# Patient Record
Sex: Male | Born: 1955 | Race: White | Hispanic: No | Marital: Married | Smoking: Current every day smoker
Health system: Southern US, Community
[De-identification: ages and names within clinical notes are randomized; demographics above are authoritative.]

## PROBLEM LIST (undated history)

## (undated) DIAGNOSIS — M869 Osteomyelitis, unspecified: Secondary | ICD-10-CM

## (undated) DIAGNOSIS — I10 Essential (primary) hypertension: Secondary | ICD-10-CM

## (undated) DIAGNOSIS — M549 Dorsalgia, unspecified: Secondary | ICD-10-CM

## (undated) DIAGNOSIS — R51 Headache: Secondary | ICD-10-CM

## (undated) DIAGNOSIS — E785 Hyperlipidemia, unspecified: Secondary | ICD-10-CM

## (undated) HISTORY — DX: Hyperlipidemia, unspecified: E78.5

## (undated) HISTORY — PX: SPINE SURGERY: SHX786

## (undated) HISTORY — PX: CARDIAC CATHETERIZATION: SHX172

---

## 2000-04-19 ENCOUNTER — Encounter: Admission: RE | Admit: 2000-04-19 | Discharge: 2000-07-18 | Payer: Self-pay | Admitting: Emergency Medicine

## 2003-03-21 ENCOUNTER — Encounter: Payer: Self-pay | Admitting: Emergency Medicine

## 2003-03-22 ENCOUNTER — Inpatient Hospital Stay (HOSPITAL_COMMUNITY): Admission: EM | Admit: 2003-03-22 | Discharge: 2003-03-25 | Payer: Self-pay | Admitting: Emergency Medicine

## 2003-03-31 ENCOUNTER — Observation Stay (HOSPITAL_COMMUNITY): Admission: EM | Admit: 2003-03-31 | Discharge: 2003-04-01 | Payer: Self-pay | Admitting: Emergency Medicine

## 2003-03-31 ENCOUNTER — Encounter: Payer: Self-pay | Admitting: Emergency Medicine

## 2003-10-06 ENCOUNTER — Emergency Department (HOSPITAL_COMMUNITY): Admission: EM | Admit: 2003-10-06 | Discharge: 2003-10-06 | Payer: Self-pay | Admitting: Emergency Medicine

## 2003-10-24 ENCOUNTER — Emergency Department (HOSPITAL_COMMUNITY): Admission: EM | Admit: 2003-10-24 | Discharge: 2003-10-25 | Payer: Self-pay | Admitting: Emergency Medicine

## 2003-12-09 ENCOUNTER — Emergency Department (HOSPITAL_COMMUNITY): Admission: EM | Admit: 2003-12-09 | Discharge: 2003-12-09 | Payer: Self-pay | Admitting: Emergency Medicine

## 2004-07-10 ENCOUNTER — Emergency Department (HOSPITAL_COMMUNITY): Admission: EM | Admit: 2004-07-10 | Discharge: 2004-07-10 | Payer: Self-pay | Admitting: Emergency Medicine

## 2004-08-23 ENCOUNTER — Encounter: Admission: RE | Admit: 2004-08-23 | Discharge: 2004-08-23 | Payer: Self-pay | Admitting: Emergency Medicine

## 2004-11-18 ENCOUNTER — Emergency Department (HOSPITAL_COMMUNITY): Admission: EM | Admit: 2004-11-18 | Discharge: 2004-11-18 | Payer: Self-pay | Admitting: Emergency Medicine

## 2005-01-04 ENCOUNTER — Emergency Department (HOSPITAL_COMMUNITY): Admission: EM | Admit: 2005-01-04 | Discharge: 2005-01-04 | Payer: Self-pay | Admitting: Emergency Medicine

## 2005-04-21 ENCOUNTER — Encounter: Admission: RE | Admit: 2005-04-21 | Discharge: 2005-04-21 | Payer: Self-pay | Admitting: Emergency Medicine

## 2005-07-05 ENCOUNTER — Emergency Department (HOSPITAL_COMMUNITY): Admission: EM | Admit: 2005-07-05 | Discharge: 2005-07-05 | Payer: Self-pay | Admitting: Emergency Medicine

## 2005-07-05 ENCOUNTER — Ambulatory Visit: Payer: Self-pay | Admitting: Psychiatry

## 2005-07-05 ENCOUNTER — Inpatient Hospital Stay (HOSPITAL_COMMUNITY): Admission: RE | Admit: 2005-07-05 | Discharge: 2005-07-08 | Payer: Self-pay | Admitting: Psychiatry

## 2007-11-24 ENCOUNTER — Emergency Department (HOSPITAL_COMMUNITY): Admission: EM | Admit: 2007-11-24 | Discharge: 2007-11-24 | Payer: Self-pay | Admitting: Emergency Medicine

## 2007-12-03 ENCOUNTER — Ambulatory Visit (HOSPITAL_COMMUNITY): Admission: RE | Admit: 2007-12-03 | Discharge: 2007-12-03 | Payer: Self-pay | Admitting: Neurosurgery

## 2008-08-17 ENCOUNTER — Encounter: Admission: RE | Admit: 2008-08-17 | Discharge: 2008-08-17 | Payer: Self-pay | Admitting: Neurosurgery

## 2008-08-19 ENCOUNTER — Encounter: Admission: RE | Admit: 2008-08-19 | Discharge: 2008-08-19 | Payer: Self-pay | Admitting: Neurosurgery

## 2008-09-03 ENCOUNTER — Emergency Department (HOSPITAL_BASED_OUTPATIENT_CLINIC_OR_DEPARTMENT_OTHER): Admission: EM | Admit: 2008-09-03 | Discharge: 2008-09-03 | Payer: Self-pay | Admitting: Emergency Medicine

## 2009-04-23 ENCOUNTER — Encounter: Admission: RE | Admit: 2009-04-23 | Discharge: 2009-04-23 | Payer: Self-pay | Admitting: Neurosurgery

## 2011-04-05 NOTE — Op Note (Signed)
NAMEASSER, DAIL                 ACCOUNT NO.:  [id number]   MEDICAL RECORD NO.:  [id number]          PATIENT TYPE:  OIB   LOCATION:  3528                         FACILITY:  [facility]   PHYSICIAN:  Kary Kos, M.D.        DATE OF BIRTH:  1956/01/27   DATE OF PROCEDURE:  12/03/2007  DATE OF DISCHARGE:                               OPERATIVE REPORT   PREOPERATIVE DIAGNOSIS:  Right S1 radiculopathy from ruptured disk L5-S1  right.   PROCEDURE:  Lumbar laminectomy, microdiskectomy L5-S1.  Microscope  dissection of the right S1 nerve root.   SURGEON:  Kary Kos, M.D.   ASSISTANT:  Eustace Moore, MD.   ANESTHESIA:  General endotracheal.   HISTORY OF PRESENT ILLNESS:  The patient is a 55 year old gentleman who  presented with progress worsening back and right leg pain radiating down  the back of his foot to __________  the bottom of his foot __________  MRI scan showed a large ruptured disk L5-S1 causing severe spinal  stenosis and predominantly rightward compression although a large part  of the disk was central.  The patient denied any left leg pain and  failed all forms of conservative treatment.  It was recommended  laminectomy microdiskectomy.  Risks and benefits of operation were  explained to patient who understands and agreed to proceed forward.  The  patient was brought to the operating room was induced under anesthesia,  placed prone on Wilson frame.  The back was prepped and draped usual  sterile fashion.  Preop x-ray used to localize appropriate level.  After  infiltrating __________  lidocaine with epinephrine midline incision  made, Bovie electrocautery used to dissect down.  Subperiosteal  dissection carried out __________  lamina on the right at L5 and S1.  Intraoperative x-ray confirmed localization of the transitional level so  attention was taken one interspace above this  and then using high speed  drill the inferior aspect lamina L5, medial facet complex, superior  aspect of S1 was drilled down and then the 2 and 3 mm Kerrison punches  were used to extend the laminotomy and __________  ligamentum flavum  exposing thecal sac.  At this point the operating microscope was prepped  and draped brought in for microscopic illumination and ligamentum flavum  was removed.  The undersurface of lateral gutter was underbitten  identifying the right S1 nerve root.  Then using 4 Penfield the  __________  very large disk herniation stilled contained in ligament.  This small stab incision was made atop the ligament and a large free  fragment of disk immediately expressed under pressure and delivered  itself up into the laminotomy defect.  This came out in three large  pieces, this immediately removal of third piece thecal sac centrally and  the S1 nerve root was decompressed.  The annulotomy was then extended.  Interspace was appropriately cleaned out with pituitary rongeurs.  Epstein curette was used to scrape the central disk down.  Several more  fragments removed from __________  compartment and at the end of  diskectomy __________  thecal  sac and right S1 nerve root.  Wound was  copiously irrigated and meticulous hemostasis was maintained.  Gelfoam  was overlaid on top of  dura and muscle and fascia reapproximated with __________  interrupted  Vicryl and skin closed with running 4-0 subcuticular.  Benzoin and Steri-  Strips applied.  Patient went to recovery room in stable condition.  End  of case, needle and sponge count correct.           ______________________________  Kary Kos, M.D.     GC/MEDQ  D:  12/03/2007  T:  12/03/2007  Job:  [id number]

## 2011-04-08 NOTE — Discharge Summary (Signed)
NAME:  Fernando Anthony, Fernando Anthony NO.:  [id number]   MEDICAL RECORD NO.:  [id number]                   PATIENT TYPE:  INP   LOCATION:  2018                                 FACILITY:  [facility]   PHYSICIAN:  Dola Argyle, M.D.                  DATE OF BIRTH:  December 07, 1955   DATE OF ADMISSION:  03/22/2003  DATE OF DISCHARGE:  03/25/2003                           DISCHARGE SUMMARY - REFERRING   DISCHARGE DIAGNOSES:  1. Noncritical coronary artery disease.     a. Reason for hospitalization: Chest pain.     b. Cardiac catheterization this admission revealing left main normal,        left anterior descending artery normal with a tiny first diagonal        branch with 70 to 80% ostial narrowing in the LAO caudal view only,        circumflex free of critical disease, right coronary artery normal.     c. Left ventriculogram revealed mild hypokinesis with probable left        ventricular hypertrophy and ejection fraction 54%.  No significant        mitral regurgitation.  2. Diabetes mellitus, type 1.  3. 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  4. Strong family history for coronary artery disease.  5. Tobacco abuse.   PROCEDURES:  Cardiac catheterization by Loretha Brasil. Lia Foyer, M.D. (see results  noted above.)   HOSPITAL COURSE:  This 55 year old patient of Dr. Jake Michaelis was admitted  through the emergency room at [facility] on 03/22/2003 in the early  morning hours for complaints of chest pain.  The patient had multiple  cardiac risk factors including diabetes, smoking, and strong family history  for coronary artery disease.  He was admitted and placed on aspirin,  Lovenox, beta blocker, and nitrates.  He ruled out for myocardial  infarction.  He remained stable throughout the weekend.  He went for cardiac  catheterization on the morning of 03/24/2003.  Please see Dr. Maren Beach  dictated note for complete details.  Results are noted above.  He tolerated  procedure  well and had no immediate complications.   A 2-D echocardiogram was ordered to further assess possible LVH.  This study  was performed just prior to discharge, and the results are pending at the  time of this dictation.   The patient's hemoglobin A1C returned markedly elevated at 10.4.  His  triglyceride level was 705 on his lipid panel.  Total cholesterol was 187,  HDL 28, and the LDL was not calculated secondary to the high triglycerides  level.  Dr. Lia Foyer saw the patient on 03/25/2003 and felt he could be discharged to  home.   The patient had been started on Lopressor 25 mg twice daily upon admission.  Dr. Lia Foyer did try to put the patient on Altace, but Altace had to be held  secondary to low blood pressure.  As an outpatient, if the patient's blood  pressure will allow, Altace can be restarted.   The patient has been counseled by diabetes coordinator at [facility].  They will try to set him up with further outpatient diabetes  education.  The patient's Glucophage will be restarted on May 5.  He has  been instructed to follow up with his primary care physician soon for  further management of diabetes as well as close followup on triglyceride  level.   LABORATORY DATA:  White count 8000, hemoglobin 15.6, hematocrit 45.5,  platelet count 163,000.  D-dimer less than 0.22.  INR at admission 0.9.  Sodium 139, potassium 4.3, chloride 103, CO2 29, glucose 186, BUN 12,  creatinine 0.9.  Total bilirubin 0.5, alkaline phosphatase 58, AST 23, ALT  30, total protein 6.8, albumin 4, calcium 9.  Cardiac enzymes negative x 3.  Hemoglobin A1C and lipid panel as noted above.  TSH 2.240.   Admission chest x-ray reportedly normal per the notes.   DISCHARGE MEDICATIONS:  1. Lopressor 25 mg twice daily.  2. Coated aspirin 325 mg twice daily.  3. Glucophage 1000 mg twice daily, to be restarted on Wednesday, May 5.  4. Glipizide 10 mg 2 tablets daily.  5. Xanax 0.5 mg 1/2 to 1 tablet  3 times a day as needed.  6. Nitroglycerin p.r.n. chest pain.  7. Lipitor 20 mg q.h.s.   1. Pain management:  Tylenol as needed for groin pain.  2. Nitroglycerin as needed for chest pain.  The patient is to call 911 or     our office with recurrent chest pain.   ACTIVITY:  No driving, heavy lifting, exertion, return to work for two days.  Slowly advance as tolerated.   DIET:  Low-fat, low-sodium, diabetic diet.   WOUND CARE:  The patient is to call our office in [facility] for any  concerns over groin swelling, bleeding, or bruising.   SPECIAL INSTRUCTIONS:  The patient has been advised to stop smoking, and he  is willing to try this.  He will be using Xanax to help with this.   FOLLOW UP:  The patient has been advised he needs close followup for his  blood sugars and his hypertriglyceridemia.  He has been asked to follow up  with Dr. Jake Michaelis later this week or early next week.  He will follow up with  Dr. Ron Parker on Friday, May 21, at 2:30 p.m. in our office in [place].     Richardson Dopp, P.A.                        Dola Argyle, M.D.    SW/MEDQ  D:  03/25/2003  T:  03/25/2003  Job:  [id number]   cc:   Harden Mo, M.D.  [address]  [place]  [place] [address]  Fax: [phone]

## 2011-04-08 NOTE — Discharge Summary (Signed)
Fernando Anthony, Fernando Anthony NO.:  [id number]   MEDICAL RECORD NO.:  [id number]          PATIENT TYPE:  IPS   LOCATION:  0304                          FACILITY:  [facility]   PHYSICIAN:  Carlton Adam, M.D.      DATE OF BIRTH:  1956-08-18   DATE OF ADMISSION:  07/05/2005  DATE OF DISCHARGE:  07/08/2005                                 DISCHARGE SUMMARY   CHIEF COMPLAINT/HISTORY OF PRESENT ILLNESS:  This was the first admission to  [facility] for this 55 year old married white male  voluntarily admitted.  He was all right for 6 months after detox at [facility] on  November 2005.  He slowly returned to drinking, thinking he could control  it.  The drinking has gradually escalated.  It has been out of control for  the last 3 months, drinking a lot of beer or a fifth of liquor daily.  He  tried to stop, got shakes and got agitated, diarrhea and vomiting, some  blackouts last August 15.   PAST PSYCHIATRIC HISTORY:  First time in [facility] with prior  detox at [place] in November 2005.   ALCOHOL AND DRUG HISTORY:  As already stated.  Persistent use of alcohol  after his last relapse, escalating in the last 3 months.   PAST MEDICAL HISTORY:  Type 2 diabetes mellitus, uncontrolled.   MEDICATIONS:  1.  Xanax 0.5 three times a day.  2.  Metformin.  3.  Glucophage.  4.  Lisinopril.  5.  Previously on Effexor but was not taking.   PHYSICAL EXAMINATION:  Performed and failed to show any acute findings.   LABORATORY WORKUP:  Blood chemistry, glucose 163.  Liver enzymes, SGOT 20,  SGPT 24.  Hemoglobin A1c 13 and TSH was 1.984.   MENTAL STATUS EXAM:  Reveals an alert, cooperative male, pleasant, flushed.  Speech normal rate, tempo and production.  Mood euthymia.  Affect broad.  Thought process logical, coherent and relevant.  No suicidal ideas, no  homicidal ideas, no hallucinations, no delusions.  Cognition well preserved.   ADMISSION DIAGNOSES:  AXIS I:   Alcohol dependence.  AXIS II:  No diagnosis.  AXIS III:  Type 2 diabetes mellitus, uncontrolled.  AXIS IV:  Moderate.  AXIS V:  Upon admission 35, highest global assessment of function in the  last year 65-70.   COURSE IN HOSPITAL:  Patient was admitted.  He was started in individual and  group psychotherapy.  His medications were resumed.  He was detoxified with  Librium.  He was placed on Glipizide ER 10 mg twice a day and Metformin 500  mg 2 in the morning and 1 at night.  Avandia 4 mg daily.  Due to his  persistently uncontrolled diabetes and in communication with his primary  physician, he was started on insulin.  He did endorse that he had a hard  time with alcohol use.  Endorsed trying to control his drinking but was not  able to do so.  His alcohol use escalated out of control, craving, coping  with his  wife.  Also aware of the effect of his drinking on his daughter.  They fight a lot in front of her.  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  Wife had to go to work when all these years had been a stay-  home mom.  He lost his role as the [employer].  Endorsed he craves alcohol.  He was willing to start Revia which was started at 25 mg per day.  He was  still pretty much saying that he thought he could do it by just going to AA,  by getting a sponsor.  He endorsed that this has worked for him before.  He  is still dealing with the other losses he has had.  Family session with the  wife.  Her concerns were noticed, and he heard her.  On July 08, 2005, he  was in full contact with reality.  There were no homicidal ideas, no  suicidal ideas, no hallucination, no delusions.  He was fully detoxed.  He  was considering coming to [facility], but he would go to AA and he would pursue  the Revia.  Overall his mood was improved.   Committed to abstinence.  He  understanding the effect that alcohol had not only on his emotional and  spiritual but also physical health, as well as the effect that it had on his  family.   DISCHARGE DIAGNOSES:  AXIS I:  Alcohol dependence.  Depressive disorder, not  otherwise specified.  AXIS III:  No diagnosis.  AXIS III:  Type 2 diabetes mellitus.  AXIS IV:  Moderate.  AXIS V:  Upon discharge global assessment of function 55.   DISCHARGE MEDICATIONS:  1.  Librium 25 mg 1 at 6:00 p.m. August 18, then 1 at 9:00 on August 19,      then discontinue.  2.  Avandia 4 mg twice a day.  3.  Glucophage 500 mg 3 twice a day.  4.  Glucotrol XL 10 mg twice a day.  5.  Prinivil 10 mg daily.  6.  Revia 50 mg daily.  7.  Lantus insulin 20 units at bedtime.   FOLLOW UP:  Dr. Jake Michaelis and [facility], as well as going to [employer]  and get a sponsor.      Carlton Adam, M.D.  Electronically Signed     IL/MEDQ  D:  08/09/2005  T:  08/10/2005  Job:  [id number]

## 2011-04-08 NOTE — Discharge Summary (Signed)
NAME:  Fernando Anthony, Fernando Anthony                           ACCOUNT NO.:  [id number]   MEDICAL RECORD NO.:  [id number]                   PATIENT TYPE:  OBV   LOCATION:  [place]                                 FACILITY:  [facility]   PHYSICIAN:  Loretha Brasil. Lia Foyer, M.D.             DATE OF BIRTH:  [birth year]   DATE OF ADMISSION:  03/31/2003  DATE OF DISCHARGE:  04/01/2003                           DISCHARGE SUMMARY - REFERRING   BRIEF HISTORY:  This is a 55 year old male followed by Dr. Dola Argyle in  our practice with a history of metabolic syndrome recently discharged from  [facility] on 03/25/03 following an admission for chest pain. The  patient presented again on 03/31/03 with recurrent chest pain. He was  initially admitted on 03/22/03 with chest pain. He had mild EKG changes. He  ruled out for a MI. The patient has multiple cardiac risk factors. He  eventually underwent cardiac catheterization on 03/24/03 performed by Dr.  Lia Foyer. This revealed an ejection fraction of 54% with mild hypokinesis and  LVH. The left main coronary artery was normal. The LAD was normal. There was  a tiny diagonal that had a 70 to 80% ostial lesion seen only in one view.  The left circumflex was normal. The RCA was normal. Medical therapy was  recommended with aggressive risk factor management including smoking  cessation. Altace was started but had to be held secondary to hypotension.   Prior to this most recent admission, the patient had done well without  recurrent pain until approximately 11 p.m. on the night of admission when he  developed sudden onset of left sided chest pain while watching television.  He presented to the emergency room and was admitted for further evaluation.   PAST MEDICAL HISTORY:  1. Significant for chest pain.  2. Diabetes mellitus poorly controlled. Hemoglobin A1c that was 10.4.  3. He has a history of hypertriglyceridemia.  4. History of hypertension.  5. History of tobacco abuse.  6. History of anxiety.   ALLERGIES:  No known drug allergies.   SOCIAL HISTORY:  The patient works as a Secondary school teacher. He lives in [place]  with his wife. He smokes one pack of cigarettes per day. He drinks two to  four beers per day.   FAMILY HISTORY:  The patient's father has an extensive history of coronary  artery disease.   HOSPITAL COURSE:  As noted, this patient was readmitted to [facility] with recurrent chest pain on Mar 31, 2003 through the emergency  department. His cardiac enzymes were found to be negative. He was scheduled  for exercise Cardiolite that was performed on 04/01/03. The Cardiolite was  interrupted as negative for ischemia with an ejection fraction of 58%. This  was discussed with Dr. Lia Foyer who had seen the patient during his stay, and  arrangements were made to discharge the patient in stable and improved  condition with plans  for outpatient followup with Dr. Ron Parker.   LABORATORY DATA:  A CBC on May 11 revealed a hemoglobin of 14, hematocrit  41.6, WBCs 8.5 thousand, platelets 161,000. A chemistry profile on 5/10  revealed BUN 8, creatinine 0.7, potassium 3.7, glucose 132. A PTT was 76, an  INR was 1.0. Cardiac enzymes were negative x3. A D-dimer was 0.26. A lipid  profile revealed cholesterol 152, triglycerides 358, HDL 38, LDL was 42.  Hepatitis C antibody was negative. Stool was negative for blood. Chest x-ray  showed no active disease although there was suboptimal inspiration.   DISCHARGE MEDICATIONS:  The patient was discharged on the same medications  he was on prior to admission. These included:  1.  Lopressor 25 mg b.i.d.  1. Aspirin 325 mg daily.  2. Lipitor 20 mg daily.  3. Glucophage 1,000 mg b.i.d.  4. Glipizide 20 mg daily.  5. Xanax p.r.n.  6. Nitroglycerin p.r.n.   The patient's activity is to be as tolerated. He was told he could return to  work. He is to be on a low fat diabetic diet. He is to follow up with Dr.  Ron Parker Friday  May 21 at 4:15. He was to follow up with Dr. Jake Michaelis within the  next several weeks.   PROBLEM LIST AT THE TIME OF DISCHARGE:  1. Chest pain, myocardial infarction ruled out.  2. Negative exercise Cardiolite performed 04/01/03, EF 58%.  3. History of cardiac catheterization performed 03/24/03. Please see results     as noted above. Minimal disease. Medical therapy recommended.  4. Diabetes mellitus.  5. Dyslipidemia.  6. Hypertension.  7. Tobacco abuse.  8. Anxiety.     Mikey Bussing, P.A. [facility]                  Thomas D. Lia Foyer, M.D.    DR/MEDQ  D:  04/01/2003  T:  04/02/2003  Job:  [id number]   cc:   Harden Mo, M.D.  [address]  [place]  [place] [address]  Fax: [phone]

## 2011-04-08 NOTE — Cardiovascular Report (Signed)
NAME:  Fernando Anthony, Fernando Anthony                           ACCOUNT NO.:  [id number]   MEDICAL RECORD NO.:  [id number]                   PATIENT TYPE:  INP   LOCATION:  2018                                 FACILITY:  [facility]   PHYSICIAN:  Loretha Brasil. Lia Foyer, M.D.             DATE OF BIRTH:  06/13/1956   DATE OF PROCEDURE:  03/24/2003  DATE OF DISCHARGE:                              CARDIAC CATHETERIZATION   INDICATIONS FOR PROCEDURE:  The patient is a 55 year old gentleman who has  diabetes, he also has hypertension.  He has not been seen in about 2-1/2  years, but recently presented with Dr. Jake Michaelis, and these problems have been  put under careful management.  He presented with chest pain with some mild  electrocardiographic abnormalities.  He has been subsequently referred for  cardiac catheterization.  Also, triglycerides are noted to be 705, and HDL  28.   LABORATORY DATA:  Enzymes were negative for myocardial infarction.  Hemoglobin A1C is 10.4.   PROCEDURE:  1. Left heart catheterization.  2. Selective coronary arteriography.  3. Selective left ventriculography.   DESCRIPTION OF PROCEDURE:  The procedure was performed from the right  femoral artery using 6 French catheters.  The patient tolerated the  procedure well.  There were no complications.  He was taken to the holding  area in satisfactory clinical condition.   HEMODYNAMIC DATA:  1. The central aortic pressure was 140/87, mean 110.  2. Left ventricle 139/17.  3. No gradient on pullback across the aortic valve.   ANGIOGRAPHIC DATA:  1. Ventriculography was performed the RAO projection.  There was mild global     hypokinesis.  There was probable left ventricular hypertrophy.  Ejection     fraction was calculated at 54%.  There was not significant mitral     regurgitation.  2. The left main coronary artery was free of critical disease.  3. The left anterior descending artery coursed to the apex.  The left     anterior  descending itself appeared to be smooth throughout, wrapped the     apical tip, and provided an inferior wall segment.  There was a large     second diagonal branch.  There was a tiny first diagonal branch that had     about 70 to 80% ostial narrowing seen in the LAO caudal view only.  4. The circumflex provided three marginal branches, all of which appeared to     be free of critical disease.  The vessel terminated as two tiny posterior     lateral branches.  No high-grade areas of focal stenosis were noted in     the circumflex system.  5. The right coronary artery consisted predominately of a single posterior     descending branch with a tiny posterior lateral branch.  The right     coronary artery appeared to be smooth throughout without significant  focal narrowing.   CONCLUSIONS:  1. Mild global hypokinesis with probable left ventricular hypertrophy,     possibly from uncontrolled hypertension.  2. Ostial narrowing of the tiny first diagonal branch with otherwise no     evidence of high-grade obstructive disease.  3. Non-insulin dependent diabetes mellitus.  4. Hypertension.  5. Tobacco use.  6. Dyslipidemia, compatible with a __________ metabolic syndrome.   DISPOSITION:  1. Recommend discontinuation of smoking.  2. Optimization of hemoglobin A1C.  3. Careful attention to hypertension control.  4. Lifestyle counseling.  5. Close followup with his primary care physician.  6. A 2-D echocardiography.  7. Follow up with Dr. Ron Parker.                                               Loretha Brasil. Lia Foyer, M.D.    TDS/MEDQ  D:  03/24/2003  T:  03/24/2003  Job:  [id number]   cc:   Harden Mo, M.D.  [address]  [place]  [place] [address]  Fax: [phone]   Dola Argyle, M.D.   [facility]

## 2011-04-08 NOTE — H&P (Signed)
NAME:  Fernando Anthony, Fernando Anthony NO.:  [id number]   MEDICAL RECORD NO.:  [id number]                   PATIENT TYPE:  OBV   LOCATION:  [id number]                                 FACILITY:  [facility]   PHYSICIAN:  Glori Bickers, M.D. [facility]          DATE OF BIRTH:  1956/02/23   DATE OF ADMISSION:  03/31/2003  DATE OF DISCHARGE:                                HISTORY & PHYSICAL   PRIMARY CARE PHYSICIAN:  Harden Mo, M.D.   CARDIOLOGIST:  Dola Argyle, M.D.   HISTORY OF PRESENT ILLNESS:  The patient is a 55 year old white male with a  history of metabolic syndrome recently discharged from [facility] on Mar 25, 2003, after admission for chest pain who  presents with recurrent chest pain.   He was admitted on Mar 22, 2003, with chest pain and mild EKG changes.  He  ruled out for myocardial infarction.  Given his multiple cardiac risk  factors, he underwent left heart catheterization on Mar 24, 2003, by Dr.  Bing Quarry.  This revealed left ventricular ejection fraction of 54%  with mild global hypokinesis thought secondary to LVH.  His left main was  normal.  His LAD was normal with a tiny D1 which had a 70 to 80 ostial  lesion seen only on one view.  His left circumflex was normal and his RCA  was normal.  It was recommended medical therapy with aggressive risk factor  management including smoking cessation.  Altace was started but had to be  held secondary to hypotension.   For the past few days he had been quite well without recurrent chest pain  and was able to resume all his normal activities.  However, tonight at 11  p.m. he was watching TV when he had the sudden onset of sharp left-sided  chest pain which waxed and waned and he came to the ED.  There, he had no  relief with multiple sublingual nitroglycerin.  EKG and cardiac markers were  negative and his pain was relieved completely with 1 mg of Dilaudid.   REVIEW OF SYSTEMS:  He denies  shortness of breath, abdominal pain, melena,  bright red blood per rectum, fevers, chills, palpitations, or leg swelling.   PAST MEDICAL HISTORY:  1. Chest pain as per HPI.  2. Diabetes poorly controlled with a hemoglobin A1C of 10.4 several days     ago.  3. Hyperlipidemia with total cholesterol of 184, triglycerides 705, and HDL     of 28.  LDL was not able to be calculated given his hypertriglyceridemia.  4. Hypertension.  5. Tobacco abuse.  6. Anxiety.   MEDICATIONS:  1. Lopressor 25 b.i.d.  2. Aspirin 325.  3. Lipitor 20.  4. Glucophage 1000 b.i.d.  5. Glipizide 20 daily.  6. Xanax and nitroglycerin p.r.n.   ALLERGIES:  No known drug allergies.   SOCIAL HISTORY:  He  works as a Insurance account manager homes.  He lives  in [place], [place], with his wife.   HABITS:  Tobacco is one pack per day ongoing.  Alcohol:  He has two to four  beers per day.  He denies any illicit drug use.   FAMILY HISTORY:  His father has an extensive history of CAD and is cared for  by Dr. Bing Quarry.   PHYSICAL EXAMINATION:  GENERAL APPEARANCE:  He is in no acute distress.  VITAL SIGNS:  He is afebrile.  Blood pressure is 107/60 with a heart rate of  80, saturation 98% on room air.  HEENT:  Sclerae are anicteric.  NECK:  Supple.  There is no JVD.  Carotids are 2+ bilaterally without  bruits.  CHEST:  He has mild chest pain on palpation of his anterior chest wall but  this was different from his symptoms that brought him in.  LUNGS:  Clear to auscultation.  CARDIOVASCULAR:  Regular rate and rhythm with normal S1 and S2.  No audible  murmurs, rubs, or gallops.  ABDOMEN:  Benign.  EXTREMITIES:  No clubbing, cyanosis, or edema.  He has a small right groin  hematoma but no bruits.  DPs are 2+ bilaterally.  RECTAL:  He has an empty vault with light brown residue which was sent to  the lab for Hemoccult testing.   LABORATORY DATA:  White count 8.6, hematocrit 45% and platelets  174.  Sodium  140, potassium 3.6, BUN 10, creatinine 0.8, glucose 199.  CK 29, MB 0.6,  troponin I less than 0.01.  PT 13.2, INR 1.0, PTT 31.  Liver panel is normal  except for a mildly elevated ALT of 51.   EKG shows normal sinus rhythm at 98 with a rightward axis and no ischemia.   Chest x-ray shows no acute disease.   ASSESSMENT AND PLAN:  1. This is a 55 year old white male as above with multiple cardiac risk     factors and recent catheterization with minimal coronary artery disease     and a tiny diagonal who presents with atypical chest pain.  I doubt this     pain is cardiac in nature.  However, given his risk factors, will bring     him in for 23-hour observation and rule out myocardial infarction.     Continue on heparin, aspirin, and beta-blocker.  2. Given his increased stress level and alcohol use, it may be reasonable to     consider an esophagogastroduodenoscopy to work-up possible peptic ulcer     disease as the source of his pain.  3. Consider addition of niacin or __________ for his marked     hypertriglyceridemia.  4. Check D-dimer.  5.     Given his elevated albumin, check his BNC panels.  6. Detox prophylaxis given his significant alcohol history.  I stressed to     him the need to stop smoking and limit alcohol use.                                               Glori Bickers, M.D. [facility]    DB/MEDQ  D:  03/31/2003  T:  04/01/2003  Job:  [id number]   cc:   Harden Mo, M.D.  [place] [address]  [place]  [place] [address]  Fax: [phone]   Dola Argyle,  M.D.  

## 2011-05-09 ENCOUNTER — Ambulatory Visit (HOSPITAL_BASED_OUTPATIENT_CLINIC_OR_DEPARTMENT_OTHER): Payer: 59 | Admitting: Physical Medicine & Rehabilitation

## 2011-05-09 ENCOUNTER — Encounter: Payer: 59 | Attending: Physical Medicine & Rehabilitation

## 2011-05-09 DIAGNOSIS — M961 Postlaminectomy syndrome, not elsewhere classified: Secondary | ICD-10-CM

## 2011-05-09 DIAGNOSIS — Z79899 Other long term (current) drug therapy: Secondary | ICD-10-CM | POA: Insufficient documentation

## 2011-05-09 DIAGNOSIS — Z794 Long term (current) use of insulin: Secondary | ICD-10-CM | POA: Insufficient documentation

## 2011-05-09 DIAGNOSIS — IMO0002 Reserved for concepts with insufficient information to code with codable children: Secondary | ICD-10-CM

## 2011-05-09 DIAGNOSIS — M79609 Pain in unspecified limb: Secondary | ICD-10-CM | POA: Insufficient documentation

## 2011-05-09 DIAGNOSIS — E119 Type 2 diabetes mellitus without complications: Secondary | ICD-10-CM | POA: Insufficient documentation

## 2011-05-16 ENCOUNTER — Encounter: Payer: 59 | Admitting: Physical Medicine & Rehabilitation

## 2011-05-19 ENCOUNTER — Encounter: Payer: 59 | Admitting: Physical Medicine & Rehabilitation

## 2011-05-27 NOTE — Consult Note (Signed)
REASON FOR VISIT:  Initial visit for right lower extremity pain.  Consult requested by Dr. Kary Kos, MD  HISTORY:  A 55 year old male with a chief complaint of back and right lower extremity pain onset in 2008.  No trauma.  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  His pain levels at a 5/10; described as sharp, stabbing, constant tingling, aching and the tingling is more in the lower extremity, the sharp and stabbing pain is in the back.  His pain is increasing with activity to a 7.  His pain is relatively better in the morning and progresses during the day.  His sleep is poor related to pain.  His pain is worse with walking, bending, sitting; improves with rest and medications.  When he was taking oxycodone, his pain was improved to the point where he can still do a stretching exercises.  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  He has weakness, numbness, tingling, spasms and constipation on review of systems.  His past history is significant for diabetes as well as hypertension.  His primary care physician, Dr. Elayne Snare.  MEDICATIONS:  Include lisinopril, aspirin, simvastatin, NovoLog and Lantus.  ALLERGIES:  None known.  SOCIAL HISTORY:  Married, lives with his wife who smokes half pack per day.  Alcohol use is 6 beers per month.  FAMILY HISTORY:  Heart disease, lung disease.  PAST SURGICAL HISTORY:  Lumbar laminectomy and microdiskectomy L5-S1 on December 03, 2007.  Well-developed, well-nourished male, in no acute stress.  Orientation x3.  Affect is alert.  Gait is normal.  He has mild problems with toe walk, heel walk on the right side, but cannot complete it without the loss of balance.  He has no evidence of lower extremity muscle atrophy.  He has decreased sensation to pinprick in  the right S1 as well as right L3 dermatomal distribution.  His deep tendon reflexes are decreased bilaterally.  His back has some tenderness in the right PSIS.  His flexion is 50%, extension is 0-25%, but he states it hurts more to bent forward than to bend backwards.  His hip range of motion is full bilaterally.  However, it does cause some back pain on the right side only.  Upper extremity strength, range of motion and sensation are all normal.  Neck range of motion is normal.  IMPRESSION: 1. 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 2. Diabetes.  He has been on Lyrica in the past for this, but was not     particularly helpful for his right lower extremity pain.  RECOMMENDATIONS:  We discussed the multimodal treatment approach including trial of epidural injections of the left S1 transforaminal combined with tramadol 50 mg t.i.d. and nortriptyline 10 mg nightly.  We will try the nonnarcotic approach and avoid narcotic analgesics if possible.  We discussed this with the patient.  He agrees with this plan.  Also, once his pain is under better control, we will need to reinstitute his stretching and exercise program that is PT has given to him in the past. Information given regarding epidural injections.     Charlett Blake, M.D.    AEK/MedQ D:05/09/2011 12:58:29  T:05/10/2011 00:06:06  Job #:  [id number]  cc:   Kary Kos, M.D. Fax:  [phone]  Electronically Signed by Alysia Penna M.D. on 05/27/2011 10:25:45 AM

## 2011-06-20 ENCOUNTER — Encounter: Payer: 59 | Attending: Physical Medicine & Rehabilitation

## 2011-06-20 ENCOUNTER — Encounter (HOSPITAL_BASED_OUTPATIENT_CLINIC_OR_DEPARTMENT_OTHER): Payer: 59 | Admitting: Physical Medicine & Rehabilitation

## 2011-06-20 DIAGNOSIS — M79609 Pain in unspecified limb: Secondary | ICD-10-CM | POA: Insufficient documentation

## 2011-06-20 DIAGNOSIS — M961 Postlaminectomy syndrome, not elsewhere classified: Secondary | ICD-10-CM | POA: Insufficient documentation

## 2011-06-20 DIAGNOSIS — IMO0002 Reserved for concepts with insufficient information to code with codable children: Secondary | ICD-10-CM | POA: Insufficient documentation

## 2011-06-20 DIAGNOSIS — Z794 Long term (current) use of insulin: Secondary | ICD-10-CM | POA: Insufficient documentation

## 2011-06-20 DIAGNOSIS — E119 Type 2 diabetes mellitus without complications: Secondary | ICD-10-CM | POA: Insufficient documentation

## 2011-06-20 DIAGNOSIS — Z79899 Other long term (current) drug therapy: Secondary | ICD-10-CM | POA: Insufficient documentation

## 2011-06-20 NOTE — Procedures (Signed)
Fernando Anthony, Fernando Anthony                 ACCOUNT NO.:  [id number]  MEDICAL RECORD NO.:  [id number]           PATIENT TYPE:  O  LOCATION:  [facility]                          FACILITY:  [facility]  PHYSICIAN:  Charlett Blake, M.D.DATE OF BIRTH:  05-Jun-1956  DATE OF PROCEDURE:  06/20/2011 DATE OF DISCHARGE:                              OPERATIVE REPORT  Right S1 transforaminal epidural steroid injection under fluoroscopic guidance.  PROCEDURE:  Listed above.  CHIEF COMPLAINT:  Pain down the right lower extremity to the foot, has undergone L5-S1 diskectomy, laminectomy with only partial effect.  Pain is interfering with his activity and has not responded well to medication such as Lyrica or nortriptyline.  Informed consent was obtained after describing risks and benefits of the procedure with the patient.  These include bleeding, bruising, and infection.  He elects to proceed and has given written consent.  The patient was placed prone on fluoroscopy table.  Betadine prep, sterile drape, time-out for proper side.  Right side was confirmed.  Area was marked, prepped with Betadine, entered with a 25-gauge inch half needle to anesthetize skin and subcu tissue with 2 mL of 1% lidocaine.  Then, a 22-gauge three and half spinal needle was inserted in the right S1 foramen under AP and lateral imaging.  Omnipaque 180 x2 mL demonstrated good epidural spread followed by injection of 1 mL of 10 mg/mL dexamethasone and 2 mL of 1% MPF lidocaine.  The patient tolerated the procedure well.  Postprocedure instructions given.     Charlett Blake, M.D. Electronically Signed    AEK/MEDQ  D:  06/20/2011 14:24:51  T:  06/20/2011 17:00:36  Job:  [id number]

## 2011-07-19 ENCOUNTER — Ambulatory Visit: Payer: 59 | Admitting: Physical Medicine & Rehabilitation

## 2011-07-19 ENCOUNTER — Encounter: Payer: 59 | Attending: Physical Medicine & Rehabilitation

## 2011-07-19 DIAGNOSIS — Z79899 Other long term (current) drug therapy: Secondary | ICD-10-CM | POA: Insufficient documentation

## 2011-07-19 DIAGNOSIS — Z794 Long term (current) use of insulin: Secondary | ICD-10-CM | POA: Insufficient documentation

## 2011-07-19 DIAGNOSIS — IMO0002 Reserved for concepts with insufficient information to code with codable children: Secondary | ICD-10-CM | POA: Insufficient documentation

## 2011-07-19 DIAGNOSIS — M79609 Pain in unspecified limb: Secondary | ICD-10-CM | POA: Insufficient documentation

## 2011-07-19 DIAGNOSIS — M961 Postlaminectomy syndrome, not elsewhere classified: Secondary | ICD-10-CM | POA: Insufficient documentation

## 2011-07-19 DIAGNOSIS — E119 Type 2 diabetes mellitus without complications: Secondary | ICD-10-CM | POA: Insufficient documentation

## 2011-08-10 LAB — BASIC METABOLIC PANEL
BUN: 13
CO2: 30
Calcium: 9.6
Chloride: 96
Creatinine, Ser: 0.74
GFR calc Af Amer: >60
GFR calc non Af Amer: >60
Glucose, Bld: 245 — ABNORMAL HIGH
Potassium: 4.3
Sodium: 134 — ABNORMAL LOW

## 2011-08-10 LAB — CBC
HCT: 49.8
Hemoglobin: 17.2 — ABNORMAL HIGH
MCHC: 34.6
MCV: 96.5
Platelets: 169
RBC: 5.16
RDW: 12.3
WBC: 9.9

## 2012-05-15 ENCOUNTER — Telehealth: Payer: Self-pay | Admitting: *Deleted

## 2012-05-15 NOTE — Telephone Encounter (Signed)
Has appointment on Friday with [facility], is wanting Tramadol refill before then.

## 2012-05-15 NOTE — Telephone Encounter (Signed)
Spoke to Dr. Letta Pate about this and he states pt has to wait until appointment because we haven't seen him in such a long time. Pt aware.

## 2012-05-15 NOTE — Telephone Encounter (Signed)
Error

## 2012-05-15 NOTE — Telephone Encounter (Signed)
Pt hasn't been seen since 05/2011. Will need an appointment before we can refill, pt aware. Diane is to schedule an appointment with Gustavo Lah.

## 2012-05-15 NOTE — Telephone Encounter (Signed)
Tramadol refill

## 2012-05-18 ENCOUNTER — Encounter: Payer: BC Managed Care – PPO | Attending: Physical Medicine & Rehabilitation

## 2012-05-18 ENCOUNTER — Encounter: Payer: Self-pay | Admitting: Physical Medicine & Rehabilitation

## 2012-05-18 ENCOUNTER — Ambulatory Visit (HOSPITAL_BASED_OUTPATIENT_CLINIC_OR_DEPARTMENT_OTHER): Payer: BC Managed Care – PPO | Admitting: Physical Medicine & Rehabilitation

## 2012-05-18 VITALS — BP 148/81 | HR 79 | Resp 16 | Ht 74.0 in | Wt 225.0 lb

## 2012-05-18 DIAGNOSIS — E1121 Type 2 diabetes mellitus with diabetic nephropathy: Secondary | ICD-10-CM | POA: Insufficient documentation

## 2012-05-18 DIAGNOSIS — M961 Postlaminectomy syndrome, not elsewhere classified: Secondary | ICD-10-CM

## 2012-05-18 DIAGNOSIS — R209 Unspecified disturbances of skin sensation: Secondary | ICD-10-CM | POA: Insufficient documentation

## 2012-05-18 DIAGNOSIS — M533 Sacrococcygeal disorders, not elsewhere classified: Secondary | ICD-10-CM

## 2012-05-18 DIAGNOSIS — E119 Type 2 diabetes mellitus without complications: Secondary | ICD-10-CM

## 2012-05-18 DIAGNOSIS — M549 Dorsalgia, unspecified: Secondary | ICD-10-CM | POA: Insufficient documentation

## 2012-05-18 MED ORDER — TRAMADOL HCL 50 MG PO TABS: 100.0000 mg | ORAL_TABLET | Freq: Three times a day (TID) | ORAL | Status: DC | PRN

## 2012-05-18 NOTE — Patient Instructions (Signed)
We will do a lidocaine only right sacroiliac injection next visit in October. Continue the tramadol until that time area you can take Aleve or ibuprofen along with the tramadol as needed  Sacroiliac Joint Dysfunction The sacroiliac joint connects the lower part of the spine (the sacrum) with the bones of the pelvis. CAUSES  Sometimes, there is no obvious reason for sacroiliac joint dysfunction. Other times, it may occur   During pregnancy.   After injury, such as:   Car accidents.   Sport-related injuries.   Work-related injuries.   Due to one leg being shorter than the other.   Due to other conditions that affect the joints, such as:   Rheumatoid arthritis.   Gout.   Psoriasis.   Joint infection (septic arthritis).  SYMPTOMS  Symptoms may include:  Pain in the:   Lower back.   Buttocks.   Groin.   Thighs and legs.   Difficult sitting, standing, walking, lying, bending or lifting.  DIAGNOSIS  A number of tests may be used to help diagnose the cause of sacroiliac joint dysfunction, including:  Imaging tests to look for other causes of pain, including:   MRI.   CT scan.   Bone scan.   Diagnostic injection: During a special x-ray (called fluoroscopy), a needle is put into the sacroiliac joint. A numbing medicine is injected into the joint. If the pain is improved or stopped, the diagnosis of sacroiliac joint dysfunction is more likely.  TREATMENT  There are a number of types of treatment used for sacroiliac joint dysfunction, including:  Only take over-the-counter or prescription medicines for pain, discomfort, or fever as directed by your caregiver.   Medications to relax muscles.   Rest. Decreasing activity can help cut down on painful muscle spasms and allow the back to heal.   Application of heat or ice to the lower back may improve muscle spasms and soothe pain.   Brace. A special back brace, called a sacroiliac belt, can help support the joint  while your back is healing.   Physical therapy can help teach comfortable positions and exercises to strengthen muscles that support the sacroiliac joint.   Cortisone injections. Injections of steroid medicine into the joint can help decrease swelling and improve pain.   Hyaluronic acid injections. This chemical improves lubrication within the sacroiliac joint, thereby decreasing pain.   Radiofrequency ablation. A special needle is placed into the joint, where it burns away nerves that are carrying pain messages from the joint.   Surgery. Because pain occurs during movement of the joint, screws and plates may be installed in order to limit or prevent joint motion.  HOME CARE INSTRUCTIONS   Take all medications exactly as directed.   Follow instructions regarding both rest and physical activity, to avoid worsening the pain.   Do physical therapy exercises exactly as prescribed.  SEEK IMMEDIATE MEDICAL CARE IF:  You experience increasingly severe pain.   You develop new symptoms, such as numbness or tingling in your legs or feet.   You lose bladder or bowel control.  Document Released: 02/03/2009 Document Revised: 10/27/2011 Document Reviewed: 02/03/2009 [facility] Patient Information 2012 [place].

## 2012-05-18 NOTE — Progress Notes (Signed)
  Subjective:    Patient ID: Fernando Anthony, male    DOB: 16-Jul-1956, 56 y.o.   MRN: [id number]  HPI Pain Inventory Average Pain 5 Pain Right Now 5 My pain is constant, sharp and aching  In the last 24 hours, has pain interfered with the following? General activity 3 Relation with others 0 Enjoyment of life 3 What TIME of day is your pain at its worst? Daytime, Evening and Night Sleep (in general) Poor  Pain is worse with: bending and sitting Pain improves with: rest and medication Relief from Meds: 4  Mobility walk without assistance ability to climb steps?  yes do you drive?  yes  Function employed # of hrs/week 40-60  Neuro/Psych numbness tingling spasms  Prior Studies Any changes since last visit?  no  Physicians involved in your care Any changes since last visit?  no   Family History  Problem Relation Age of Onset  . Cancer Mother   . Heart disease Father    History   Social History  . Marital Status: Married    Spouse Name: N/A    Number of Children: N/A  . Years of Education: N/A   Social History Main Topics  . Smoking status: Current Everyday Smoker  . Smokeless tobacco: None  . Alcohol Use: None  . Drug Use: None  . Sexually Active: None   Other Topics Concern  . None   Social History Narrative  . None   Past Surgical History  Procedure Date  . Spine surgery    Past Medical History  Diagnosis Date  . Diabetes mellitus    BP 148/81  Pulse 79  Resp 16  Ht 6\' 2"  (1.88 m)  Wt 225 lb (102.059 kg)  BMI 28.89 kg/m2  SpO2 98%      Review of Systems  Constitutional:       High Blood Sugar Low Blood Sugar   HENT: Negative.   Eyes: Negative.   Respiratory: Negative.   Cardiovascular: Negative.   Gastrointestinal: Negative.   Genitourinary: Negative.   Musculoskeletal: Positive for back pain.  Neurological: Positive for numbness.       Spasms Tingling  Hematological: Negative.   Psychiatric/Behavioral: Negative.    MRI  LUMBAR SPINE WITHOUT AND WITH CONTRAST  Technique: Multiplanar and multiecho pulse sequences of the lumbar  spine were obtained without and with intravenous contrast.  Contrast: 9 ml Multihance  Comparison: 08/17/2008  Findings: S1 is a transitional vertebra. There is no significant  finding at L4-5 or above. The discs are unremarkable. There is  minimal facet degeneration at L3-4 and L4-5. No compressive  stenosis.  At L5-S1, there has been previous partial laminectomy and  discectomy. This shows desiccation and minimal bulging there is no  residual or recurrent disc herniation. There is an expected degree  of epidural fibrosis. No gross neural compression is evident.  There is discogenic edema and enhancement within the vertebral  endplates, a finding that can be associated with back pain. This  is more pronounced towards the left.  IMPRESSION:  Status post decompression and discectomy at L5-S1. No residual or  recurrent disc herniation. No apparent compressive stenosis at  that level. Some discogenic edema and enhancement, particularly  towards the left.  Minor facet degeneration at L3-4 and L4-5.      Objective:   Physical Exam        Assessment & Plan:

## 2012-07-20 ENCOUNTER — Telehealth: Payer: Self-pay | Admitting: Physical Medicine & Rehabilitation

## 2012-07-20 NOTE — Telephone Encounter (Signed)
Please advise 

## 2012-07-20 NOTE — Telephone Encounter (Signed)
Wants Dr to write Rx for Percocet

## 2012-07-20 NOTE — Telephone Encounter (Signed)
My last note ordered a right sacroiliac injection which the pt did not schedule.  Remind patient to schedule injection I see no indication for Percocet based on my last clinic note

## 2012-07-24 NOTE — Telephone Encounter (Signed)
Fernando Anthony did schedule his appointment for the injection. It is scheduled for Oct 24 because he reports that he discussed with you that he works for the [employer] and they are working non stop right now and cannot take off. The tramadol is not working.

## 2012-07-24 NOTE — Telephone Encounter (Signed)
Notified Fernando Anthony.

## 2012-07-24 NOTE — Telephone Encounter (Signed)
May take tylenol 650mg -1000mg  with the tramadol

## 2012-09-13 ENCOUNTER — Ambulatory Visit: Payer: BC Managed Care – PPO | Admitting: Physical Medicine & Rehabilitation

## 2013-04-04 ENCOUNTER — Encounter: Payer: Self-pay | Admitting: Physical Medicine & Rehabilitation

## 2013-04-04 ENCOUNTER — Ambulatory Visit (HOSPITAL_BASED_OUTPATIENT_CLINIC_OR_DEPARTMENT_OTHER): Payer: BC Managed Care – PPO | Admitting: Physical Medicine & Rehabilitation

## 2013-04-04 ENCOUNTER — Encounter: Payer: BC Managed Care – PPO | Attending: Physical Medicine & Rehabilitation

## 2013-04-04 VITALS — BP 162/82 | HR 99 | Resp 16 | Ht 74.0 in | Wt 215.0 lb

## 2013-04-04 DIAGNOSIS — L84 Corns and callosities: Secondary | ICD-10-CM | POA: Insufficient documentation

## 2013-04-04 DIAGNOSIS — E1149 Type 2 diabetes mellitus with other diabetic neurological complication: Secondary | ICD-10-CM | POA: Insufficient documentation

## 2013-04-04 DIAGNOSIS — M961 Postlaminectomy syndrome, not elsewhere classified: Secondary | ICD-10-CM

## 2013-04-04 DIAGNOSIS — G8929 Other chronic pain: Secondary | ICD-10-CM | POA: Insufficient documentation

## 2013-04-04 DIAGNOSIS — Z79899 Other long term (current) drug therapy: Secondary | ICD-10-CM | POA: Insufficient documentation

## 2013-04-04 DIAGNOSIS — M545 Low back pain, unspecified: Secondary | ICD-10-CM | POA: Insufficient documentation

## 2013-04-04 DIAGNOSIS — E1140 Type 2 diabetes mellitus with diabetic neuropathy, unspecified: Secondary | ICD-10-CM

## 2013-04-04 DIAGNOSIS — E1142 Type 2 diabetes mellitus with diabetic polyneuropathy: Secondary | ICD-10-CM

## 2013-04-04 MED ORDER — TRAMADOL HCL 50 MG PO TABS: 100.0000 mg | ORAL_TABLET | Freq: Four times a day (QID) | ORAL | Status: DC | PRN

## 2013-04-04 NOTE — Progress Notes (Signed)
  Subjective:    Patient ID: Fernando Anthony, male    DOB: 1956/04/23, 57 y.o.   MRN: [id number]  HPI Pain medications helped for about 6 hours. Has prescription for every 8 hours. Medications reviewed. Patient last seen by me 05/18/2012. No new medications that would interfere with tramadol. Pain Inventory Average Pain 5 Pain Right Now 4 My pain is sharp, dull and aching  In the last 24 hours, has pain interfered with the following? General activity 5 Relation with others 2 Enjoyment of life 5 What TIME of day is your pain at its worst? day,evening,night time Sleep (in general) Fair  Pain is worse with: bending, sitting and standing Pain improves with: rest and medication Relief from Meds: 3  Mobility walk without assistance ability to climb steps?  yes do you drive?  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  no  Physicians involved in your care Any changes since last visit?  no   Family History  Problem Relation Age of Onset  . Cancer Mother   . Heart disease Father    History   Social History  . Marital Status: Married    Spouse Name: N/A    Number of Children: N/A  . Years of Education: N/A   Social History Main Topics  . Smoking status: Current Every Day Smoker  . Smokeless tobacco: None  . Alcohol Use: None  . Drug Use: None  . Sexually Active: None   Other Topics Concern  . None   Social History Narrative  . None   Past Surgical History  Procedure Laterality Date  . Spine surgery     Past Medical History  Diagnosis Date  . Diabetes mellitus   . Hyperlipidemia    BP 162/82  Pulse 99  Resp 16  Ht 6\' 2"  (1.88 m)  Wt 215 lb (97.523 kg)  BMI 27.59 kg/m2  SpO2 96%     Review of Systems  Neurological: Positive for weakness and numbness.       Tingling  All other systems reviewed and are negative.        Objective:   Physical Exam  Nursing note and vitals reviewed. Constitutional: He is oriented to person, place, and time. He appears well-developed and well-nourished.  Musculoskeletal:       Lumbar back: He exhibits decreased range of motion. He exhibits no tenderness.  Neurological: He is alert and oriented to person, place, and time. He has normal strength. A sensory deficit is present. Gait normal.  Decreased pinprick sensation left foot up to ankle Right foot to the tibial tubercle Calluses on the great toes no open areas  Psychiatric: He has a normal mood and affect.          Assessment & Plan:  1.  Lumbar post lami syndrome chronic low back pain.  Increase tramadol to 8 tabs/24 hrs 2.  diabetic neuropathy without significant pain. If this condition becomes more painful may benefit from gabapentin or Lyrica

## 2013-05-24 ENCOUNTER — Other Ambulatory Visit: Payer: Self-pay | Admitting: Endocrinology

## 2013-06-03 ENCOUNTER — Other Ambulatory Visit: Payer: Self-pay | Admitting: Endocrinology

## 2013-06-05 NOTE — Telephone Encounter (Signed)
rx was denied by Dr. Dwyane Dee, patient needs to be seen for follow up

## 2013-07-23 ENCOUNTER — Other Ambulatory Visit: Payer: Self-pay

## 2013-07-23 MED ORDER — TRAMADOL HCL 50 MG PO TABS: 100.0000 mg | ORAL_TABLET | Freq: Four times a day (QID) | ORAL | Status: DC | PRN

## 2013-07-23 NOTE — Telephone Encounter (Signed)
Refill for tramadol called in at [facility]. Refills changed to 5 because of schedule change.

## 2013-09-16 ENCOUNTER — Encounter (HOSPITAL_COMMUNITY): Payer: Self-pay | Admitting: Pharmacy Technician

## 2013-09-17 ENCOUNTER — Encounter (HOSPITAL_COMMUNITY): Payer: Self-pay

## 2013-09-17 ENCOUNTER — Other Ambulatory Visit (HOSPITAL_COMMUNITY): Payer: Self-pay | Admitting: Orthopedic Surgery

## 2013-09-17 ENCOUNTER — Encounter (HOSPITAL_COMMUNITY)
Admission: RE | Admit: 2013-09-17 | Discharge: 2013-09-17 | Disposition: A | Discharge status: Home / Self Care | Payer: BC Managed Care – PPO | Source: Ambulatory Visit | Attending: Orthopedic Surgery | Admitting: Orthopedic Surgery

## 2013-09-17 ENCOUNTER — Other Ambulatory Visit (HOSPITAL_COMMUNITY): Payer: Self-pay | Admitting: *Deleted

## 2013-09-17 HISTORY — DX: Headache: R51

## 2013-09-17 LAB — BASIC METABOLIC PANEL
BUN: 14 mg/dL (ref 6–23)
CO2: 26 mEq/L (ref 19–32)
Calcium: 9.3 mg/dL (ref 8.4–10.5)
Creatinine, Ser: 0.82 mg/dL (ref 0.50–1.35)
GFR calc non Af Amer: >90 mL/min (ref >90)
Glucose, Bld: 272 mg/dL — ABNORMAL HIGH (ref 70–99)
Potassium: 4.5 mEq/L (ref 3.5–5.1)

## 2013-09-17 LAB — CBC
HCT: 45.1 % (ref 39.0–52.0)
Hemoglobin: 15.9 g/dL (ref 13.0–17.0)
MCH: 32.5 pg (ref 26.0–34.0)
MCHC: 35.3 g/dL (ref 30.0–36.0)
RDW: 11.9 % (ref 11.5–15.5)

## 2013-09-17 NOTE — Pre-Procedure Instructions (Signed)
Fernando Anthony  09/17/2013   Your procedure is scheduled on:  Wednesday, September 18, 2013 at 8:30 AM.   Report to [facility] Entrance "A" at 6:30 AM.   Call this number if you have problems the morning of surgery: [phone]   Remember:   Do not eat food or drink liquids after midnight tonight, 09/17/13.   Take these medicines the morning of surgery with A SIP OF WATER: None   Do not wear jewelry.  Do not wear lotions, powders, or cologne. You may wear deodorant.  Do not shave 48 hours prior to surgery. Men may shave face and neck.  Do not bring valuables to the hospital.  [facility] is not responsible                  for any belongings or valuables.               Contacts, dentures or bridgework may not be worn into surgery.  Leave suitcase in the car. After surgery it may be brought to your room.  For patients admitted to the hospital, discharge time is determined by your                treatment team.               Patients discharged the day of surgery will not be allowed to drive  home.  Name and phone number of your driver: Family/friend   Special Instructions: Shower using CHG 2 nights before surgery and the night before surgery.  If you shower the day of surgery use CHG.  Use special wash - you have one bottle of CHG for all showers.  You should use approximately 1/3 of the bottle for each shower.   Please read over the following fact sheets that you were given: Pain Booklet, Coughing and Deep Breathing and Surgical Site Infection Prevention

## 2013-09-18 ENCOUNTER — Encounter (HOSPITAL_COMMUNITY): Payer: BC Managed Care – PPO | Admitting: Certified Registered"

## 2013-09-18 ENCOUNTER — Encounter (HOSPITAL_COMMUNITY)
Admission: RE | Disposition: A | Discharge status: Home / Self Care | Payer: Self-pay | Source: Ambulatory Visit | Attending: Orthopedic Surgery

## 2013-09-18 ENCOUNTER — Ambulatory Visit (HOSPITAL_COMMUNITY): Payer: BC Managed Care – PPO

## 2013-09-18 ENCOUNTER — Ambulatory Visit (HOSPITAL_COMMUNITY): Payer: BC Managed Care – PPO | Admitting: Certified Registered"

## 2013-09-18 ENCOUNTER — Ambulatory Visit (HOSPITAL_COMMUNITY)
Admission: RE | Admit: 2013-09-18 | Discharge: 2013-09-18 | Disposition: A | Discharge status: Home / Self Care | Payer: BC Managed Care – PPO | Source: Ambulatory Visit | Attending: Orthopedic Surgery | Admitting: Orthopedic Surgery

## 2013-09-18 ENCOUNTER — Encounter (HOSPITAL_COMMUNITY): Payer: Self-pay | Admitting: *Deleted

## 2013-09-18 DIAGNOSIS — M869 Osteomyelitis, unspecified: Secondary | ICD-10-CM | POA: Insufficient documentation

## 2013-09-18 DIAGNOSIS — E1169 Type 2 diabetes mellitus with other specified complication: Secondary | ICD-10-CM | POA: Insufficient documentation

## 2013-09-18 DIAGNOSIS — L97509 Non-pressure chronic ulcer of other part of unspecified foot with unspecified severity: Secondary | ICD-10-CM | POA: Insufficient documentation

## 2013-09-18 DIAGNOSIS — Z01818 Encounter for other preprocedural examination: Secondary | ICD-10-CM | POA: Insufficient documentation

## 2013-09-18 DIAGNOSIS — Z794 Long term (current) use of insulin: Secondary | ICD-10-CM | POA: Insufficient documentation

## 2013-09-18 DIAGNOSIS — L02619 Cutaneous abscess of unspecified foot: Secondary | ICD-10-CM | POA: Insufficient documentation

## 2013-09-18 DIAGNOSIS — E1149 Type 2 diabetes mellitus with other diabetic neurological complication: Secondary | ICD-10-CM | POA: Insufficient documentation

## 2013-09-18 DIAGNOSIS — E1142 Type 2 diabetes mellitus with diabetic polyneuropathy: Secondary | ICD-10-CM | POA: Insufficient documentation

## 2013-09-18 DIAGNOSIS — Z01812 Encounter for preprocedural laboratory examination: Secondary | ICD-10-CM | POA: Insufficient documentation

## 2013-09-18 DIAGNOSIS — M86171 Other acute osteomyelitis, right ankle and foot: Secondary | ICD-10-CM

## 2013-09-18 DIAGNOSIS — L03039 Cellulitis of unspecified toe: Secondary | ICD-10-CM | POA: Insufficient documentation

## 2013-09-18 DIAGNOSIS — E785 Hyperlipidemia, unspecified: Secondary | ICD-10-CM | POA: Insufficient documentation

## 2013-09-18 DIAGNOSIS — M9080 Osteopathy in diseases classified elsewhere, unspecified site: Secondary | ICD-10-CM | POA: Insufficient documentation

## 2013-09-18 DIAGNOSIS — Z79899 Other long term (current) drug therapy: Secondary | ICD-10-CM | POA: Insufficient documentation

## 2013-09-18 DIAGNOSIS — F17200 Nicotine dependence, unspecified, uncomplicated: Secondary | ICD-10-CM | POA: Insufficient documentation

## 2013-09-18 HISTORY — PX: AMPUTATION: SHX166

## 2013-09-18 LAB — APTT: aPTT: 25 seconds (ref 24–37)

## 2013-09-18 LAB — GLUCOSE, CAPILLARY: Glucose-Capillary: 254 mg/dL — ABNORMAL HIGH (ref 70–99)

## 2013-09-18 SURGERY — AMPUTATION DIGIT
Anesthesia: General | Site: Foot | Laterality: Right | Wound class: Dirty or Infected

## 2013-09-18 MED ORDER — MIDAZOLAM HCL 5 MG/5ML IJ SOLN
INTRAMUSCULAR | Status: DC | PRN
  Administered 2013-09-18: 2 mg via INTRAVENOUS

## 2013-09-18 MED ORDER — HYDROMORPHONE HCL PF 1 MG/ML IJ SOLN
INTRAMUSCULAR | Status: AC
  Filled 2013-09-18: qty 1

## 2013-09-18 MED ORDER — FENTANYL CITRATE 0.05 MG/ML IJ SOLN
INTRAMUSCULAR | Status: DC | PRN
  Administered 2013-09-18: 100 ug via INTRAVENOUS
  Administered 2013-09-18 (×3): 50 ug via INTRAVENOUS

## 2013-09-18 MED ORDER — LACTATED RINGERS IV SOLN
INTRAVENOUS | Status: DC | PRN
  Administered 2013-09-18: 08:00:00 via INTRAVENOUS

## 2013-09-18 MED ORDER — MEPERIDINE HCL 25 MG/ML IJ SOLN: 6.2500 mg | INTRAMUSCULAR | Status: DC | PRN

## 2013-09-18 MED ORDER — FENTANYL CITRATE 0.05 MG/ML IJ SOLN
INTRAMUSCULAR | Status: AC
  Filled 2013-09-18: qty 2

## 2013-09-18 MED ORDER — OXYCODONE HCL 5 MG/5ML PO SOLN: 5.0000 mg | Freq: Once | ORAL | Status: AC | PRN

## 2013-09-18 MED ORDER — 0.9 % SODIUM CHLORIDE (POUR BTL) OPTIME
TOPICAL | Status: DC | PRN
  Administered 2013-09-18: 1000 mL

## 2013-09-18 MED ORDER — HYDROMORPHONE HCL PF 1 MG/ML IJ SOLN
0.2500 mg | INTRAMUSCULAR | Status: DC | PRN
  Administered 2013-09-18 (×4): 0.5 mg via INTRAVENOUS

## 2013-09-18 MED ORDER — HYDROMORPHONE HCL PF 1 MG/ML IJ SOLN
0.5000 mg | INTRAMUSCULAR | Status: AC | PRN
  Administered 2013-09-18: 0.5 mg via INTRAVENOUS

## 2013-09-18 MED ORDER — ONDANSETRON HCL 4 MG/2ML IJ SOLN: 4.0000 mg | Freq: Once | INTRAMUSCULAR | Status: DC | PRN

## 2013-09-18 MED ORDER — PROPOFOL 10 MG/ML IV BOLUS
INTRAVENOUS | Status: DC | PRN
  Administered 2013-09-18: 200 mg via INTRAVENOUS

## 2013-09-18 MED ORDER — ONDANSETRON HCL 4 MG/2ML IJ SOLN
INTRAMUSCULAR | Status: DC | PRN
  Administered 2013-09-18: 4 mg via INTRAVENOUS

## 2013-09-18 MED ORDER — LIDOCAINE HCL (CARDIAC) 20 MG/ML IV SOLN
INTRAVENOUS | Status: DC | PRN
  Administered 2013-09-18: 80 mg via INTRAVENOUS

## 2013-09-18 MED ORDER — FENTANYL CITRATE 0.05 MG/ML IJ SOLN
100.0000 ug | Freq: Once | INTRAMUSCULAR | Status: AC
  Administered 2013-09-18: 100 ug via INTRAVENOUS

## 2013-09-18 MED ORDER — CEFAZOLIN SODIUM-DEXTROSE 2-3 GM-% IV SOLR
2.0000 g | INTRAVENOUS | Status: AC
  Administered 2013-09-18: 2 g via INTRAVENOUS

## 2013-09-18 MED ORDER — OXYCODONE HCL 5 MG PO TABS
ORAL_TABLET | ORAL | Status: AC
  Filled 2013-09-18: qty 1

## 2013-09-18 MED ORDER — OXYCODONE-ACETAMINOPHEN 5-325 MG PO TABS: 1.0000 | ORAL_TABLET | ORAL | Status: DC | PRN

## 2013-09-18 MED ORDER — OXYCODONE HCL 5 MG PO TABS
5.0000 mg | ORAL_TABLET | Freq: Once | ORAL | Status: AC | PRN
  Administered 2013-09-18: 5 mg via ORAL

## 2013-09-18 MED ORDER — HYDROMORPHONE HCL PF 1 MG/ML IJ SOLN
0.5000 mg | INTRAMUSCULAR | Status: DC | PRN
  Administered 2013-09-18: 0.5 mg via INTRAVENOUS

## 2013-09-18 SURGICAL SUPPLY — 31 items
BANDAGE GAUZE ELAST BULKY 4 IN (GAUZE/BANDAGES/DRESSINGS) ×2 IMPLANT
BNDG COHESIVE 6X5 TAN STRL LF (GAUZE/BANDAGES/DRESSINGS) ×2 IMPLANT
BNDG ESMARK 4X9 LF (GAUZE/BANDAGES/DRESSINGS) IMPLANT
CLOTH BEACON ORANGE TIMEOUT ST (SAFETY) ×2 IMPLANT
COVER SURGICAL LIGHT HANDLE (MISCELLANEOUS) ×2 IMPLANT
DRAPE U-SHAPE 47X51 STRL (DRAPES) ×2 IMPLANT
DRSG ADAPTIC 3X8 NADH LF (GAUZE/BANDAGES/DRESSINGS) ×2 IMPLANT
DRSG PAD ABDOMINAL 8X10 ST (GAUZE/BANDAGES/DRESSINGS) ×2 IMPLANT
DURAPREP 26ML APPLICATOR (WOUND CARE) ×2 IMPLANT
ELECT REM PT RETURN 9FT ADLT (ELECTROSURGICAL) ×2
ELECTRODE REM PT RTRN 9FT ADLT (ELECTROSURGICAL) ×1 IMPLANT
GLOVE BIOGEL PI IND STRL 9 (GLOVE) ×1 IMPLANT
GLOVE BIOGEL PI INDICATOR 9 (GLOVE) ×1
GLOVE SURG ORTHO 9.0 STRL STRW (GLOVE) ×2 IMPLANT
GOWN PREVENTION PLUS XLARGE (GOWN DISPOSABLE) ×2 IMPLANT
GOWN SRG XL XLNG 56XLVL 4 (GOWN DISPOSABLE) ×1 IMPLANT
GOWN STRL NON-REIN XL XLG LVL4 (GOWN DISPOSABLE) ×1
KIT BASIN OR (CUSTOM PROCEDURE TRAY) ×2 IMPLANT
KIT ROOM TURNOVER OR (KITS) ×2 IMPLANT
MANIFOLD NEPTUNE II (INSTRUMENTS) ×2 IMPLANT
NEEDLE 22X1 1/2 (OR ONLY) (NEEDLE) IMPLANT
NS IRRIG 1000ML POUR BTL (IV SOLUTION) ×2 IMPLANT
PACK ORTHO EXTREMITY (CUSTOM PROCEDURE TRAY) ×2 IMPLANT
PAD ARMBOARD 7.5X6 YLW CONV (MISCELLANEOUS) ×4 IMPLANT
SPONGE GAUZE 4X4 12PLY (GAUZE/BANDAGES/DRESSINGS) ×2 IMPLANT
SUCTION FRAZIER TIP 10 FR DISP (SUCTIONS) IMPLANT
SUT ETHILON 2 0 PSLX (SUTURE) ×2 IMPLANT
SYR CONTROL 10ML LL (SYRINGE) IMPLANT
TOWEL OR 17X24 6PK STRL BLUE (TOWEL DISPOSABLE) ×2 IMPLANT
TOWEL OR 17X26 10 PK STRL BLUE (TOWEL DISPOSABLE) ×2 IMPLANT
TUBE CONNECTING 12X1/4 (SUCTIONS) IMPLANT

## 2013-09-18 NOTE — Anesthesia Procedure Notes (Signed)
Procedure Name: LMA Insertion Date/Time: 09/18/2013 8:35 AM Performed by: Manuela Schwartz B Pre-anesthesia Checklist: Patient identified, Emergency Drugs available, Suction available, Patient being monitored and Timeout performed Patient Re-evaluated:Patient Re-evaluated prior to inductionOxygen Delivery Method: Circle system utilized Preoxygenation: Pre-oxygenation with 100% oxygen Intubation Type: IV induction LMA: LMA inserted LMA Size: 4.0 Number of attempts: 1 Placement Confirmation: positive ETCO2 and breath sounds checked- equal and bilateral Tube secured with: Tape Dental Injury: Teeth and Oropharynx as per pre-operative assessment

## 2013-09-18 NOTE — Discharge Summary (Signed)
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  Surgical procedure amputation of the right great toe at the MTP joint.  Discharge to home in stable condition.  Followup in the office in one week.

## 2013-09-18 NOTE — Anesthesia Postprocedure Evaluation (Signed)
Anesthesia Post Note  Patient: Fernando Anthony  Procedure(s) Performed: Procedure(s) (LRB): Amputation Right Great Toe at MTP (Right)  Anesthesia type: general  Patient location: PACU  Post pain: Pain level controlled  Post assessment: Patient's Cardiovascular Status Stable  Last Vitals:  Filed Vitals:   09/18/13 1030  BP: 155/85  Pulse: 64  Temp: 36.5 C  Resp: 16    Post vital signs: Reviewed and stable  Level of consciousness: sedated  Complications: No apparent anesthesia complications

## 2013-09-18 NOTE — Op Note (Signed)
OPERATIVE REPORT  DATE OF SURGERY: 09/18/2013  PATIENT:  Fernando Anthony,  57 y.o. 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 Patient failed prolonged conservative therapy and presents at this time for surgical intervention. Risk and benefits were discussed including persistent infection nonhealing of the wound need for additional surgery. Patient states he understands and wishes to proceed at this time. Description of procedure patient was brought to the operating room and underwent a general anesthetic. After adequate levels of anesthesia were obtained patient's right lower extremity was prepped using DuraPrep x2 and draped into a sterile field. . The skin was incised in a fishmouth incision and the toe was amputated at the MTP joint. There is no purulence hemostasis was obtained the wound is irrigated with normal saline. The incision was closed using 2-0 nylon. The wound was covered with Adaptic orthopedic sponges ABDs dressing Kerlix and Coban. Patient was extubated taken to the PACU in stable condition.  PLAN OF CARE: Discharge to home after PACU  PATIENT DISPOSITION:  PACU - hemodynamically stable.   Newt Minion, MD 09/18/2013 9:07 AM

## 2013-09-18 NOTE — Transfer of Care (Signed)
Immediate Anesthesia Transfer of Care Note  Patient: Fernando Anthony  Procedure(s) Performed: Procedure(s) with comments: Amputation Right Great Toe at MTP (Right) - Amputation Right Great Toe at MTP  Patient Location: PACU  Anesthesia Type:General  Level of Consciousness: awake, alert  and oriented  Airway & Oxygen Therapy: Patient Spontanous Breathing  Post-op Assessment: Report given to PACU RN and Post -op Vital signs reviewed and stable  Post vital signs: Reviewed and stable  Complications: No apparent anesthesia complications

## 2013-09-18 NOTE — Anesthesia Preprocedure Evaluation (Addendum)
Anesthesia Evaluation  Patient identified by MRN, date of birth, ID band Patient awake    Reviewed: Allergy & Precautions, H&P , NPO status , Patient's Chart, lab work & pertinent test results  History of Anesthesia Complications Negative for: history of anesthetic complications  Airway Mallampati: I TM Distance: >3 FB Neck ROM: Full    Dental  (+) Teeth Intact   Pulmonary          Cardiovascular     Neuro/Psych    GI/Hepatic   Endo/Other  diabetes, Type 2, Insulin Dependent  Renal/GU      Musculoskeletal   Abdominal   Peds  Hematology   Anesthesia Other Findings   Reproductive/Obstetrics                           Anesthesia Physical Anesthesia Plan  ASA: III  Anesthesia Plan: General   Post-op Pain Management:    Induction: Intravenous  Airway Management Planned: LMA  Additional Equipment:   Intra-op Plan:   Post-operative Plan: Extubation in OR  Informed Consent: I have reviewed the patients History and Physical, chart, labs and discussed the procedure including the risks, benefits and alternatives for the proposed anesthesia with the patient or authorized representative who has indicated his/her understanding and acceptance.   Dental advisory given  Plan Discussed with: Surgeon and CRNA  Anesthesia Plan Comments:        Anesthesia Quick Evaluation

## 2013-09-18 NOTE — Progress Notes (Signed)
Orthopedic Tech Progress Note Patient Details:  Fernando Anthony 18-Nov-1956 [id number]  Ortho Devices Type of Ortho Device: Postop shoe/boot Ortho Device/Splint Interventions: Application   Irish Elders 09/18/2013, 10:49 AM

## 2013-09-18 NOTE — H&P (Signed)
Fernando Anthony is an 57 y.o. male.   Chief Complaint: Osteomyelitis abscess right great toe HPI: Patient is a 57 year old gentleman with diabetic insensate neuropathy who presents with osteomyelitis abscess ulceration right great toe failed conservative care.  Past Medical History  Diagnosis Date  . Diabetes mellitus   . Hyperlipidemia   . [id number]     general    Past Surgical History  Procedure Laterality Date  . Spine surgery    . Cardiac catheterization  ?1990    Family History  Problem Relation Age of Onset  . Cancer Mother   . Heart disease Father    Social History:  reports that he has been smoking.  He has never used smokeless tobacco. He reports that he drinks alcohol. He reports that he does not use illicit drugs.  Allergies:  Allergies  Allergen Reactions  . Vicodin [Hydrocodone-Acetaminophen] Itching    Medications Prior to Admission  Medication Sig Dispense Refill  . insulin glargine (LANTUS) 100 UNIT/ML injection Inject 35 Units into the skin 2 (two) times daily.       . metFORMIN (GLUCOPHAGE) 500 MG tablet Take 500 mg by mouth 2 (two) times daily with a meal.      . Multiple Vitamin (MULTIVITAMIN WITH MINERALS) TABS tablet Take 1 tablet by mouth daily.      . rosuvastatin (CRESTOR) 5 MG tablet Take 5 mg by mouth daily.        Results for orders placed during the hospital encounter of 09/17/13 (from the past 48 hour(s))  CBC     Status: Abnormal   Collection Time    09/17/13  9:37 AM      Result Value Range   WBC 11.7 (*) 4.0 - 10.5 K/uL   RBC 4.89  4.22 - 5.81 MIL/uL   Hemoglobin 15.9  13.0 - 17.0 g/dL   HCT 45.1  39.0 - 52.0 %   MCV 92.2  78.0 - 100.0 fL   MCH 32.5  26.0 - 34.0 pg   MCHC 35.3  30.0 - 36.0 g/dL   RDW 11.9  11.5 - 15.5 %   Platelets 195  150 - 400 K/uL  BASIC METABOLIC PANEL     Status: Abnormal   Collection Time    09/17/13  9:42 AM      Result Value Range   Sodium 133 (*) 135 - 145 mEq/L   Potassium 4.5  3.5 - 5.1 mEq/L    Chloride 97  96 - 112 mEq/L   CO2 26  19 - 32 mEq/L   Glucose, Bld 272 (*) 70 - 99 mg/dL   BUN 14  6 - 23 mg/dL   Creatinine, Ser 0.82  0.50 - 1.35 mg/dL   Calcium 9.3  8.4 - 10.5 mg/dL   GFR calc non Af Amer >90  >90 mL/min   GFR calc Af Amer >90  >90 mL/min   Comment: (NOTE)     The eGFR has been calculated using the CKD EPI equation.     This calculation has not been validated in all clinical situations.     eGFR's persistently <90 mL/min signify possible Chronic Kidney     Disease.   No results found.  Review of Systems  All other systems reviewed and are negative.    There were no vitals taken for this visit. Physical Exam  On examination patient has a palpable dorsalis pedis pulse there is ulceration abscess and osteomyelitis of the right great toe Assessment/Plan Assessment: Osteomyelitis  abscess ulceration right great toe.  Plan: Will plan for amputation of the great toe at the MTP joint. Risks and benefits were discussed including infection nonhealing of the wound need for additional surgery. Patient states he understands and wished to proceed at this time.  Kihanna Kamiya V 09/18/2013, 6:35 AM

## 2013-09-19 ENCOUNTER — Encounter (HOSPITAL_COMMUNITY): Payer: Self-pay | Admitting: Orthopedic Surgery

## 2013-09-20 NOTE — Addendum Note (Signed)
Addendum created 09/20/13 1047 by Lillia Abed, MD   Modules edited: Anesthesia Attestations

## 2014-01-14 ENCOUNTER — Encounter: Payer: BC Managed Care – PPO | Attending: Physical Medicine & Rehabilitation

## 2014-01-14 ENCOUNTER — Ambulatory Visit (HOSPITAL_BASED_OUTPATIENT_CLINIC_OR_DEPARTMENT_OTHER): Payer: BC Managed Care – PPO | Admitting: Physical Medicine & Rehabilitation

## 2014-01-14 ENCOUNTER — Encounter: Payer: Self-pay | Admitting: Physical Medicine & Rehabilitation

## 2014-01-14 VITALS — BP 178/85 | HR 71 | Resp 14 | Ht 74.0 in | Wt 213.8 lb

## 2014-01-14 DIAGNOSIS — E1142 Type 2 diabetes mellitus with diabetic polyneuropathy: Secondary | ICD-10-CM | POA: Insufficient documentation

## 2014-01-14 DIAGNOSIS — M545 Low back pain, unspecified: Secondary | ICD-10-CM | POA: Insufficient documentation

## 2014-01-14 DIAGNOSIS — G8929 Other chronic pain: Secondary | ICD-10-CM | POA: Insufficient documentation

## 2014-01-14 DIAGNOSIS — M961 Postlaminectomy syndrome, not elsewhere classified: Secondary | ICD-10-CM

## 2014-01-14 DIAGNOSIS — E1149 Type 2 diabetes mellitus with other diabetic neurological complication: Secondary | ICD-10-CM | POA: Insufficient documentation

## 2014-01-14 DIAGNOSIS — E1140 Type 2 diabetes mellitus with diabetic neuropathy, unspecified: Secondary | ICD-10-CM

## 2014-01-14 MED ORDER — TRAMADOL HCL 50 MG PO TABS: 100.0000 mg | ORAL_TABLET | Freq: Four times a day (QID) | ORAL | Status: DC | PRN

## 2014-01-14 NOTE — Patient Instructions (Signed)
Please call if neuropathy pain worsens, we may need to start gabapentin

## 2014-01-14 NOTE — Progress Notes (Signed)
Subjective:    Patient ID: Fernando Anthony, male    DOB: Apr 14, 1956, 58 y.o.   MRN: [id number]  HPI Decent results with Tramadol, 2 po QID Diabetic neuropathy, callus became infection s/p R great toe amp, Oct 2014. Pain Inventory Average Pain 3 Pain Right Now 2 My pain is sharp, dull and aching  In the last 24 hours, has pain interfered with the following? General activity 3 Relation with others 0 Enjoyment of life 2 What TIME of day is your pain at its worst? 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  no  Physicians involved in your care Any changes since last visit?  no   Family History  Problem Relation Age of Onset  . Cancer Mother   . Heart disease Father    History   Social History  . Marital Status: Married    Spouse Name: N/A    Number of Children: N/A  . Years of Education: N/A   Social History Main Topics  . Smoking status: Current Every Day Smoker  . Smokeless tobacco: Never Used  . Alcohol Use: Yes     Comment: occasional beer  . Drug Use: No     Comment: past use of marijuana  . Sexual Activity: None   Other Topics Concern  . None   Social History Narrative  . None   Past Surgical History  Procedure Laterality Date  . Spine surgery    . Cardiac catheterization  ?1990  . Amputation Right 09/18/2013    Procedure: Amputation Right Great Toe at MTP;  Surgeon: Newt Minion, MD;  Location: [place];  Service: Orthopedics;  Laterality: Right;  Amputation Right Great Toe at MTP   Past Medical History  Diagnosis Date  . Diabetes mellitus   . Hyperlipidemia   . Headache(784.0)     general   BP 178/85  Pulse 71  Resp 14  Ht 6\' 2"  (1.88  m)  Wt 213 lb 12.8 oz (96.979 kg)  BMI 27.44 kg/m2  SpO2 98%  Opioid Risk Score:   Fall Risk Score: Low Fall Risk (0-5 points)   Review of Systems  Musculoskeletal: Positive for back pain.  Neurological: Positive for weakness and numbness.       Tingling  All other systems reviewed and are negative.       Objective:   Physical Exam  Nursing note and vitals reviewed. Constitutional: He is oriented to person, place, and time. He appears well-developed and well-nourished.  HENT:  Head: Normocephalic and atraumatic.  Eyes: Conjunctivae and EOM are normal. Pupils are equal, round, and reactive to light.  Musculoskeletal:  Decreased Lumbar ROM, flexion , ext and rotation  Neurological: He is alert and oriented to person, place, and time. He has normal strength. A sensory deficit is present.  Normal sensation at knees Reduced pp below knee on Right and below ankle on Left    Psychiatric: He has a normal mood and affect.          Assessment & Plan:  1.  Chronic low back pain- cont tramadol, working full time physical labor, no signs of misuse, continue current medication dosage. Return to clinic 6 months  2.  Diabetic neuropathy  no significant pain- will not use lyrica or gabapentin unless this worsens Discussed diabetic control and that for diabetic control may result in progression of neuropathy

## 2014-02-13 ENCOUNTER — Telehealth: Payer: Self-pay

## 2014-02-13 MED ORDER — TRAMADOL HCL ER 300 MG PO TB24: 300.0000 mg | ORAL_TABLET | Freq: Every day | ORAL | Status: DC

## 2014-02-13 NOTE — Telephone Encounter (Signed)
May call in tramadol ER 300mg  po qdaily #30 1 RF

## 2014-02-13 NOTE — Telephone Encounter (Signed)
Patient is requesting to switch from Tramadol to Tramadol ER. He says that he has previously taken time released Tramadol and it worked way better for his pain and lasted between doses unlike regular Tramadol Please advise.

## 2014-02-13 NOTE — Telephone Encounter (Signed)
Tramadol ER 300 mg phoned in at pharmacy. Patient is aware.

## 2014-04-04 ENCOUNTER — Ambulatory Visit: Payer: BC Managed Care – PPO | Admitting: Physical Medicine & Rehabilitation

## 2014-07-03 ENCOUNTER — Ambulatory Visit: Payer: BC Managed Care – PPO | Admitting: Registered Nurse

## 2014-07-07 ENCOUNTER — Ambulatory Visit: Payer: BC Managed Care – PPO | Admitting: Physical Medicine & Rehabilitation

## 2014-07-25 ENCOUNTER — Encounter: Payer: BC Managed Care – PPO | Attending: Registered Nurse | Admitting: Registered Nurse

## 2014-07-25 ENCOUNTER — Encounter: Payer: Self-pay | Admitting: Registered Nurse

## 2014-07-25 VITALS — BP 185/97 | HR 86 | Resp 14 | Wt 209.8 lb

## 2014-07-25 DIAGNOSIS — M961 Postlaminectomy syndrome, not elsewhere classified: Secondary | ICD-10-CM | POA: Diagnosis present

## 2014-07-25 DIAGNOSIS — E1142 Type 2 diabetes mellitus with diabetic polyneuropathy: Secondary | ICD-10-CM

## 2014-07-25 DIAGNOSIS — E1149 Type 2 diabetes mellitus with other diabetic neurological complication: Secondary | ICD-10-CM | POA: Diagnosis not present

## 2014-07-25 DIAGNOSIS — E1340 Other specified diabetes mellitus with diabetic neuropathy, unspecified: Secondary | ICD-10-CM

## 2014-07-25 MED ORDER — TRAMADOL HCL 50 MG PO TABS: ORAL_TABLET | ORAL | Status: DC

## 2014-07-25 NOTE — Progress Notes (Signed)
Subjective:    Patient ID: Fernando Anthony, male    DOB: January 03, 1956, 58 y.o.   MRN: [id number]  HPI: Mr. Fernando Anthony is a 58 year old male who returns for follow up for chronic pain and medication refill. He says his pain is in his lower back mainly right side. He rates his pain 8. His current exercise regime is walking at work. He works as a Nature conservation officer.  [facility] was called for clarification of Tramadol.  Patient taking Tramadol 50 mg two tablets every 6 hours as needed. Script given.  Pain Inventory Average Pain 8 Pain Right Now 8 My pain is sharp, dull, stabbing and aching  In the last 24 hours, has pain interfered with the following? General activity 7 Relation with others 4 Enjoyment of life 7 What TIME of day is your pain at its worst? daytime evening night  Sleep (in general) Poor  Pain is worse with: walking, bending, sitting and standing Pain improves with: medication Relief from Meds: 8  Mobility walk without assistance ability to climb steps?  yes do you drive?  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  no  Physicians involved in your care Any changes since last visit?  no   Family History  Problem Relation Age of Onset  . Cancer Mother   . Heart disease Father    History   Social History  . Marital Status: Married    Spouse Name: N/A    Number of Children: N/A  . Years of Education: N/A   Social History Main Topics  . Smoking status: Current Every Day Smoker  . Smokeless tobacco: Never Used  . Alcohol Use: Yes     Comment: occasional beer  . Drug Use: No     Comment: past use of marijuana  . Sexual Activity: None   Other Topics Concern  . None   Social History Narrative  . None   Past Surgical History  Procedure Laterality Date  . Spine surgery    . Cardiac catheterization  ?1990  . Amputation  Right 09/18/2013    Procedure: Amputation Right Great Toe at MTP;  Surgeon: Newt Minion, MD;  Location: [place];  Service: Orthopedics;  Laterality: Right;  Amputation Right Great Toe at MTP   Past Medical History  Diagnosis Date  . Diabetes mellitus   . Hyperlipidemia   . Headache(784.0)     general   BP 185/97  Pulse 86  Resp 14  Wt 209 lb 12.8 oz (95.165 kg)  SpO2 96%  Opioid Risk Score:   Fall Risk Score: Low Fall Risk (0-5 points) (previously educated ) Review of Systems  Endocrine:       High blood sugars  Neurological: Positive for weakness and numbness.  All other systems reviewed and are negative.      Objective:   Physical Exam  Nursing note and vitals reviewed. Constitutional: He is oriented to person, place, and time. He appears well-developed and well-nourished.  HENT:  Head: Normocephalic and atraumatic.  Neck: Normal range of motion. Neck supple.  Cardiovascular: Normal rate and regular rhythm.   Pulmonary/Chest: Effort normal and breath sounds normal.  Musculoskeletal:  Normal Muscle Bulk and Muscle testing Reveals: Upper Extremities: Full ROM and Muscle Strength 5/5 Spinal Forward Flexion: 30 Degrees and Extension 20 Degrees Thoracic and Lumbar Hypersensitivity Lower Extremities: Full  ROM and Muscle Strength 5/5 Right Leg Flexion Produces pain into Lumbar Arises from chair with ease Narrow Based gait  Neurological: He is alert and oriented to person, place, and time.  Skin: Skin is warm and dry.  Psychiatric: He has a normal mood and affect.          Assessment & Plan:  1. 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 RX: Tramadol 50 mg take 2 tablets every 6 hours as needed #240 2. Diabetic neuropathy: No Complaints Voiced  20 minutes of face to face patient care time was spent during this visit. All questions were encouraged and answered.  F/U in 6 months

## 2014-09-12 ENCOUNTER — Telehealth: Payer: Self-pay | Admitting: *Deleted

## 2014-09-12 NOTE — Telephone Encounter (Signed)
Pt called informing that he is taking 8 tablets of tramadol a day. He states that it is currently not working for him. He would like his dose increased to 10 tablets a day. Please advise

## 2014-09-13 NOTE — Telephone Encounter (Signed)
8/d is max dose

## 2014-09-15 NOTE — Telephone Encounter (Signed)
Called pt and told him that Dr. Raliegh Ip stated that 8 pills per day is the max.  I told him he would have to move up his appt and to at [facility] about increasing meds

## 2014-09-16 ENCOUNTER — Encounter: Payer: Self-pay | Admitting: Physical Medicine & Rehabilitation

## 2014-09-16 ENCOUNTER — Encounter: Payer: BC Managed Care – PPO | Attending: Registered Nurse

## 2014-09-16 ENCOUNTER — Ambulatory Visit (HOSPITAL_BASED_OUTPATIENT_CLINIC_OR_DEPARTMENT_OTHER): Payer: BC Managed Care – PPO | Admitting: Physical Medicine & Rehabilitation

## 2014-09-16 VITALS — BP 170/98 | HR 95 | Resp 14 | Ht 74.0 in | Wt 205.0 lb

## 2014-09-16 DIAGNOSIS — M961 Postlaminectomy syndrome, not elsewhere classified: Secondary | ICD-10-CM | POA: Diagnosis present

## 2014-09-16 DIAGNOSIS — E1142 Type 2 diabetes mellitus with diabetic polyneuropathy: Secondary | ICD-10-CM

## 2014-09-16 MED ORDER — GABAPENTIN 100 MG PO CAPS: 100.0000 mg | ORAL_CAPSULE | Freq: Three times a day (TID) | ORAL | Status: DC

## 2014-09-16 MED ORDER — CYCLOBENZAPRINE HCL 10 MG PO TABS: 10.0000 mg | ORAL_TABLET | Freq: Every day | ORAL | Status: DC

## 2014-09-16 NOTE — Patient Instructions (Signed)
Take muscle relaxant, cyclobenzaprine at night May take gabapentin 100 mg 3 times per day during the day, try it first on a weekend when you do not have to work

## 2014-09-16 NOTE — Progress Notes (Signed)
Subjective:    Patient ID: Fernando Anthony, male    DOB: [birth year], 58 y.o.   MRN: [id number]  HPI 58 year old male with history of chronic low back pain who is Status post decompression and discectomy at L5-S1 performed in 2009 or 2010.  Patient has been well managed over time on tramadol initially 2 tablets 3 times a day and for last 2 years 2 tablets 4 times per day. 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   Patient also has a history of diabetic neuropathy and has undergone right great toe amputation  Also has a chronic nonhealing diabetic ulcer on the medial aspect of the left great toe  Patient feels like his medications are not working as well as they used to. He is wondering whether another disc in his back is going bad. He denies any change in his symptoms. He has evening symptoms that seem to be most pronounced. Because of his job he is reluctant to take any type of medication which may cause him to be drowsy.  Pain Inventory Average Pain 8 Pain Right Now 6 My pain is sharp and aching  In the last 24 hours, has pain interfered with the following? General activity 7 Relation with others 3 Enjoyment of life 7 What TIME of day is your pain at its worst? daytime evening night Sleep (in general) Poor  Pain is worse with: bending and sitting Pain improves with: medication Relief from Meds: 5  Mobility walk without assistance ability to climb steps?  yes do you drive?  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  no  Physicians involved in your care Any changes since last visit?  no   Family History  Problem Relation Age of Onset  . Cancer Mother   . Heart disease Father    History   Social History  . Marital Status: Married    Spouse Name: N/A    Number of Children: N/A  . Years of Education: N/A   Social  History Main Topics  . Smoking status: Current Every Day Smoker  . Smokeless tobacco: Never Used  . Alcohol Use: Yes     Comment: occasional beer  . Drug Use: No     Comment: past use of marijuana  . Sexual Activity: None   Other Topics Concern  . None   Social History Narrative  . None   Past Surgical History  Procedure Laterality Date  . Spine surgery    . Cardiac catheterization  ?1990  . Amputation Right 09/18/2013    Procedure: Amputation Right Great Toe at MTP;  Surgeon: Newt Minion, MD;  Location: [place];  Service: Orthopedics;  Laterality: Right;  Amputation Right Great Toe at MTP   Past Medical History  Diagnosis Date  . Diabetes mellitus   . Hyperlipidemia   . Headache(784.0)     general   BP 170/98  Pulse 95  Resp 14  Ht 6\' 2"  (1.88 m)  Wt 205 lb (92.987 kg)  BMI 26.31 kg/m2  SpO2 97%  Opioid Risk Score:   Fall Risk Score: Low Fall Risk (0-5 points) (previously educated)  Review of Systems  Endocrine:       High blood sugars   All other systems reviewed and are negative.      Objective:   Physical Exam  Nursing note and  vitals reviewed. Constitutional: He is oriented to person, place, and time. He appears well-developed and well-nourished.  HENT:  Head: Normocephalic and atraumatic.  Eyes: Conjunctivae and EOM are normal. Pupils are equal, round, and reactive to light.  Neck: Normal range of motion.  Musculoskeletal:       Lumbar back: He exhibits decreased range of motion and spasm. He exhibits no tenderness and no deformity.  Diabetic ulcer medial aspect left great toe nondraining no foul odor   Neurological: He is alert and oriented to person, place, and time.  Decreased sensation below the knee on the right and below the ankle on the left. Lumbar range of motion reduced flexion and extension lateral bending and rotation.  Negative straight leg raise test  Psychiatric: He has a normal mood and affect.          Assessment & Plan:    1. Lumbar postlaminectomy syndrome history of L5-S1 fusion approximately 5 years ago. He has no new radicular symptoms. His exam is complicated by diabetic neuropathy. He does not feel like his symptoms have changed but just are not as well relieved by his usual medications. We discussed that he may be developing tolerance to the tramadol., And it is possible also to have some lumbar spondylosis developing in addition to the lumbar degenerative disc. Will check lumbar x-rays Also has muscle spasm as part of his overall pain etiology, add cyclobenzaprine 10 mg daily at bedtime Given the chronicity pain as well as history of diabetes with neuropathy we'll add gabapentin 100 mg 3 times a day  Reluctant to start any narcotic analgesics stronger than tramadol because of history of itching with hydrocodone which was not tolerable  Recheck in 1 month

## 2014-10-08 ENCOUNTER — Ambulatory Visit
Admission: RE | Admit: 2014-10-08 | Discharge: 2014-10-08 | Disposition: A | Discharge status: Home / Self Care | Payer: BC Managed Care – PPO | Source: Ambulatory Visit | Attending: Physical Medicine & Rehabilitation | Admitting: Physical Medicine & Rehabilitation

## 2014-10-08 DIAGNOSIS — M961 Postlaminectomy syndrome, not elsewhere classified: Secondary | ICD-10-CM

## 2014-10-09 ENCOUNTER — Encounter: Payer: Self-pay | Admitting: Physical Medicine & Rehabilitation

## 2014-10-09 ENCOUNTER — Encounter: Payer: BC Managed Care – PPO | Attending: Registered Nurse

## 2014-10-09 ENCOUNTER — Ambulatory Visit (HOSPITAL_BASED_OUTPATIENT_CLINIC_OR_DEPARTMENT_OTHER): Payer: BC Managed Care – PPO | Admitting: Physical Medicine & Rehabilitation

## 2014-10-09 VITALS — BP 151/93 | HR 75 | Resp 14 | Ht 74.0 in | Wt 203.0 lb

## 2014-10-09 DIAGNOSIS — Z5181 Encounter for therapeutic drug level monitoring: Secondary | ICD-10-CM

## 2014-10-09 DIAGNOSIS — G894 Chronic pain syndrome: Secondary | ICD-10-CM | POA: Insufficient documentation

## 2014-10-09 DIAGNOSIS — M961 Postlaminectomy syndrome, not elsewhere classified: Secondary | ICD-10-CM

## 2014-10-09 DIAGNOSIS — Z79899 Other long term (current) drug therapy: Secondary | ICD-10-CM | POA: Insufficient documentation

## 2014-10-09 NOTE — Progress Notes (Signed)
Subjective:    Patient ID: Fernando Anthony, male    DOB: [birth year], 58 y.o.   MRN: [id number]  HPI 58 year old male with history of chronic low back pain who is Status post decompression and discectomy at L5-S1 performed in 2009 or 2010. Patient has been well managed over time on tramadol initially 2 tablets 3 times a day and for last 2 years 2 tablets 4 times per day. 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  Patient also has a history of diabetic neuropathy and has undergone right great toe amputation  Also has a chronic nonhealing diabetic ulcer on the medial aspect  Patient states that the cyclobenzaprine is helpful for sleep at night. He tried it during the day but it made him too drowsy. He feels that the tramadol is not sufficient for his pain even with ibuprofen  Reviewed x-rays. Mild disc space narrowing L5-S1 mild spondylosis Pain Inventory Average Pain 6 Pain Right Now 5 My pain is sharp, dull, stabbing and aching  In the last 24 hours, has pain interfered with the following? General activity 6 Relation with others 4 Enjoyment of life 6 What TIME of day is your pain at its worst? 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  yes x-rays  Physicians involved in your care Any changes since last visit?  no   Family History  Problem Relation Age of Onset  . Cancer Mother   . Heart disease Father    History   Social History  . Marital Status: Married    Spouse Name: N/A    Number of Children: N/A  . Years of Education: N/A   Social History Main Topics  . Smoking status: Current Every Day Smoker  . Smokeless tobacco:  Never Used  . Alcohol Use: Yes     Comment: occasional beer  . Drug Use: No     Comment: past use of marijuana  . Sexual Activity: None   Other Topics Concern  . None   Social History Narrative   Past Surgical History  Procedure Laterality Date  . Spine surgery    . Cardiac catheterization  ?1990  . Amputation Right 09/18/2013    Procedure: Amputation Right Great Toe at [facility];  Surgeon: Newt Minion, MD;  Location: [place];  Service: Orthopedics;  Laterality: Right;  Amputation Right Great Toe at [facility]   Past Medical History  Diagnosis Date  . Diabetes mellitus   . Hyperlipidemia   . Headache(784.0)     general   BP 151/93 mmHg  Pulse 75  Resp 14  Ht 6\' 2"  (1.88 m)  Wt 203 lb (92.08 kg)  BMI 26.05 kg/m2  SpO2 96%  Opioid Risk Score:   Fall Risk Score: Low Fall Risk (0-5 points)  Review of Systems     Objective:   Physical Exam  Constitutional: He is oriented to person, place, and time. He appears well-developed and well-nourished.  HENT:  Head: Normocephalic and atraumatic.  Eyes: Conjunctivae are normal. Pupils are equal, round, and reactive to light.  Neurological: He is alert and oriented to person, place, and time.  Psychiatric: He has a normal mood and affect.  Nursing note and vitals reviewed.   Decreased lumbar flexion and extension and lateral rotation and bending  Negative straight leg raising Lower extremity strength is normal  No pain to palpation lumbar paraspinals.  Mood and affect are appropriate    Assessment & Plan:  1. Lumbar degenerative disc and spondylosis, pain is only partially responsive to tramadol.We discussed that he is very tight in his low back and even though he has good strength in the reduced flexibility may be affecting his pain levels. 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 Continue  cyclobenzaprine 10 g daily at bedtime Also gave him stretching exercises do for his lumbar spine.  RTC 3 months

## 2014-10-09 NOTE — Progress Notes (Signed)
   Subjective:    Patient ID: Fernando Anthony, male    DOB: 02/02/1956, 58 y.o.   MRN: [id number]  HPI  Pain Inventory Average Pain 4 Pain Right Now 6 My pain is constant, stabbing and aching  In the last 24 hours, has pain interfered with the following? General activity 6 Relation with others 5 Enjoyment of life 7 What TIME of day is your pain at its worst? evening Sleep (in general) Poor  Pain is worse with: walking, bending, sitting, inactivity, standing and some activites Pain improves with: rest, therapy/exercise and medication Relief from Meds: 5  Mobility walk without assistance ability to climb steps?  no do you drive?  yes  Function disabled: date disabled .  Neuro/Psych anxiety  Prior Studies Any changes since last visit?  no  Physicians involved in your care Any changes since last visit?  no   Family History  Problem Relation Age of Onset  . Cancer Mother   . Heart disease Father    History   Social History  . Marital Status: Married    Spouse Name: N/A    Number of Children: N/A  . Years of Education: N/A   Social History Main Topics  . Smoking status: Current Every Day Smoker  . Smokeless tobacco: Never Used  . Alcohol Use: Yes     Comment: occasional beer  . Drug Use: No     Comment: past use of marijuana  . Sexual Activity: None   Other Topics Concern  . None   Social History Narrative   Past Surgical History  Procedure Laterality Date  . Spine surgery    . Cardiac catheterization  ?1990  . Amputation Right 09/18/2013    Procedure: Amputation Right Great Toe at [facility];  Surgeon: Newt Minion, MD;  Location: [place];  Service: Orthopedics;  Laterality: Right;  Amputation Right Great Toe at [facility]   Past Medical History  Diagnosis Date  . Diabetes mellitus   . Hyperlipidemia   . Headache(784.0)     general   BP 126/60 mmHg  Pulse 68  Resp 14  Ht 5\' 7"  (1.702 m)  Wt 193 lb (87.544 kg)  BMI 30.22 kg/m2  SpO2 97%  Opioid Risk  Score:   Fall Risk Score: Low Fall Risk (0-5 points)  Review of Systems  Constitutional: Negative.   HENT: Negative.   Eyes: Negative.   Respiratory: Negative.   Cardiovascular: Negative.   Gastrointestinal: Negative.   Endocrine: Negative.   Genitourinary: Negative.   Musculoskeletal: Positive for myalgias and back pain.  Allergic/Immunologic: Negative.   Neurological: Negative.   Hematological: Negative.   Psychiatric/Behavioral: The patient is nervous/anxious.        Objective:   Physical Exam        Assessment & Plan:

## 2014-10-09 NOTE — Patient Instructions (Signed)
Back Exercises These exercises may help you when beginning to rehabilitate your injury. Your symptoms may resolve with or without further involvement from your physician, physical therapist or athletic trainer. While completing these exercises, remember:   Restoring tissue flexibility helps normal motion to return to the joints. This allows healthier, less painful movement and activity.  An effective stretch should be held for at least 30 seconds.  A stretch should never be painful. You should only feel a gentle lengthening or release in the stretched tissue. STRETCH - Extension, Prone on Elbows   Lie on your stomach on the floor, a bed will be too soft. Place your palms about shoulder width apart and at the height of your head.  Place your elbows under your shoulders. If this is too painful, stack pillows under your chest.  Allow your body to relax so that your hips drop lower and make contact more completely with the floor.  Hold this position for __________ seconds.  Slowly return to lying flat on the floor. Repeat __________ times. Complete this exercise __________ times per day.  RANGE OF MOTION - Extension, Prone Press Ups   Lie on your stomach on the floor, a bed will be too soft. Place your palms about shoulder width apart and at the height of your head.  Keeping your back as relaxed as possible, slowly straighten your elbows while keeping your hips on the floor. You may adjust the placement of your hands to maximize your comfort. As you gain motion, your hands will come more underneath your shoulders.  Hold this position __________ seconds.  Slowly return to lying flat on the floor. Repeat __________ times. Complete this exercise __________ times per day.  RANGE OF MOTION- Quadruped, Neutral Spine   Assume a hands and knees position on a firm surface. Keep your hands under your shoulders and your knees under your hips. You may place padding under your knees for  comfort.  Drop your head and point your tail bone toward the ground below you. This will round out your low back like an angry cat. Hold this position for __________ seconds.  Slowly lift your head and release your tail bone so that your back sags into a large arch, like an old horse.  Hold this position for __________ seconds.  Repeat this until you feel limber in your low back.  Now, find your "sweet spot." This will be the most comfortable position somewhere between the two previous positions. This is your neutral spine. Once you have found this position, tense your stomach muscles to support your low back.  Hold this position for __________ seconds. Repeat __________ times. Complete this exercise __________ times per day.  STRETCH - Flexion, Single Knee to Chest   Lie on a firm bed or floor with both legs extended in front of you.  Keeping one leg in contact with the floor, bring your opposite knee to your chest. Hold your leg in place by either grabbing behind your thigh or at your knee.  Pull until you feel a gentle stretch in your low back. Hold __________ seconds.  Slowly release your grasp and repeat the exercise with the opposite side. Repeat __________ times. Complete this exercise __________ times per day.  STRETCH - Hamstrings, Standing  Stand or sit and extend your right / left leg, placing your foot on a chair or foot stool  Keeping a slight arch in your low back and your hips straight forward.  Lead with your chest and   lean forward at the waist until you feel a gentle stretch in the back of your right / left knee or thigh. (When done correctly, this exercise requires leaning only a small distance.)  Hold this position for __________ seconds. Repeat __________ times. Complete this stretch __________ times per day. STRENGTHENING - Deep Abdominals, Pelvic Tilt   Lie on a firm bed or floor. Keeping your legs in front of you, bend your knees so they are both pointed  toward the ceiling and your feet are flat on the floor.  Tense your lower abdominal muscles to press your low back into the floor. This motion will rotate your pelvis so that your tail bone is scooping upwards rather than pointing at your feet or into the floor.  With a gentle tension and even breathing, hold this position for __________ seconds. Repeat __________ times. Complete this exercise __________ times per day.  STRENGTHENING - Abdominals, Crunches   Lie on a firm bed or floor. Keeping your legs in front of you, bend your knees so they are both pointed toward the ceiling and your feet are flat on the floor. Cross your arms over your chest.  Slightly tip your chin down without bending your neck.  Tense your abdominals and slowly lift your trunk high enough to just clear your shoulder blades. Lifting higher can put excessive stress on the low back and does not further strengthen your abdominal muscles.  Control your return to the starting position. Repeat __________ times. Complete this exercise __________ times per day.  STRENGTHENING - Quadruped, Opposite UE/LE Lift   Assume a hands and knees position on a firm surface. Keep your hands under your shoulders and your knees under your hips. You may place padding under your knees for comfort.  Find your neutral spine and gently tense your abdominal muscles so that you can maintain this position. Your shoulders and hips should form a rectangle that is parallel with the floor and is not twisted.  Keeping your trunk steady, lift your right hand no higher than your shoulder and then your left leg no higher than your hip. Make sure you are not holding your breath. Hold this position __________ seconds.  Continuing to keep your abdominal muscles tense and your back steady, slowly return to your starting position. Repeat with the opposite arm and leg. Repeat __________ times. Complete this exercise __________ times per day. Document Released:  11/25/2005 Document Revised: 01/30/2012 Document Reviewed: 02/19/2009 Exit[employer] Patient Information 2015 [employer]. This information is not intended to replace advice given to you by your health care provider. Make sure you discuss any questions you have with your health care provider.  

## 2014-10-10 ENCOUNTER — Ambulatory Visit: Payer: BC Managed Care – PPO | Admitting: Physical Medicine & Rehabilitation

## 2014-10-10 ENCOUNTER — Other Ambulatory Visit: Payer: Self-pay | Admitting: Physical Medicine & Rehabilitation

## 2014-10-10 DIAGNOSIS — Z79899 Other long term (current) drug therapy: Secondary | ICD-10-CM | POA: Diagnosis not present

## 2014-10-10 DIAGNOSIS — Z5181 Encounter for therapeutic drug level monitoring: Secondary | ICD-10-CM | POA: Diagnosis not present

## 2014-10-10 DIAGNOSIS — M961 Postlaminectomy syndrome, not elsewhere classified: Secondary | ICD-10-CM | POA: Diagnosis not present

## 2014-10-10 DIAGNOSIS — G894 Chronic pain syndrome: Secondary | ICD-10-CM | POA: Diagnosis present

## 2014-10-11 LAB — PMP ALCOHOL METABOLITE (ETG): Ethyl Glucuronide (EtG): NEGATIVE ng/mL

## 2014-10-13 ENCOUNTER — Telehealth: Payer: Self-pay | Admitting: *Deleted

## 2014-10-13 NOTE — Telephone Encounter (Signed)
[facility] is not back yet May not be until next week

## 2014-10-13 NOTE — Telephone Encounter (Signed)
Pt states that Dr. Letta Pate was going to call in a prescription for tylenol, pharmacy has not rec'd it yet, Dr. Letta Pate notes says check urine drug screen and if consistent, then trial Tylenol 3

## 2014-10-14 NOTE — Telephone Encounter (Signed)
Called pt, he understands now, claims no one explained the process

## 2014-10-17 LAB — BENZODIAZEPINES (GC/LC/MS), URINE
Alprazolam metabolite (GC/LC/MS), ur confirm: NEGATIVE ng/mL (ref <25)
Clonazepam metabolite (GC/LC/MS), ur confirm: NEGATIVE ng/mL (ref <25)
Flurazepam metabolite (GC/LC/MS), ur confirm: NEGATIVE ng/mL (ref <50)
LORAZEPAMU: 201 ng/mL — AB (ref <50)
Midazolam (GC/LC/MS), ur confirm: NEGATIVE ng/mL (ref <50)
NORDIAZEPAMU: NEGATIVE ng/mL (ref <50)
OXAZEPAMU: NEGATIVE ng/mL (ref <50)
TEMAZEPAMU: NEGATIVE ng/mL (ref <50)
TRIAZOLAMU: NEGATIVE ng/mL (ref <50)

## 2014-10-17 LAB — TRAMADOL, URINE
N-DESMETHYL-CIS-TRAMADOL: 9495 ng/mL (ref <100)
Tramadol, Urine: 47929 ng/mL (ref <100)

## 2014-10-18 LAB — PRESCRIPTION MONITORING PROFILE (SOLSTAS)
Amphetamine/Meth: NEGATIVE ng/mL
BARBITURATE SCREEN, URINE: NEGATIVE ng/mL
Buprenorphine, Urine: NEGATIVE ng/mL
CARISOPRODOL, URINE: NEGATIVE ng/mL
Cannabinoid Scrn, Ur: NEGATIVE ng/mL
Cocaine Metabolites: NEGATIVE ng/mL
Creatinine, Urine: 38.54 mg/dL (ref >20.0)
Fentanyl, Ur: NEGATIVE ng/mL
MDMA URINE: NEGATIVE ng/mL
Meperidine, Ur: NEGATIVE ng/mL
Methadone Screen, Urine: NEGATIVE ng/mL
NITRITES URINE, INITIAL: NEGATIVE ug/mL
OPIATE SCREEN, URINE: NEGATIVE ng/mL
OXYCODONE SCRN UR: NEGATIVE ng/mL
PH URINE, INITIAL: 4.9 pH (ref 4.5–8.9)
PROPOXYPHENE: NEGATIVE ng/mL
Tapentadol, urine: NEGATIVE ng/mL
ZOLPIDEM, URINE: NEGATIVE ng/mL

## 2014-10-27 ENCOUNTER — Telehealth: Payer: Self-pay | Admitting: *Deleted

## 2014-10-27 NOTE — Telephone Encounter (Signed)
We can call in tylenol #3 one po TID prn, #90. 2 RF Inform pt that if we find evidence of lorazepam or other controlled substances, will not be able to cont precribing pain meds

## 2014-10-27 NOTE — Telephone Encounter (Signed)
His wife has the rx and he has take it a couple times in past to help him sleep since tramadol will not allow him to sleep.

## 2014-10-27 NOTE — Telephone Encounter (Signed)
[facility] shows Lorazepam, not listed as a med, pt neds to show [place] pill btl or Rx for this med before we can RX T #3

## 2014-10-27 NOTE — Telephone Encounter (Signed)
Pt has called back asking about the status of his urine drug screen and his script for Tylenol 3. Wants to know how much longer its going to take before he gets his script filled

## 2014-11-03 MED ORDER — ACETAMINOPHEN-CODEINE #3 300-30 MG PO TABS: 1.0000 | ORAL_TABLET | Freq: Three times a day (TID) | ORAL | Status: DC | PRN

## 2014-11-03 NOTE — Telephone Encounter (Signed)
Notified Fernando Anthony and called Tyl #3 to his pharmacy.  DIscussed need to take no other medications other than what is prescribed for him. He verbalized understanding.

## 2014-12-17 ENCOUNTER — Other Ambulatory Visit: Payer: Self-pay | Admitting: Family Medicine

## 2014-12-17 DIAGNOSIS — L97529 Non-pressure chronic ulcer of other part of left foot with unspecified severity: Secondary | ICD-10-CM

## 2014-12-23 ENCOUNTER — Ambulatory Visit
Admission: RE | Admit: 2014-12-23 | Discharge: 2014-12-23 | Disposition: A | Discharge status: Home / Self Care | Payer: BLUE CROSS/BLUE SHIELD | Source: Ambulatory Visit | Attending: Family Medicine | Admitting: Family Medicine

## 2014-12-23 ENCOUNTER — Encounter (INDEPENDENT_AMBULATORY_CARE_PROVIDER_SITE_OTHER): Payer: Self-pay

## 2014-12-23 DIAGNOSIS — L97529 Non-pressure chronic ulcer of other part of left foot with unspecified severity: Secondary | ICD-10-CM

## 2014-12-28 ENCOUNTER — Ambulatory Visit (HOSPITAL_COMMUNITY)
Admission: RE | Admit: 2014-12-28 | Discharge: 2014-12-28 | Disposition: A | Discharge status: Home / Self Care | Payer: BLUE CROSS/BLUE SHIELD | Source: Ambulatory Visit | Attending: Internal Medicine | Admitting: Internal Medicine

## 2014-12-28 ENCOUNTER — Other Ambulatory Visit: Payer: Self-pay | Admitting: Internal Medicine

## 2014-12-28 DIAGNOSIS — Z720 Tobacco use: Secondary | ICD-10-CM | POA: Diagnosis not present

## 2014-12-28 DIAGNOSIS — R5383 Other fatigue: Secondary | ICD-10-CM

## 2014-12-28 DIAGNOSIS — R0602 Shortness of breath: Secondary | ICD-10-CM

## 2015-01-05 ENCOUNTER — Ambulatory Visit: Payer: BC Managed Care – PPO | Admitting: Physical Medicine & Rehabilitation

## 2015-01-09 ENCOUNTER — Ambulatory Visit: Payer: BLUE CROSS/BLUE SHIELD | Admitting: Physical Medicine & Rehabilitation

## 2015-01-09 ENCOUNTER — Ambulatory Visit: Payer: BLUE CROSS/BLUE SHIELD

## 2015-01-26 ENCOUNTER — Ambulatory Visit: Payer: BC Managed Care – PPO | Admitting: Physical Medicine & Rehabilitation

## 2015-01-27 ENCOUNTER — Other Ambulatory Visit: Payer: Self-pay | Admitting: *Deleted

## 2015-01-27 DIAGNOSIS — I7025 Atherosclerosis of native arteries of other extremities with ulceration: Secondary | ICD-10-CM

## 2015-02-09 ENCOUNTER — Encounter: Payer: Self-pay | Admitting: Vascular Surgery

## 2015-02-10 ENCOUNTER — Ambulatory Visit (INDEPENDENT_AMBULATORY_CARE_PROVIDER_SITE_OTHER): Payer: BLUE CROSS/BLUE SHIELD | Admitting: Vascular Surgery

## 2015-02-10 ENCOUNTER — Ambulatory Visit (HOSPITAL_COMMUNITY)
Admission: RE | Admit: 2015-02-10 | Discharge: 2015-02-10 | Disposition: A | Discharge status: Home / Self Care | Payer: BLUE CROSS/BLUE SHIELD | Source: Ambulatory Visit | Attending: Vascular Surgery | Admitting: Vascular Surgery

## 2015-02-10 ENCOUNTER — Encounter: Payer: Self-pay | Admitting: Vascular Surgery

## 2015-02-10 VITALS — BP 160/91 | HR 70 | Resp 16 | Ht 74.0 in | Wt 207.0 lb

## 2015-02-10 DIAGNOSIS — E785 Hyperlipidemia, unspecified: Secondary | ICD-10-CM | POA: Diagnosis not present

## 2015-02-10 DIAGNOSIS — L97521 Non-pressure chronic ulcer of other part of left foot limited to breakdown of skin: Secondary | ICD-10-CM | POA: Diagnosis not present

## 2015-02-10 DIAGNOSIS — E119 Type 2 diabetes mellitus without complications: Secondary | ICD-10-CM | POA: Insufficient documentation

## 2015-02-10 DIAGNOSIS — L97409 Non-pressure chronic ulcer of unspecified heel and midfoot with unspecified severity: Secondary | ICD-10-CM

## 2015-02-10 DIAGNOSIS — I7025 Atherosclerosis of native arteries of other extremities with ulceration: Secondary | ICD-10-CM

## 2015-02-10 NOTE — Progress Notes (Signed)
Subjective:     Patient ID: Fernando Anthony, male   DOB: Jul 09, 1956, 59 y.o.   MRN: [id number]  HPI this 59 year old male was referred for evaluation of an ulcer in the left first toe which is been present for approximately 9 months. Patient attributes this to his job which requires him to wear steel plated bruits. He thinks that the boot rubbed a sore on his left first toe. He had a similar situation a few years ago on the right foot which eventually required right first toe amputation by Dr. Meridee Score. He has not been to the wound center. The ulceration has slowly gotten slightly larger but has not been infected or has had drainage. He denies claudication symptoms. He does have a long history of tobacco abuse 1 pack per day for 40+ years.  Past Medical History  Diagnosis Date  . Diabetes mellitus   . Hyperlipidemia   . [id number]     general    History  Substance Use Topics  . Smoking status: Current Every Day Smoker  . Smokeless tobacco: Never Used  . Alcohol Use: 0.0 oz/week    0 Standard drinks or equivalent per week     Comment: occasional beer    Family History  Problem Relation Age of Onset  . Cancer Mother   . Heart disease Father     Allergies  Allergen Reactions  . Vicodin [Hydrocodone-Acetaminophen] Itching     Current outpatient prescriptions:  .  amitriptyline (ELAVIL) 25 MG tablet, Take 25 mg by mouth at bedtime., Disp: , Rfl:  .  aspirin 81 MG tablet, Take 81 mg by mouth daily., Disp: , Rfl:  .  insulin glargine (LANTUS) 100 UNIT/ML injection, Inject 35 Units into the skin 2 (two) times daily. , Disp: , Rfl:  .  lisinopril (PRINIVIL,ZESTRIL) 20 MG tablet, Take 20 mg by mouth daily., Disp: , Rfl:  .  metFORMIN (GLUCOPHAGE) 500 MG tablet, Take 500 mg by mouth 2 (two) times daily with a meal., Disp: , Rfl:  .  Multiple Vitamin (MULTIVITAMIN WITH MINERALS) TABS tablet, Take 1 tablet by mouth daily., Disp: , Rfl:  .  Omega-3 Fatty Acids (FISH OIL) 1200 MG CAPS,  Take 1,200 mg by mouth daily., Disp: , Rfl:  .  oxyCODONE-acetaminophen (PERCOCET) 10-325 MG per tablet, Take 1 tablet by mouth every 6 (six) hours as needed for pain., Disp: , Rfl:  .  rosuvastatin (CRESTOR) 10 MG tablet, Take 10 mg by mouth daily., Disp: , Rfl:  .  acetaminophen-codeine (TYLENOL #3) 300-30 MG per tablet, Take 1 tablet by mouth every 8 (eight) hours as needed for moderate pain. One month supply, Disp: 90 tablet, Rfl: 2 .  cyclobenzaprine (FLEXERIL) 10 MG tablet, Take 1 tablet (10 mg total) by mouth at bedtime. (Patient not taking: Reported on 02/10/2015), Disp: 30 tablet, Rfl: 1 .  gabapentin (NEURONTIN) 100 MG capsule, Take 1 capsule (100 mg total) by mouth 3 (three) times daily. (Patient not taking: Reported on 02/10/2015), Disp: 90 capsule, Rfl: 0 .  traMADol (ULTRAM) 50 MG tablet, Take 2 Tablets every 6 hours as needed for pain (Patient not taking: Reported on 02/10/2015), Disp: 240 tablet, Rfl: 5  Filed Vitals:   02/10/15 1330 02/10/15 1338  BP: 160/85 160/91  Pulse: 71 70  Resp: 16   Height: 6\' 2"  (1.88 m)   Weight: 207 lb (93.895 kg)     Body mass index is 26.57 kg/(m^2).  Review of Systems denies chest pain, dyspnea on exertion, PND, orthopnea, hemoptysis, claudication. All other systems negative and a complete review of systems. Patient does have history of tobacco abuse 40+ pack years     Objective:   Physical Exam BP 160/91 mmHg  Pulse 70  Resp 16  Ht 6\' 2"  (1.88 m)  Wt 207 lb (93.895 kg)  BMI 26.57 kg/m2  Gen.-alert and oriented x3 in no apparent distress HEENT normal for age Lungs no rhonchi or wheezing Cardiovascular regular rhythm no murmurs carotid pulses 3+ palpable no bruits audible Abdomen soft nontender no palpable masses Musculoskeletal free of  major deformities Skin clear -no rashes Neurologic normal Lower extremities 3+ femoral and popliteal pulses palpable bilaterally. 2+ dorsalis pedis pulse palpable bilaterally. Right first  toe amputation well healed Left first toe has dry callused ulcer on medial plantar aspect about 1-1/2 cm in length. No drainage or erythema or fluctuance noted.  Today I ordered a duplex scan of the arterial system of both lower extremities which revealed superficial femoral and popliteal arteries to be widely patent with some evidence of plaque in the tibial vessels but in general layer widely patent also. Also reviewed the previous lower extremity arterial Doppler report from February 2 at another vascular lab which revealed ABI of 1.18 on the left and 1.26 on the right at rest.       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 Meridee Score to attempt to below the pressure on the left first toe. May need some localized debridement. Return to see me on when necessary basis Strongly encouraged patient to discontinue his smoking

## 2015-02-10 NOTE — Progress Notes (Signed)
Filed Vitals:   02/10/15 1330 02/10/15 1338  BP: 160/85 160/91  Pulse: 71 70  Resp: 16   Height: 6\' 2"  (1.88 m)   Weight: 207 lb (93.895 kg)   Body mass index is 26.57 kg/(m^2).

## 2017-06-17 ENCOUNTER — Emergency Department (HOSPITAL_BASED_OUTPATIENT_CLINIC_OR_DEPARTMENT_OTHER)
Admission: EM | Admit: 2017-06-17 | Discharge: 2017-06-17 | Discharge status: Against Medical Advice | Payer: BLUE CROSS/BLUE SHIELD | Attending: Emergency Medicine | Admitting: Emergency Medicine

## 2017-06-17 ENCOUNTER — Encounter (HOSPITAL_BASED_OUTPATIENT_CLINIC_OR_DEPARTMENT_OTHER): Payer: Self-pay | Admitting: *Deleted

## 2017-06-17 DIAGNOSIS — E1140 Type 2 diabetes mellitus with diabetic neuropathy, unspecified: Secondary | ICD-10-CM | POA: Diagnosis not present

## 2017-06-17 DIAGNOSIS — Z79899 Other long term (current) drug therapy: Secondary | ICD-10-CM | POA: Insufficient documentation

## 2017-06-17 DIAGNOSIS — Z760 Encounter for issue of repeat prescription: Secondary | ICD-10-CM | POA: Diagnosis present

## 2017-06-17 DIAGNOSIS — F17200 Nicotine dependence, unspecified, uncomplicated: Secondary | ICD-10-CM | POA: Diagnosis not present

## 2017-06-17 DIAGNOSIS — E1165 Type 2 diabetes mellitus with hyperglycemia: Secondary | ICD-10-CM | POA: Diagnosis not present

## 2017-06-17 DIAGNOSIS — Z7984 Long term (current) use of oral hypoglycemic drugs: Secondary | ICD-10-CM | POA: Diagnosis not present

## 2017-06-17 DIAGNOSIS — Z89421 Acquired absence of other right toe(s): Secondary | ICD-10-CM | POA: Insufficient documentation

## 2017-06-17 DIAGNOSIS — I10 Essential (primary) hypertension: Secondary | ICD-10-CM | POA: Insufficient documentation

## 2017-06-17 DIAGNOSIS — Z794 Long term (current) use of insulin: Secondary | ICD-10-CM | POA: Insufficient documentation

## 2017-06-17 DIAGNOSIS — R739 Hyperglycemia, unspecified: Secondary | ICD-10-CM

## 2017-06-17 HISTORY — DX: Essential (primary) hypertension: I10

## 2017-06-17 HISTORY — DX: Dorsalgia, unspecified: M54.9

## 2017-06-17 LAB — CBG MONITORING, ED: Glucose-Capillary: >600 mg/dL (ref 65–99)

## 2017-06-17 MED ORDER — METFORMIN HCL 500 MG PO TABS: 500.0000 mg | ORAL_TABLET | Freq: Two times a day (BID) | ORAL | 0 refills | Status: DC

## 2017-06-17 MED ORDER — INSULIN GLARGINE 100 UNIT/ML ~~LOC~~ SOLN: 35.0000 [IU] | Freq: Two times a day (BID) | SUBCUTANEOUS | 0 refills | Status: DC

## 2017-06-17 NOTE — ED Triage Notes (Signed)
Pt presents to ED needing a refill of Lantus -- states he's been out for approx 1 wk. CBG >600 in triage.

## 2017-06-17 NOTE — ED Notes (Signed)
Pt needs refill for lantus

## 2017-06-17 NOTE — ED Provider Notes (Signed)
Patient requesting refills for Lantus insulin and metformin. He feels that his blood sugars high. He refuses further diagnostic testing or treatment. I explained to him that he has risk of organ failure  or death. Patient understands these concepts. We will write him prescriptions for insulin and metformin. He is referred to primary care   Orlie Dakin, MD 06/17/17 [phone]

## 2017-06-17 NOTE — ED Notes (Signed)
Attempted to have pt sign out.  Signature pad not working.  Pt verbalized understanding-Tyler, PA spoke with pt about AMA,

## 2017-06-17 NOTE — ED Provider Notes (Signed)
[facility] DEPT [facility] Provider Note   CSN: [id number] Arrival date & time: 06/17/17  1603     History   Chief Complaint Chief Complaint  Patient presents with  . Medication Refill  . Hyperglycemia    HPI Fernando Anthony is a 61 y.o. male.  HPI  61 y.o. male with a hx of DM, HTN, HLD, presents to the Emergency Department today due to medication refill. Pt states that he has been out of Lantus x 1 week and has had hyperglycemic symptoms consisting of polyuria and polydipsia.  Denies pain. No CP/SOB/ABD pain. No N/V. No headaches or vision changes. Pt states he has been diabetic for 20+ years. Pt attempted to contact PCP, but states that they would not see as he has not been seen for several months. No fevers. No other symptoms noted.   Past Medical History:  Diagnosis Date  . Back pain   . Diabetes mellitus   . Headache(784.0)    general  . Hyperlipidemia   . Hypertension     Patient Active Problem List   Diagnosis Date Noted  . Lumbar post-laminectomy syndrome 09/16/2014  . Diabetic neuropathy ([place] [facility]) 04/04/2013  . Postlaminectomy syndrome, lumbar region 05/18/2012  . Diabetes mellitus ([place]) 05/18/2012    Past Surgical History:  Procedure Laterality Date  . AMPUTATION Right 09/18/2013   Procedure: Amputation Right Great Toe at [facility];  Surgeon: Newt Minion, MD;  Location: [facility];  Service: Orthopedics;  Laterality: Right;  Amputation Right Great Toe at [facility]  . CARDIAC CATHETERIZATION  ?1990  . SPINE SURGERY         Home Medications    Prior to Admission medications   Medication Sig Start Date End Date Taking? Authorizing Provider  insulin glargine (LANTUS) 100 UNIT/ML injection Inject 35 Units into the skin 2 (two) times daily.    Yes [provider]  METHADONE HCL PO Take by mouth.   Yes [provider]  Multiple Vitamin (MULTIVITAMIN WITH MINERALS) TABS tablet Take 1 tablet by mouth daily.   Yes [provider]  acetaminophen-codeine  (TYLENOL #3) 300-30 MG per tablet Take 1 tablet by mouth every 8 (eight) hours as needed for moderate pain. One month supply 11/03/14   Kirsteins, Luanna Salk, MD  amitriptyline (ELAVIL) 25 MG tablet Take 25 mg by mouth at bedtime.    [provider]  aspirin 81 MG tablet Take 81 mg by mouth daily.    [provider]  cyclobenzaprine (FLEXERIL) 10 MG tablet Take 1 tablet (10 mg total) by mouth at bedtime. Patient not taking: Reported on 02/10/2015 09/16/14   Charlett Blake, MD  gabapentin (NEURONTIN) 100 MG capsule Take 1 capsule (100 mg total) by mouth 3 (three) times daily. Patient not taking: Reported on 02/10/2015 09/16/14   Charlett Blake, MD  lisinopril (PRINIVIL,ZESTRIL) 20 MG tablet Take 20 mg by mouth daily.    [provider]  metFORMIN (GLUCOPHAGE) 500 MG tablet Take 500 mg by mouth 2 (two) times daily with a meal.    [provider]  Omega-3 Fatty Acids (FISH OIL) 1200 MG CAPS Take 1,200 mg by mouth daily.    [provider]  oxyCODONE-acetaminophen (PERCOCET) 10-325 MG per tablet Take 1 tablet by mouth every 6 (six) hours as needed for pain.    [provider]  rosuvastatin (CRESTOR) 10 MG tablet Take 10 mg by mouth daily.    [provider]  traMADol (ULTRAM) 50 MG tablet Take 2  Tablets every 6 hours as needed for pain Patient not taking: Reported on 02/10/2015 07/25/14   Bayard Hugger, NP    Family History Family History  Problem Relation Age of Onset  . Cancer Mother   . Heart disease Father     Social History Social History  Substance Use Topics  . Smoking status: Current Every Day Smoker  . Smokeless tobacco: Never Used  . Alcohol use 0.0 oz/week     Comment: occasional beer     Allergies   Vicodin [hydrocodone-acetaminophen]   Review of Systems Review of Systems ROS reviewed and all are negative for acute change except as noted in the HPI.  Physical Exam Updated Vital Signs BP (!) 158/83  (BP Location: Left Arm)   Pulse 75   Temp 98.8 F (37.1 C) (Oral)   Resp 19   SpO2 100%   Physical Exam  Constitutional: He is oriented to person, place, and time. Vital signs are normal. He appears well-developed and well-nourished.  HENT:  Head: Normocephalic and atraumatic.  Right Ear: Hearing normal.  Left Ear: Hearing normal.  Eyes: Pupils are equal, round, and reactive to light. Conjunctivae and EOM are normal.  Neck: Normal range of motion. Neck supple.  Cardiovascular: Normal rate, regular rhythm, normal heart sounds and intact distal pulses.   Pulmonary/Chest: Effort normal and breath sounds normal.  Abdominal: Soft. There is no tenderness.  Musculoskeletal: Normal range of motion.  Neurological: He is alert and oriented to person, place, and time. He has normal strength. No cranial nerve deficit or sensory deficit.  Cranial Nerves:  II: Pupils equal, round, reactive to light III,IV, VI: ptosis not present, extra-ocular motions intact bilaterally  V,VII: smile symmetric, facial light touch sensation equal VIII: hearing grossly normal bilaterally  IX,X: midline uvula rise  XI: bilateral shoulder shrug equal and strong XII: midline tongue extension  Skin: Skin is warm and dry.  Psychiatric: He has a normal mood and affect. His speech is normal and behavior is normal. Thought content normal.  Nursing note and vitals reviewed.  ED Treatments / Results  Labs (all labs ordered are listed, but only abnormal results are displayed) Labs Reviewed  CBG MONITORING, ED - Abnormal; Notable for the following:       Result Value   Glucose-Capillary >600 (*)    All other components within normal limits    EKG  EKG Interpretation None       Radiology No results found.  Procedures Procedures (including critical care time)  Medications Ordered in ED Medications - No data to display   Initial Impression / Assessment and Plan / ED Course  I have reviewed the triage  vital signs and the nursing notes.  Pertinent labs & imaging results that were available during my care of the patient were reviewed by me and considered in my medical decision making (see chart for details).  Final Clinical Impressions(s) / ED Diagnoses  {I have reviewed and evaluated the relevant laboratory values.   {I have reviewed the relevant previous healthcare records.  {I obtained HPI from historian.   ED Course:  Assessment: Pt is a 61 y.o. male with a hx of DM, HTN, HLD, presents to the Emergency Department today due to medication refill. Pt states that he has been out of Lantus x 1 week and has had hyperglycemic symptoms consisting of polyuria and polydipsia.  Denies pain. No CP/SOB/ABD pain. No N/V. No headaches or vision changes. Pt states he has been diabetic for  20+ years. Pt attempted to contact PCP, but states that they would not see as he has not been seen for several months. No fevers. On exam, pt in NAD. Nontoxic/nonseptic appearing. VSS. Afebrile. Lungs CTA. Heart RRR. Abdomen nontender soft. CBG with glucose >600. Pt is asymptomatic other than polyuria or polydipsia. No N/V. No communicating well and is sitting upright in bed and conversing well. Discussed the possible need of further evaluation with labs and giving fluids, but patient declined and just wishes to have refill of meds and go home. Discussed with attending physician who has seen patient. Discussed with a patient that he will be leaving AMA without proper evaluation and pt accepts. Will Rx medication. Given strict return precautions.  Plan is to DC home AMA. At time of discharge, Patient is in no acute distress. Vital Signs are stable. Patient is able to ambulate. Patient able to tolerate PO.   Disposition/Plan:  Dc Home Additional Verbal discharge instructions given and discussed with patient.  Pt Instructed to f/u with PCP in the next week for evaluation and treatment of symptoms. Return precautions given Pt  acknowledges and agrees with plan  Supervising Physician Orlie Dakin, MD  Final diagnoses:  Hyperglycemia    New Prescriptions New Prescriptions   No medications on file     Conni Slipper 06/17/17 [place]    Orlie Dakin, MD 06/18/17 0005

## 2017-06-17 NOTE — Discharge Instructions (Signed)
Please read and follow all provided instructions.  Your diagnoses today include:  1. Hyperglycemia     Tests performed today include: Vital signs. See below for your results today.   Medications prescribed:  Take as prescribed   Home care instructions:  Follow any educational materials contained in this packet.  Follow-up instructions: Please follow-up with your primary care provider for further evaluation of symptoms and treatment   Return instructions:  Please return to the Emergency Department if you do not get better, if you get worse, or new symptoms OR  - Fever (temperature greater than 101.78F)  - Bleeding that does not stop with holding pressure to the area    -Severe pain (please note that you may be more sore the day after your accident)  - Chest Pain  - Difficulty breathing  - Severe nausea or vomiting  - Inability to tolerate food and liquids  - Passing out  - Skin becoming red around your wounds  - Change in mental status (confusion or lethargy)  - New numbness or weakness    Please return if you have any other emergent concerns.  Additional Information:  By leaving against medical advice, you are at risk for kidney failure, organ failure and worse could be death. Please return to the Emergency Department for worsening symptoms   Your vital signs today were: BP (!) 158/83 (BP Location: Left Arm)    Pulse 75    Temp 98.8 F (37.1 C) (Oral)    Resp 19    SpO2 100%  If your blood pressure (BP) was elevated above 135/85 this visit, please have this repeated by your doctor within one month. ---------------

## 2017-06-17 NOTE — ED Triage Notes (Signed)
Pt reports polyuria, polydipsia.

## 2017-08-20 DIAGNOSIS — IMO0002 Reserved for concepts with insufficient information to code with codable children: Secondary | ICD-10-CM | POA: Insufficient documentation

## 2017-08-20 DIAGNOSIS — E1149 Type 2 diabetes mellitus with other diabetic neurological complication: Secondary | ICD-10-CM | POA: Insufficient documentation

## 2017-08-20 DIAGNOSIS — E876 Hypokalemia: Secondary | ICD-10-CM | POA: Insufficient documentation

## 2017-08-20 DIAGNOSIS — I10 Essential (primary) hypertension: Secondary | ICD-10-CM | POA: Insufficient documentation

## 2018-09-19 ENCOUNTER — Encounter (HOSPITAL_BASED_OUTPATIENT_CLINIC_OR_DEPARTMENT_OTHER): Payer: Self-pay

## 2018-09-19 ENCOUNTER — Emergency Department (HOSPITAL_BASED_OUTPATIENT_CLINIC_OR_DEPARTMENT_OTHER): Payer: BLUE CROSS/BLUE SHIELD

## 2018-09-19 ENCOUNTER — Emergency Department (HOSPITAL_BASED_OUTPATIENT_CLINIC_OR_DEPARTMENT_OTHER)
Admission: EM | Admit: 2018-09-19 | Discharge: 2018-09-20 | Disposition: A | Discharge status: Home / Self Care | Payer: BLUE CROSS/BLUE SHIELD | Attending: Emergency Medicine | Admitting: Emergency Medicine

## 2018-09-19 ENCOUNTER — Other Ambulatory Visit: Payer: Self-pay

## 2018-09-19 DIAGNOSIS — Z79899 Other long term (current) drug therapy: Secondary | ICD-10-CM | POA: Diagnosis not present

## 2018-09-19 DIAGNOSIS — L03115 Cellulitis of right lower limb: Secondary | ICD-10-CM | POA: Diagnosis not present

## 2018-09-19 DIAGNOSIS — Z794 Long term (current) use of insulin: Secondary | ICD-10-CM | POA: Diagnosis not present

## 2018-09-19 DIAGNOSIS — F17210 Nicotine dependence, cigarettes, uncomplicated: Secondary | ICD-10-CM | POA: Diagnosis not present

## 2018-09-19 DIAGNOSIS — E08621 Diabetes mellitus due to underlying condition with foot ulcer: Secondary | ICD-10-CM

## 2018-09-19 DIAGNOSIS — R2241 Localized swelling, mass and lump, right lower limb: Secondary | ICD-10-CM | POA: Diagnosis present

## 2018-09-19 DIAGNOSIS — E11621 Type 2 diabetes mellitus with foot ulcer: Secondary | ICD-10-CM | POA: Diagnosis not present

## 2018-09-19 DIAGNOSIS — L97511 Non-pressure chronic ulcer of other part of right foot limited to breakdown of skin: Secondary | ICD-10-CM | POA: Insufficient documentation

## 2018-09-19 DIAGNOSIS — I10 Essential (primary) hypertension: Secondary | ICD-10-CM | POA: Insufficient documentation

## 2018-09-19 LAB — I-STAT CG4 LACTIC ACID, ED: Lactic Acid, Venous: 0.52 mmol/L (ref 0.5–1.9)

## 2018-09-19 LAB — CBC WITH DIFFERENTIAL/PLATELET
Abs Immature Granulocytes: 0.06 10*3/uL (ref 0.00–0.07)
Basophils Absolute: 0 10*3/uL (ref 0.0–0.1)
Basophils Relative: 0 %
EOS ABS: 0.1 10*3/uL (ref 0.0–0.5)
EOS PCT: 1 %
HCT: 34.4 % — ABNORMAL LOW (ref 39.0–52.0)
Hemoglobin: 11 g/dL — ABNORMAL LOW (ref 13.0–17.0)
Immature Granulocytes: 0 %
Lymphocytes Relative: 9 %
Lymphs Abs: 1.3 10*3/uL (ref 0.7–4.0)
MCH: 31.8 pg (ref 26.0–34.0)
MCHC: 32 g/dL (ref 30.0–36.0)
MCV: 99.4 fL (ref 80.0–100.0)
MONO ABS: 1.7 10*3/uL — AB (ref 0.1–1.0)
Monocytes Relative: 11 %
Neutro Abs: 12.3 10*3/uL — ABNORMAL HIGH (ref 1.7–7.7)
Neutrophils Relative %: 79 %
Platelets: 266 10*3/uL (ref 150–400)
RBC: 3.46 MIL/uL — ABNORMAL LOW (ref 4.22–5.81)
RDW: 12.2 % (ref 11.5–15.5)
WBC: 15.5 10*3/uL — AB (ref 4.0–10.5)
nRBC: 0 % (ref 0.0–0.2)

## 2018-09-19 LAB — BASIC METABOLIC PANEL
Anion gap: 8 (ref 5–15)
BUN: 24 mg/dL — AB (ref 6–20)
CALCIUM: 8.7 mg/dL — AB (ref 8.9–10.3)
CO2: 26 mmol/L (ref 22–32)
CREATININE: 0.79 mg/dL (ref 0.61–1.24)
Chloride: 97 mmol/L — ABNORMAL LOW (ref 98–111)
GFR calc Af Amer: >60 mL/min (ref >60)
GLUCOSE: 136 mg/dL — AB (ref 70–99)
POTASSIUM: 3.6 mmol/L (ref 3.5–5.1)
Sodium: 131 mmol/L — ABNORMAL LOW (ref 135–145)

## 2018-09-19 LAB — CBG MONITORING, ED: Glucose-Capillary: 133 mg/dL — ABNORMAL HIGH (ref 70–99)

## 2018-09-19 MED ORDER — SODIUM CHLORIDE 0.9 % IV SOLN
INTRAVENOUS | Status: DC | PRN
  Administered 2018-09-19: 500 mL via INTRAVENOUS

## 2018-09-19 MED ORDER — VANCOMYCIN HCL IN DEXTROSE 1-5 GM/200ML-% IV SOLN
1000.0000 mg | Freq: Once | INTRAVENOUS | Status: AC
  Administered 2018-09-20: 1000 mg via INTRAVENOUS
  Filled 2018-09-19: qty 200

## 2018-09-19 MED ORDER — SODIUM CHLORIDE 0.9 % IV SOLN
1.0000 g | Freq: Once | INTRAVENOUS | Status: AC
  Administered 2018-09-19: 1 g via INTRAVENOUS
  Filled 2018-09-19: qty 10

## 2018-09-19 NOTE — ED Provider Notes (Signed)
TIME SEEN: 11:15 PM  CHIEF COMPLAINT: Cellulitis  HPI: Patient is a 62 year old male with history of diabetes, hypertension, hyperlipidemia, previous right great toe amputation approximately 5 years ago who presents to the emergency department with an ulcer to the medial aspect of the right foot that has been ongoing for several days and now has had redness that spreads up to his anterior ankle.  No fevers, vomiting.  States his blood sugars have been running high.  No injury to the foot.  ROS: See HPI Constitutional: no fever  Eyes: no drainage  ENT: no runny nose   Cardiovascular:  no chest pain  Resp: no SOB  GI: no vomiting GU: no dysuria Integumentary: no rash  Allergy: no hives  Musculoskeletal: no leg swelling  Neurological: no slurred speech ROS otherwise negative  PAST MEDICAL HISTORY/PAST SURGICAL HISTORY:  Past Medical History:  Diagnosis Date  . Back pain   . Diabetes mellitus   . Headache(784.0)    general  . Hyperlipidemia   . Hypertension     MEDICATIONS:  Prior to Admission medications   Medication Sig Start Date End Date Taking? Authorizing Provider  acetaminophen-codeine (TYLENOL #3) 300-30 MG per tablet Take 1 tablet by mouth every 8 (eight) hours as needed for moderate pain. One month supply 11/03/14   Kirsteins, Luanna Salk, MD  amitriptyline (ELAVIL) 25 MG tablet Take 25 mg by mouth at bedtime.    [provider]  aspirin 81 MG tablet Take 81 mg by mouth daily.    [provider]  cyclobenzaprine (FLEXERIL) 10 MG tablet Take 1 tablet (10 mg total) by mouth at bedtime. Patient not taking: Reported on 02/10/2015 09/16/14   Charlett Blake, MD  gabapentin (NEURONTIN) 100 MG capsule Take 1 capsule (100 mg total) by mouth 3 (three) times daily. Patient not taking: Reported on 02/10/2015 09/16/14   Charlett Blake, MD  insulin glargine (LANTUS) 100 UNIT/ML injection Inject 0.35 mLs (35 Units total) into the skin 2 (two) times daily.  06/17/17   Shary Decamp, PA-C  lisinopril (PRINIVIL,ZESTRIL) 20 MG tablet Take 20 mg by mouth daily.    [provider]  metFORMIN (GLUCOPHAGE) 500 MG tablet Take 1 tablet (500 mg total) by mouth 2 (two) times daily with a meal. 06/17/17   Shary Decamp, PA-C  METHADONE HCL PO Take by mouth.    [provider]  Multiple Vitamin (MULTIVITAMIN WITH MINERALS) TABS tablet Take 1 tablet by mouth daily.    [provider]  Omega-3 Fatty Acids (FISH OIL) 1200 MG CAPS Take 1,200 mg by mouth daily.    [provider]  oxyCODONE-acetaminophen (PERCOCET) 10-325 MG per tablet Take 1 tablet by mouth every 6 (six) hours as needed for pain.    [provider]  rosuvastatin (CRESTOR) 10 MG tablet Take 10 mg by mouth daily.    [provider]  traMADol (ULTRAM) 50 MG tablet Take 2 Tablets every 6 hours as needed for pain Patient not taking: Reported on 02/10/2015 07/25/14   Bayard Hugger, NP    ALLERGIES:  Allergies  Allergen Reactions  . Vicodin [Hydrocodone-Acetaminophen] Itching    SOCIAL HISTORY:  Social History   Tobacco Use  . Smoking status: Current Every Day Smoker    Types: Cigarettes  . Smokeless tobacco: Never Used  Substance Use Topics  . Alcohol use: Not Currently    FAMILY HISTORY: Family History  Problem Relation Age of Onset  . Cancer Mother   . Heart  disease Father     EXAM: BP (!) 178/88 (BP Location: Right Arm)   Pulse 98   Temp 98.5 F (36.9 C) (Oral)   Resp 18   Ht 6\' 1"  (1.854 m)   Wt 83.9 kg   SpO2 99%   BMI 24.41 kg/m  CONSTITUTIONAL: Alert and oriented and responds appropriately to questions. Well-appearing; well-nourished HEAD: Normocephalic EYES: Conjunctivae clear, pupils appear equal, EOMI ENT: normal nose; moist mucous membranes NECK: Supple, no meningismus, no nuchal rigidity, no LAD  CARD: RRR; S1 and S2 appreciated; no murmurs, no clicks, no rubs, no gallops RESP: Normal chest excursion without  splinting or tachypnea; breath sounds clear and equal bilaterally; no wheezes, no rhonchi, no rales, no hypoxia or respiratory distress, speaking full sentences ABD/GI: Normal bowel sounds; non-distended; soft, non-tender, no rebound, no guarding, no peritoneal signs, no hepatosplenomegaly BACK:  The back appears normal and is non-tender to palpation, there is no CVA tenderness EXT: Normal ROM in all joints; no calf tenderness or calf swelling, compartments are soft, 2+ DP pulses bilaterally, redness noted from the medial aspect of the right foot into the dorsal foot and the distal right ankle without joint effusion, diabetic foot ulcer noted to the medial right foot without purulent drainage and no sign of abscess, no subcutaneous emphysema noted (see below) SKIN: Normal color for age and race; warm; no rash NEURO: Moves all extremities equally PSYCH: The patient's mood and manner are appropriate. Grooming and personal hygiene are appropriate.  MEDICAL DECISION MAKING: Patient here with diabetic foot ulcer that now has signs of cellulitis.  No abscess for drainage.  He has no systemic symptoms.  Will obtain labs, x-ray and give vancomycin, ceftriaxone.  He declines pain medication at this time.  ED PROGRESS: Labs reassuring.  He does have a leukocytosis which is expected.  Lactate is normal.  Blood sugar is normal.  Have offered admission for symptom management the patient states he would like to try oral antibiotics as an outpatient.  I do not feel this is unreasonable.  He has a PCP, Dr. Neta Mends, for close follow-up.  We will also give podiatry follow-up information.  Discussed at length return precautions.  Will provide with work note.  Patient and wife are comfortable with this plan.  At this time, I do not feel there is any life-threatening condition present. I have reviewed and discussed all results (EKG, imaging, lab, urine as appropriate) and exam findings with patient/family. I have reviewed  nursing notes and appropriate previous records.  I feel the patient is safe to be discharged home without further emergent workup and can continue workup as an outpatient as needed. Discussed usual and customary return precautions. Patient/family verbalize understanding and are comfortable with this plan.  Outpatient follow-up has been provided if needed. All questions have been answered.              Delise Simenson, Delice Bison, DO 09/20/18 [phone]

## 2018-09-19 NOTE — ED Triage Notes (Signed)
Pt c/o swelling, redness to right LE x 2-3 days-started as a blister to right foot-states right great toe was amputated ~45yrs ago-pt NAD-to triage in w/c

## 2018-09-19 NOTE — ED Notes (Signed)
Patient transported to X-ray 

## 2018-09-20 MED ORDER — CEPHALEXIN 500 MG PO CAPS: 500.0000 mg | ORAL_CAPSULE | Freq: Four times a day (QID) | ORAL | 0 refills | Status: DC

## 2018-09-20 MED ORDER — SULFAMETHOXAZOLE-TRIMETHOPRIM 800-160 MG PO TABS: 1.0000 | ORAL_TABLET | Freq: Two times a day (BID) | ORAL | 0 refills | Status: AC

## 2018-09-20 NOTE — Discharge Instructions (Signed)
You may alternate Tylenol 1000 mg every 6 hours as needed for pain and Ibuprofen 800 mg every 8 hours as needed for pain.  Please take Ibuprofen with food.    To find a primary care or specialty doctor please call [phone] or [phone] to access "[facility] Doctor Service."  You may also go on the [facility] website at [email or web address]  There are also multiple [facility], [facility], [facility] and [facility] practices throughout the [place] that are frequently accepting new patients. You may find a clinic that is close to your home and contact them.  [place] [id number] [place]  [place] [address] [place] [phone]

## 2018-09-20 NOTE — ED Notes (Signed)
Pt understood dc material. NAD noted. Scripts sent electronically.

## 2018-09-22 ENCOUNTER — Ambulatory Visit (INDEPENDENT_AMBULATORY_CARE_PROVIDER_SITE_OTHER): Payer: BLUE CROSS/BLUE SHIELD | Admitting: Podiatry

## 2018-09-22 ENCOUNTER — Ambulatory Visit: Payer: BLUE CROSS/BLUE SHIELD

## 2018-09-22 ENCOUNTER — Encounter: Payer: Self-pay | Admitting: Podiatry

## 2018-09-22 VITALS — BP 194/115 | HR 78 | Resp 16

## 2018-09-22 DIAGNOSIS — I70235 Atherosclerosis of native arteries of right leg with ulceration of other part of foot: Secondary | ICD-10-CM

## 2018-09-22 DIAGNOSIS — L97512 Non-pressure chronic ulcer of other part of right foot with fat layer exposed: Secondary | ICD-10-CM | POA: Diagnosis not present

## 2018-09-22 DIAGNOSIS — E0843 Diabetes mellitus due to underlying condition with diabetic autonomic (poly)neuropathy: Secondary | ICD-10-CM | POA: Diagnosis not present

## 2018-09-22 DIAGNOSIS — M779 Enthesopathy, unspecified: Principal | ICD-10-CM

## 2018-09-22 DIAGNOSIS — M778 Other enthesopathies, not elsewhere classified: Secondary | ICD-10-CM

## 2018-09-25 NOTE — Progress Notes (Signed)
   Subjective: 62 year old male with PMHx of DM presenting today as a new patient with a chief complaint of sharp pain to the sub-first MPJ of the right foot secondary to a blister that appeared about six weeks ago. He states the blister appeared due to wearing his work boots. It has since worsened with associated swelling, redness and has gotten larger. Walking and wearing shoes increases the pain. He was seen in the ED three days ago and received IV antibiotics and is now taking oral Keflex and Bactrim DS as directed by ED provider. Patient is here for further evaluation and treatment.    Past Medical History:  Diagnosis Date  . Back pain   . Diabetes mellitus   . Headache(784.0)    general  . Hyperlipidemia   . Hypertension      Objective/Physical Exam General: The patient is alert and oriented x3 in no acute distress.  Dermatology:  Wound #1 noted to the sub-first metatarsal head of the right foot measuring 1.0 x 1.0 x 2.0 cm (LxWxD).   To the noted ulceration(s), there is no eschar. There is a moderate amount of slough, fibrin, and necrotic tissue noted. Granulation tissue and wound base is red. There is a minimal amount of serosanguineous drainage noted. Wound does probe to bone. There is no malodor. Periwound integrity is intact. Skin is warm, dry and supple bilateral lower extremities.  Vascular: Palpable pedal pulses bilaterally. No edema or erythema noted. Capillary refill within normal limits.  Neurological: Epicritic and protective threshold diminished bilaterally.   Musculoskeletal Exam: Range of motion within normal limits to all pedal and ankle joints bilateral. Muscle strength 5/5 in all groups bilateral.   Assessment: #1 Ulceration of the sub-first metatarsal head right foot secondary to diabetes mellitus #2 diabetes mellitus w/ polyneuropathy - uncontrolled  #3 h/o right great toe amputation    Plan of Care:  #1 Patient was evaluated. #2 Medically necessary  excisional debridement including muscle and deep fascial tissue was performed using a tissue nipper and a chisel blade. Excisional debridement of all the necrotic nonviable tissue down to healthy bleeding viable tissue was performed with post-debridement measurements same as pre-. #3 The wound was cleansed and dry sterile dressing applied. #4 Recommended Betadine and dry sterile dressing daily at home.  #5 Culture taken from wound.  #6 Continue Keflex QID and Bactrim DS BID as directed by ED provider.  #7 CAM boot dispensed. Minimal weightbearing.  #8 Patient is going to do light duty at work until further notice.  #9 Return to clinic in one week.   Works Architect.    Edrick Kins, DPM [facility]  Dr. Edrick Kins, [address]                                        [place], [place] [address]                Office [phone]  Fax [phone]

## 2018-09-27 LAB — WOUND CULTURE
MICRO NUMBER: 91322055
SPECIMEN QUALITY: ADEQUATE

## 2018-10-03 ENCOUNTER — Encounter: Payer: Self-pay | Admitting: Podiatry

## 2018-10-03 ENCOUNTER — Ambulatory Visit (INDEPENDENT_AMBULATORY_CARE_PROVIDER_SITE_OTHER): Payer: BLUE CROSS/BLUE SHIELD | Admitting: Podiatry

## 2018-10-03 DIAGNOSIS — E0843 Diabetes mellitus due to underlying condition with diabetic autonomic (poly)neuropathy: Secondary | ICD-10-CM | POA: Diagnosis not present

## 2018-10-03 DIAGNOSIS — L03115 Cellulitis of right lower limb: Secondary | ICD-10-CM | POA: Diagnosis not present

## 2018-10-03 DIAGNOSIS — I70235 Atherosclerosis of native arteries of right leg with ulceration of other part of foot: Secondary | ICD-10-CM | POA: Diagnosis not present

## 2018-10-03 DIAGNOSIS — L97512 Non-pressure chronic ulcer of other part of right foot with fat layer exposed: Secondary | ICD-10-CM

## 2018-10-03 MED ORDER — LEVOFLOXACIN 750 MG PO TABS: 750.0000 mg | ORAL_TABLET | Freq: Every day | ORAL | 0 refills | Status: DC

## 2018-10-07 NOTE — Progress Notes (Signed)
   Subjective: 62 year old male with PMHx of DM presenting today for follow up evaluation of an ulceration of the right foot. He states he does not believe the wound is healing. He reports significant continued pain and believes the area is still infected. He has been using Betadine daily. There are no modifying factors noted. Patient is here for further evaluation and treatment.    Past Medical History:  Diagnosis Date  . Back pain   . Diabetes mellitus   . Headache(784.0)    general  . Hyperlipidemia   . Hypertension      Objective/Physical Exam General: The patient is alert and oriented x3 in no acute distress.  Dermatology:  Wound #1 noted to the sub-first metatarsal head of the right foot measuring 1.0 x 1.0 x 1.0 cm (LxWxD).   To the noted ulceration(s), there is no eschar. There is a moderate amount of slough, fibrin, and necrotic tissue noted. Granulation tissue and wound base is red. There is a minimal amount of serosanguineous drainage noted. Wound does probe to bone. There is no malodor. Periwound integrity is intact. Skin is warm, dry and supple bilateral lower extremities. Erythema and edema localized to the right foot and ankle.   Vascular: Palpable pedal pulses bilaterally. Capillary refill within normal limits.  Neurological: Epicritic and protective threshold diminished bilaterally.   Musculoskeletal Exam: Range of motion within normal limits to all pedal and ankle joints bilateral. Muscle strength 5/5 in all groups bilateral.   Assessment: #1 Ulceration of the sub-first metatarsal head right foot secondary to diabetes mellitus #2 diabetes mellitus w/ polyneuropathy - uncontrolled  #3 h/o right great toe amputation  #4 Cellulitis right foot and ankle    Plan of Care:  #1 Patient was evaluated. Cultures reviewed.  #2 Medically necessary excisional debridement including muscle and deep fascial tissue was performed using a tissue nipper and a chisel blade.  Excisional debridement of all the necrotic nonviable tissue down to healthy bleeding viable tissue was performed with post-debridement measurements same as pre-. #3 The wound was cleansed and dry sterile dressing applied. #4 Prescription for Levaquin 750 mg daily provided to patient.  #5 Continue using CAM boot.  #6 Continue using Betadine daily.  #7 Return to clinic in 10 days.   Works Architect.    Edrick Kins, DPM [facility]  Dr. Edrick Kins, [place]                                        [place], [place] [address]                Office [phone]  Fax [phone]

## 2018-10-17 ENCOUNTER — Ambulatory Visit: Payer: BLUE CROSS/BLUE SHIELD | Admitting: Podiatry

## 2018-11-05 ENCOUNTER — Ambulatory Visit (INDEPENDENT_AMBULATORY_CARE_PROVIDER_SITE_OTHER): Payer: BLUE CROSS/BLUE SHIELD | Admitting: Podiatry

## 2018-11-05 ENCOUNTER — Ambulatory Visit (INDEPENDENT_AMBULATORY_CARE_PROVIDER_SITE_OTHER): Payer: BLUE CROSS/BLUE SHIELD

## 2018-11-05 DIAGNOSIS — E0843 Diabetes mellitus due to underlying condition with diabetic autonomic (poly)neuropathy: Secondary | ICD-10-CM

## 2018-11-05 DIAGNOSIS — I70235 Atherosclerosis of native arteries of right leg with ulceration of other part of foot: Secondary | ICD-10-CM

## 2018-11-05 DIAGNOSIS — L97512 Non-pressure chronic ulcer of other part of right foot with fat layer exposed: Secondary | ICD-10-CM | POA: Diagnosis not present

## 2018-11-08 NOTE — Progress Notes (Signed)
   Subjective: 62 year old male with PMHx of DM presenting today for follow up evaluation of an ulceration to the right foot. He reports an increase in pain to the dorsum of the foot. He states he just finished his course of Levaquin. There are no modifying factors noted. Patient is here for further evaluation and treatment.    Past Medical History:  Diagnosis Date  . Back pain   . Diabetes mellitus   . Headache(784.0)    general  . Hyperlipidemia   . Hypertension      Objective/Physical Exam General: The patient is alert and oriented x3 in no acute distress.  Dermatology:  Wound #1 noted to the sub-first metatarsal head of the right foot measuring 1.3 x 1.3 x 0.5 cm (LxWxD).   To the noted ulceration(s), there is no eschar. There is a moderate amount of slough, fibrin, and necrotic tissue noted. Granulation tissue and wound base is red. There is a minimal amount of serosanguineous drainage noted. Wound does probe to bone. There is no malodor. Periwound integrity is intact. Skin is warm, dry and supple bilateral lower extremities.  Vascular: Palpable pedal pulses bilaterally. Capillary refill within normal limits.  Neurological: Epicritic and protective threshold diminished bilaterally.   Musculoskeletal Exam: Range of motion within normal limits to all pedal and ankle joints bilateral. Muscle strength 5/5 in all groups bilateral.   Radiographic Exam:  Normal osseous mineralization. Joint spaces preserved. No fracture/dislocation/boney destruction.    Assessment: #1 Ulceration of the sub-first metatarsal head right foot secondary to diabetes mellitus #2 diabetes mellitus w/ polyneuropathy - uncontrolled  #3 h/o right great toe amputation  #4 Cellulitis right foot and ankle - resolved    Plan of Care:  #1 Patient was evaluated. X-Rays reviewed.  #2 Medically necessary excisional debridement including muscle and deep fascial tissue was performed using a tissue nipper and a  chisel blade. Excisional debridement of all the necrotic nonviable tissue down to healthy bleeding viable tissue was performed with post-debridement measurements same as pre-. #3 The wound was cleansed and collagen and dry sterile dressing applied. #4 Patient finished Levaquin 750 mg approximately one week ago.  #5 Recommended sesamoidectomy surgery to alleviate pressure from the ulcer.  #6 Offloading dancer's pads dispensed.  #7 Return to clinic in 3 weeks for surgical consult. Patient needs to make arrangements with work.   Works Architect.    Edrick Kins, DPM [facility]  Dr. Edrick Kins, [place]                                        [place], [place] [address]                Office [phone]  Fax [phone]

## 2018-11-21 DIAGNOSIS — M869 Osteomyelitis, unspecified: Secondary | ICD-10-CM

## 2018-11-21 HISTORY — DX: Osteomyelitis, unspecified: M86.9

## 2018-11-26 ENCOUNTER — Encounter: Payer: Self-pay | Admitting: Podiatry

## 2018-11-26 ENCOUNTER — Ambulatory Visit (INDEPENDENT_AMBULATORY_CARE_PROVIDER_SITE_OTHER): Payer: BLUE CROSS/BLUE SHIELD | Admitting: Podiatry

## 2018-11-26 DIAGNOSIS — E0843 Diabetes mellitus due to underlying condition with diabetic autonomic (poly)neuropathy: Secondary | ICD-10-CM | POA: Diagnosis not present

## 2018-11-26 DIAGNOSIS — L03115 Cellulitis of right lower limb: Secondary | ICD-10-CM

## 2018-11-26 DIAGNOSIS — L97512 Non-pressure chronic ulcer of other part of right foot with fat layer exposed: Secondary | ICD-10-CM | POA: Diagnosis not present

## 2018-11-26 DIAGNOSIS — I70235 Atherosclerosis of native arteries of right leg with ulceration of other part of foot: Secondary | ICD-10-CM

## 2018-11-26 MED ORDER — LEVOFLOXACIN 750 MG PO TABS: 750.0000 mg | ORAL_TABLET | Freq: Every day | ORAL | 1 refills | Status: DC

## 2018-11-26 MED ORDER — OXYCODONE-ACETAMINOPHEN 5-325 MG PO TABS: 1.0000 | ORAL_TABLET | Freq: Four times a day (QID) | ORAL | 0 refills | Status: DC | PRN

## 2018-11-26 NOTE — Progress Notes (Signed)
Subjective: 63 year old male with PMHx of DM presenting today for follow up evaluation of an ulceration to the right foot.  Patient has an increase in pain associated to the foot and he also says that he notices swelling and also says he has been feeling feverish off and on the past week.  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 Back pain   . Diabetes mellitus   . Headache(784.0)    general  . Hyperlipidemia   . Hypertension      Objective/Physical Exam General: The patient is alert and oriented x3 in no acute distress.  Dermatology:  Wound #1 noted to the sub-first metatarsal head of the right foot measuring 1.5 x 1.5 x 0.7 cm (LxWxD).   To the noted ulceration(s), there is no eschar. There is a moderate amount of slough, fibrin, and necrotic tissue noted. Granulation tissue and wound base is red. There is a moderate amount of serosanguineous drainage noted. Wound does probe to bone. There is no malodor. Periwound integrity is intact and callused. Skin is warm, dry and supple bilateral lower extremities.  Vascular: Palpable pedal pulses bilaterally. Capillary refill within normal limits.  Neurological: Epicritic and protective threshold diminished bilaterally.   Musculoskeletal Exam: Range of motion within normal limits to all pedal and ankle joints bilateral. Muscle strength 5/5 in all groups bilateral.  History of right great toe amputation.  Assessment: #1 Ulceration of the sub-first metatarsal head right foot secondary to diabetes mellitus #2 diabetes mellitus w/ polyneuropathy - uncontrolled  #3 h/o right great toe amputation  #4 Cellulitis right foot and ankle -recurrent   Plan of Care:  #1 Patient was evaluated. #2 Medically necessary excisional debridement including muscle and deep fascial tissue was performed using a tissue nipper and a chisel blade. Excisional debridement of all the  necrotic nonviable tissue down to healthy bleeding viable tissue was performed with post-debridement measurements same as pre-. #3 The wound was cleansed and collagen and dry sterile dressing applied.  Continue dry sterile dressing daily #4  New prescription for Levaquin 750 mg daily #5 today explained the patient that he will likely need surgical debridement with sesamoidectomy of the underlying sesamoids that are contributing to the ulceration.  Patient agrees and understands.  All pros and cons and details the procedure have been explained.  All patient questions were answered.  No guarantees were expressed or implied. #6 authorization for surgery was initiated today.  Surgery will consist of sesamoidectomy right foot.  Excisional debridement of ulceration right foot with possible application of Acell wound product.  #7 prescription for Vicodin 5/325 mg #8 continue weightbearing in the immobilization cam boot. #9 since the patient does have sensation of feeling feverish I do recommend that the patient goes to the emergency department.  If the patient does go the emergency department and is admitted we will go ahead and just perform the surgery inpatient.  Otherwise we will control the cellulitis with oral Levaquin and proceed with outpatient surgery. #10 return to clinic in 2 weeks  Works Architect.    Edrick Kins, DPM [facility]  Dr. Edrick Kins, DPM    [place]  [place], [place] [address]                Office [phone]  Fax [phone]

## 2018-11-26 NOTE — Patient Instructions (Signed)
Pre-Operative Instructions  Congratulations, you have decided to take an important step towards improving your quality of life.  You can be assured that the doctors and staff at [facility] will be with you every step of the way.  Here are some important things you should know:  1. Plan to be at the surgery center/hospital at least 1 (one) hour prior to your scheduled time, unless otherwise directed by the surgical center/hospital staff.  You must have a responsible adult accompany you, remain during the surgery and drive you home.  Make sure you have directions to the surgical center/hospital to ensure you arrive on time. 2. If you are having surgery at [facility] or [facility] hospitals, you will need a copy of your medical history and physical form from your family physician within one month prior to the date of surgery. We will give you a form for your primary physician to complete.  3. We make every effort to accommodate the date you request for surgery.  However, there are times where surgery dates or times have to be moved.  We will contact you as soon as possible if a change in schedule is required.   4. No aspirin/ibuprofen for one week before surgery.  If you are on aspirin, any non-steroidal anti-inflammatory medications (Mobic, Aleve, Ibuprofen) should not be taken seven (7) days prior to your surgery.  You make take Tylenol for pain prior to surgery.  5. Medications - If you are taking daily heart and blood pressure medications, seizure, reflux, allergy, asthma, anxiety, pain or diabetes medications, make sure you notify the surgery center/hospital before the day of surgery so they can tell you which medications you should take or avoid the day of surgery. 6. No food or drink after midnight the night before surgery unless directed otherwise by surgical center/hospital staff. 7. No alcoholic beverages 24-hours prior to surgery.  No smoking 24-hours prior or 24-hours after  surgery. 8. Wear loose pants or shorts. They should be loose enough to fit over bandages, boots, and casts. 9. Don't wear slip-on shoes. Sneakers are preferred. 10. Bring your boot with you to the surgery center/hospital.  Also bring crutches or a walker if your physician has prescribed it for you.  If you do not have this equipment, it will be provided for you after surgery. 11. If you have not been contacted by the surgery center/hospital by the day before your surgery, call to confirm the date and time of your surgery. 12. Leave-time from work may vary depending on the type of surgery you have.  Appropriate arrangements should be made prior to surgery with your employer. 13. Prescriptions will be provided immediately following surgery by your doctor.  Fill these as soon as possible after surgery and take the medication as directed. Pain medications will not be refilled on weekends and must be approved by the doctor. 14. Remove nail polish on the operative foot and avoid getting pedicures prior to surgery. 15. Wash the night before surgery.  The night before surgery wash the foot and leg well with water and the antibacterial soap provided. Be sure to pay special attention to beneath the toenails and in between the toes.  Wash for at least three (3) minutes. Rinse thoroughly with water and dry well with a towel.  Perform this wash unless told not to do so by your physician.  Enclosed: 1 Ice pack (please put in freezer the night before surgery)   1 Hibiclens skin cleaner     Pre-op instructions  If you have any questions regarding the instructions, please do not hesitate to call our office.  [place]: [address], [place], [place] [address] -- [phone]  [place]: [address] [place], [place] [address] -- [phone]  [place]: [address]  [place], [place] [address] -- [phone]  [place]: [address], [address], [place], [place] [address] -- [phone]  Website:  [email or web address] 

## 2018-11-28 ENCOUNTER — Emergency Department (HOSPITAL_BASED_OUTPATIENT_CLINIC_OR_DEPARTMENT_OTHER)
Admission: EM | Admit: 2018-11-28 | Discharge: 2018-11-28 | Disposition: A | Discharge status: Home / Self Care | Payer: BLUE CROSS/BLUE SHIELD | Attending: Emergency Medicine | Admitting: Emergency Medicine

## 2018-11-28 ENCOUNTER — Encounter (HOSPITAL_BASED_OUTPATIENT_CLINIC_OR_DEPARTMENT_OTHER): Payer: Self-pay

## 2018-11-28 ENCOUNTER — Other Ambulatory Visit: Payer: Self-pay

## 2018-11-28 DIAGNOSIS — F17210 Nicotine dependence, cigarettes, uncomplicated: Secondary | ICD-10-CM | POA: Insufficient documentation

## 2018-11-28 DIAGNOSIS — L97415 Non-pressure chronic ulcer of right heel and midfoot with muscle involvement without evidence of necrosis: Secondary | ICD-10-CM | POA: Diagnosis not present

## 2018-11-28 DIAGNOSIS — Z79899 Other long term (current) drug therapy: Secondary | ICD-10-CM | POA: Diagnosis not present

## 2018-11-28 DIAGNOSIS — I10 Essential (primary) hypertension: Secondary | ICD-10-CM | POA: Insufficient documentation

## 2018-11-28 DIAGNOSIS — E11621 Type 2 diabetes mellitus with foot ulcer: Secondary | ICD-10-CM | POA: Diagnosis present

## 2018-11-28 DIAGNOSIS — E785 Hyperlipidemia, unspecified: Secondary | ICD-10-CM | POA: Diagnosis not present

## 2018-11-28 NOTE — ED Triage Notes (Signed)
C/o "diabetic ulcer" to right foot x 3 months-was advised by foot care MD to come to have IV abx-NAD-steady gait

## 2018-11-28 NOTE — ED Provider Notes (Signed)
[facility] EMERGENCY DEPARTMENT Provider Note   CSN: [id number] Arrival date & time: 11/28/18  2044     History   Chief Complaint Chief Complaint  Patient presents with  . Skin Ulcer    HPI Fernando Anthony is a 63 y.o. male.  Has past medical history of type 2 diabetes, hypertension, hyperlipidemia, diabetic neuropathy.  Patient reporting to the ED today after going to see his foot doctor who is taking care of his right diabetic foot ulcer, Dr. Amalia Hailey.  He reports that he had an appointment with Dr. Amalia Hailey on 11/26/2018 at which point he was feeling slightly feverish and was advised to go to the emergency room.  Patient states that he did not like the emergency department at that time because he had things to wrap up at his house.  Patient states that he was told that if he went to the emergency room he might be able to be admitted to the hospital and have his outpatient surgery done inpatient.  Dr. Amalia Hailey then gave patient Levaquin.  Patient reports that he has been taking the Levaquin and since starting the antibiotic his foot has improved.  He states he no longer feels feverish.  States that he is overall feeling much better.  He also states that his foot is no longer hurting as much as it was.  Patient reports that he believes that his surgery is scheduled for January 20 but he is not 100% sure.  He does state that the surgery was planned to be an outpatient procedure unless he went to the emergency room.  The history is provided by the spouse and the patient.    Past Medical History:  Diagnosis Date  . Back pain   . Diabetes mellitus   . Headache(784.0)    general  . Hyperlipidemia   . Hypertension     Patient Active Problem List   Diagnosis Date Noted  . Hypertension, essential, benign 08/20/2017  . Hypokalemia 08/20/2017  . Lumbar post-laminectomy syndrome 09/16/2014  . Diabetic neuropathy ([place]) 04/04/2013  . Postlaminectomy syndrome, lumbar region 05/18/2012  .  Diabetes mellitus ([place]) 05/18/2012    Past Surgical History:  Procedure Laterality Date  . AMPUTATION Right 09/18/2013   Procedure: Amputation Right Great Toe at [facility];  Surgeon: Newt Minion, MD;  Location: [facility];  Service: Orthopedics;  Laterality: Right;  Amputation Right Great Toe at [facility]  . CARDIAC CATHETERIZATION  ?1990  . SPINE SURGERY          Home Medications    Prior to Admission medications   Medication Sig Start Date End Date Taking? Authorizing Provider  acetaminophen-codeine (TYLENOL #3) 300-30 MG per tablet Take 1 tablet by mouth every 8 (eight) hours as needed for moderate pain. One month supply 11/03/14   Kirsteins, Luanna Salk, MD  amitriptyline (ELAVIL) 25 MG tablet Take 25 mg by mouth at bedtime.    [provider]  aspirin 81 MG tablet Take 81 mg by mouth daily.    [provider]  atenolol (TENORMIN) 25 MG tablet Take by mouth. 02/09/18   [provider]  cephALEXin (KEFLEX) 500 MG capsule Take 1 capsule (500 mg total) by mouth 4 (four) times daily. 09/20/18   Ward, Delice Bison, DO  cyclobenzaprine (FLEXERIL) 10 MG tablet Take 1 tablet (10 mg total) by mouth at bedtime. 09/16/14   Kirsteins, Luanna Salk, MD  gabapentin (NEURONTIN) 100 MG capsule Take 1 capsule (100 mg total) by mouth 3 (three)  times daily. Patient taking differently: Take 300 mg by mouth 3 (three) times daily.  09/16/14   Kirsteins, Luanna Salk, MD  hydrochlorothiazide (MICROZIDE) 12.5 MG capsule Take by mouth. 08/23/18   [provider]  insulin glargine (LANTUS) 100 UNIT/ML injection Inject 0.35 mLs (35 Units total) into the skin 2 (two) times daily. 06/17/17   Shary Decamp, PA-C  levofloxacin (LEVAQUIN) 750 MG tablet Take 1 tablet (750 mg total) by mouth daily. 11/26/18   Edrick Kins, DPM  lisinopril (PRINIVIL,ZESTRIL) 20 MG tablet Take 20 mg by mouth daily.    [provider]  metFORMIN (GLUCOPHAGE) 500 MG tablet Take 1 tablet (500 mg total) by mouth 2 (two) times  daily with a meal. Patient taking differently: Take 1,000 mg by mouth daily with breakfast.  06/17/17   Shary Decamp, PA-C  METHADONE HCL PO Take by mouth.    [provider]  Multiple Vitamin (MULTIVITAMIN WITH MINERALS) TABS tablet Take 1 tablet by mouth daily.    [provider]  Omega-3 Fatty Acids (FISH OIL) 1200 MG CAPS Take 1,200 mg by mouth daily.    [provider]  oxyCODONE-acetaminophen (PERCOCET) 5-325 MG tablet Take 1 tablet by mouth every 6 (six) hours as needed for severe pain. 11/26/18   Edrick Kins, DPM  rosuvastatin (CRESTOR) 10 MG tablet Take 10 mg by mouth daily.    [provider]  traMADol Veatrice Bourbon) 50 MG tablet Take 2 Tablets every 6 hours as needed for pain 07/25/14   Bayard Hugger, NP    Family History Family History  Problem Relation Age of Onset  . Cancer Mother   . Heart disease Father     Social History Social History   Tobacco Use  . Smoking status: Current Every Day Smoker    Types: Cigarettes  . Smokeless tobacco: Never Used  Substance Use Topics  . Alcohol use: Not Currently  . Drug use: Not Currently    Comment: past use of marijuana     Allergies   Vicodin [hydrocodone-acetaminophen]   Review of Systems Review of Systems  Constitutional: Negative for chills and fever.  Respiratory: Negative for shortness of breath.   Cardiovascular: Negative for chest pain and leg swelling.  Gastrointestinal: Negative for diarrhea, nausea and vomiting.  Musculoskeletal: Negative for gait problem.  Neurological: Negative for light-headedness.  All other systems reviewed and are negative.    Physical Exam Updated Vital Signs BP (!) 164/88 (BP Location: Left Arm)   Pulse 84   Temp 97.9 F (36.6 C) (Oral)   Resp 18   Ht 6\' 1"  (1.854 m)   Wt 77.6 kg   SpO2 100%   BMI 22.56 kg/m   Physical Exam Vitals signs and nursing note reviewed.  Constitutional:      Appearance: Normal appearance. He is normal weight.    HENT:     Head: Normocephalic and atraumatic.     Nose: Nose normal.     Mouth/Throat:     Mouth: Mucous membranes are moist.  Pulmonary:     Effort: Pulmonary effort is normal.  Musculoskeletal:     Comments: See picture below of R foot ulcer  Skin:    General: Skin is warm.  Neurological:     General: No focal deficit present.     Mental Status: He is alert. Mental status is at baseline.  Psychiatric:        Mood and Affect: Mood normal.  Behavior: Behavior normal.        Thought Content: Thought content normal.        Judgment: Judgment normal.        ED Treatments / Results  Labs (all labs ordered are listed, but only abnormal results are displayed) Labs Reviewed - No data to display  EKG None  Radiology No results found.  Procedures Procedures (including critical care time)  Medications Ordered in ED Medications - No data to display   Initial Impression / Assessment and Plan / ED Course  I have reviewed the triage vital signs and the nursing notes.  Pertinent labs & imaging results that were available during my care of the patient were reviewed by me and considered in my medical decision making (see chart for details).    Patient is 63 year old male with past medical history of type 2 diabetes who is here with right diabetic foot ulcer.  [facility].  Patient was to be scheduled for outpatient surgery for surgical debridement with sesamoidectomy.  He did not go to the emergency department as recommended by podiatrist when he was feeling feverish.  Patient was started on outpatient Levaquin.  Reports that he is no longer feeling ill and is feeling much better after starting that medication.  Given that it is unclear by podiatrist note and with patient story paged on-call physician with [facility] in order to determine if patient needed to be admitted for surgery. I spoke with on-call [facility]iatrist [facility] who recommended that if patient is well-appearing and does not meet criteria for admission that he is to continue taking his Levaquin and follow-up for outpatient surgery.  Patient is to call the office if there is any confusion regarding the day of the surgery.  Patient given strict return precautions including development of fevers, redness, swelling, or increased pain of ulceration on foot.  Patient to continue taking his Levaquin and to follow-up with his podiatrist for scheduled surgery.  [place] Dayron Odland, DO PGY-2, [facility]   Final Clinical Impressions(s) / ED Diagnoses   Final diagnoses:  Diabetic ulcer of right midfoot associated with type 2 diabetes mellitus, with muscle involvement without evidence of necrosis [facility])    ED Discharge Orders    None       Eliyana Pagliaro, [place], DO 11/28/18 2207    Quintella Reichert, MD 11/29/18 [phone]

## 2018-11-28 NOTE — Discharge Instructions (Addendum)
Please continue taking your Levaquin as prescribed by your podiatrist.  It is important that you continue follow-up with [facility].  Please call their office if you need clarification regarding the date of your outpatient surgery.  If you develop any fevers, redness, swelling or worsening pain of your foot then please do not hesitate to come back to the emergency room.

## 2018-12-03 ENCOUNTER — Ambulatory Visit (INDEPENDENT_AMBULATORY_CARE_PROVIDER_SITE_OTHER): Payer: BLUE CROSS/BLUE SHIELD | Admitting: Podiatry

## 2018-12-03 ENCOUNTER — Telehealth: Payer: Self-pay | Admitting: Podiatry

## 2018-12-03 ENCOUNTER — Encounter (HOSPITAL_COMMUNITY): Payer: Self-pay

## 2018-12-03 ENCOUNTER — Ambulatory Visit (INDEPENDENT_AMBULATORY_CARE_PROVIDER_SITE_OTHER): Payer: BLUE CROSS/BLUE SHIELD

## 2018-12-03 ENCOUNTER — Inpatient Hospital Stay (HOSPITAL_COMMUNITY)
Admission: EM | Admit: 2018-12-03 | Discharge: 2018-12-07 | DRG: 617 | Disposition: A | Discharge status: Home / Self Care | Payer: BLUE CROSS/BLUE SHIELD | Attending: Internal Medicine | Admitting: Internal Medicine

## 2018-12-03 DIAGNOSIS — Z7982 Long term (current) use of aspirin: Secondary | ICD-10-CM

## 2018-12-03 DIAGNOSIS — Z89411 Acquired absence of right great toe: Secondary | ICD-10-CM

## 2018-12-03 DIAGNOSIS — Z809 Family history of malignant neoplasm, unspecified: Secondary | ICD-10-CM

## 2018-12-03 DIAGNOSIS — M86171 Other acute osteomyelitis, right ankle and foot: Secondary | ICD-10-CM | POA: Diagnosis present

## 2018-12-03 DIAGNOSIS — F17210 Nicotine dependence, cigarettes, uncomplicated: Secondary | ICD-10-CM | POA: Diagnosis present

## 2018-12-03 DIAGNOSIS — I10 Essential (primary) hypertension: Secondary | ICD-10-CM | POA: Diagnosis present

## 2018-12-03 DIAGNOSIS — L97512 Non-pressure chronic ulcer of other part of right foot with fat layer exposed: Secondary | ICD-10-CM

## 2018-12-03 DIAGNOSIS — Z885 Allergy status to narcotic agent status: Secondary | ICD-10-CM

## 2018-12-03 DIAGNOSIS — E1165 Type 2 diabetes mellitus with hyperglycemia: Secondary | ICD-10-CM

## 2018-12-03 DIAGNOSIS — I160 Hypertensive urgency: Secondary | ICD-10-CM | POA: Diagnosis present

## 2018-12-03 DIAGNOSIS — L97419 Non-pressure chronic ulcer of right heel and midfoot with unspecified severity: Secondary | ICD-10-CM | POA: Diagnosis present

## 2018-12-03 DIAGNOSIS — R945 Abnormal results of liver function studies: Secondary | ICD-10-CM | POA: Diagnosis present

## 2018-12-03 DIAGNOSIS — R402142 Coma scale, eyes open, spontaneous, at arrival to emergency department: Secondary | ICD-10-CM | POA: Diagnosis present

## 2018-12-03 DIAGNOSIS — R7989 Other specified abnormal findings of blood chemistry: Secondary | ICD-10-CM

## 2018-12-03 DIAGNOSIS — IMO0002 Reserved for concepts with insufficient information to code with codable children: Secondary | ICD-10-CM | POA: Diagnosis present

## 2018-12-03 DIAGNOSIS — E871 Hypo-osmolality and hyponatremia: Secondary | ICD-10-CM | POA: Diagnosis present

## 2018-12-03 DIAGNOSIS — L97519 Non-pressure chronic ulcer of other part of right foot with unspecified severity: Secondary | ICD-10-CM | POA: Diagnosis present

## 2018-12-03 DIAGNOSIS — Z8249 Family history of ischemic heart disease and other diseases of the circulatory system: Secondary | ICD-10-CM

## 2018-12-03 DIAGNOSIS — E118 Type 2 diabetes mellitus with unspecified complications: Secondary | ICD-10-CM | POA: Diagnosis not present

## 2018-12-03 DIAGNOSIS — Z9889 Other specified postprocedural states: Secondary | ICD-10-CM

## 2018-12-03 DIAGNOSIS — E1121 Type 2 diabetes mellitus with diabetic nephropathy: Secondary | ICD-10-CM | POA: Diagnosis present

## 2018-12-03 DIAGNOSIS — Z9119 Patient's noncompliance with other medical treatment and regimen: Secondary | ICD-10-CM

## 2018-12-03 DIAGNOSIS — I70235 Atherosclerosis of native arteries of right leg with ulceration of other part of foot: Secondary | ICD-10-CM

## 2018-12-03 DIAGNOSIS — M869 Osteomyelitis, unspecified: Secondary | ICD-10-CM

## 2018-12-03 DIAGNOSIS — E0843 Diabetes mellitus due to underlying condition with diabetic autonomic (poly)neuropathy: Secondary | ICD-10-CM | POA: Diagnosis not present

## 2018-12-03 DIAGNOSIS — M86071 Acute hematogenous osteomyelitis, right ankle and foot: Secondary | ICD-10-CM

## 2018-12-03 DIAGNOSIS — E1169 Type 2 diabetes mellitus with other specified complication: Secondary | ICD-10-CM | POA: Diagnosis not present

## 2018-12-03 DIAGNOSIS — L03115 Cellulitis of right lower limb: Secondary | ICD-10-CM

## 2018-12-03 DIAGNOSIS — R402252 Coma scale, best verbal response, oriented, at arrival to emergency department: Secondary | ICD-10-CM | POA: Diagnosis present

## 2018-12-03 DIAGNOSIS — Z794 Long term (current) use of insulin: Secondary | ICD-10-CM

## 2018-12-03 DIAGNOSIS — E785 Hyperlipidemia, unspecified: Secondary | ICD-10-CM | POA: Diagnosis present

## 2018-12-03 DIAGNOSIS — E11621 Type 2 diabetes mellitus with foot ulcer: Secondary | ICD-10-CM | POA: Diagnosis present

## 2018-12-03 DIAGNOSIS — R402362 Coma scale, best motor response, obeys commands, at arrival to emergency department: Secondary | ICD-10-CM | POA: Diagnosis present

## 2018-12-03 HISTORY — DX: Osteomyelitis, unspecified: M86.9

## 2018-12-03 LAB — COMPREHENSIVE METABOLIC PANEL
ALBUMIN: 2 g/dL — AB (ref 3.5–5.0)
ALK PHOS: 364 U/L — AB (ref 38–126)
ALT: 245 U/L — ABNORMAL HIGH (ref 0–44)
AST: 192 U/L — AB (ref 15–41)
Anion gap: 8 (ref 5–15)
BUN: 15 mg/dL (ref 8–23)
CHLORIDE: 96 mmol/L — AB (ref 98–111)
CO2: 25 mmol/L (ref 22–32)
Calcium: 8.1 mg/dL — ABNORMAL LOW (ref 8.9–10.3)
Creatinine, Ser: 0.81 mg/dL (ref 0.61–1.24)
GFR calc Af Amer: >60 mL/min (ref >60)
GFR calc non Af Amer: >60 mL/min (ref >60)
GLUCOSE: 261 mg/dL — AB (ref 70–99)
Potassium: 4.2 mmol/L (ref 3.5–5.1)
Sodium: 129 mmol/L — ABNORMAL LOW (ref 135–145)
Total Bilirubin: 0.4 mg/dL (ref 0.3–1.2)
Total Protein: 6.5 g/dL (ref 6.5–8.1)

## 2018-12-03 LAB — CBC WITH DIFFERENTIAL/PLATELET
Abs Immature Granulocytes: 0.35 10*3/uL — ABNORMAL HIGH (ref 0.00–0.07)
BASOS ABS: 0 10*3/uL (ref 0.0–0.1)
Basophils Relative: 0 %
Eosinophils Absolute: 0.1 10*3/uL (ref 0.0–0.5)
Eosinophils Relative: 1 %
HEMATOCRIT: 36.6 % — AB (ref 39.0–52.0)
HEMOGLOBIN: 11.6 g/dL — AB (ref 13.0–17.0)
Immature Granulocytes: 3 %
LYMPHS ABS: 1.6 10*3/uL (ref 0.7–4.0)
LYMPHS PCT: 14 %
MCH: 30.1 pg (ref 26.0–34.0)
MCHC: 31.7 g/dL (ref 30.0–36.0)
MCV: 95.1 fL (ref 80.0–100.0)
MONO ABS: 1.3 10*3/uL — AB (ref 0.1–1.0)
MONOS PCT: 12 %
NRBC: 0 % (ref 0.0–0.2)
Neutro Abs: 7.8 10*3/uL — ABNORMAL HIGH (ref 1.7–7.7)
Neutrophils Relative %: 70 %
Platelets: 315 10*3/uL (ref 150–400)
RBC: 3.85 MIL/uL — ABNORMAL LOW (ref 4.22–5.81)
RDW: 11.7 % (ref 11.5–15.5)
WBC: 11.2 10*3/uL — ABNORMAL HIGH (ref 4.0–10.5)

## 2018-12-03 LAB — I-STAT CG4 LACTIC ACID, ED: Lactic Acid, Venous: 1.2 mmol/L (ref 0.5–1.9)

## 2018-12-03 NOTE — Telephone Encounter (Signed)
My husband has a new ulcer with pus coming out of it. Please call me at work, direct number is [phone].

## 2018-12-03 NOTE — ED Triage Notes (Signed)
Pt sent here from [facility] to receive IV abx for diabetic wound to toe on right foot. Pt has hx of amputation of great toe on right foot. Denies fever or chills at home. States his blood sugars have been running in the 200s and he takes insulin at home.

## 2018-12-03 NOTE — Telephone Encounter (Signed)
I called pt's wife, Con Memos, she states she called again and pt has an appt this afternoon. I told Con Memos that was why I was calling to give her husband an appt today.

## 2018-12-03 NOTE — Patient Instructions (Signed)
Today's encounter diagnosis:   1.  Acute cellulitis right foot and ankle 2.  Diabetes mellitus-uncontrolled 3.  Osteomyelitis right first metatarsal 4.  Abscess right foot 5.  Increased pain right foot

## 2018-12-03 NOTE — Progress Notes (Signed)
Subjective: 63 year old male with PMHx of DM presenting today for follow up evaluation of an ulceration to the right foot.  Patient was last seen on 11/26/2018 at which time we did recommend going to the emergency department.  Patient ended up going to the emergency department after a few days however his symptoms improved significantly regarding his ulcer and the cellulitis.  Since the emergency department visit on 11/28/2018 the wound has deteriorated significantly over the past 5 days.  He presents today for a new abscess/ulcer to the dorsal aspect of the amputation stump where the right great toe was.  He also has increased pain.  He currently denies fever and chills.  He has been taking Levaquin 750 mg daily since he was last seen here in the office on 11/26/2018.  He presents today for further treatment and evaluation  Past Medical History:  Diagnosis Date  . Back pain   . Diabetes mellitus   . Headache(784.0)    general  . Hyperlipidemia   . Hypertension      Objective/Physical Exam General: The patient is alert and oriented x3 in no acute distress.  Dermatology:  Wound #1 noted to the sub-first metatarsal head of the right foot.  Wound measures approximately 1.5 cm in diameter with moderate amount of serous drainage and periwound maceration with callus tissue.  Wound base appears a mix of fibrotic and granular tissue.  No strong malodor noted.  Abscess noted to the dorsal aspect of the great toe amputation stump with a new ulcer and drainage.  Ulcer measures approximately 1 cm in diameter and probes approximately 5 cm in depth directly to bone and metatarsal head.  Vascular: Palpable pedal pulses bilaterally.  Significant amount of erythema with edema noted throughout the foot extending up to the level of the ankle.  Neurological: Epicritic and protective threshold diminished bilaterally.   Musculoskeletal Exam: Range of motion within normal limits to all pedal and ankle joints  bilateral. Muscle strength 5/5 in all groups bilateral.  History of right great toe amputation.  There is a significant amount of increased pain with movement of the ankle joint and the foot in general.  Radiographic exam: X-rays taken today are consistent with osteomyelitis of the sesamoidal apparatus as well as the first metatarsal head.  There is degenerative changes and cortical erosion noted based on prior x-rays consistent with acute osteomyelitis  Assessment: #1 Ulceration of the sub-first metatarsal head right foot secondary to diabetes mellitus #2 diabetes mellitus w/ polyneuropathy - uncontrolled  #3 h/o right great toe amputation  #4 Cellulitis right foot and ankle -recurrent #5 acute osteomyelitis right foot   Plan of Care:  #1 Patient was evaluated. #2  New cultures were taken today of the dorsal abscess and sent to pathology for culture and sensitivity #3 due to the acute changes and acute degenerative changes of the foot and ankle I do recommend that the patient goes immediately to the emergency department.  Patient would benefit from IV antibiotics.  If the patient is admitted, please consult podiatry, so we can undergo incision and drainage of the abscess as well as partial excision of the first metatarsal and sesamoidal apparatus. #4 patient states that he will go to the emergency department to be evaluated.  I will continue to check on his status over the next few days. #5 return to clinic post discharge  Edrick Kins, DPM [facility]  Dr. Edrick Kins, DPM    [phone] [address]  [place], [place] [address]                Office [phone]  Fax [phone]

## 2018-12-04 ENCOUNTER — Observation Stay (HOSPITAL_COMMUNITY): Payer: BLUE CROSS/BLUE SHIELD

## 2018-12-04 ENCOUNTER — Encounter (HOSPITAL_COMMUNITY): Payer: Self-pay | Admitting: Family Medicine

## 2018-12-04 ENCOUNTER — Other Ambulatory Visit: Payer: Self-pay

## 2018-12-04 DIAGNOSIS — Z7982 Long term (current) use of aspirin: Secondary | ICD-10-CM | POA: Diagnosis not present

## 2018-12-04 DIAGNOSIS — L97519 Non-pressure chronic ulcer of other part of right foot with unspecified severity: Secondary | ICD-10-CM | POA: Diagnosis present

## 2018-12-04 DIAGNOSIS — E1121 Type 2 diabetes mellitus with diabetic nephropathy: Secondary | ICD-10-CM

## 2018-12-04 DIAGNOSIS — L089 Local infection of the skin and subcutaneous tissue, unspecified: Secondary | ICD-10-CM | POA: Diagnosis not present

## 2018-12-04 DIAGNOSIS — Z89431 Acquired absence of right foot: Secondary | ICD-10-CM | POA: Diagnosis not present

## 2018-12-04 DIAGNOSIS — Z8249 Family history of ischemic heart disease and other diseases of the circulatory system: Secondary | ICD-10-CM | POA: Diagnosis not present

## 2018-12-04 DIAGNOSIS — R402142 Coma scale, eyes open, spontaneous, at arrival to emergency department: Secondary | ICD-10-CM | POA: Diagnosis present

## 2018-12-04 DIAGNOSIS — Z809 Family history of malignant neoplasm, unspecified: Secondary | ICD-10-CM | POA: Diagnosis not present

## 2018-12-04 DIAGNOSIS — L02611 Cutaneous abscess of right foot: Secondary | ICD-10-CM

## 2018-12-04 DIAGNOSIS — E11621 Type 2 diabetes mellitus with foot ulcer: Secondary | ICD-10-CM | POA: Diagnosis present

## 2018-12-04 DIAGNOSIS — R402252 Coma scale, best verbal response, oriented, at arrival to emergency department: Secondary | ICD-10-CM | POA: Diagnosis present

## 2018-12-04 DIAGNOSIS — L03115 Cellulitis of right lower limb: Secondary | ICD-10-CM | POA: Diagnosis present

## 2018-12-04 DIAGNOSIS — R945 Abnormal results of liver function studies: Secondary | ICD-10-CM | POA: Diagnosis not present

## 2018-12-04 DIAGNOSIS — L97419 Non-pressure chronic ulcer of right heel and midfoot with unspecified severity: Secondary | ICD-10-CM | POA: Diagnosis present

## 2018-12-04 DIAGNOSIS — E118 Type 2 diabetes mellitus with unspecified complications: Secondary | ICD-10-CM | POA: Diagnosis present

## 2018-12-04 DIAGNOSIS — I160 Hypertensive urgency: Secondary | ICD-10-CM | POA: Diagnosis present

## 2018-12-04 DIAGNOSIS — F17210 Nicotine dependence, cigarettes, uncomplicated: Secondary | ICD-10-CM | POA: Diagnosis present

## 2018-12-04 DIAGNOSIS — Z9119 Patient's noncompliance with other medical treatment and regimen: Secondary | ICD-10-CM | POA: Diagnosis not present

## 2018-12-04 DIAGNOSIS — E871 Hypo-osmolality and hyponatremia: Secondary | ICD-10-CM | POA: Diagnosis present

## 2018-12-04 DIAGNOSIS — R7989 Other specified abnormal findings of blood chemistry: Secondary | ICD-10-CM | POA: Diagnosis present

## 2018-12-04 DIAGNOSIS — E1165 Type 2 diabetes mellitus with hyperglycemia: Secondary | ICD-10-CM

## 2018-12-04 DIAGNOSIS — M869 Osteomyelitis, unspecified: Secondary | ICD-10-CM | POA: Diagnosis present

## 2018-12-04 DIAGNOSIS — E785 Hyperlipidemia, unspecified: Secondary | ICD-10-CM | POA: Diagnosis present

## 2018-12-04 DIAGNOSIS — M86671 Other chronic osteomyelitis, right ankle and foot: Secondary | ICD-10-CM | POA: Diagnosis not present

## 2018-12-04 DIAGNOSIS — Z794 Long term (current) use of insulin: Secondary | ICD-10-CM | POA: Diagnosis not present

## 2018-12-04 DIAGNOSIS — R402362 Coma scale, best motor response, obeys commands, at arrival to emergency department: Secondary | ICD-10-CM | POA: Diagnosis present

## 2018-12-04 DIAGNOSIS — I10 Essential (primary) hypertension: Secondary | ICD-10-CM | POA: Diagnosis present

## 2018-12-04 DIAGNOSIS — Z89411 Acquired absence of right great toe: Secondary | ICD-10-CM | POA: Diagnosis not present

## 2018-12-04 DIAGNOSIS — Z885 Allergy status to narcotic agent status: Secondary | ICD-10-CM | POA: Diagnosis not present

## 2018-12-04 DIAGNOSIS — M86171 Other acute osteomyelitis, right ankle and foot: Secondary | ICD-10-CM | POA: Diagnosis present

## 2018-12-04 DIAGNOSIS — E1169 Type 2 diabetes mellitus with other specified complication: Secondary | ICD-10-CM | POA: Diagnosis present

## 2018-12-04 LAB — CBC WITH DIFFERENTIAL/PLATELET
Abs Immature Granulocytes: 0.43 10*3/uL — ABNORMAL HIGH (ref 0.00–0.07)
Basophils Absolute: 0 10*3/uL (ref 0.0–0.1)
Basophils Relative: 0 %
EOS ABS: 0.1 10*3/uL (ref 0.0–0.5)
Eosinophils Relative: 1 %
HCT: 38.8 % — ABNORMAL LOW (ref 39.0–52.0)
Hemoglobin: 12.4 g/dL — ABNORMAL LOW (ref 13.0–17.0)
Immature Granulocytes: 4 %
Lymphocytes Relative: 13 %
Lymphs Abs: 1.6 10*3/uL (ref 0.7–4.0)
MCH: 30.2 pg (ref 26.0–34.0)
MCHC: 32 g/dL (ref 30.0–36.0)
MCV: 94.6 fL (ref 80.0–100.0)
Monocytes Absolute: 1.5 10*3/uL — ABNORMAL HIGH (ref 0.1–1.0)
Monocytes Relative: 13 %
Neutro Abs: 8 10*3/uL — ABNORMAL HIGH (ref 1.7–7.7)
Neutrophils Relative %: 69 %
Platelets: 337 10*3/uL (ref 150–400)
RBC: 4.1 MIL/uL — ABNORMAL LOW (ref 4.22–5.81)
RDW: 11.6 % (ref 11.5–15.5)
WBC: 11.6 10*3/uL — ABNORMAL HIGH (ref 4.0–10.5)
nRBC: 0 % (ref 0.0–0.2)

## 2018-12-04 LAB — BASIC METABOLIC PANEL
Anion gap: 5 (ref 5–15)
Anion gap: 7 (ref 5–15)
BUN: 12 mg/dL (ref 8–23)
BUN: 11 mg/dL (ref 8–23)
CALCIUM: 8.4 mg/dL — AB (ref 8.9–10.3)
CALCIUM: 7.9 mg/dL — AB (ref 8.9–10.3)
CO2: 28 mmol/L (ref 22–32)
CO2: 26 mmol/L (ref 22–32)
CREATININE: 0.78 mg/dL (ref 0.61–1.24)
Chloride: 101 mmol/L (ref 98–111)
Chloride: 102 mmol/L (ref 98–111)
Creatinine, Ser: 0.72 mg/dL (ref 0.61–1.24)
GFR calc Af Amer: >60 mL/min (ref >60)
GFR calc Af Amer: >60 mL/min (ref >60)
GFR calc non Af Amer: >60 mL/min (ref >60)
GFR calc non Af Amer: >60 mL/min (ref >60)
Glucose, Bld: 122 mg/dL — ABNORMAL HIGH (ref 70–99)
Glucose, Bld: 319 mg/dL — ABNORMAL HIGH (ref 70–99)
Potassium: 3.7 mmol/L (ref 3.5–5.1)
Potassium: 4.4 mmol/L (ref 3.5–5.1)
Sodium: 134 mmol/L — ABNORMAL LOW (ref 135–145)
Sodium: 135 mmol/L (ref 135–145)

## 2018-12-04 LAB — GLUCOSE, CAPILLARY
Glucose-Capillary: 146 mg/dL — ABNORMAL HIGH (ref 70–99)
Glucose-Capillary: 172 mg/dL — ABNORMAL HIGH (ref 70–99)
Glucose-Capillary: 270 mg/dL — ABNORMAL HIGH (ref 70–99)
Glucose-Capillary: 290 mg/dL — ABNORMAL HIGH (ref 70–99)
Glucose-Capillary: 342 mg/dL — ABNORMAL HIGH (ref 70–99)

## 2018-12-04 LAB — HEPATIC FUNCTION PANEL
ALT: 215 U/L — ABNORMAL HIGH (ref 0–44)
AST: 142 U/L — ABNORMAL HIGH (ref 15–41)
Albumin: 2 g/dL — ABNORMAL LOW (ref 3.5–5.0)
Alkaline Phosphatase: 347 U/L — ABNORMAL HIGH (ref 38–126)
Total Bilirubin: 0.4 mg/dL (ref 0.3–1.2)
Total Protein: 6.1 g/dL — ABNORMAL LOW (ref 6.5–8.1)

## 2018-12-04 LAB — HIV ANTIBODY (ROUTINE TESTING W REFLEX): HIV Screen 4th Generation wRfx: NONREACTIVE

## 2018-12-04 LAB — CBG MONITORING, ED: Glucose-Capillary: 77 mg/dL (ref 70–99)

## 2018-12-04 MED ORDER — LISINOPRIL 20 MG PO TABS
20.0000 mg | ORAL_TABLET | Freq: Every day | ORAL | Status: DC
  Administered 2018-12-04 – 2018-12-07 (×4): 20 mg via ORAL
  Filled 2018-12-04 (×4): qty 1

## 2018-12-04 MED ORDER — SODIUM CHLORIDE 0.9 % IV SOLN
2.0000 g | Freq: Three times a day (TID) | INTRAVENOUS | Status: DC
  Administered 2018-12-04 – 2018-12-06 (×7): 2 g via INTRAVENOUS
  Filled 2018-12-04 (×9): qty 2

## 2018-12-04 MED ORDER — ATENOLOL 25 MG PO TABS
25.0000 mg | ORAL_TABLET | Freq: Every day | ORAL | Status: DC
  Administered 2018-12-04 – 2018-12-06 (×3): 25 mg via ORAL
  Filled 2018-12-04 (×3): qty 1

## 2018-12-04 MED ORDER — NICOTINE 14 MG/24HR TD PT24
14.0000 mg | MEDICATED_PATCH | Freq: Every day | TRANSDERMAL | Status: DC
  Administered 2018-12-04 – 2018-12-06 (×4): 14 mg via TRANSDERMAL
  Filled 2018-12-04 (×4): qty 1

## 2018-12-04 MED ORDER — KETOROLAC TROMETHAMINE 15 MG/ML IJ SOLN
15.0000 mg | Freq: Four times a day (QID) | INTRAMUSCULAR | Status: DC | PRN
  Administered 2018-12-05 – 2018-12-06 (×3): 15 mg via INTRAVENOUS
  Filled 2018-12-04 (×4): qty 1

## 2018-12-04 MED ORDER — VANCOMYCIN HCL 10 G IV SOLR
1500.0000 mg | Freq: Three times a day (TID) | INTRAVENOUS | Status: DC
  Administered 2018-12-04 – 2018-12-05 (×3): 1500 mg via INTRAVENOUS
  Filled 2018-12-04 (×4): qty 1500

## 2018-12-04 MED ORDER — OXYCODONE-ACETAMINOPHEN 5-325 MG PO TABS
1.0000 | ORAL_TABLET | Freq: Four times a day (QID) | ORAL | Status: DC | PRN
  Administered 2018-12-04 – 2018-12-05 (×5): 1 via ORAL
  Filled 2018-12-04 (×5): qty 1

## 2018-12-04 MED ORDER — INSULIN GLARGINE 100 UNIT/ML ~~LOC~~ SOLN
25.0000 [IU] | Freq: Two times a day (BID) | SUBCUTANEOUS | Status: DC
  Administered 2018-12-04 – 2018-12-07 (×6): 25 [IU] via SUBCUTANEOUS
  Filled 2018-12-04 (×8): qty 0.25

## 2018-12-04 MED ORDER — ONDANSETRON HCL 4 MG/2ML IJ SOLN: 4.0000 mg | Freq: Four times a day (QID) | INTRAMUSCULAR | Status: DC | PRN

## 2018-12-04 MED ORDER — ACETAMINOPHEN 325 MG PO TABS: 650.0000 mg | ORAL_TABLET | Freq: Four times a day (QID) | ORAL | Status: DC | PRN

## 2018-12-04 MED ORDER — LISINOPRIL 10 MG PO TABS
10.0000 mg | ORAL_TABLET | Freq: Every day | ORAL | Status: DC
  Administered 2018-12-04: 10 mg via ORAL
  Filled 2018-12-04: qty 1

## 2018-12-04 MED ORDER — GABAPENTIN 300 MG PO CAPS
300.0000 mg | ORAL_CAPSULE | Freq: Two times a day (BID) | ORAL | Status: DC
  Administered 2018-12-04 – 2018-12-07 (×7): 300 mg via ORAL
  Filled 2018-12-04 (×7): qty 1

## 2018-12-04 MED ORDER — METRONIDAZOLE IN NACL 5-0.79 MG/ML-% IV SOLN
500.0000 mg | Freq: Four times a day (QID) | INTRAVENOUS | Status: DC
  Administered 2018-12-04 – 2018-12-06 (×10): 500 mg via INTRAVENOUS
  Filled 2018-12-04 (×10): qty 100

## 2018-12-04 MED ORDER — VANCOMYCIN HCL 10 G IV SOLR
1500.0000 mg | Freq: Once | INTRAVENOUS | Status: AC
  Administered 2018-12-04: 1500 mg via INTRAVENOUS
  Filled 2018-12-04: qty 1500

## 2018-12-04 MED ORDER — ACETAMINOPHEN 650 MG RE SUPP: 650.0000 mg | Freq: Four times a day (QID) | RECTAL | Status: DC | PRN

## 2018-12-04 MED ORDER — INSULIN ASPART 100 UNIT/ML ~~LOC~~ SOLN
0.0000 [IU] | SUBCUTANEOUS | Status: DC
  Administered 2018-12-04: 7 [IU] via SUBCUTANEOUS
  Administered 2018-12-04 (×2): 5 [IU] via SUBCUTANEOUS
  Administered 2018-12-04: 1 [IU] via SUBCUTANEOUS
  Administered 2018-12-04: 2 [IU] via SUBCUTANEOUS
  Administered 2018-12-05: 9 [IU] via SUBCUTANEOUS
  Administered 2018-12-05: 7 [IU] via SUBCUTANEOUS
  Administered 2018-12-05: 5 [IU] via SUBCUTANEOUS
  Administered 2018-12-06: 7 [IU] via SUBCUTANEOUS
  Administered 2018-12-06: 5 [IU] via SUBCUTANEOUS

## 2018-12-04 MED ORDER — ONDANSETRON HCL 4 MG PO TABS: 4.0000 mg | ORAL_TABLET | Freq: Four times a day (QID) | ORAL | Status: DC | PRN

## 2018-12-04 MED ORDER — SODIUM CHLORIDE 0.9 % IV SOLN
INTRAVENOUS | Status: AC
  Administered 2018-12-04: 04:00:00 via INTRAVENOUS

## 2018-12-04 MED ORDER — BISACODYL 10 MG RE SUPP: 10.0000 mg | Freq: Every day | RECTAL | Status: DC | PRN

## 2018-12-04 MED ORDER — HYDRALAZINE HCL 20 MG/ML IJ SOLN: 10.0000 mg | INTRAMUSCULAR | Status: DC | PRN

## 2018-12-04 MED ORDER — SODIUM CHLORIDE 0.9 % IV SOLN
2.0000 g | Freq: Once | INTRAVENOUS | Status: AC
  Administered 2018-12-04: 2 g via INTRAVENOUS
  Filled 2018-12-04: qty 2

## 2018-12-04 NOTE — ED Provider Notes (Signed)
[facility] EMERGENCY DEPARTMENT Provider Note   CSN: [id number] Arrival date & time: 12/03/18  2132     History   Chief Complaint Chief Complaint  Patient presents with  . Wound Infection    HPI Fernando Anthony is a 63 y.o. male.  Patient sent to the emergency department by his podiatrist, Dr. Daylene Katayama.  Patient has a history of diabetic foot ulcer that has been maximally treated as an outpatient.  Patient sent to the ER today because x-ray was consistent with osteomyelitis.  Patient has been on Levaquin for 1 week.  He has not had any fevers.  Patient reports sudden worsening of redness, swelling and new ulceration of the right foot, at site of previous great toe amputation.     Past Medical History:  Diagnosis Date  . Back pain   . Diabetes mellitus   . Headache(784.0)    general  . Hyperlipidemia   . Hypertension     Patient Active Problem List   Diagnosis Date Noted  . Right foot infection 12/04/2018  . Hypertensive urgency 12/04/2018  . Abnormal LFTs 12/04/2018  . Hyponatremia 12/04/2018  . Hypertension, essential, benign 08/20/2017  . Hypokalemia 08/20/2017  . Lumbar post-laminectomy syndrome 09/16/2014  . Diabetic neuropathy ([place]) 04/04/2013  . Postlaminectomy syndrome, lumbar region 05/18/2012  . 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 AMPUTATION Right 09/18/2013   Procedure: Amputation Right Great Toe at MTP;  Surgeon: Newt Minion, MD;  Location: [facility];  Service: Orthopedics;  Laterality: Right;  Amputation Right Great Toe at MTP  . CARDIAC CATHETERIZATION  ?1990  . SPINE SURGERY          Home Medications    Prior to Admission medications   Medication Sig Start Date End Date Taking? Authorizing Provider  aspirin 81 MG tablet Take 81 mg by mouth at bedtime.    Yes [provider]  atenolol (TENORMIN) 25 MG tablet Take 25 mg by  mouth at bedtime.  02/09/18  Yes [provider]  gabapentin (NEURONTIN) 100 MG capsule Take 1 capsule (100 mg total) by mouth 3 (three) times daily. Patient taking differently: Take 300 mg by mouth 2 (two) times daily.  09/16/14  Yes Kirsteins, Luanna Salk, MD  hydrochlorothiazide (MICROZIDE) 12.5 MG capsule Take 12.5 mg by mouth daily.  08/23/18  Yes [provider]  insulin glargine (LANTUS) 100 UNIT/ML injection Inject 0.35 mLs (35 Units total) into the skin 2 (two) times daily. Patient taking differently: Inject 35-50 Units into the skin 2 (two) times daily. Take 50 units in the morning and 35 units in the evening 06/17/17  Yes Shary Decamp, PA-C  levofloxacin (LEVAQUIN) 750 MG tablet Take 1 tablet (750 mg total) by mouth daily. 11/26/18  Yes Edrick Kins, DPM  lisinopril (PRINIVIL,ZESTRIL) 10 MG tablet Take 10 mg by mouth daily.    Yes [provider]  metFORMIN (GLUCOPHAGE) 500 MG tablet Take 1 tablet (500 mg total) by mouth 2 (two) times daily with a meal. Patient taking differently: Take 1,000 mg by mouth daily with breakfast.  06/17/17  Yes Shary Decamp, PA-C  Omega-3 Fatty Acids (FISH OIL) 500 MG CAPS Take 500 mg by mouth at bedtime.    Yes [provider]  oxyCODONE-acetaminophen (PERCOCET) 5-325 MG tablet Take 1 tablet by mouth every 6 (six) hours as needed for severe pain. 11/26/18  Yes Edrick Kins,  DPM  acetaminophen-codeine (TYLENOL #3) 300-30 MG per tablet Take 1 tablet by mouth every 8 (eight) hours as needed for moderate pain. One month supply Patient not taking: Reported on 12/04/2018 11/03/14   Charlett Blake, MD  cephALEXin (KEFLEX) 500 MG capsule Take 1 capsule (500 mg total) by mouth 4 (four) times daily. Patient not taking: Reported on 12/04/2018 09/20/18   Ward, Delice Bison, DO  cyclobenzaprine (FLEXERIL) 10 MG tablet Take 1 tablet (10 mg total) by mouth at bedtime. Patient not taking: Reported on 12/04/2018 09/16/14   Charlett Blake, MD    traMADol Veatrice Bourbon) 50 MG tablet Take 2 Tablets every 6 hours as needed for pain Patient not taking: Reported on 12/04/2018 07/25/14   Bayard Hugger, NP    Family History Family History  Problem Relation Age of Onset  . Cancer Mother   . Heart disease Father     Social History Social History   Tobacco Use  . Smoking status: Current Every Day Smoker    Types: Cigarettes  . Smokeless tobacco: Never Used  Substance Use Topics  . Alcohol use: Not Currently  . Drug use: Not Currently    Comment: past use of marijuana     Allergies   Vicodin [hydrocodone-acetaminophen]   Review of Systems Review of Systems  Constitutional: Negative for fever.  Skin: Positive for wound.  All other systems reviewed and are negative.    Physical Exam Updated Vital Signs BP (!) 187/105 (BP Location: Left Arm)   Pulse 68   Temp 98.3 F (36.8 C) (Oral)   Resp 19   SpO2 100%   Physical Exam Vitals signs and nursing note reviewed.  Constitutional:      General: He is not in acute distress.    Appearance: Normal appearance. He is well-developed.  HENT:     Head: Normocephalic and atraumatic.     Right Ear: Hearing normal.     Left Ear: Hearing normal.     Nose: Nose normal.  Eyes:     Conjunctiva/sclera: Conjunctivae normal.     Pupils: Pupils are equal, round, and reactive to light.  Neck:     Musculoskeletal: Normal range of motion and neck supple.  Cardiovascular:     Rate and Rhythm: Regular rhythm.     Heart sounds: S1 normal and S2 normal. No murmur. No friction rub. No gallop.   Pulmonary:     Effort: Pulmonary effort is normal. No respiratory distress.     Breath sounds: Normal breath sounds.  Chest:     Chest wall: No tenderness.  Abdominal:     General: Bowel sounds are normal.     Palpations: Abdomen is soft.     Tenderness: There is no abdominal tenderness. There is no guarding or rebound. Negative signs include Murphy's sign and McBurney's sign.     Hernia: No  hernia is present.  Musculoskeletal: Normal range of motion.  Skin:    General: Skin is warm and dry.     Comments: 1 cm ulcerated area plantar aspect at first MTP region of right foot  1.5 cm ulcerated area dorsal aspect over first MTP region  Diffuse swelling and erythema of foot  Neurological:     Mental Status: He is alert and oriented to person, place, and time.     GCS: GCS eye subscore is 4. GCS verbal subscore is 5. GCS motor subscore is 6.     Cranial Nerves: No cranial nerve deficit.  Sensory: No sensory deficit.     Coordination: Coordination normal.  Psychiatric:        Speech: Speech normal.        Behavior: Behavior normal.        Thought Content: Thought content normal.          ED Treatments / Results  Labs (all labs ordered are listed, but only abnormal results are displayed) Labs Reviewed  COMPREHENSIVE METABOLIC PANEL - Abnormal; Notable for the following components:      Result Value   Sodium 129 (*)    Chloride 96 (*)    Glucose, Bld 261 (*)    Calcium 8.1 (*)    Albumin 2.0 (*)    AST 192 (*)    ALT 245 (*)    Alkaline Phosphatase 364 (*)    All other components within normal limits  CBC WITH DIFFERENTIAL/PLATELET - Abnormal; Notable for the following components:   WBC 11.2 (*)    RBC 3.85 (*)    Hemoglobin 11.6 (*)    HCT 36.6 (*)    Neutro Abs 7.8 (*)    Monocytes Absolute 1.3 (*)    Abs Immature Granulocytes 0.35 (*)    All other components within normal limits  AEROBIC CULTURE (SUPERFICIAL SPECIMEN)  I-STAT CG4 LACTIC ACID, ED    EKG None  Radiology Dg Foot Complete Right  Result Date: 12/03/2018 Please see detailed radiograph report in office note.   Procedures Procedures (including critical care time)  Medications Ordered in ED Medications  metroNIDAZOLE (FLAGYL) IVPB 500 mg (has no administration in time range)     Initial Impression / Assessment and Plan / ED Course  I have reviewed the triage vital signs and  the nursing notes.  Pertinent labs & imaging results that were available during my care of the patient were reviewed by me and considered in my medical decision making (see chart for details).     Patient currently under care of Dr. Amalia Hailey, podiatry.  He has been treating maximally as an outpatient with antibiotics and foot is worsening.  X-ray in his office today consistent with osteomyelitis.  Patient will be hospitalized for further management.  Initiate cefepime, Flagyl, vancomycin empirically.  Blood pressure is elevated here in the ER.  Patient did not take his nighttime medications.  He is asymptomatic.  Final Clinical Impressions(s) / ED Diagnoses   Final diagnoses:  Diabetic foot ([place])  Osteomyelitis of right foot, unspecified type [facility])    ED Discharge Orders    None       Orpah Greek, MD 12/04/18 [id number]

## 2018-12-04 NOTE — Progress Notes (Signed)
Pharmacy Antibiotic Note  Fernando Anthony is a 63 y.o. male admitted on 12/03/2018 with foot wound, Xray imaging concerning for osteomyelitis.  Pharmacy has been consulted for vancomycin and cefepime dosing.  Plan: Vancomycin 1500 mg IV Q8H. Goal AUC 400-550. Expected AUC ~500 SCr used 0.8  Cefepime 2g IV Q8H.  Temp (24hrs), Avg:98.3 F (36.8 C), Min:98.3 F (36.8 C), Max:98.3 F (36.8 C)  Recent Labs  Lab 12/03/18 2232 12/03/18 2253  WBC 11.2*  --   CREATININE 0.81  --   LATICACIDVEN  --  1.20    Estimated Creatinine Clearance: 105.1 mL/min (by C-G formula based on SCr of 0.81 mg/dL).    Allergies  Allergen Reactions  . Vicodin [Hydrocodone-Acetaminophen] Itching    Thank you for allowing pharmacy to be a part of this patient's care.  Wynona Neat, PharmD, BCPS  12/04/2018 4:27 AM

## 2018-12-04 NOTE — ED Notes (Signed)
ED TO INPATIENT HANDOFF REPORT  Name/Age/Gender Fernando Anthony 63 y.o. male  Code Status    Code Status Orders  (From admission, onward)         Start     Ordered   12/04/18 0411  Full code  Continuous     12/04/18 0414        Code Status History    This patient has a current code status but no historical code status.      Home/SNF/Other Home  Chief Complaint Right foot infection   Level of Care/Admitting Diagnosis ED Disposition    ED Disposition Condition Comment   Admit  Hospital Area: [facility] [100100]  Level of Care: Med-Surg [16]  I expect the patient will be discharged within 24 hours: No (not a candidate for 5C-Observation unit)  Diagnosis: Right foot infection [026378]  Admitting Physician: Vianne Bulls [[id number]]  Attending Physician: Vianne Bulls [[id number]]  PT Class (Do Not Modify): Observation [104]  PT Acc Code (Do Not Modify): Observation [10022]       Medical History Past Medical History:  Diagnosis Date  . Back pain   . Diabetes mellitus   . Headache(784.0)    general  . Hyperlipidemia   . Hypertension     Allergies Allergies  Allergen Reactions  . Vicodin [Hydrocodone-Acetaminophen] Itching    IV Location/Drains/Wounds Patient Lines/Drains/Airways Status   Active Line/Drains/Airways    Name:   Placement date:   Placement time:   Site:   Days:   Peripheral IV 12/04/18 Right Forearm   12/04/18    0301    Forearm   less than 1   Incision 09/18/13 Foot Right   09/18/13    0853     1903   Wound / Incision (Open or Dehisced) 11/28/18 Diabetic ulcer Foot Right   11/28/18    2118    Foot   6          Labs/Imaging Results for orders placed or performed during the hospital encounter of 12/03/18 (from the past 48 hour(s))  Comprehensive metabolic panel     Status: Abnormal   Collection Time: 12/03/18 10:32 PM  Result Value Ref Range   Sodium 129 (L) 135 - 145 mmol/L   Potassium 4.2 3.5 - 5.1 mmol/L    Chloride 96 (L) 98 - 111 mmol/L   CO2 25 22 - 32 mmol/L   Glucose, Bld 261 (H) 70 - 99 mg/dL   BUN 15 8 - 23 mg/dL   Creatinine, Ser 0.81 0.61 - 1.24 mg/dL   Calcium 8.1 (L) 8.9 - 10.3 mg/dL   Total Protein 6.5 6.5 - 8.1 g/dL   Albumin 2.0 (L) 3.5 - 5.0 g/dL   AST 192 (H) 15 - 41 U/L   ALT 245 (H) 0 - 44 U/L   Alkaline Phosphatase 364 (H) 38 - 126 U/L   Total Bilirubin 0.4 0.3 - 1.2 mg/dL   GFR calc non Af Amer >60 >60 mL/min   GFR calc Af Amer >60 >60 mL/min   Anion gap 8 5 - 15    Comment: Performed at [facility], [address] [place],  [address]  CBC with Differential     Status: Abnormal   Collection Time: 12/03/18 10:32 PM  Result Value Ref Range   WBC 11.2 (H) 4.0 - 10.5 K/uL   RBC 3.85 (L) 4.22 - 5.81 MIL/uL   Hemoglobin 11.6 (L) 13.0 - 17.0 g/dL  HCT 36.6 (L) 39.0 - 52.0 %   MCV 95.1 80.0 - 100.0 fL   MCH 30.1 26.0 - 34.0 pg   MCHC 31.7 30.0 - 36.0 g/dL   RDW 11.7 11.5 - 15.5 %   Platelets 315 150 - 400 K/uL   nRBC 0.0 0.0 - 0.2 %   Neutrophils Relative % 70 %   Neutro Abs 7.8 (H) 1.7 - 7.7 K/uL   Lymphocytes Relative 14 %   Lymphs Abs 1.6 0.7 - 4.0 K/uL   Monocytes Relative 12 %   Monocytes Absolute 1.3 (H) 0.1 - 1.0 K/uL   Eosinophils Relative 1 %   Eosinophils Absolute 0.1 0.0 - 0.5 K/uL   Basophils Relative 0 %   Basophils Absolute 0.0 0.0 - 0.1 K/uL   Immature Granulocytes 3 %   Abs Immature Granulocytes 0.35 (H) 0.00 - 0.07 K/uL    Comment: Performed at [facility] [address], [place], [place] [address]  I-Stat CG4 Lactic Acid, ED     Status: None   Collection Time: 12/03/18 10:53 PM  Result Value Ref Range   Lactic Acid, Venous 1.20 0.5 - 1.9 mmol/L  CBC WITH DIFFERENTIAL     Status: Abnormal   Collection Time: 12/04/18  4:21 AM  Result Value Ref Range   WBC 11.6 (H) 4.0 - 10.5 K/uL   RBC 4.10 (L) 4.22 - 5.81 MIL/uL   Hemoglobin 12.4 (L) 13.0 - 17.0 g/dL   HCT 38.8 (L) 39.0 - 52.0 %   MCV 94.6 80.0 - 100.0 fL   MCH  30.2 26.0 - 34.0 pg   MCHC 32.0 30.0 - 36.0 g/dL   RDW 11.6 11.5 - 15.5 %   Platelets 337 150 - 400 K/uL   nRBC 0.0 0.0 - 0.2 %   Neutrophils Relative % 69 %   Neutro Abs 8.0 (H) 1.7 - 7.7 K/uL   Lymphocytes Relative 13 %   Lymphs Abs 1.6 0.7 - 4.0 K/uL   Monocytes Relative 13 %   Monocytes Absolute 1.5 (H) 0.1 - 1.0 K/uL   Eosinophils Relative 1 %   Eosinophils Absolute 0.1 0.0 - 0.5 K/uL   Basophils Relative 0 %   Basophils Absolute 0.0 0.0 - 0.1 K/uL   Immature Granulocytes 4 %   Abs Immature Granulocytes 0.43 (H) 0.00 - 0.07 K/uL    Comment: Performed at [facility], [address] [place], [place] [address]  Hepatic function panel     Status: Abnormal   Collection Time: 12/04/18  4:21 AM  Result Value Ref Range   Total Protein 6.1 (L) 6.5 - 8.1 g/dL   Albumin 2.0 (L) 3.5 - 5.0 g/dL   AST 142 (H) 15 - 41 U/L   ALT 215 (H) 0 - 44 U/L   Alkaline Phosphatase 347 (H) 38 - 126 U/L   Total Bilirubin 0.4 0.3 - 1.2 mg/dL   Bilirubin, Direct <0.1 0.0 - 0.2 mg/dL   Indirect Bilirubin NOT CALCULATED 0.3 - 0.9 mg/dL    Comment: Performed at [facility] [address], [place], [place] [address]  Basic metabolic panel     Status: Abnormal   Collection Time: 12/04/18  4:21 AM  Result Value Ref Range   Sodium 134 (L) 135 - 145 mmol/L   Potassium 3.7 3.5 - 5.1 mmol/L   Chloride 101 98 - 111 mmol/L   CO2 28 22 - 32 mmol/L   Glucose, Bld 122 (H) 70 -  99 mg/dL   BUN 12 8 - 23 mg/dL   Creatinine, Ser 0.78 0.61 - 1.24 mg/dL   Calcium 8.4 (L) 8.9 - 10.3 mg/dL   GFR calc non Af Amer >60 >60 mL/min   GFR calc Af Amer >60 >60 mL/min   Anion gap 5 5 - 15    Comment: Performed at [facility] [address] [place], [place] [address]  Wound or Superficial Culture     Status: None (Preliminary result)   Collection Time: 12/04/18  4:27 AM  Result Value Ref Range   Specimen Description WOUND RIGHT FOOT    Special Requests NONE    Gram Stain      MODERATE WBC PRESENT,  PREDOMINANTLY PMN NO ORGANISMS SEEN Performed at [facility], [place] [address] [place], [place] [address]    Culture PENDING    Report Status PENDING   CBG monitoring, ED     Status: None   Collection Time: 12/04/18  5:30 AM  Result Value Ref Range   Glucose-Capillary 77 70 - 99 mg/dL   Dg Foot Complete Right  Result Date: 12/03/2018 Please see detailed radiograph report in office note.  US Abdomen Limited Ruq  Result Date: 12/04/2018 CLINICAL DATA:  63 y/o  M; abnormal liver function test. EXAM: ULTRASOUND ABDOMEN LIMITED RIGHT UPPER QUADRANT COMPARISON:  None. FINDINGS: Gallbladder: No gallstones or wall thickening visualized. No sonographic Murphy sign noted by sonographer. Common bile duct: Diameter: 3.7 mm Liver: No focal lesion identified. Within normal limits in parenchymal echogenicity. Portal vein is patent on color Doppler imaging with normal direction of blood flow towards the liver. IMPRESSION: Negative right upper quadrant ultrasound. Electronically Signed   By: Kristine Garbe M.D.   On: 12/04/2018 05:04    Pending Labs Unresulted Labs (From admission, onward)    Start     Ordered   12/04/18 0500  HIV antibody (Routine Testing)  Tomorrow morning,   R     12/04/18 0414   12/04/18 0500  Hepatic function panel  Daily,   R     12/04/18 0414   12/04/18 8144  Basic metabolic panel  Now then every 12 hours,   R     12/04/18 0414          Vitals/Pain Today's Vitals   12/03/18 2216 12/04/18 0200 12/04/18 0315 12/04/18 0345  BP: (!) 188/94 (!) 187/105 (!) 192/95 (!) 193/103  Pulse:  68 98 98  Resp:  19    Temp:      TempSrc:      SpO2:  100% 99% 99%  PainSc:        Isolation Precautions No active isolations  Medications Medications  metroNIDAZOLE (FLAGYL) IVPB 500 mg (0 mg Intravenous Stopped 12/04/18 0518)  vancomycin (VANCOCIN) 1,500 mg in sodium chloride 0.9 % 500 mL IVPB (1,500 mg Intravenous New Bag/Given 12/04/18 0532)   oxyCODONE-acetaminophen (PERCOCET/ROXICET) 5-325 MG per tablet 1 tablet (has no administration in time range)  atenolol (TENORMIN) tablet 25 mg (has no administration in time range)  lisinopril (PRINIVIL,ZESTRIL) tablet 10 mg (has no administration in time range)  gabapentin (NEURONTIN) capsule 300 mg (has no administration in time range)  insulin glargine (LANTUS) injection 25 Units (has no administration in time range)  insulin aspart (novoLOG) injection 0-9 Units (0 Units Subcutaneous Not Given 12/04/18 0533)  0.9 %  sodium chloride infusion ( Intravenous New Bag/Given 12/04/18 0424)  acetaminophen (TYLENOL) tablet 650 mg (has no administration in time range)  Or  acetaminophen (TYLENOL) suppository 650 mg (has no administration in time range)  ketorolac (TORADOL) 15 MG/ML injection 15 mg (has no administration in time range)  bisacodyl (DULCOLAX) suppository 10 mg (has no administration in time range)  ondansetron (ZOFRAN) tablet 4 mg (has no administration in time range)    Or  ondansetron (ZOFRAN) injection 4 mg (has no administration in time range)  hydrALAZINE (APRESOLINE) injection 10 mg (has no administration in time range)  vancomycin (VANCOCIN) 1,500 mg in sodium chloride 0.9 % 500 mL IVPB (has no administration in time range)  ceFEPIme (MAXIPIME) 2 g in sodium chloride 0.9 % 100 mL IVPB (has no administration in time range)  ceFEPIme (MAXIPIME) 2 g in sodium chloride 0.9 % 100 mL IVPB (0 g Intravenous Stopped 12/04/18 0456)    Mobility walks

## 2018-12-04 NOTE — Progress Notes (Signed)
PROGRESS NOTE   Patient seen and examined this morning.  Agree with this morning assessment and plan, please refer to the admission notes for more detail.   Fernando Anthony  FAO:[id number] DOB: 01-26-1956 DOA: 12/03/2018 PCP: Curly Rim, MD   Brief Narrative: 63 year old male with history of uncontrolled diabetes, hypertension, hyperlipidemia, back pain, history of right great toe amputation been dealing with ulcer and wound on his first metatarsal head of right foot.  Seen by podiatry on 1/6 From where he was sent to the ER, was on antibiotics with improvement.  He again had worsening infection with abscess and discharge from the stump, was seen at podiatry clinic by Dr  Amalia Hailey 12/03/2018 and was sent to the hospital for admission.  He has been taking Levaquin 750 mg daily since 1/6.  Patient was placed on vancomycin cefepime and Flagyl and was admitted.  Assessment & Plan:   Ulceration and cellulitis and osteomyelitis of right foot in the setting on DM : x-ray showing osteomyelitis of the sesamoidal apparatus and the first metatarsal head: is afebrile, hemodynamically stable, WBC 11.6 K.  Continue on empiric vancomycin, cefepime and Flagyl.  Culture from the drainage shows WBC.erythema is improving this morning, still with drainage. I called and discussed with Dr. Amalia Hailey from podiatry who will be evaluating the patient this evening and possible plan for debridement tomorrow  Uncontrolled diabetes mellitus with diabetic nephropathy, insulin-dependent.  Managed with metformin and Lantus 50 U every morning and 35 U every afternoon.  Last hemoglobin in September more than 15.  sugar controlled now, continue on this, sliding scale insulin.  Patient reports his blood sugar is anywhere from 150s to high 200s at home.  He is planning to see new endocrinologist soon. Recent Labs  Lab 12/04/18 0530 12/04/18 0812 12/04/18 1217  GLUCAP 77 172* 146*    Hypertensive urgency: From  noncompliance.Patient reports not taking medication for 2 to 3 days.  Blood pressure remains in 696E systolic. Resume his home meds w atenolol, increased lisinopril this am. Continue IV hydralazine as needed.  Optimize his pain. Monitor and adjust BP medication  Abnormal LFTs: lfts trend as below.?  Etiology.  No alcohol abuse history.  Denies abdominal pain or abdominal tenderness. Right upper quadrant ultrasound unremarkable.  We will continue to trend LFTs Recent Labs  Lab 12/03/18 2232 12/04/18 0421  AST 192* 142*  ALT 245* 215*  ALKPHOS 364* 347*  BILITOT 0.4 0.4  PROT 6.5 6.1*  ALBUMIN 2.0* 2.0*    Hyponatremia: Sodium improved to 135.  Continue gentle IV fluids.  DVT prophylaxis:SCD Code Status: FULL Family Communication: Wife at bedside. Disposition Plan:  We will change patient to inpatient status as patient will need at least 2 midnight stay to manage his osteomyelitis/foot infection/ulceration as patient has failed outpatient oral antibiotics on po levaquin.  Consultants: podiatry  Procedures: none  Antimicrobials: Vancomycin 12/04/2018 >> Cefepime  12/04/2018 >> Flagyl  12/04/2018 >>  Subjective:   Objective: Vitals:   12/04/18 0345 12/04/18 0736 12/04/18 0737 12/04/18 0810  BP: (!) 193/103 (!) 170/95  (!) 190/102  Pulse: 98  89 90  Resp:    20  Temp:    98.7 F (37.1 C)  TempSrc:    Oral  SpO2: 99%  98% 100%    Intake/Output Summary (Last 24 hours) at 12/04/2018 1231 Last data filed at 12/04/2018 0518 Gross per 24 hour  Intake 199.6 ml  Output -  Net 199.6 ml   There were  no vitals filed for this visit. Weight change:   There is no height or weight on file to calculate BMI.  Examination:  General exam: Appears calm and comfortable ,Not in distress,average built HEENT:PERRL,Oral mucosa moist, Ear/Nose normal on gross exam Respiratory system: Bilateral equal air entry, normal vesicular breath sounds, no wheezes or crackles  Cardiovascular system: S1  & S2 heard,No JVD, murmurs.No pedal edema. Gastrointestinal system: Abdomen is  soft, non tender, non distended, BS +  Nervous System:Alert and oriented. No focal neurological deficits/moving extremities. Extremities: Improving erythema, crustation/mild drainage from the right foot amputation site as noted below, distal peripheral pulses palpable. Skin: No rashes, lesions,no icterus MSK: Normal muscle bulk,tone ,power     Medications:  Scheduled Meds: . atenolol  25 mg Oral QHS  . gabapentin  300 mg Oral BID  . insulin aspart  0-9 Units Subcutaneous Q4H  . insulin glargine  25 Units Subcutaneous BID  . lisinopril  20 mg Oral Daily   Continuous Infusions: . sodium chloride 100 mL/hr at 12/04/18 0424  . ceFEPime (MAXIPIME) IV    . metronidazole 500 mg (12/04/18 0950)  . vancomycin      Data Reviewed: I have personally reviewed following labs and imaging studies  CBC: Recent Labs  Lab 12/03/18 2232 12/04/18 0421  WBC 11.2* 11.6*  NEUTROABS 7.8* 8.0*  HGB 11.6* 12.4*  HCT 36.6* 38.8*  MCV 95.1 94.6  PLT 315 818   Basic Metabolic Panel: Recent Labs  Lab 12/03/18 2232 12/04/18 0421  NA 129* 134*  K 4.2 3.7  CL 96* 101  CO2 25 28  GLUCOSE 261* 122*  BUN 15 12  CREATININE 0.81 0.78  CALCIUM 8.1* 8.4*   GFR: Estimated Creatinine Clearance: 106.4 mL/min (by C-G formula based on SCr of 0.78 mg/dL). Liver Function Tests: Recent Labs  Lab 12/03/18 2232 12/04/18 0421  AST 192* 142*  ALT 245* 215*  ALKPHOS 364* 347*  BILITOT 0.4 0.4  PROT 6.5 6.1*  ALBUMIN 2.0* 2.0*   No results for input(s): LIPASE, AMYLASE in the last 168 hours. No results for input(s): AMMONIA in the last 168 hours. Coagulation Profile: No results for input(s): INR, PROTIME in the last 168 hours. Cardiac Enzymes: No results for input(s): CKTOTAL, CKMB, CKMBINDEX, TROPONINI in the last 168 hours. BNP (last 3 results) No results for input(s): PROBNP in the last 8760 hours. HbA1C: No  results for input(s): HGBA1C in the last 72 hours. CBG: Recent Labs  Lab 12/04/18 0530 12/04/18 0812 12/04/18 1217  GLUCAP 77 172* 146*   Lipid Profile: No results for input(s): CHOL, HDL, LDLCALC, TRIG, CHOLHDL, LDLDIRECT in the last 72 hours. Thyroid Function Tests: No results for input(s): TSH, T4TOTAL, FREET4, T3FREE, THYROIDAB in the last 72 hours. Anemia Panel: No results for input(s): VITAMINB12, FOLATE, FERRITIN, TIBC, IRON, RETICCTPCT in the last 72 hours. Sepsis Labs: Recent Labs  Lab 12/03/18 2253  LATICACIDVEN 1.20    Recent Results (from the past 240 hour(s))  Wound or Superficial Culture     Status: None (Preliminary result)   Collection Time: 12/04/18  4:27 AM  Result Value Ref Range Status   Specimen Description WOUND RIGHT FOOT  Final   Special Requests NONE  Final   Gram Stain   Final    MODERATE WBC PRESENT, PREDOMINANTLY PMN NO ORGANISMS SEEN Performed at [facility], [address], [place], [place] [address]    Culture PENDING  Incomplete   Report Status PENDING  Incomplete  Radiology Studies: Dg Foot Complete Right  Result Date: 12/03/2018 Please see detailed radiograph report in office note.  US Abdomen Limited Ruq  Result Date: 12/04/2018 CLINICAL DATA:  63 y/o  M; abnormal liver function test. EXAM: ULTRASOUND ABDOMEN LIMITED RIGHT UPPER QUADRANT COMPARISON:  None. FINDINGS: Gallbladder: No gallstones or wall thickening visualized. No sonographic Murphy sign noted by sonographer. Common bile duct: Diameter: 3.7 mm Liver: No focal lesion identified. Within normal limits in parenchymal echogenicity. Portal vein is patent on color Doppler imaging with normal direction of blood flow towards the liver. IMPRESSION: Negative right upper quadrant ultrasound. Electronically Signed   By: Kristine Garbe M.D.   On: 12/04/2018 05:04      LOS: 0 days   Time spent: More than 50% of that time was spent in counseling and/or coordination  of care.  Antonieta Pert, MD [facility]  12/04/2018, 12:31 PM

## 2018-12-04 NOTE — ED Notes (Signed)
Heart healthy carb modified breakfast tray ordered for patient at this time

## 2018-12-04 NOTE — Consult Note (Signed)
   PODIATRY CONSULTATION  HPI: 63 year old male admitted for acute osteomyelitis and cellulitis of the right foot.  Patient was seen in the office yesterday, 12/03/2018, at which time I recommended going to the emergency department for admission and IV antibiotics and emergent surgical intervention.  X-rays taken yesterday were consistent with osteomyelitis to the first metatarsal and sesamoidal apparatus.  He presents at bedside this evening resting comfortably  Past Medical History:  Diagnosis Date  . Back pain   . Diabetes mellitus   . Headache(784.0)    general  . Hyperlipidemia   . Hypertension   . Osteomyelitis ([place]) 11/2018   RIGHT FOOT   CBC    Component Value Date/Time   WBC 11.6 (H) 12/04/2018 0421   RBC 4.10 (L) 12/04/2018 0421   HGB 12.4 (L) 12/04/2018 0421   HCT 38.8 (L) 12/04/2018 0421   PLT 337 12/04/2018 0421   MCV 94.6 12/04/2018 0421   MCH 30.2 12/04/2018 0421   MCHC 32.0 12/04/2018 0421   RDW 11.6 12/04/2018 0421   LYMPHSABS 1.6 12/04/2018 0421   MONOABS 1.5 (H) 12/04/2018 0421   EOSABS 0.1 12/04/2018 0421   BASOSABS 0.0 12/04/2018 0421      Diabetic foot (HCC)  Osteomyelitis of right foot, unspecified type (HCC)  Abnormal LFTs - Plan: US Abdomen Limited RUQ, US Abdomen Limited RUQ    Assessment: 1.  Acute osteomyelitis right foot 2.  Cellulitis right foot 3.  Abscess right foot  Plan of Care:  1. Patient evaluated.  2.  Patient understands that he does need surgical intervention and resection of the infected portion of bone.  I discussed in detail the benefits and disadvantages of the surgery and all risks and complications associated.  Patient agrees to proceed with surgery. 3.  Preoperative orders placed for surgery.  Surgery will consist of partial first ray amputation right.  Incision and drainage right. 4.  Orders placed for n.p.o. after a full breakfast morning of 12/05/2018.  Surgery scheduled for after 5 PM, Wednesday, 12/05/2018 5.   Podiatry will continue to follow and monitor the foot while inpatient      Edrick Kins, DPM [facility]  Dr. Edrick Kins, DPM    [address], [place] [address]                Office [phone]  Fax [phone]

## 2018-12-04 NOTE — H&P (Signed)
History and Physical    ESIAH BAZINET KTG:[id number] DOB: [birth year] DOA: 12/03/2018  PCP: Curly Rim, MD   Patient coming from: Home   Chief Complaint: Right foot ulcer with purulent drainage   HPI: Fernando Anthony is a 63 y.o. male with medical history significant for poorly controlled type 2 diabetes mellitus, hypertension, and right foot infection status post amputation of the first digit, now presenting to the emergency department for evaluation of a new right foot ulcer with purulent drainage.  Patient has been following with podiatry, was noted to have a worsening wound at the right foot stump site, had wound cultures collected in the podiatry clinic and has been on [place] since 11/26/2018.  The wound had improved initially after starting the antibiotic, but has significantly worsened over the past few days.  He has not noted any fevers or chills.  He saw the podiatrist back in the clinic on 12/03/2018, was noted to have significant worsening in the right foot, radiographs were obtained in the clinic and concerning for osteomyelitis, and it was recommended that he go to the emergency department for antibiotics and possible inpatient podiatry consultation.  ED Course: Upon arrival to the ED, patient is found to be afebrile, saturating well on room air and hypertensive to 210/100.  Chemistry panel is notable for glucose of 261, sodium 129, alkaline phosphatase 364, albumin 2.0, AST 192, and ALT 245.  CBC features a mild leukocytosis.  Wound culture was collected in the ED and the patient was treated with vancomycin, cefepime, and Flagyl.  He remains hemodynamically stable and will be observed for further evaluation and management of right foot infection. Patient insistent on eating and was given meal in ED.   Review of Systems:  All other systems reviewed and apart from HPI, are negative.  Past Medical History:  Diagnosis Date  . Back pain   . Diabetes mellitus   . Headache(784.0)    general  . Hyperlipidemia   . Hypertension     Past Surgical History:  Procedure Laterality Date  . AMPUTATION Right 09/18/2013   Procedure: Amputation Right Great Toe at [facility];  Surgeon: Newt Minion, MD;  Location: [facility];  Service: Orthopedics;  Laterality: Right;  Amputation Right Great Toe at [facility]  . CARDIAC CATHETERIZATION  ?1990  . SPINE SURGERY       reports that he has been smoking cigarettes. He has never used smokeless tobacco. He reports previous alcohol use. He reports previous drug use.  Allergies  Allergen Reactions  . Vicodin [Hydrocodone-Acetaminophen] Itching    Family History  Problem Relation Age of Onset  . Cancer Mother   . Heart disease Father      Prior to Admission medications   Medication Sig Start Date End Date Taking? Authorizing Provider  aspirin 81 MG tablet Take 81 mg by mouth at bedtime.    Yes [provider]  atenolol (TENORMIN) 25 MG tablet Take 25 mg by mouth at bedtime.  02/09/18  Yes [provider]  gabapentin (NEURONTIN) 100 MG capsule Take 1 capsule (100 mg total) by mouth 3 (three) times daily. Patient taking differently: Take 300 mg by mouth 2 (two) times daily.  09/16/14  Yes Kirsteins, Luanna Salk, MD  hydrochlorothiazide (MICROZIDE) 12.5 MG capsule Take 12.5 mg by mouth daily.  08/23/18  Yes [provider]  insulin glargine (LANTUS) 100 UNIT/ML injection Inject 0.35 mLs (35 Units total) into the skin 2 (two) times daily. Patient taking differently: Inject  35-50 Units into the skin 2 (two) times daily. Take 50 units in the morning and 35 units in the evening 06/17/17  Yes Shary Decamp, PA-C  levofloxacin (LEVAQUIN) 750 MG tablet Take 1 tablet (750 mg total) by mouth daily. 11/26/18  Yes Edrick Kins, DPM  lisinopril (PRINIVIL,ZESTRIL) 10 MG tablet Take 10 mg by mouth daily.    Yes [provider]  metFORMIN (GLUCOPHAGE) 500 MG tablet Take 1 tablet (500 mg total) by mouth 2 (two) times daily with a  meal. Patient taking differently: Take 1,000 mg by mouth daily with breakfast.  06/17/17  Yes Shary Decamp, PA-C  Omega-3 Fatty Acids (FISH OIL) 500 MG CAPS Take 500 mg by mouth at bedtime.    Yes [provider]  oxyCODONE-acetaminophen (PERCOCET) 5-325 MG tablet Take 1 tablet by mouth every 6 (six) hours as needed for severe pain. 11/26/18  Yes Edrick Kins, DPM  acetaminophen-codeine (TYLENOL #3) 300-30 MG per tablet Take 1 tablet by mouth every 8 (eight) hours as needed for moderate pain. One month supply Patient not taking: Reported on 12/04/2018 11/03/14   Charlett Blake, MD  cyclobenzaprine (FLEXERIL) 10 MG tablet Take 1 tablet (10 mg total) by mouth at bedtime. Patient not taking: Reported on 12/04/2018 09/16/14   Charlett Blake, MD  traMADol Veatrice Bourbon) 50 MG tablet Take 2 Tablets every 6 hours as needed for pain Patient not taking: Reported on 12/04/2018 07/25/14   Bayard Hugger, NP    Physical Exam: Vitals:   12/03/18 2216 12/04/18 0200 12/04/18 0315 12/04/18 0345  BP: (!) 188/94 (!) 187/105 (!) 192/95 (!) 193/103  Pulse:  68 98 98  Resp:  19    Temp:      TempSrc:      SpO2:  100% 99% 99%    Constitutional: NAD, calm Eyes: PERTLA, lids and conjunctivae normal ENMT: Mucous membranes are moist. Posterior pharynx clear of any exudate or lesions.   Neck: normal, supple, no masses, no thyromegaly Respiratory: clear to auscultation bilaterally, no wheezing, no crackles. Normal respiratory effort.   Cardiovascular: S1 & S2 heard, regular rate and rhythm. No extremity edema.   Abdomen: No distension, no tenderness, soft. Bowel sounds active.  Musculoskeletal: no clubbing / cyanosis. Status-post amputation of first digit on right foot.   Skin: Ulceration at plantar aspect of right foot at level or MTP head with minimal drainage, surrounding redness, edema, and tenderness. Warm, dry, well-perfused. Neurologic: no facial asymmetry. Moving all extremities.  Psychiatric:  Alert and oriented x 3. Calm, cooperative.    Labs on Admission: I have personally reviewed following labs and imaging studies  CBC: Recent Labs  Lab 12/03/18 2232  WBC 11.2*  NEUTROABS 7.8*  HGB 11.6*  HCT 36.6*  MCV 95.1  PLT 431   Basic Metabolic Panel: Recent Labs  Lab 12/03/18 2232  NA 129*  K 4.2  CL 96*  CO2 25  GLUCOSE 261*  BUN 15  CREATININE 0.81  CALCIUM 8.1*   GFR: Estimated Creatinine Clearance: 105.1 mL/min (by C-G formula based on SCr of 0.81 mg/dL). Liver Function Tests: Recent Labs  Lab 12/03/18 2232  AST 192*  ALT 245*  ALKPHOS 364*  BILITOT 0.4  PROT 6.5  ALBUMIN 2.0*   No results for input(s): LIPASE, AMYLASE in the last 168 hours. No results for input(s): AMMONIA in the last 168 hours. Coagulation Profile: No results for input(s): INR, PROTIME in the last 168 hours. Cardiac Enzymes: No results for input(s):  CKTOTAL, CKMB, CKMBINDEX, TROPONINI in the last 168 hours. BNP (last 3 results) No results for input(s): PROBNP in the last 8760 hours. HbA1C: No results for input(s): HGBA1C in the last 72 hours. CBG: No results for input(s): GLUCAP in the last 168 hours. Lipid Profile: No results for input(s): CHOL, HDL, LDLCALC, TRIG, CHOLHDL, LDLDIRECT in the last 72 hours. Thyroid Function Tests: No results for input(s): TSH, T4TOTAL, FREET4, T3FREE, THYROIDAB in the last 72 hours. Anemia Panel: No results for input(s): VITAMINB12, FOLATE, FERRITIN, TIBC, IRON, RETICCTPCT in the last 72 hours. Urine analysis: No results found for: COLORURINE, APPEARANCEUR, LABSPEC, PHURINE, GLUCOSEU, HGBUR, BILIRUBINUR, KETONESUR, PROTEINUR, UROBILINOGEN, NITRITE, LEUKOCYTESUR Sepsis Labs: @LABRCNTIP (procalcitonin:4,lacticidven:4) )No results found for this or any previous visit (from the past 240 hour(s)).   Radiological Exams on Admission: Dg Foot Complete Right  Result Date: 12/03/2018 Please see detailed radiograph report in office note.   EKG:  Not performed.   Assessment/Plan   1. Right foot infection  - Presents at direction of podiatrist for worsening right foot ulcer with purulent drainage and radiographs concerning for osteomyelitis despite being on Levaquin since 11/26/18  - He is afebrile with mild leukocytosis and normal lactate, plain films concerning for osteo  - Started on vancomycin, cefepime, and Flagyl in ED  - Continue empiric antibiotics, consult podiatry for I&D and resection of 1st metatarsal and sesamoidal apparatus      2. Hyponatremia  - Serum sodium is 129 on admission in setting of hyperglycemia and HCTZ use  - Appears to be chronic, corrects to 132 when accounting for hyperglycemia  - Stop HCTZ, continue IVF hydration with NS, glycemic-control as below, follow serial chem panels   3. Insulin-dependent DM - A1c was >15.5% in September  - Managed at home with metformin and Lantus 50 qAM and 35 qPM  - Check CBG's, continue Lantus, and use SSI with Novolog while in hospital   4. Hypertension with hypertensive urgency  - BP as high as 209/102 in ED with pain likely contributing  - Continue lisinopril and atenolol, continue pain-control, use hydralazine IVP's as needed    5. Elevated LFT's  - Alkaline phosphatase 364, albumin 2.0, AST 192, and ALT 245 on admission; these were normal in September   - Exam is benign; no N/V/D; does not drink EtOH; no new medications aside from Levaquin   - Check RUQ [place], trend LFT's     DVT prophylaxis: SCD's  Code Status: Full  Family Communication: Discussed with patient  Consults called: None Admission status: Observation     Vianne Bulls, MD [facility] Pager [phone]  If 7PM-7AM, please contact night-coverage [email or web address] Password [email or web address]  12/04/2018, 4:15 AM

## 2018-12-05 ENCOUNTER — Encounter (HOSPITAL_COMMUNITY)
Admission: EM | Disposition: A | Discharge status: Home / Self Care | Payer: Self-pay | Source: Home / Self Care | Attending: Internal Medicine

## 2018-12-05 ENCOUNTER — Encounter (HOSPITAL_COMMUNITY): Payer: Self-pay | Admitting: Orthopedic Surgery

## 2018-12-05 ENCOUNTER — Other Ambulatory Visit: Payer: Self-pay

## 2018-12-05 ENCOUNTER — Inpatient Hospital Stay (HOSPITAL_COMMUNITY): Payer: BLUE CROSS/BLUE SHIELD | Admitting: Anesthesiology

## 2018-12-05 DIAGNOSIS — L02611 Cutaneous abscess of right foot: Secondary | ICD-10-CM

## 2018-12-05 DIAGNOSIS — M86671 Other chronic osteomyelitis, right ankle and foot: Secondary | ICD-10-CM

## 2018-12-05 HISTORY — PX: AMPUTATION: SHX166

## 2018-12-05 LAB — HEPATIC FUNCTION PANEL
ALT: 159 U/L — ABNORMAL HIGH (ref 0–44)
AST: 137 U/L — ABNORMAL HIGH (ref 15–41)
Albumin: 1.5 g/dL — ABNORMAL LOW (ref 3.5–5.0)
Alkaline Phosphatase: 335 U/L — ABNORMAL HIGH (ref 38–126)
Bilirubin, Direct: 0.1 mg/dL (ref 0.0–0.2)
Indirect Bilirubin: 0.2 mg/dL — ABNORMAL LOW (ref 0.3–0.9)
TOTAL PROTEIN: 5.3 g/dL — AB (ref 6.5–8.1)
Total Bilirubin: 0.3 mg/dL (ref 0.3–1.2)

## 2018-12-05 LAB — CBC
HCT: 32.3 % — ABNORMAL LOW (ref 39.0–52.0)
Hemoglobin: 10.5 g/dL — ABNORMAL LOW (ref 13.0–17.0)
MCH: 30.7 pg (ref 26.0–34.0)
MCHC: 32.5 g/dL (ref 30.0–36.0)
MCV: 94.4 fL (ref 80.0–100.0)
Platelets: 317 10*3/uL (ref 150–400)
RBC: 3.42 MIL/uL — ABNORMAL LOW (ref 4.22–5.81)
RDW: 11.9 % (ref 11.5–15.5)
WBC: 10.4 10*3/uL (ref 4.0–10.5)
nRBC: 0 % (ref 0.0–0.2)

## 2018-12-05 LAB — BASIC METABOLIC PANEL
Anion gap: 4 — ABNORMAL LOW (ref 5–15)
BUN: 16 mg/dL (ref 8–23)
CO2: 27 mmol/L (ref 22–32)
Calcium: 7.9 mg/dL — ABNORMAL LOW (ref 8.9–10.3)
Chloride: 104 mmol/L (ref 98–111)
Creatinine, Ser: 0.75 mg/dL (ref 0.61–1.24)
GFR calc Af Amer: >60 mL/min (ref >60)
GFR calc non Af Amer: >60 mL/min (ref >60)
Glucose, Bld: 302 mg/dL — ABNORMAL HIGH (ref 70–99)
POTASSIUM: 4.4 mmol/L (ref 3.5–5.1)
Sodium: 135 mmol/L (ref 135–145)

## 2018-12-05 LAB — SURGICAL PCR SCREEN
MRSA, PCR: NEGATIVE
Staphylococcus aureus: POSITIVE — AB

## 2018-12-05 LAB — GLUCOSE, CAPILLARY
GLUCOSE-CAPILLARY: 101 mg/dL — AB (ref 70–99)
GLUCOSE-CAPILLARY: 78 mg/dL (ref 70–99)
Glucose-Capillary: 254 mg/dL — ABNORMAL HIGH (ref 70–99)
Glucose-Capillary: 335 mg/dL — ABNORMAL HIGH (ref 70–99)
Glucose-Capillary: 395 mg/dL — ABNORMAL HIGH (ref 70–99)
Glucose-Capillary: 158 mg/dL — ABNORMAL HIGH (ref 70–99)
Glucose-Capillary: 75 mg/dL (ref 70–99)
Glucose-Capillary: 98 mg/dL (ref 70–99)

## 2018-12-05 SURGERY — AMPUTATION, FOOT, RAY
Anesthesia: General | Site: Foot | Laterality: Right

## 2018-12-05 MED ORDER — ROCURONIUM BROMIDE 50 MG/5ML IV SOSY
PREFILLED_SYRINGE | INTRAVENOUS | Status: AC
  Filled 2018-12-05: qty 10

## 2018-12-05 MED ORDER — SENNOSIDES-DOCUSATE SODIUM 8.6-50 MG PO TABS
1.0000 | ORAL_TABLET | Freq: Two times a day (BID) | ORAL | Status: DC
  Administered 2018-12-05 – 2018-12-07 (×5): 1 via ORAL
  Filled 2018-12-05 (×5): qty 1

## 2018-12-05 MED ORDER — LIDOCAINE HCL (PF) 1 % IJ SOLN
INTRAMUSCULAR | Status: AC
  Filled 2018-12-05: qty 30

## 2018-12-05 MED ORDER — BUPIVACAINE HCL (PF) 0.5 % IJ SOLN
INTRAMUSCULAR | Status: DC | PRN
  Administered 2018-12-05: 30 mL via INTRA_ARTICULAR

## 2018-12-05 MED ORDER — MIDAZOLAM HCL 2 MG/2ML IJ SOLN
INTRAMUSCULAR | Status: AC
  Filled 2018-12-05: qty 2

## 2018-12-05 MED ORDER — CHLORHEXIDINE GLUCONATE CLOTH 2 % EX PADS
6.0000 | MEDICATED_PAD | Freq: Every day | CUTANEOUS | Status: DC
  Administered 2018-12-05 – 2018-12-07 (×3): 6 via TOPICAL

## 2018-12-05 MED ORDER — LIDOCAINE HCL (CARDIAC) PF 100 MG/5ML IV SOSY
PREFILLED_SYRINGE | INTRAVENOUS | Status: DC | PRN
  Administered 2018-12-05: 100 mg via INTRATRACHEAL

## 2018-12-05 MED ORDER — OXYCODONE-ACETAMINOPHEN 5-325 MG PO TABS
1.0000 | ORAL_TABLET | ORAL | Status: DC | PRN
  Administered 2018-12-05 – 2018-12-07 (×7): 1 via ORAL
  Filled 2018-12-05 (×7): qty 1

## 2018-12-05 MED ORDER — CHLORHEXIDINE GLUCONATE 4 % EX LIQD
60.0000 mL | Freq: Once | CUTANEOUS | Status: DC
  Filled 2018-12-05: qty 30

## 2018-12-05 MED ORDER — FENTANYL CITRATE (PF) 250 MCG/5ML IJ SOLN
INTRAMUSCULAR | Status: AC
  Filled 2018-12-05: qty 5

## 2018-12-05 MED ORDER — FENTANYL CITRATE (PF) 250 MCG/5ML IJ SOLN
INTRAMUSCULAR | Status: DC | PRN
  Administered 2018-12-05 (×2): 50 ug via INTRAVENOUS

## 2018-12-05 MED ORDER — PROMETHAZINE HCL 25 MG/ML IJ SOLN: 6.2500 mg | INTRAMUSCULAR | Status: DC | PRN

## 2018-12-05 MED ORDER — BUPIVACAINE-EPINEPHRINE (PF) 0.25% -1:200000 IJ SOLN
INTRAMUSCULAR | Status: AC
  Filled 2018-12-05: qty 30

## 2018-12-05 MED ORDER — DEXTROSE 50 % IV SOLN
INTRAVENOUS | Status: AC
  Administered 2018-12-05: 25 mL via INTRAVENOUS
  Filled 2018-12-05: qty 50

## 2018-12-05 MED ORDER — LACTATED RINGERS IV SOLN
INTRAVENOUS | Status: DC
  Administered 2018-12-05: 18:00:00 via INTRAVENOUS

## 2018-12-05 MED ORDER — LIDOCAINE 2% (20 MG/ML) 5 ML SYRINGE
INTRAMUSCULAR | Status: AC
  Filled 2018-12-05: qty 10

## 2018-12-05 MED ORDER — PROPOFOL 10 MG/ML IV BOLUS
INTRAVENOUS | Status: DC | PRN
  Administered 2018-12-05: 30 mg via INTRAVENOUS
  Administered 2018-12-05: 50 mg via INTRAVENOUS
  Administered 2018-12-05: 20 mg via INTRAVENOUS
  Administered 2018-12-05: 50 mg via INTRAVENOUS

## 2018-12-05 MED ORDER — DEXTROSE 50 % IV SOLN
25.0000 mL | Freq: Once | INTRAVENOUS | Status: AC
  Administered 2018-12-05: 25 mL via INTRAVENOUS
  Filled 2018-12-05: qty 50

## 2018-12-05 MED ORDER — MEPERIDINE HCL 50 MG/ML IJ SOLN: 6.2500 mg | INTRAMUSCULAR | Status: DC | PRN

## 2018-12-05 MED ORDER — BUPIVACAINE HCL (PF) 0.5 % IJ SOLN
INTRAMUSCULAR | Status: AC
  Filled 2018-12-05: qty 30

## 2018-12-05 MED ORDER — ONDANSETRON HCL 4 MG/2ML IJ SOLN
4.0000 mg | Freq: Once | INTRAMUSCULAR | Status: AC
  Administered 2018-12-05: 4 mg via INTRAVENOUS
  Filled 2018-12-05: qty 2

## 2018-12-05 MED ORDER — FENTANYL CITRATE (PF) 100 MCG/2ML IJ SOLN: 25.0000 ug | INTRAMUSCULAR | Status: DC | PRN

## 2018-12-05 MED ORDER — VANCOMYCIN HCL 10 G IV SOLR
1250.0000 mg | Freq: Two times a day (BID) | INTRAVENOUS | Status: DC
  Administered 2018-12-06 (×2): 1250 mg via INTRAVENOUS
  Filled 2018-12-05 (×5): qty 1250

## 2018-12-05 MED ORDER — MIDAZOLAM HCL 2 MG/2ML IJ SOLN
INTRAMUSCULAR | Status: DC | PRN
  Administered 2018-12-05: 2 mg via INTRAVENOUS

## 2018-12-05 MED ORDER — MUPIROCIN 2 % EX OINT
1.0000 "application " | TOPICAL_OINTMENT | Freq: Two times a day (BID) | CUTANEOUS | Status: DC
  Administered 2018-12-05 – 2018-12-07 (×5): 1 via NASAL
  Filled 2018-12-05: qty 22

## 2018-12-05 MED ORDER — OXYCODONE HCL 5 MG PO TABS: 5.0000 mg | ORAL_TABLET | Freq: Once | ORAL | Status: DC | PRN

## 2018-12-05 MED ORDER — OXYCODONE HCL 5 MG/5ML PO SOLN: 5.0000 mg | Freq: Once | ORAL | Status: DC | PRN

## 2018-12-05 MED ORDER — LIDOCAINE HCL 1 % IJ SOLN
INTRAMUSCULAR | Status: DC | PRN
  Administered 2018-12-05: 30 mL

## 2018-12-05 MED ORDER — SODIUM CHLORIDE 0.9 % IV SOLN: 250.0000 mL | INTRAVENOUS | Status: DC | PRN

## 2018-12-05 MED ORDER — MORPHINE SULFATE (PF) 2 MG/ML IV SOLN
2.0000 mg | INTRAVENOUS | Status: DC | PRN
  Administered 2018-12-06 (×5): 2 mg via INTRAVENOUS
  Filled 2018-12-05 (×5): qty 1

## 2018-12-05 MED ORDER — 0.9 % SODIUM CHLORIDE (POUR BTL) OPTIME
TOPICAL | Status: DC | PRN
  Administered 2018-12-05: 1000 mL

## 2018-12-05 MED ORDER — SODIUM CHLORIDE 0.9% FLUSH
3.0000 mL | Freq: Two times a day (BID) | INTRAVENOUS | Status: DC
  Administered 2018-12-06: 3 mL via INTRAVENOUS

## 2018-12-05 MED ORDER — ONDANSETRON HCL 4 MG/2ML IJ SOLN
INTRAMUSCULAR | Status: AC
  Administered 2018-12-05: 4 mg via INTRAVENOUS
  Filled 2018-12-05: qty 2

## 2018-12-05 MED ORDER — LIDOCAINE-EPINEPHRINE 2 %-1:100000 IJ SOLN
INTRAMUSCULAR | Status: AC
  Filled 2018-12-05: qty 1

## 2018-12-05 MED ORDER — POVIDONE-IODINE 7.5 % EX SOLN
Freq: Once | CUTANEOUS | Status: DC
  Filled 2018-12-05: qty 118

## 2018-12-05 MED ORDER — SODIUM CHLORIDE 0.9% FLUSH: 3.0000 mL | INTRAVENOUS | Status: DC | PRN

## 2018-12-05 MED ORDER — POLYETHYLENE GLYCOL 3350 17 G PO PACK
17.0000 g | PACK | Freq: Every day | ORAL | Status: DC
  Administered 2018-12-05 – 2018-12-07 (×3): 17 g via ORAL
  Filled 2018-12-05 (×3): qty 1

## 2018-12-05 SURGICAL SUPPLY — 53 items
BANDAGE ACE 4X5 VEL STRL LF (GAUZE/BANDAGES/DRESSINGS) ×2 IMPLANT
BENZOIN TINCTURE PRP APPL 2/3 (GAUZE/BANDAGES/DRESSINGS) ×1 IMPLANT
BLADE AVERAGE 25MMX9MM (BLADE)
BLADE AVERAGE 25X9 (BLADE) IMPLANT
BLADE OSCILLATING /SAGITTAL (BLADE) ×3 IMPLANT
BLADE SURG 15 STRL LF DISP TIS (BLADE) IMPLANT
BLADE SURG 15 STRL SS (BLADE) ×2
BNDG GAUZE ELAST 4 BULKY (GAUZE/BANDAGES/DRESSINGS) ×3 IMPLANT
CHLORAPREP W/TINT 26ML (MISCELLANEOUS) ×2 IMPLANT
CLOSURE WOUND 1/2 X4 (GAUZE/BANDAGES/DRESSINGS)
COVER SURGICAL LIGHT HANDLE (MISCELLANEOUS) ×3 IMPLANT
COVER WAND RF STERILE (DRAPES) ×1 IMPLANT
CUFF TOURNIQUET SINGLE 18IN (TOURNIQUET CUFF) ×3 IMPLANT
CUFF TOURNIQUET SINGLE 24IN (TOURNIQUET CUFF) IMPLANT
DRSG PAD ABDOMINAL 8X10 ST (GAUZE/BANDAGES/DRESSINGS) ×3 IMPLANT
DRSG XEROFORM 1X8 (GAUZE/BANDAGES/DRESSINGS) ×2 IMPLANT
ELECT REM PT RETURN 9FT ADLT (ELECTROSURGICAL) ×3
ELECTRODE REM PT RTRN 9FT ADLT (ELECTROSURGICAL) IMPLANT
GAUZE IODOFORM PACK 1/2 7832 (GAUZE/BANDAGES/DRESSINGS) ×2 IMPLANT
GAUZE PACKING IODOFORM 1/4X15 (GAUZE/BANDAGES/DRESSINGS) ×1 IMPLANT
GAUZE SPONGE 4X4 12PLY STRL (GAUZE/BANDAGES/DRESSINGS) ×3 IMPLANT
GAUZE XEROFORM 1X8 LF (GAUZE/BANDAGES/DRESSINGS) ×3 IMPLANT
GLOVE BIO SURGEON STRL SZ8 (GLOVE) ×5 IMPLANT
GLOVE BIOGEL PI IND STRL 8 (GLOVE) ×1 IMPLANT
GLOVE BIOGEL PI INDICATOR 8 (GLOVE) ×2
GOWN STRL REUS W/ TWL LRG LVL3 (GOWN DISPOSABLE) ×2 IMPLANT
GOWN STRL REUS W/TWL LRG LVL3 (GOWN DISPOSABLE) ×4
KIT BASIN OR (CUSTOM PROCEDURE TRAY) ×3 IMPLANT
KIT TURNOVER KIT B (KITS) ×3 IMPLANT
NDL HYPO 25GX1X1/2 BEV (NEEDLE) IMPLANT
NEEDLE 27GAX1X1/2 (NEEDLE) ×1 IMPLANT
NEEDLE HYPO 25GX1X1/2 BEV (NEEDLE) ×9 IMPLANT
NS IRRIG 1000ML POUR BTL (IV SOLUTION) ×3 IMPLANT
PACK ORTHO EXTREMITY (CUSTOM PROCEDURE TRAY) ×3 IMPLANT
PAD ABD 8X10 STRL (GAUZE/BANDAGES/DRESSINGS) ×2 IMPLANT
PAD ARMBOARD 7.5X6 YLW CONV (MISCELLANEOUS) ×6 IMPLANT
PAD CAST 4YDX4 CTTN HI CHSV (CAST SUPPLIES) IMPLANT
PADDING CAST COTTON 4X4 STRL (CAST SUPPLIES)
SCRUB BETADINE 4OZ XXX (MISCELLANEOUS) ×1 IMPLANT
SOL PREP POV-IOD 4OZ 10% (MISCELLANEOUS) IMPLANT
STAPLER VISISTAT (STAPLE) ×2 IMPLANT
STAPLER VISISTAT 35W (STAPLE) IMPLANT
STRIP CLOSURE SKIN 1/2X4 (GAUZE/BANDAGES/DRESSINGS) ×1 IMPLANT
SUT PROLENE 4 0 PS 2 18 (SUTURE) ×1 IMPLANT
SUT VIC AB 3-0 PS2 18 (SUTURE) IMPLANT
SUT VICRYL 4-0 PS2 18IN ABS (SUTURE) ×3 IMPLANT
SWAB COLLECTION DEVICE MRSA (MISCELLANEOUS) ×2 IMPLANT
SYR CONTROL 10ML LL (SYRINGE) ×8 IMPLANT
TOWEL NATURAL 10PK STERILE (DISPOSABLE) ×3 IMPLANT
TUBE ANAEROBIC SPECIMEN COL (MISCELLANEOUS) ×2 IMPLANT
TUBE CONNECTING 12'X1/4 (SUCTIONS) ×1
TUBE CONNECTING 12X1/4 (SUCTIONS) ×2 IMPLANT
YANKAUER SUCT BULB TIP NO VENT (SUCTIONS) ×2 IMPLANT

## 2018-12-05 NOTE — Anesthesia Procedure Notes (Signed)
Procedure Name: MAC Date/Time: 12/05/2018 7:40 PM Performed by: Claris Che, CRNA Pre-anesthesia Checklist: Patient identified, Emergency Drugs available, Suction available, Patient being monitored and Timeout performed Oxygen Delivery Method: Simple face mask

## 2018-12-05 NOTE — Progress Notes (Signed)
Pharmacy Antibiotic Note  DESHAN HEMMELGARN is a 63 y.o. male admitted on 12/03/2018 with foot wound, Xray imaging concerning for osteomyelitis.  Pharmacy has been consulted for vancomycin and cefepime dosing.  On broad abx for R foot ulcer with drainage. Had been on Levaquin x1wk PTA, wound cont'd to worsen. Podiatry planning for resection of infected bone today. Afebrile, WBC wnl.  Plan: Change vancomycin to 1,250mg  IV Q12h Continue cefepime 2g IV Q8h Continue Flagyl 500mg  IV Q8h Monitor clinical picture, renal function, vanc levels prn F/U C&S, abx deescalation / LOT  Temp (24hrs), Avg:98.5 F (36.9 C), Min:98.4 F (36.9 C), Max:98.6 F (37 C)  Recent Labs  Lab 12/03/18 2232 12/03/18 2253 12/04/18 0421 12/04/18 1632 12/05/18 0441  WBC 11.2*  --  11.6*  --  10.4  CREATININE 0.81  --  0.78 0.72 0.75  LATICACIDVEN  --  1.20  --   --   --     Estimated Creatinine Clearance: 106.4 mL/min (by C-G formula based on SCr of 0.75 mg/dL).    Allergies  Allergen Reactions  . Vicodin [Hydrocodone-Acetaminophen] Itching    Thank you for allowing pharmacy to be a part of this patient's care.  Elenor Quinones, PharmD, BCPS, [facility] Clinical Pharmacist Phone number [phone] 12/05/2018 8:30 AM

## 2018-12-05 NOTE — Care Management Note (Signed)
Case Management Note  Patient Details  Name: Fernando Anthony MRN: [id number] Date of Birth: [birth year]  Subjective/Objective:                    Action/Plan:  Patient for surgery today. Will continue to follow for discharge needs. Expected Discharge Date:                  Expected Discharge Plan:  Home/Self Care  In-House Referral:     Discharge planning Services     Post Acute Care Choice:    Choice offered to:     DME Arranged:    DME Agency:     HH Arranged:    HH Agency:     Status of Service:  In process, will continue to follow  If discussed at Long Length of Stay Meetings, dates discussed:    Additional Comments:  Marilu Favre, RN 12/05/2018, 10:07 AM

## 2018-12-05 NOTE — Anesthesia Preprocedure Evaluation (Signed)
Anesthesia Evaluation  Patient identified by MRN, date of birth, ID band Patient awake    Reviewed: Allergy & Precautions, H&P , NPO status , Patient's Chart, lab work & pertinent test results  History of Anesthesia Complications Negative for: history of anesthetic complications  Airway Mallampati: I  TM Distance: >3 FB Neck ROM: Full    Dental  (+) Teeth Intact, Dental Advisory Given   Pulmonary Current Smoker,    breath sounds clear to auscultation       Cardiovascular hypertension,  Rhythm:Regular Rate:Normal     Neuro/Psych  Headaches,    GI/Hepatic   Endo/Other  diabetes, Type 2, Insulin Dependent  Renal/GU      Musculoskeletal   Abdominal (+) - obese,   Peds  Hematology   Anesthesia Other Findings   Reproductive/Obstetrics                             Anesthesia Physical  Anesthesia Plan  ASA: III  Anesthesia Plan: General   Post-op Pain Management:    Induction: Intravenous  PONV Risk Score and Plan: 2 and Ondansetron, Dexamethasone and Treatment may vary due to age or medical condition  Airway Management Planned: LMA  Additional Equipment: None  Intra-op Plan:   Post-operative Plan: Extubation in OR  Informed Consent: I have reviewed the patients History and Physical, chart, labs and discussed the procedure including the risks, benefits and alternatives for the proposed anesthesia with the patient or authorized representative who has indicated his/her understanding and acceptance.     Dental advisory given  Plan Discussed with: CRNA  Anesthesia Plan Comments:        Anesthesia Quick Evaluation

## 2018-12-05 NOTE — Anesthesia Postprocedure Evaluation (Signed)
Anesthesia Post Note  Patient: Fernando Anthony  Procedure(s) Performed: PARTIAL AMPUTATION FIRST RAY RIGHT FOOT (Right Foot)     Patient location during evaluation: PACU Anesthesia Type: MAC Level of consciousness: awake and alert Pain management: pain level controlled Vital Signs Assessment: post-procedure vital signs reviewed and stable Respiratory status: spontaneous breathing, nonlabored ventilation and respiratory function stable Cardiovascular status: stable and blood pressure returned to baseline Anesthetic complications: no    Last Vitals:  Vitals:   12/05/18 2100 12/05/18 2142  BP: (!) 172/94 (!) 169/98  Pulse: 84 73  Resp: 14 18  Temp: 36.7 C 36.8 C  SpO2: 99% 99%    Last Pain:  Vitals:   12/05/18 2142  TempSrc: Oral  PainSc:                  Audry Pili

## 2018-12-05 NOTE — Progress Notes (Signed)
Patient returned to unit from surgery. Alert and oriented x4. Patient states no pain at this moment. Dressing clean, dry, and intact. BP little elevated at 169/98. PRN hydralazine IV available to give. Will continue to monitor. Family at bedside.

## 2018-12-05 NOTE — Transfer of Care (Signed)
Immediate Anesthesia Transfer of Care Note  Patient: Fernando Anthony  Procedure(s) Performed: PARTIAL AMPUTATION FIRST RAY RIGHT FOOT (Right Foot)  Patient Location: PACU  Anesthesia Type:MAC  Level of Consciousness: awake, alert , oriented and patient cooperative  Airway & Oxygen Therapy: Patient Spontanous Breathing  Post-op Assessment: Report given to RN, Post -op Vital signs reviewed and stable and Patient moving all extremities X 4  Post vital signs: Reviewed and stable  Last Vitals:  Vitals Value Taken Time  BP    Temp    Pulse 79 12/05/2018  8:38 PM  Resp 12 12/05/2018  8:38 PM  SpO2 100 % 12/05/2018  8:38 PM  Vitals shown include unvalidated device data.  Last Pain:  Vitals:   12/05/18 1620  TempSrc:   PainSc: 5          Complications: No apparent anesthesia complications

## 2018-12-05 NOTE — Progress Notes (Signed)
Inpatient Diabetes Program Recommendations  AACE/ADA: New Consensus Statement on Inpatient Glycemic Control (2015)  Target Ranges:  Prepandial:   less than 140 mg/dL      Peak postprandial:   less than 180 mg/dL (1-2 hours)      Critically ill patients:  140 - 180 mg/dL   Lab Results  Component Value Date   GLUCAP 335 (H) 12/05/2018    Review of Glycemic ControlResults for BRALYN, FOLKERT (MRN [id number]) as of 12/05/2018 12:40  Ref. Range 12/04/2018 20:27 12/04/2018 23:41 12/05/2018 03:44 12/05/2018 08:19  Glucose-Capillary Latest Ref Range: 70 - 99 mg/dL 342 (H) 290 (H) 254 (H) 335 (H)    Diabetes history: DM 2 Outpatient Diabetes medications: Lantus 50 units q AM and 35 units q PM, Metformin 500 mg bid Current orders for Inpatient glycemic control:  Lantus 25 units bid, Novolog sensitive q 4 hours  Inpatient Diabetes Program Recommendations:    Please add A1C to current labs to assess home Glycemic control. Also please consider increasing Lantus to 35 units bid.  Once diet restarted, may also need the addition of Novolog meal coverage.   Will follow.   Thanks  Adah Perl, RN, BC-ADM Inpatient Diabetes Coordinator Pager [phone] (8a-5p)

## 2018-12-05 NOTE — Brief Op Note (Signed)
12/05/2018  9:00 PM  PATIENT:  Fernando Anthony  63 y.o. male  PRE-OPERATIVE DIAGNOSIS:  osteomyelitis  POST-OPERATIVE DIAGNOSIS:  osteomyelitis  PROCEDURE:  Procedure(s): PARTIAL AMPUTATION FIRST RAY RIGHT FOOT (Right)  SURGEON:  Surgeon(s) and Role:    Edrick Kins, DPM - Primary  PHYSICIAN ASSISTANT:   ASSISTANTS: none   ANESTHESIA:   local and MAC  EBL:  2 mL   BLOOD ADMINISTERED:none  DRAINS: none   LOCAL MEDICATIONS USED:  MARCAINE    and LIDOCAINE   SPECIMEN:  No Specimen  DISPOSITION OF SPECIMEN:  N/A  COUNTS:  YES  TOURNIQUET:   Total Tourniquet Time Documented: Calf (Right) - 22 minutes Total: Calf (Right) - 22 minutes   DICTATION: .Viviann Spare Dictation  PLAN OF CARE: Admit to inpatient   PATIENT DISPOSITION:  PACU - hemodynamically stable.   Delay start of Pharmacological VTE agent (>24hrs) due to surgical blood loss or risk of bleeding: not applicable

## 2018-12-05 NOTE — Progress Notes (Addendum)
PROGRESS NOTE   Patient seen and examined this morning.  Agree with this morning assessment and plan, please refer to the admission notes for more detail.   Fernando Anthony  ZOX:[id number] DOB: 03/17/1956 DOA: 12/03/2018 PCP: Curly Rim, MD   Brief Narrative: 63 year old male with history of uncontrolled diabetes, hypertension, hyperlipidemia, back pain, history of right great toe amputation been dealing with ulcer and wound on his first metatarsal head of right foot.  Seen by podiatry on 1/6 From where he was sent to the ER, was on antibiotics with improvement.  He again had worsening infection with abscess and discharge from the stump, was seen at podiatry clinic by Dr  Amalia Hailey 12/03/2018 and was sent to the hospital for admission.  He has been taking Levaquin 750 mg daily since 1/6.  Patient was placed on vancomycin cefepime and Flagyl and was admitted.  Assessment & Plan:   Ulceration and cellulitis and osteomyelitis of right foot in the setting on DM : x-ray showing osteomyelitis of the sesamoidal apparatus and the first metatarsal head: Overall clinically stable, mild leukocytosis resolved.  Continue on empiric vancomycin, cefepime and Flagyl. Wound culture no growth so far. I had spoken with Evans from podiatry, noted plan for surgery later today  Uncontrolled diabetes mellitus with diabetic nephropathy, insulin-dependent.  Managed with metformin and Lantus 50 U every morning and 35 U every afternoon.  Last hemoglobin in September more than 15.  Sugar poorly controlled, on Lantus 25 units, continue same dose, possible surgery in the evening noted, once he is off n.p.o. we will increase his Lantus, continue sliding scale.  Patient reports his blood sugar is anywhere from 150s to high 200s at home.  He is planning to see new endocrinologist soon. Recent Labs  Lab 12/04/18 1558 12/04/18 2027 12/04/18 2341 12/05/18 0344 12/05/18 0819  GLUCAP 270* 342* 290* 254* 335*     Hypertensive urgency: From noncompliance.Patient reports not taking medication for 2 to 3 days.  Blood pressure overall improving, continue on increased lisinopril, atenolol, IV hydralazine as needed.  Optimize his pain. Monitor and adjust BP medication  Abnormal LFTs: lfts trend as below.?  Etiology.  No alcohol abuse history.  Denies abdominal pain or abdominal tenderness. Right upper quadrant ultrasound unremarkable.  Continue to trend LFTs.    Recent Labs  Lab 12/03/18 2232 12/04/18 0421 12/05/18 0441  AST 192* 142* 137*  ALT 245* 215* 159*  ALKPHOS 364* 347* 335*  BILITOT 0.4 0.4 0.3  PROT 6.5 6.1* 5.3*  ALBUMIN 2.0* 2.0* 1.5*    Hyponatremia: Sodium improved. Continue gentle IV fluids.  DVT prophylaxis:SCD Code Status: FULL Family Communication: Wife at bedside. Disposition Plan: Remains inpatient.   Consultants: podiatry  Procedures: none  Antimicrobials: Vancomycin 12/04/2018 >> Cefepime  12/04/2018 >> Flagyl  12/04/2018 >>  Subjective: Seen/examined Resting well, pain controlled.awaiting for surgery  Objective: Vitals:   12/04/18 1404 12/04/18 2123 12/05/18 0142 12/05/18 0343  BP: (!) 159/81 (!) 176/88  (!) 154/84  Pulse: 87 83  73  Resp: 20 20  18   Temp: 98.4 F (36.9 C) 98.6 F (37 C)  98.4 F (36.9 C)  TempSrc: Oral Oral  Oral  SpO2: 97% 99%  99%  Weight:   77.6 kg   Height:   6\' 1"  (1.854 m)     Intake/Output Summary (Last 24 hours) at 12/05/2018 0919 Last data filed at 12/04/2018 1830 Gross per 24 hour  Intake 360 ml  Output 1 ml  Net 359  ml   Filed Weights   12/05/18 0142  Weight: 77.6 kg   Weight change:   Body mass index is 22.56 kg/m.  Examination:  General exam: Calm, comfortable, not in acute distress,average built.  HEENT:Oral mucosa moist, Ear/Nose WNL grossly, dentition normal. Respiratory system: Bilateral equal air entry, no crackles and wheezing, no use of accessory muscle. Cardiovascular system: regular rate and  rhythm, S1 & S2 heard, No JVD/murmurs.No pedal edema. Gastrointestinal system: Abdomen soft, nontender non-distended, BS +. No hepatosplenomegaly palpable. Nervous System:Alert, awake and oriented at baseline.Able to move UE and LE, sensation intact. Extremities: Minimal erythema on the right foot, right foot on dressing - did not remove it.  Picture from yesterday is attached. Distal peripheral pulses palpable. Skin: No rashes,no icterus. MSK: Normal muscle bulk,tone ,power     Medications:  Scheduled Meds: . atenolol  25 mg Oral QHS  . chlorhexidine  60 mL Topical Once  . Chlorhexidine Gluconate Cloth  6 each Topical Daily  . gabapentin  300 mg Oral BID  . insulin aspart  0-9 Units Subcutaneous Q4H  . insulin glargine  25 Units Subcutaneous BID  . lisinopril  20 mg Oral Daily  . mupirocin ointment  1 application Nasal BID  . nicotine  14 mg Transdermal Daily  . polyethylene glycol  17 g Oral Daily  . povidone-iodine   Topical Once  . senna-docusate  1 tablet Oral BID   Continuous Infusions: . ceFEPime (MAXIPIME) IV 2 g (12/05/18 0627)  . metronidazole 500 mg (12/05/18 0419)  . vancomycin      Data Reviewed: I have personally reviewed following labs and imaging studies  CBC: Recent Labs  Lab 12/03/18 2232 12/04/18 0421 12/05/18 0441  WBC 11.2* 11.6* 10.4  NEUTROABS 7.8* 8.0*  --   HGB 11.6* 12.4* 10.5*  HCT 36.6* 38.8* 32.3*  MCV 95.1 94.6 94.4  PLT 315 337 641   Basic Metabolic Panel: Recent Labs  Lab 12/03/18 2232 12/04/18 0421 12/04/18 1632 12/05/18 0441  NA 129* 134* 135 135  K 4.2 3.7 4.4 4.4  CL 96* 101 102 104  CO2 25 28 26 27   GLUCOSE 261* 122* 319* 302*  BUN 15 12 11 16   CREATININE 0.81 0.78 0.72 0.75  CALCIUM 8.1* 8.4* 7.9* 7.9*   GFR: Estimated Creatinine Clearance: 106.4 mL/min (by C-G formula based on SCr of 0.75 mg/dL). Liver Function Tests: Recent Labs  Lab 12/03/18 2232 12/04/18 0421 12/05/18 0441  AST 192* 142* 137*  ALT 245*  215* 159*  ALKPHOS 364* 347* 335*  BILITOT 0.4 0.4 0.3  PROT 6.5 6.1* 5.3*  ALBUMIN 2.0* 2.0* 1.5*   No results for input(s): LIPASE, AMYLASE in the last 168 hours. No results for input(s): AMMONIA in the last 168 hours. Coagulation Profile: No results for input(s): INR, PROTIME in the last 168 hours. Cardiac Enzymes: No results for input(s): CKTOTAL, CKMB, CKMBINDEX, TROPONINI in the last 168 hours. BNP (last 3 results) No results for input(s): PROBNP in the last 8760 hours. HbA1C: No results for input(s): HGBA1C in the last 72 hours. CBG: Recent Labs  Lab 12/04/18 1558 12/04/18 2027 12/04/18 2341 12/05/18 0344 12/05/18 0819  GLUCAP 270* 342* 290* 254* 335*   Lipid Profile: No results for input(s): CHOL, HDL, LDLCALC, TRIG, CHOLHDL, LDLDIRECT in the last 72 hours. Thyroid Function Tests: No results for input(s): TSH, T4TOTAL, FREET4, T3FREE, THYROIDAB in the last 72 hours. Anemia Panel: No results for input(s): VITAMINB12, FOLATE, FERRITIN, TIBC, IRON, RETICCTPCT in the  last 72 hours. Sepsis Labs: Recent Labs  Lab 12/03/18 2253  LATICACIDVEN 1.20    Recent Results (from the past 240 hour(s))  WOUND CULTURE     Status: None (Preliminary result)   Collection Time: 12/03/18  4:19 PM  Result Value Ref Range Status   MICRO NUMBER: [id number]  Preliminary   SPECIMEN QUALITY: Adequate  Preliminary   SOURCE: WOUND  Preliminary   STATUS: PRELIMINARY  Preliminary   GRAM STAIN: No organisms or white blood cells seen  Preliminary  Wound or Superficial Culture     Status: None (Preliminary result)   Collection Time: 12/04/18  4:27 AM  Result Value Ref Range Status   Specimen Description WOUND RIGHT FOOT  Final   Special Requests NONE  Final   Gram Stain   Final    MODERATE WBC PRESENT, PREDOMINANTLY PMN NO ORGANISMS SEEN Performed at [facility], [address], [place], [place] [address]    Culture PENDING  Incomplete   Report Status PENDING  Incomplete  Surgical  pcr screen     Status: Abnormal   Collection Time: 12/04/18 10:48 PM  Result Value Ref Range Status   MRSA, PCR NEGATIVE NEGATIVE Final   Staphylococcus aureus POSITIVE (A) NEGATIVE Final    Comment: (NOTE) The Xpert SA Assay (FDA approved for NASAL specimens in patients 84 years of age and older), is one component of a comprehensive surveillance program. It is not intended to diagnose infection nor to guide or monitor treatment.       Radiology Studies: Dg Foot Complete Right  Result Date: 12/03/2018 Please see detailed radiograph report in office note.  US Abdomen Limited Ruq  Result Date: 12/04/2018 CLINICAL DATA:  64 y/o  M; abnormal liver function test. EXAM: ULTRASOUND ABDOMEN LIMITED RIGHT UPPER QUADRANT COMPARISON:  None. FINDINGS: Gallbladder: No gallstones or wall thickening visualized. No sonographic Murphy sign noted by sonographer. Common bile duct: Diameter: 3.7 mm Liver: No focal lesion identified. Within normal limits in parenchymal echogenicity. Portal vein is patent on color Doppler imaging with normal direction of blood flow towards the liver. IMPRESSION: Negative right upper quadrant ultrasound. Electronically Signed   By: Kristine Garbe M.D.   On: 12/04/2018 05:04      LOS: 1 day   Time spent: More than 50% of that time was spent in counseling and/or coordination of care.  Antonieta Pert, MD [facility]  12/05/2018, 9:19 AM

## 2018-12-06 ENCOUNTER — Encounter (HOSPITAL_COMMUNITY): Payer: Self-pay | Admitting: Podiatry

## 2018-12-06 ENCOUNTER — Inpatient Hospital Stay (HOSPITAL_COMMUNITY): Payer: BLUE CROSS/BLUE SHIELD

## 2018-12-06 DIAGNOSIS — Z885 Allergy status to narcotic agent status: Secondary | ICD-10-CM

## 2018-12-06 DIAGNOSIS — M869 Osteomyelitis, unspecified: Secondary | ICD-10-CM

## 2018-12-06 DIAGNOSIS — Z89431 Acquired absence of right foot: Secondary | ICD-10-CM

## 2018-12-06 DIAGNOSIS — F17210 Nicotine dependence, cigarettes, uncomplicated: Secondary | ICD-10-CM

## 2018-12-06 DIAGNOSIS — E1169 Type 2 diabetes mellitus with other specified complication: Principal | ICD-10-CM

## 2018-12-06 LAB — WOUND CULTURE
GRAM STAIN: NONE SEEN
MICRO NUMBER:: 46661
SPECIMEN QUALITY:: ADEQUATE

## 2018-12-06 LAB — BASIC METABOLIC PANEL
ANION GAP: 5 (ref 5–15)
BUN: 17 mg/dL (ref 8–23)
CALCIUM: 7.6 mg/dL — AB (ref 8.9–10.3)
CO2: 25 mmol/L (ref 22–32)
Chloride: 104 mmol/L (ref 98–111)
Creatinine, Ser: 0.8 mg/dL (ref 0.61–1.24)
GFR calc Af Amer: >60 mL/min (ref >60)
GFR calc non Af Amer: >60 mL/min (ref >60)
Glucose, Bld: 350 mg/dL — ABNORMAL HIGH (ref 70–99)
Potassium: 4.7 mmol/L (ref 3.5–5.1)
Sodium: 134 mmol/L — ABNORMAL LOW (ref 135–145)

## 2018-12-06 LAB — AEROBIC CULTURE W GRAM STAIN (SUPERFICIAL SPECIMEN)

## 2018-12-06 LAB — GLUCOSE, CAPILLARY
Glucose-Capillary: 154 mg/dL — ABNORMAL HIGH (ref 70–99)
Glucose-Capillary: 169 mg/dL — ABNORMAL HIGH (ref 70–99)
Glucose-Capillary: 196 mg/dL — ABNORMAL HIGH (ref 70–99)
Glucose-Capillary: 256 mg/dL — ABNORMAL HIGH (ref 70–99)
Glucose-Capillary: 281 mg/dL — ABNORMAL HIGH (ref 70–99)
Glucose-Capillary: 315 mg/dL — ABNORMAL HIGH (ref 70–99)
Glucose-Capillary: 92 mg/dL (ref 70–99)

## 2018-12-06 LAB — AEROBIC CULTURE  (SUPERFICIAL SPECIMEN)

## 2018-12-06 MED ORDER — INSULIN ASPART 100 UNIT/ML ~~LOC~~ SOLN
0.0000 [IU] | Freq: Three times a day (TID) | SUBCUTANEOUS | Status: DC
  Administered 2018-12-06: 2 [IU] via SUBCUTANEOUS
  Administered 2018-12-06: 5 [IU] via SUBCUTANEOUS
  Administered 2018-12-06 – 2018-12-07 (×2): 2 [IU] via SUBCUTANEOUS

## 2018-12-06 NOTE — Progress Notes (Signed)
   PODIATRY POSTOPERATIVE PROGRESS NOTE:  Patient status post partial first ray amputation with incision and drainage right foot secondary to acute osteomyelitis and infection. DOS: 12/05/2018.  Patient says that he is feeling significantly better.  He notices reduction in the edema and swelling with redness of the foot and ankle.  Patient is been resting comfortably all day with dressings clean dry and intact.  Past Medical History:  Diagnosis Date  . Back pain   . Diabetes mellitus   . Headache(784.0)    general  . Hyperlipidemia   . Hypertension   . Osteomyelitis ([place]) 11/2018   RIGHT FOOT     CBC    Component Value Date/Time   WBC 10.4 12/05/2018 0441   RBC 3.42 (L) 12/05/2018 0441   HGB 10.5 (L) 12/05/2018 0441   HCT 32.3 (L) 12/05/2018 0441   PLT 317 12/05/2018 0441   MCV 94.4 12/05/2018 0441   MCH 30.7 12/05/2018 0441   MCHC 32.5 12/05/2018 0441   RDW 11.9 12/05/2018 0441   LYMPHSABS 1.6 12/04/2018 0421   MONOABS 1.5 (H) 12/04/2018 0421   EOSABS 0.1 12/04/2018 0421   BASOSABS 0.0 12/04/2018 0421    Physical Exam: Neurovascular status intact.  Incision site looks well coapted with significant improvement of erythema and edema.  Acute cellulitis appears to be improved significantly.  Assessment: 1.  Status post partial first ray amputation right with incision and drainage DOS: 12/05/2018   Plan of Care:  1. Patient evaluated.  2.  From a surgical podiatry standpoint, I believe the patient is okay to be discharged on oral antibiotics.  Infectious disease came and offered there recommendation regarding antibiotic therapy post discharge. 3.  Instructed the patient to call our office tomorrow morning and set up an appointment for follow-up post discharge on Monday, 12/10/2018. 4.  Podiatry will sign off and patient will follow-up post discharge      Edrick Kins, DPM [facility]  Dr. Edrick Kins, DPM    [address] [place], [place] [address]                Office [phone]  Fax [phone]

## 2018-12-06 NOTE — Op Note (Signed)
OPERATIVE REPORT Patient name: Fernando Anthony MRN: [id number] DOB: 1956/06/13  DOS:  12/05/2018  Preop Dx: osteomyelitis right foot. Abscess right foot. Ulcer right foot Postop Dx: same  Procedure:  1.  Partial first ray amputation right foot 2.  Incision and drainage right foot  Surgeon: Edrick Kins DPM  Anesthesia: 50-50 mixture of 2% lidocaine plain with 0.5% Marcaine plain totaling 20 cc infiltrated in the patient's right lower extremity  Hemostasis: Ankle tourniquet inflated to a pressure of 251mmHg without Esmarch exsanguination   EBL: Minimal mL Materials: None Injectables: None Pathology: Deep wound culture sent to pathology for culture and sensitivity  Condition: The patient tolerated the procedure and anesthesia well. No complications noted or reported   Justification for procedure: The patient is a 63 y.o. male with a history of uncontrolled diabetes mellitus who presents today for surgical correction of acute osteomyelitis with abscess and an ulceration to the plantar aspect of the first MTPJ right foot. All conservative modalities of been unsuccessful in providing any sort of satisfactory alleviation of symptoms with the patient. The patient was told benefits as well as possible side effects of the surgery. The patient consented for surgical correction. The patient consent form was reviewed. All patient questions were answered. No guarantees were expressed or implied. The patient and the surgeon boson the patient consent form with the witness present and placed in the patient's chart.   Procedure in Detail: The patient was brought to the operating room, placed in the operating table in the supine position at which time an aseptic scrub and drape were performed about the patient's respective lower extremity after anesthesia was induced as described above. Attention was then directed to the surgical area where procedure number one commenced.  Procedure #1: Partial  first ray amputation right foot 5 cm linear longitudinal skin incision was planned and made overlying the first metatarsal right foot.  Patient has a history of right great toe amputation.  Incision carried down to the level of bone with care taken to cut clamp ligate and retract away all small neurovascular structures traversing the incision site.  Sharp soft tissue dissection utilized to expose the distal one half of the first metatarsal.  Osteotomy performed at the diaphysis of the first metatarsal proximal to the osteolytic portions of necrotic bone.  The distal portion of the metatarsal was removed in toto as well as the sesamoidal apparatus.  Attention was then directed to the soft tissue structures of the foot where procedure 2 commenced.  Procedure #2: Incision and drainage right foot Blunt soft tissue dissection was utilized using a curved mosquito of the surrounding soft tissue compartments of the foot.  Intraoperative evaluation did indicate proximal tracking of the infection along the plantar compartments of the foot.  Soft tissue edema with swelling and significant serous drainage was noted along the plantar compartments.  Negative for any significant purulent drainage, however there was a significant amount of necrotic looking, nonviable, infected tissue along the plantar compartments of the foot with incision and drainage. Copious irrigation was then utilized and the wound was dried in preparation for primary closure.  Stainless steel skin staples were utilized to reapproximate superficial skin edges.  Procedure #3: Debridement of ulcer right foot Attention was then directed to the plantar aspect of the foot with the underlying diabetic foot ulcer.  Medically necessary excisional debridement including muscle and deep fascial tissue was performed using a surgical #15 blade.  Excisional debridement of all the necrotic nonviable  tissue down to healthy bleeding viable tissue was performed with  post remeasurement same as pre-.  Wound measures approximately 1.01.0 (LxW) with extension of the wound into the soft tissue void where the partial first ray amputation was performed.  Plantar ulceration was packed with 1/2 inch iodoform gauze packing.  Dry sterile compressive dressings were then applied to all previously mentioned incision sites about the patient's lower extremity. The tourniquet which was used for hemostasis was deflated. All normal neurovascular responses including pink color and warmth returned all the digits of patient's lower extremity with the exception of a partial first ray amputation  The patient was then transferred from the operating room to the recovery room having tolerated the procedure and anesthesia well. All vital signs are stable. After a brief stay in the recovery room the patient was readmitted with adequate prescriptions for analgesia. Verbal as well as written instructions were provided for the patient regarding wound care. The patient is to keep the dressings clean dry and intact until they are to follow surgeon Dr. Daylene Katayama in the office upon discharge.   Surgical impression: The distal portion of the first metatarsal at the osteotomy site still looks somewhat unhealthy.  Also concerning was the tracking of infection along the plantar compartment of the foot extending proximal along the flexor tendon layers.  I do not believe the patient will require IV antibiotics, however it may be beneficial to have infectious disease consult pending culture results, since the patient has very acute onset of recurrent cellulitis and infection.  Edrick Kins, DPM [facility]  Dr. Edrick Kins, [place]                                        [place], [place] [address]                Office [phone]  Fax [phone]

## 2018-12-06 NOTE — Consult Note (Signed)
[place] for Infectious Disease       Reason for Consult: osteomyelitis    Referring Physician: Dr. Lupita Leash  Principal Problem:   Osteomyelitis of right foot [facility]) Active Problems:   Uncontrolled diabetes mellitus with diabetic nephropathy ([place])   Hypertensive urgency   Abnormal LFTs   Hyponatremia   Osteomyelitis ([place])   . atenolol  25 mg Oral QHS  . Chlorhexidine Gluconate Cloth  6 each Topical Daily  . gabapentin  300 mg Oral BID  . insulin aspart  0-9 Units Subcutaneous TID AC & HS  . insulin glargine  25 Units Subcutaneous BID  . lisinopril  20 mg Oral Daily  . mupirocin ointment  1 application Nasal BID  . nicotine  14 mg Transdermal Daily  . polyethylene glycol  17 g Oral Daily  . senna-docusate  1 tablet Oral BID  . sodium chloride flush  3 mL Intravenous Q12H    Recommendations: Stop cefepime, flagyl Continue vancomycin for now  If no growth, will consider Keflex at discharge for 5-7 days  Assessment: He has osteomyelitis now s/p amputation.  No op report yet and path pending.  Noted 'tracking' during surgery with abscess and asked to consult for treatment recommendations.  GPC on gram stain. No growth on culture  Antibiotics: Vancomycin, cefepime, flagyl Has been on levaquin  HPI: Fernando Anthony is a 63 y.o. male with poorly controlled diabetes and chronic foot wound managed by Dr. Amalia Hailey and initially started on Bactrim and Keflex from an ED visit in October and sent to Podiatry.  Underwent debridement and continued on antibiotics, changed to levaquin at a high dose for 10 days in December.  Again seen in January and excisional debridement and more high dose levaquin but progressed and sent to the ED for management.  Admitted and underwent amputation of infected bone.  Noted areas of tracking and abscess.  Has had superficial cultures done with Myroides and Staph aureus.  Had a surgical culture done with surgery but has been on broad spectrum antibiotics.      Review of Systems:  Constitutional: negative for fevers, chills and anorexia Respiratory: negative for cough Gastrointestinal: negative for nausea and diarrhea All other systems reviewed and are negative    Past Medical History:  Diagnosis Date  . Back pain   . Diabetes mellitus   . Headache(784.0)    general  . Hyperlipidemia   . Hypertension   . Osteomyelitis ([place]) 11/2018   RIGHT FOOT    Social History   Tobacco Use  . Smoking status: Current Every Day Smoker    Packs/day: 1.00    Years: 40.00    Pack years: 40.00    Types: Cigarettes  . Smokeless tobacco: Never Used  Substance Use Topics  . Alcohol use: Not Currently  . Drug use: Not Currently    Comment: past use of marijuana    Family History  Problem Relation Age of Onset  . Cancer Mother   . Heart disease Father     Allergies  Allergen Reactions  . Vicodin [Hydrocodone-Acetaminophen] Itching    Physical Exam: Constitutional: in no apparent distress  Vitals:   12/06/18 0405 12/06/18 1423  BP: (!) 141/70 (!) 147/78  Pulse: 70 68  Resp: 16 14  Temp: 98.8 F (37.1 C) 98.3 F (36.8 C)  SpO2: 96% 97%   EYES: anicteric ENMT: no thrush Cardiovascular: Cor RRR Respiratory: CTA B; normal respiratory effort GI: Bowel sounds are normal, liver is not  enlarged, spleen is not enlarged Musculoskeletal: no pedal edema noted Skin: negatives: no rash Neuro: non-focal  Lab Results  Component Value Date   WBC 10.4 12/05/2018   HGB 10.5 (L) 12/05/2018   HCT 32.3 (L) 12/05/2018   MCV 94.4 12/05/2018   PLT 317 12/05/2018    Lab Results  Component Value Date   CREATININE 0.80 12/06/2018   BUN 17 12/06/2018   NA 134 (L) 12/06/2018   K 4.7 12/06/2018   CL 104 12/06/2018   CO2 25 12/06/2018    Lab Results  Component Value Date   ALT 159 (H) 12/05/2018   AST 137 (H) 12/05/2018   ALKPHOS 335 (H) 12/05/2018     Microbiology: Recent Results (from the past 240 hour(s))  WOUND CULTURE     Status:  Abnormal (Preliminary result)   Collection Time: 12/03/18  4:19 PM  Result Value Ref Range Status   MICRO NUMBER: [id number]  Preliminary   SPECIMEN QUALITY: Adequate  Preliminary   SOURCE: WOUND  Preliminary   STATUS: PRELIMINARY  Preliminary   GRAM STAIN: No organisms or white blood cells seen  Preliminary   ISOLATE 1: Staphylococcus aureus (A)  Preliminary    Comment: Scant growth of Staphylococcus aureus , susceptibility test report to follow.  Wound or Superficial Culture     Status: None   Collection Time: 12/04/18  4:27 AM  Result Value Ref Range Status   Specimen Description WOUND RIGHT FOOT  Final   Special Requests NONE  Final   Gram Stain   Final    MODERATE WBC PRESENT, PREDOMINANTLY PMN NO ORGANISMS SEEN Performed at [facility], [address], [place], [place] [address]    Culture RARE STAPHYLOCOCCUS AUREUS  Final   Report Status 12/06/2018 FINAL  Final   Organism ID, Bacteria STAPHYLOCOCCUS AUREUS  Final      Susceptibility   Staphylococcus aureus - MIC*    CIPROFLOXACIN <=0.5 SENSITIVE Sensitive     ERYTHROMYCIN <=0.25 SENSITIVE Sensitive     GENTAMICIN <=0.5 SENSITIVE Sensitive     OXACILLIN 0.5 SENSITIVE Sensitive     TETRACYCLINE <=1 SENSITIVE Sensitive     VANCOMYCIN 1 SENSITIVE Sensitive     TRIMETH/SULFA <=10 SENSITIVE Sensitive     CLINDAMYCIN <=0.25 SENSITIVE Sensitive     RIFAMPIN <=0.5 SENSITIVE Sensitive     Inducible Clindamycin NEGATIVE Sensitive     * RARE STAPHYLOCOCCUS AUREUS  Surgical pcr screen     Status: Abnormal   Collection Time: 12/04/18 10:48 PM  Result Value Ref Range Status   MRSA, PCR NEGATIVE NEGATIVE Final   Staphylococcus aureus POSITIVE (A) NEGATIVE Final    Comment: (NOTE) The Xpert SA Assay (FDA approved for NASAL specimens in patients 52 years of age and older), is one component of a comprehensive surveillance program. It is not intended to diagnose infection nor to guide or monitor treatment.   Aerobic/Anaerobic  Culture (surgical/deep wound)     Status: None (Preliminary result)   Collection Time: 12/05/18  8:01 PM  Result Value Ref Range Status   Specimen Description ABSCESS  Final   Special Requests   Final    RT 1ST RAY AMPUTATION AND ABSCESS PT ON LEVAQUIN FLAGYL   Gram Stain   Final    FEW WBC PRESENT,BOTH PMN AND MONONUCLEAR RARE GRAM POSITIVE COCCI    Culture   Final    NO GROWTH < 24 HOURS Performed at [facility], [place] [address], [place], [place] [address]  Report Status PENDING  Incomplete    Thayer Headings, [place] for Infectious Disease [place] [email or web address] [phone] pager  [phone] cell 12/06/2018, 4:19 PM

## 2018-12-06 NOTE — Progress Notes (Signed)
PROGRESS NOTE   Patient seen and examined this morning.  Agree with this morning assessment and plan, please refer to the admission notes for more detail.   Fernando Anthony  JGG:[id number] DOB: 09-02-1956 DOA: 12/03/2018 PCP: Curly Rim, MD   Brief Narrative: 63 year old male with history of uncontrolled diabetes, hypertension, hyperlipidemia, back pain, history of right great toe amputation been dealing with ulcer and wound on his first metatarsal head of right foot.  Seen by podiatry on 1/6 From where he was sent to the ER, was on antibiotics with improvement.  He again had worsening infection with abscess and discharge from the stump, was seen at podiatry clinic by Dr  Amalia Hailey 12/03/2018 and was sent to the hospital for admission.  He has been taking Levaquin 750 mg daily since 1/6.  Patient was placed on vancomycin cefepime and Flagyl and was admitted.  Assessment & Plan:   Ulceration and cellulitis and osteomyelitis of right foot in the setting on DM : x-ray showing osteomyelitis of the sesamoidal apparatus and the first metatarsal head: Underwent partial first ray amputation by podiatry 1/16.  Spoke with Dr. A1c was concerned about possible tracking he noted during surgery, culture from the OR sample growing gram-positive cocci.  I spoke with Dr. Linus Salmons  from infectious disease for consultation. Remains on empiric vancomycin, cefepime and Flagyl.   Uncontrolled diabetes mellitus with diabetic nephropathy, insulin-dependent.  Managed with metformin and Lantus 50 U every morning and 35 U every afternoon.  Last hemoglobin in September more than 15.  Sugar fairly stable, continue on current Lantus 25 units, continue sliding scale insulin.   Patient reports his blood sugar is anywhere from 150s to high 200s at home.  He is planning to see new endocrinologist soon.  Recent Labs  Lab 12/05/18 2043 12/05/18 2117 12/06/18 0014 12/06/18 0412 12/06/18 0801  GLUCAP 75 98 315* 256* 196*     Hypertensive urgency: From noncompliance.Patient reports not taking medication for 2 to 3 days PTA.  Blood pressure overall improved, continue on increased lisinopril, home atenolol, IV hydralazine as needed.  Optimize his pain. Cont to monitor and adjust BP medication  Abnormal LFTs: lfts has donwtrended?  Etiology.  No alcohol abuse history.  Denies abdominal pain or abdominal tenderness. Right upper quadrant ultrasound unremarkable. Check viral hepatitis panel.  Recent Labs  Lab 12/03/18 2232 12/04/18 0421 12/05/18 0441  AST 192* 142* 137*  ALT 245* 215* 159*  ALKPHOS 364* 347* 335*  BILITOT 0.4 0.4 0.3  PROT 6.5 6.1* 5.3*  ALBUMIN 2.0* 2.0* 1.5*    Hyponatremia: Resolved. Cut down ivf   DVT prophylaxis:SCD Code Status: FULL Family Communication: none  at bedside. Disposition Plan: Remains inpatient.   Consultants: podiatry  Procedures: none  Antimicrobials: Vancomycin 12/04/2018 >> Cefepime  12/04/2018 >> Flagyl  12/04/2018 >>  Subjective: Seen/Examined.  No new complaints.  He is resting comfortably, pain is controlled on oral medication. Denies fever, nausea, vomiting, chest pain.  Objective: Vitals:   12/05/18 2100 12/05/18 2142 12/06/18 0205 12/06/18 0405  BP: (!) 172/94 (!) 169/98 (!) 153/75 (!) 141/70  Pulse: 84 73 62 70  Resp: 14 18 14 16   Temp: 98.1 F (36.7 C) 98.2 F (36.8 C) 98.9 F (37.2 C) 98.8 F (37.1 C)  TempSrc:  Oral Oral Oral  SpO2: 99% 99% 98% 96%  Weight:      Height:        Intake/Output Summary (Last 24 hours) at 12/06/2018 1113 Last data filed  at 12/06/2018 0533 Gross per 24 hour  Intake 3875.02 ml  Output 2 ml  Net 3873.02 ml   Filed Weights   12/05/18 0142 12/05/18 1729  Weight: 77.6 kg 77.6 kg   Weight change: 0 kg  Body mass index is 22.57 kg/m.  Examination:  General exam: Calm, comfortable, not in acute distress,average built.  HEENT:Oral mucosa moist, Ear/Nose WNL grossly, dentition normal. Respiratory  system: Bilateral equal air entry, no crackles and wheezing, no use of accessory muscle. Cardiovascular system: regular rate and rhythm, S1 & S2 heard, No JVD/murmurs.No pedal edema. Gastrointestinal system: Abdomen soft, nontender non-distended, BS +. No hepatosplenomegaly palpable. Nervous System:Alert, awake and oriented at baseline.Able to move UE and LE, sensation intact. Extremities: No edema, distal peripheral pulses palpable.  Right foot in dressing- didnot remove as podiatry will be seeing the patient. Skin: No rashes,no icterus. MSK: Normal muscle bulk,tone ,power  Medications:  Scheduled Meds: . atenolol  25 mg Oral QHS  . Chlorhexidine Gluconate Cloth  6 each Topical Daily  . gabapentin  300 mg Oral BID  . insulin aspart  0-9 Units Subcutaneous TID AC & HS  . insulin glargine  25 Units Subcutaneous BID  . lisinopril  20 mg Oral Daily  . mupirocin ointment  1 application Nasal BID  . nicotine  14 mg Transdermal Daily  . polyethylene glycol  17 g Oral Daily  . senna-docusate  1 tablet Oral BID  . sodium chloride flush  3 mL Intravenous Q12H   Continuous Infusions: . sodium chloride    . ceFEPime (MAXIPIME) IV 2 g (12/06/18 7341)  . lactated ringers 10 mL/hr at 12/05/18 1738  . metronidazole 500 mg (12/06/18 1008)  . vancomycin 1,250 mg (12/06/18 0747)    Data Reviewed: I have personally reviewed following labs and imaging studies  CBC: Recent Labs  Lab 12/03/18 2232 12/04/18 0421 12/05/18 0441  WBC 11.2* 11.6* 10.4  NEUTROABS 7.8* 8.0*  --   HGB 11.6* 12.4* 10.5*  HCT 36.6* 38.8* 32.3*  MCV 95.1 94.6 94.4  PLT 315 337 937   Basic Metabolic Panel: Recent Labs  Lab 12/03/18 2232 12/04/18 0421 12/04/18 1632 12/05/18 0441 12/06/18 0253  NA 129* 134* 135 135 134*  K 4.2 3.7 4.4 4.4 4.7  CL 96* 101 102 104 104  CO2 25 28 26 27 25   GLUCOSE 261* 122* 319* 302* 350*  BUN 15 12 11 16 17   CREATININE 0.81 0.78 0.72 0.75 0.80  CALCIUM 8.1* 8.4* 7.9* 7.9* 7.6*    GFR: Estimated Creatinine Clearance: 106.4 mL/min (by C-G formula based on SCr of 0.8 mg/dL). Liver Function Tests: Recent Labs  Lab 12/03/18 2232 12/04/18 0421 12/05/18 0441  AST 192* 142* 137*  ALT 245* 215* 159*  ALKPHOS 364* 347* 335*  BILITOT 0.4 0.4 0.3  PROT 6.5 6.1* 5.3*  ALBUMIN 2.0* 2.0* 1.5*   No results for input(s): LIPASE, AMYLASE in the last 168 hours. No results for input(s): AMMONIA in the last 168 hours. Coagulation Profile: No results for input(s): INR, PROTIME in the last 168 hours. Cardiac Enzymes: No results for input(s): CKTOTAL, CKMB, CKMBINDEX, TROPONINI in the last 168 hours. BNP (last 3 results) No results for input(s): PROBNP in the last 8760 hours. HbA1C: No results for input(s): HGBA1C in the last 72 hours. CBG: Recent Labs  Lab 12/05/18 2043 12/05/18 2117 12/06/18 0014 12/06/18 0412 12/06/18 0801  GLUCAP 75 98 315* 256* 196*   Lipid Profile: No results for input(s): CHOL, HDL,  LDLCALC, TRIG, CHOLHDL, LDLDIRECT in the last 72 hours. Thyroid Function Tests: No results for input(s): TSH, T4TOTAL, FREET4, T3FREE, THYROIDAB in the last 72 hours. Anemia Panel: No results for input(s): VITAMINB12, FOLATE, FERRITIN, TIBC, IRON, RETICCTPCT in the last 72 hours. Sepsis Labs: Recent Labs  Lab 12/03/18 2253  LATICACIDVEN 1.20    Recent Results (from the past 240 hour(s))  WOUND CULTURE     Status: None (Preliminary result)   Collection Time: 12/03/18  4:19 PM  Result Value Ref Range Status   MICRO NUMBER: [id number]  Preliminary   SPECIMEN QUALITY: Adequate  Preliminary   SOURCE: WOUND  Preliminary   STATUS: PRELIMINARY  Preliminary   GRAM STAIN: No organisms or white blood cells seen  Preliminary  Wound or Superficial Culture     Status: None   Collection Time: 12/04/18  4:27 AM  Result Value Ref Range Status   Specimen Description WOUND RIGHT FOOT  Final   Special Requests NONE  Final   Gram Stain   Final    MODERATE WBC PRESENT,  PREDOMINANTLY PMN NO ORGANISMS SEEN Performed at [facility], [address], [place], [place] [address]    Culture RARE STAPHYLOCOCCUS AUREUS  Final   Report Status 12/06/2018 FINAL  Final   Organism ID, Bacteria STAPHYLOCOCCUS AUREUS  Final      Susceptibility   Staphylococcus aureus - MIC*    CIPROFLOXACIN <=0.5 SENSITIVE Sensitive     ERYTHROMYCIN <=0.25 SENSITIVE Sensitive     GENTAMICIN <=0.5 SENSITIVE Sensitive     OXACILLIN 0.5 SENSITIVE Sensitive     TETRACYCLINE <=1 SENSITIVE Sensitive     VANCOMYCIN 1 SENSITIVE Sensitive     TRIMETH/SULFA <=10 SENSITIVE Sensitive     CLINDAMYCIN <=0.25 SENSITIVE Sensitive     RIFAMPIN <=0.5 SENSITIVE Sensitive     Inducible Clindamycin NEGATIVE Sensitive     * RARE STAPHYLOCOCCUS AUREUS  Surgical pcr screen     Status: Abnormal   Collection Time: 12/04/18 10:48 PM  Result Value Ref Range Status   MRSA, PCR NEGATIVE NEGATIVE Final   Staphylococcus aureus POSITIVE (A) NEGATIVE Final    Comment: (NOTE) The Xpert SA Assay (FDA approved for NASAL specimens in patients 94 years of age and older), is one component of a comprehensive surveillance program. It is not intended to diagnose infection nor to guide or monitor treatment.   Aerobic/Anaerobic Culture (surgical/deep wound)     Status: None (Preliminary result)   Collection Time: 12/05/18  8:01 PM  Result Value Ref Range Status   Specimen Description ABSCESS  Final   Special Requests   Final    RT 1ST RAY AMPUTATION AND ABSCESS PT ON LEVAQUIN FLAGYL   Gram Stain   Final    FEW WBC PRESENT,BOTH PMN AND MONONUCLEAR RARE GRAM POSITIVE COCCI    Culture   Final    NO GROWTH < 24 HOURS Performed at [facility], [place] [address] [place], [place] [address]    Report Status PENDING  Incomplete      Radiology Studies: No results found.    LOS: 2 days   Time spent: More than 50% of that time was spent in counseling and/or coordination of care.  Antonieta Pert, MD [facility]  12/06/2018, 11:13 AM

## 2018-12-07 LAB — BASIC METABOLIC PANEL
Anion gap: 5 (ref 5–15)
BUN: 17 mg/dL (ref 8–23)
CO2: 25 mmol/L (ref 22–32)
Calcium: 7.9 mg/dL — ABNORMAL LOW (ref 8.9–10.3)
Chloride: 103 mmol/L (ref 98–111)
Creatinine, Ser: 0.86 mg/dL (ref 0.61–1.24)
GFR calc non Af Amer: >60 mL/min (ref >60)
Glucose, Bld: 182 mg/dL — ABNORMAL HIGH (ref 70–99)
Potassium: 4.2 mmol/L (ref 3.5–5.1)
SODIUM: 133 mmol/L — AB (ref 135–145)

## 2018-12-07 LAB — GLUCOSE, CAPILLARY
GLUCOSE-CAPILLARY: 90 mg/dL (ref 70–99)
Glucose-Capillary: 167 mg/dL — ABNORMAL HIGH (ref 70–99)

## 2018-12-07 LAB — HEPATITIS PANEL, ACUTE
HCV Ab: <0.1 s/co ratio (ref 0.0–0.9)
Hep A IgM: NEGATIVE
Hep B C IgM: NEGATIVE
Hepatitis B Surface Ag: NEGATIVE

## 2018-12-07 MED ORDER — CEPHALEXIN 500 MG PO CAPS: 500.0000 mg | ORAL_CAPSULE | Freq: Four times a day (QID) | ORAL | Status: DC

## 2018-12-07 MED ORDER — OXYCODONE-ACETAMINOPHEN 5-325 MG PO TABS: 1.0000 | ORAL_TABLET | Freq: Four times a day (QID) | ORAL | 0 refills | Status: DC | PRN

## 2018-12-07 MED ORDER — CEPHALEXIN 500 MG PO CAPS: 500.0000 mg | ORAL_CAPSULE | Freq: Four times a day (QID) | ORAL | 0 refills | Status: AC

## 2018-12-07 NOTE — Progress Notes (Signed)
Patient discharged to home with instructions, dressings to right foot re-enforced.

## 2018-12-07 NOTE — Progress Notes (Signed)
Patients foot started to bleed through bandage during night shift while going back and forth to bathroom. Dressing reinforced with gauze and Kerlix.

## 2018-12-07 NOTE — Discharge Summary (Signed)
Physician Discharge Summary  Fernando Anthony LHT:[id number] DOB: 1955-11-25 DOA: 12/03/2018  PCP: Curly Rim, MD  Admit date: 12/03/2018 Discharge date: 12/07/2018  Admitted From: Home  Disposition: Home   Recommendations for Outpatient Follow-up:  1. Follow up with PCP and podiatry Monday 2. Check LFTs in 1 to 2 weeks from PCP. 3. Please follow up on the following pending results:  Home Health:noe Equipment/Devices: none  Discharge Condition: stable CODE STATUS: Full code Diet recommendation: Carb Modified  Brief/Interim Summary: 63 year old male with history of uncontrolled diabetes, hypertension, hyperlipidemia, back pain, history of right great toe amputation been dealing with ulcer and wound on his first metatarsal head of right foot.  Seen by podiatry on 1/6 From where he was sent to the ER, was on antibiotics with improvement.  He again had worsening infection with abscess and discharge from the stump, was seen at podiatry clinic by Dr  Amalia Hailey 12/03/2018 and was sent to the hospital for admission.  He has been taking Levaquin 750 mg daily since 1/6.  Patient was placed on vancomycin cefepime and Flagyl and was admitted.  Assessment & Plan:   Ulceration and cellulitis and osteomyelitis of right foot in the setting on DM : x-ray showing osteomyelitis of the sesamoidal apparatus and the first metatarsal head: Underwent partial first ray amputation by podiatry 1/16.  Spoke with Dr Amalia Hailey was concerned about possible tracking he noted during surgery-I spoke with Dr. Linus Salmons  from infectious disease and okay to discharge on oral Keflex for 5 days as wound culture grew MSSA . Requesting pain meds-we will give 10 doses of percocot and ask him to f/u with his ortho for more meds on Monday if needed.  Uncontrolled diabetes mellitus with diabetic nephropathy, insulin-dependent.  Managed with metformin and Lantus 50 U every morning and 35 U every afternoon.  Last hemoglobin in September more  than 15.  Sugar fairly stable here.  Patient continue home insulin and he will follow-up with endocrinologist early next week.  Hypertensive urgency: From noncompliance.Patient reports not taking medication for 2 to 3 days PTA.  Blood pressure overall improved, resume HCTZ at home, continue home lisinopril and atenolol for now.  Advised to monitor blood pressure and follow-up with PCP to adjust medication.  Abnormal LFTs: lfts has donwtrended?  Etiology.  No alcohol abuse history.  Denies abdominal pain or abdominal tenderness. Right upper quadrant ultrasound unremarkable. Negative viral hepatitis panel.  Hyponatremia: Resolved.  Discharge Diagnoses:  Principal Problem:   Osteomyelitis of right foot ([place]) Active Problems:   Uncontrolled diabetes mellitus with diabetic nephropathy (HCC)   Hypertensive urgency   Abnormal LFTs   Hyponatremia   Osteomyelitis [facility])    Discharge Instructions  Discharge Instructions    Call MD for:  severe uncontrolled pain   Complete by:  As directed    Call MD for:  temperature >100.4   Complete by:  As directed    Diet - low sodium heart healthy   Complete by:  As directed    Discharge wound care:   Complete by:  As directed    Please keep the wound dry clean and intact and follow-up with Dr. Amalia Hailey on Monday   Increase activity slowly   Complete by:  As directed      Allergies as of 12/07/2018      Reactions   Vicodin [hydrocodone-acetaminophen] Itching      Medication List    STOP taking these medications   acetaminophen-codeine 300-30 MG tablet Commonly known  as:  TYLENOL #3   levofloxacin 750 MG tablet Commonly known as:  LEVAQUIN     TAKE these medications   aspirin 81 MG tablet Take 81 mg by mouth at bedtime.   atenolol 25 MG tablet Commonly known as:  TENORMIN Take 25 mg by mouth at bedtime.   cephALEXin 500 MG capsule Commonly known as:  KEFLEX Take 1 capsule (500 mg total) by mouth 4 (four) times daily for 5 days.    cyclobenzaprine 10 MG tablet Commonly known as:  FLEXERIL Take 1 tablet (10 mg total) by mouth at bedtime.   Fish Oil 500 MG Caps Take 500 mg by mouth at bedtime.   gabapentin 100 MG capsule Commonly known as:  NEURONTIN Take 1 capsule (100 mg total) by mouth 3 (three) times daily. What changed:    how much to take  when to take this   hydrochlorothiazide 12.5 MG capsule Commonly known as:  MICROZIDE Take 12.5 mg by mouth daily.   insulin glargine 100 UNIT/ML injection Commonly known as:  LANTUS Inject 0.35 mLs (35 Units total) into the skin 2 (two) times daily. What changed:    how much to take  additional instructions   lisinopril 10 MG tablet Commonly known as:  PRINIVIL,ZESTRIL Take 10 mg by mouth daily.   metFORMIN 500 MG tablet Commonly known as:  GLUCOPHAGE Take 1 tablet (500 mg total) by mouth 2 (two) times daily with a meal. What changed:    how much to take  when to take this   oxyCODONE-acetaminophen 5-325 MG tablet Commonly known as:  PERCOCET Take 1 tablet by mouth every 6 (six) hours as needed for up to 10 doses for severe pain.   traMADol 50 MG tablet Commonly known as:  ULTRAM Take 2 Tablets every 6 hours as needed for pain            Discharge Care Instructions  (From admission, onward)         Start     Ordered   12/07/18 0000  Discharge wound care:    Comments:  Please keep the wound dry clean and intact and follow-up with Dr. Amalia Hailey on Monday   12/07/18 4098         Follow-up Information    Corrington, Kip A, MD Follow up in 1 week(s).   Specialty:  Family Medicine Contact information: [place] [address] [place] [address] [phone]          Allergies  Allergen Reactions  . Vicodin [Hydrocodone-Acetaminophen] Itching    Consultations:  Infectious disease   Podiatrist    Procedures/Studies: Dg Foot Complete Right  Result Date: 12/06/2018 CLINICAL DATA:  Status post partial amputation of the  right 1st metatarsal last night for osteomyelitis. Diabetes. EXAM: RIGHT FOOT COMPLETE - 3+ VIEW COMPARISON:  12/03/2018 and 11/05/2018 elsewhere. FINDINGS: Interval amputation of the distal half of the 1st metatarsal with sharp bone margins. No fracture is seen. There are multiple air/gas locules in the soft tissues at the site of surgery with overlying skin clips. There is also a small curvilinear bone fragment medial to the 2nd metatarsal neck. There is periosteal thickening involving the distal 2nd metatarsal. There is also a previously demonstrated chronic fracture of the base of the 2nd proximal phalanx. IMPRESSION: 1. Interval amputation of the distal half of the 1st metatarsal with multiple air/gas locules in the soft tissues at the site of surgery. 2. Periosteal thickening involving the distal 2nd metatarsal. This could  be due to a healed injury or infection or reactive change adjacent osteomyelitis recently involving the 1st metatarsal. 3. Chronic fracture of the base of the 2nd proximal phalanx. Electronically Signed   By: Claudie Revering M.D.   On: 12/06/2018 20:19   Dg Foot Complete Right  Result Date: 12/03/2018 Please see detailed radiograph report in office note.  US Abdomen Limited Ruq  Result Date: 12/04/2018 CLINICAL DATA:  63 y/o  M; abnormal liver function test. EXAM: ULTRASOUND ABDOMEN LIMITED RIGHT UPPER QUADRANT COMPARISON:  None. FINDINGS: Gallbladder: No gallstones or wall thickening visualized. No sonographic Murphy sign noted by sonographer. Common bile duct: Diameter: 3.7 mm Liver: No focal lesion identified. Within normal limits in parenchymal echogenicity. Portal vein is patent on color Doppler imaging with normal direction of blood flow towards the liver. IMPRESSION: Negative right upper quadrant ultrasound. Electronically Signed   By: Kristine Garbe M.D.   On: 12/04/2018 05:04     Subjective: Seen and examined this morning.  Patient is wanting to leave this  morning refused vancomycin.  Discharge Exam: Vitals:   12/07/18 0205 12/07/18 0615  BP: (!) 168/83 136/89  Pulse: 78 (!) 132  Resp: 16 17  Temp: 99.9 F (37.7 C) 99.9 F (37.7 C)  SpO2: 95% 94%   Vitals:   12/06/18 1423 12/06/18 2035 12/07/18 0205 12/07/18 0615  BP: (!) 147/78 (!) 157/80 (!) 168/83 136/89  Pulse: 68 80 78 (!) 132  Resp: 14 17 16 17   Temp: 98.3 F (36.8 C) 100 F (37.8 C) 99.9 F (37.7 C) 99.9 F (37.7 C)  TempSrc: Oral Oral Oral Oral  SpO2: 97% 95% 95% 94%  Weight:      Height:        General: Pt is alert, awake, not in acute distress Cardiovascular: RRR, S1/S2 +, no rubs, no gallops Respiratory: CTA bilaterally, no wheezing, no rhonchi Abdominal: Soft, NT, ND, bowel sounds + Extremities: no edema, no cyanosis.  Right foot in dressing, intact   The results of significant diagnostics from this hospitalization (including imaging, microbiology, ancillary and laboratory) are listed below for reference.     Microbiology: Recent Results (from the past 240 hour(s))  WOUND CULTURE     Status: Abnormal   Collection Time: 12/03/18  4:19 PM  Result Value Ref Range Status   MICRO NUMBER: [id number]  Final   SPECIMEN QUALITY: Adequate  Final   SOURCE: WOUND  Final   STATUS: FINAL  Final   GRAM STAIN: No organisms or white blood cells seen  Final   ISOLATE 1: Staphylococcus aureus (A)  Final    Comment: Scant growth of Staphylococcus aureus      Susceptibility   Staphylococcus aureus - AEROBIC CULT, GRAM STAIN POSITIVE 1    VANCOMYCIN <=0.5 Sensitive     CIPROFLOXACIN <=0.5 Sensitive     CLINDAMYCIN <=0.25 Sensitive     LEVOFLOXACIN <=0.12 Sensitive     ERYTHROMYCIN <=0.25 Sensitive     GENTAMICIN <=0.5 Sensitive     OXACILLIN* 0.5 Sensitive      * Oxacillin-susceptible staphylococci aresusceptible to other penicillinase-stablepenicillins (e.g. Methicillin, Nafcillin), beta-lactam/beta-lactamase inhibitor combinations, andcephems with staphylococcal  indications, includingCefazolin.    TETRACYCLINE <=1 Sensitive     TRIMETH/SULFA* <=10 Sensitive      * Oxacillin-susceptible staphylococci aresusceptible to other penicillinase-stablepenicillins (e.g. Methicillin, Nafcillin), beta-lactam/beta-lactamase inhibitor combinations, andcephems with staphylococcal indications, includingCefazolin.Legend:S = Susceptible  I = IntermediateR = Resistant  NS = Not susceptible* = Not tested  NR = Not reported**NN =  See antimicrobic comments  Wound or Superficial Culture     Status: None   Collection Time: 12/04/18  4:27 AM  Result Value Ref Range Status   Specimen Description WOUND RIGHT FOOT  Final   Special Requests NONE  Final   Gram Stain   Final    MODERATE WBC PRESENT, PREDOMINANTLY PMN NO ORGANISMS SEEN Performed at [facility], [address] [place], [place] [address]    Culture RARE STAPHYLOCOCCUS AUREUS  Final   Report Status 12/06/2018 FINAL  Final   Organism ID, Bacteria STAPHYLOCOCCUS AUREUS  Final      Susceptibility   Staphylococcus aureus - MIC*    CIPROFLOXACIN <=0.5 SENSITIVE Sensitive     ERYTHROMYCIN <=0.25 SENSITIVE Sensitive     GENTAMICIN <=0.5 SENSITIVE Sensitive     OXACILLIN 0.5 SENSITIVE Sensitive     TETRACYCLINE <=1 SENSITIVE Sensitive     VANCOMYCIN 1 SENSITIVE Sensitive     TRIMETH/SULFA <=10 SENSITIVE Sensitive     CLINDAMYCIN <=0.25 SENSITIVE Sensitive     RIFAMPIN <=0.5 SENSITIVE Sensitive     Inducible Clindamycin NEGATIVE Sensitive     * RARE STAPHYLOCOCCUS AUREUS  Surgical pcr screen     Status: Abnormal   Collection Time: 12/04/18 10:48 PM  Result Value Ref Range Status   MRSA, PCR NEGATIVE NEGATIVE Final   Staphylococcus aureus POSITIVE (A) NEGATIVE Final    Comment: (NOTE) The Xpert SA Assay (FDA approved for NASAL specimens in patients 86 years of age and older), is one component of a comprehensive surveillance program. It is not intended to diagnose infection nor to guide or monitor  treatment.   Aerobic/Anaerobic Culture (surgical/deep wound)     Status: None (Preliminary result)   Collection Time: 12/05/18  8:01 PM  Result Value Ref Range Status   Specimen Description ABSCESS  Final   Special Requests   Final    RT 1ST RAY AMPUTATION AND ABSCESS PT ON LEVAQUIN FLAGYL   Gram Stain   Final    FEW WBC PRESENT,BOTH PMN AND MONONUCLEAR RARE GRAM POSITIVE COCCI    Culture   Final    RARE STAPHYLOCOCCUS AUREUS SUSCEPTIBILITIES TO FOLLOW Performed at [facility], [place] [address], [place], [place] [address]    Report Status PENDING  Incomplete     Labs: BNP (last 3 results) No results for input(s): BNP in the last 8760 hours. Basic Metabolic Panel: Recent Labs  Lab 12/04/18 0421 12/04/18 1632 12/05/18 0441 12/06/18 0253 12/07/18 0629  NA 134* 135 135 134* 133*  K 3.7 4.4 4.4 4.7 4.2  CL 101 102 104 104 103  CO2 28 26 27 25 25   GLUCOSE 122* 319* 302* 350* 182*  BUN 12 11 16 17 17   CREATININE 0.78 0.72 0.75 0.80 0.86  CALCIUM 8.4* 7.9* 7.9* 7.6* 7.9*   Liver Function Tests: Recent Labs  Lab 12/03/18 2232 12/04/18 0421 12/05/18 0441  AST 192* 142* 137*  ALT 245* 215* 159*  ALKPHOS 364* 347* 335*  BILITOT 0.4 0.4 0.3  PROT 6.5 6.1* 5.3*  ALBUMIN 2.0* 2.0* 1.5*   No results for input(s): LIPASE, AMYLASE in the last 168 hours. No results for input(s): AMMONIA in the last 168 hours. CBC: Recent Labs  Lab 12/03/18 2232 12/04/18 0421 12/05/18 0441  WBC 11.2* 11.6* 10.4  NEUTROABS 7.8* 8.0*  --   HGB 11.6* 12.4* 10.5*  HCT 36.6* 38.8* 32.3*  MCV 95.1 94.6 94.4  PLT 315 337 317   Cardiac  Enzymes: No results for input(s): CKTOTAL, CKMB, CKMBINDEX, TROPONINI in the last 168 hours. BNP: Invalid input(s): POCBNP CBG: Recent Labs  Lab 12/06/18 1658 12/06/18 2032 12/06/18 2358 12/07/18 0418 12/07/18 0813  GLUCAP 169* 92 154* 90 167*   D-Dimer No results for input(s): DDIMER in the last 72 hours. Hgb A1c No results for input(s):  HGBA1C in the last 72 hours. Lipid Profile No results for input(s): CHOL, HDL, LDLCALC, TRIG, CHOLHDL, LDLDIRECT in the last 72 hours. Thyroid function studies No results for input(s): TSH, T4TOTAL, T3FREE, THYROIDAB in the last 72 hours.  Invalid input(s): FREET3 Anemia work up No results for input(s): VITAMINB12, FOLATE, FERRITIN, TIBC, IRON, RETICCTPCT in the last 72 hours. Urinalysis No results found for: COLORURINE, APPEARANCEUR, LABSPEC, PHURINE, GLUCOSEU, HGBUR, BILIRUBINUR, KETONESUR, PROTEINUR, UROBILINOGEN, NITRITE, LEUKOCYTESUR Sepsis Labs Invalid input(s): PROCALCITONIN,  WBC,  LACTICIDVEN Microbiology Recent Results (from the past 240 hour(s))  WOUND CULTURE     Status: Abnormal   Collection Time: 12/03/18  4:19 PM  Result Value Ref Range Status   MICRO NUMBER: [id number]  Final   SPECIMEN QUALITY: Adequate  Final   SOURCE: WOUND  Final   STATUS: FINAL  Final   GRAM STAIN: No organisms or white blood cells seen  Final   ISOLATE 1: Staphylococcus aureus (A)  Final    Comment: Scant growth of Staphylococcus aureus      Susceptibility   Staphylococcus aureus - AEROBIC CULT, GRAM STAIN POSITIVE 1    VANCOMYCIN <=0.5 Sensitive     CIPROFLOXACIN <=0.5 Sensitive     CLINDAMYCIN <=0.25 Sensitive     LEVOFLOXACIN <=0.12 Sensitive     ERYTHROMYCIN <=0.25 Sensitive     GENTAMICIN <=0.5 Sensitive     OXACILLIN* 0.5 Sensitive      * Oxacillin-susceptible staphylococci aresusceptible to other penicillinase-stablepenicillins (e.g. Methicillin, Nafcillin), beta-lactam/beta-lactamase inhibitor combinations, andcephems with staphylococcal indications, includingCefazolin.    TETRACYCLINE <=1 Sensitive     TRIMETH/SULFA* <=10 Sensitive      * Oxacillin-susceptible staphylococci aresusceptible to other penicillinase-stablepenicillins (e.g. Methicillin, Nafcillin), beta-lactam/beta-lactamase inhibitor combinations, andcephems with staphylococcal indications, includingCefazolin.Legend:S =  Susceptible  I = IntermediateR = Resistant  NS = Not susceptible* = Not tested  NR = Not reported**NN = See antimicrobic comments  Wound or Superficial Culture     Status: None   Collection Time: 12/04/18  4:27 AM  Result Value Ref Range Status   Specimen Description WOUND RIGHT FOOT  Final   Special Requests NONE  Final   Gram Stain   Final    MODERATE WBC PRESENT, PREDOMINANTLY PMN NO ORGANISMS SEEN Performed at [facility], [place] [address] [place], [place] [address]    Culture RARE STAPHYLOCOCCUS AUREUS  Final   Report Status 12/06/2018 FINAL  Final   Organism ID, Bacteria STAPHYLOCOCCUS AUREUS  Final      Susceptibility   Staphylococcus aureus - MIC*    CIPROFLOXACIN <=0.5 SENSITIVE Sensitive     ERYTHROMYCIN <=0.25 SENSITIVE Sensitive     GENTAMICIN <=0.5 SENSITIVE Sensitive     OXACILLIN 0.5 SENSITIVE Sensitive     TETRACYCLINE <=1 SENSITIVE Sensitive     VANCOMYCIN 1 SENSITIVE Sensitive     TRIMETH/SULFA <=10 SENSITIVE Sensitive     CLINDAMYCIN <=0.25 SENSITIVE Sensitive     RIFAMPIN <=0.5 SENSITIVE Sensitive     Inducible Clindamycin NEGATIVE Sensitive     * RARE STAPHYLOCOCCUS AUREUS  Surgical pcr screen     Status: Abnormal   Collection Time: 12/04/18 10:48 PM  Result Value Ref Range Status   MRSA, PCR NEGATIVE NEGATIVE Final   Staphylococcus aureus POSITIVE (A) NEGATIVE Final    Comment: (NOTE) The Xpert SA Assay (FDA approved for NASAL specimens in patients 69 years of age and older), is one component of a comprehensive surveillance program. It is not intended to diagnose infection nor to guide or monitor treatment.   Aerobic/Anaerobic Culture (surgical/deep wound)     Status: None (Preliminary result)   Collection Time: 12/05/18  8:01 PM  Result Value Ref Range Status   Specimen Description ABSCESS  Final   Special Requests   Final    RT 1ST RAY AMPUTATION AND ABSCESS PT ON LEVAQUIN FLAGYL   Gram Stain   Final    FEW WBC PRESENT,BOTH PMN AND  MONONUCLEAR RARE GRAM POSITIVE COCCI    Culture   Final    RARE STAPHYLOCOCCUS AUREUS SUSCEPTIBILITIES TO FOLLOW Performed at [facility], [place] [address] [place], [place] [address]    Report Status PENDING  Incomplete   Time coordinating discharge: 35 minutes  SIGNED:   Antonieta Pert, MD  [facility] 12/07/2018, 9:34 AM Pager [phone]  If 7PM-7AM, please contact night-coverage [email or web address] Password [email or web address]  Please Note: This patient record was dictated using Editor, commissioning. Chart creation errors have been sought, but may not always have been located. Such creation errors do not reflect on the Standard of Medical Care.

## 2018-12-08 ENCOUNTER — Other Ambulatory Visit: Payer: Self-pay | Admitting: Podiatry

## 2018-12-08 MED ORDER — CLINDAMYCIN HCL 300 MG PO CAPS: 300.0000 mg | ORAL_CAPSULE | Freq: Three times a day (TID) | ORAL | 0 refills | Status: DC

## 2018-12-08 NOTE — Progress Notes (Signed)
Patient called stating that he started to take the keflex this morning and he started to notice his hands swelling. He denies any chest pain, shortness of breath or wheezing. I advised him to stop the antibiotic. I sent clindamycin to his pharmacy. Discussed to go to the ER or call 911 if symptoms worsen.  Also he states that he was having some bleeding on the bandage when he puts weight on his foot and his wife has been changing the bandage.  He states he not continuously bleeding but only when he puts weight and walks.  Advised him to limit weightbearing and elevation.  If any worsening to let us know otherwise has an appointment with Dr. Amalia Hailey on Monday.  Trula Slade

## 2018-12-10 ENCOUNTER — Ambulatory Visit (INDEPENDENT_AMBULATORY_CARE_PROVIDER_SITE_OTHER): Payer: BLUE CROSS/BLUE SHIELD | Admitting: Podiatry

## 2018-12-10 VITALS — BP 160/90

## 2018-12-10 DIAGNOSIS — E0843 Diabetes mellitus due to underlying condition with diabetic autonomic (poly)neuropathy: Secondary | ICD-10-CM

## 2018-12-10 DIAGNOSIS — L03115 Cellulitis of right lower limb: Secondary | ICD-10-CM

## 2018-12-10 DIAGNOSIS — L97512 Non-pressure chronic ulcer of other part of right foot with fat layer exposed: Secondary | ICD-10-CM

## 2018-12-10 DIAGNOSIS — Z9889 Other specified postprocedural states: Secondary | ICD-10-CM

## 2018-12-10 DIAGNOSIS — I70235 Atherosclerosis of native arteries of right leg with ulceration of other part of foot: Secondary | ICD-10-CM

## 2018-12-10 NOTE — Progress Notes (Signed)
   Subjective:  Patient presents today status post partial first ray amputation secondary diabetes mellitus right foot. DOS: 12/05/2018.  Performed inpatient at [facility].  Patient discharged 12/07/2018, Friday, on oral cephalexin.  Patient noticed swelling throughout his body and antibiotic was changed to clindamycin 300 mg 3 times daily.  Patient continued to have swelling throughout his whole body and extremities upper and lower.  Patient says that his foot has improved significantly regarding pain and it feels fine.  Past Medical History:  Diagnosis Date  . Back pain   . Diabetes mellitus   . Headache(784.0)    general  . Hyperlipidemia   . Hypertension   . Osteomyelitis ([place]) 11/2018   RIGHT FOOT      Objective/Physical Exam Neurovascular status intact.  Skin incisions appear to be coapted with sutures and staples intact.  Erythema appears to be significantly improved.  Packing is intact for plantar ulceration site.  There is a significant amount of diffuse edema throughout the patient's body extending proximal and lower extremities up to the level of the groin.  Also the hands and arms.  Assessment: 1. s/p partial ray amputation right foot. DOS: 12/05/2018   Plan of Care:  1. Patient was evaluated.  2.  Today dressings were changed.  Keep clean dry and intact.  Patient can be weightbearing as tolerated. 3.  Continue oral clindamycin as prescribed 4.  Orders placed today for home health dressing changes 5.  Return to clinic on Wednesday, 12/12/2018, on nurse schedule for dressing change.  Return to the office in 1 week on my schedule so I can reevaluate   Edrick Kins, DPM [facility]  Dr. Edrick Kins, [place]                                        [place],  [id number]                Office [phone]  Fax [phone]

## 2018-12-11 ENCOUNTER — Telehealth: Payer: Self-pay | Admitting: *Deleted

## 2018-12-11 ENCOUNTER — Telehealth: Payer: Self-pay | Admitting: Podiatry

## 2018-12-11 LAB — AEROBIC/ANAEROBIC CULTURE W GRAM STAIN (SURGICAL/DEEP WOUND)

## 2018-12-11 LAB — AEROBIC/ANAEROBIC CULTURE (SURGICAL/DEEP WOUND)

## 2018-12-11 NOTE — Telephone Encounter (Signed)
It's just a complicated wound that requires deep packing with iodoform gauze. I may just have him come in 2x/week on jessica's nurse schedule.

## 2018-12-11 NOTE — Telephone Encounter (Signed)
Required form, clinicals and demographics to [facility].

## 2018-12-11 NOTE — Telephone Encounter (Signed)
[name] states he is unable to accept [employer] at this time.

## 2018-12-11 NOTE — Telephone Encounter (Signed)
Pt states he is not established with a [facility] agency. I told pt I would get him set up with someone today.

## 2018-12-11 NOTE — Telephone Encounter (Signed)
Melissa with [facility] called saying they are unable to staff this patient from the referral that was sent to them.

## 2018-12-11 NOTE — Telephone Encounter (Signed)
Fernando Anthony states they are unable to take pt at this time.

## 2018-12-11 NOTE — Telephone Encounter (Signed)
Faxed required form, orders, Demographics and clinicals to [facility].

## 2018-12-11 NOTE — Telephone Encounter (Signed)
-----   Message from Edrick Kins, DPM sent at 12/10/2018  6:48 PM EST ----- Regarding: Home health dressing changes Can we please order home health dressing changes right foot.  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  -Cleanse with saline.  Dry. -Pack plantar ulcer with 1/2" iodoform.  -Paint incision site and plantar ulcer with betadine.  -Dress with 4x4 gauze, 4" kling, 4" ace.   Every other day x 6 weeks.   Thanks, Dr. Amalia Hailey

## 2018-12-12 ENCOUNTER — Ambulatory Visit (INDEPENDENT_AMBULATORY_CARE_PROVIDER_SITE_OTHER): Payer: BLUE CROSS/BLUE SHIELD | Admitting: Podiatry

## 2018-12-12 DIAGNOSIS — Z9889 Other specified postprocedural states: Secondary | ICD-10-CM

## 2018-12-12 DIAGNOSIS — L97512 Non-pressure chronic ulcer of other part of right foot with fat layer exposed: Secondary | ICD-10-CM

## 2018-12-12 DIAGNOSIS — I70235 Atherosclerosis of native arteries of right leg with ulceration of other part of foot: Secondary | ICD-10-CM

## 2018-12-12 DIAGNOSIS — E0843 Diabetes mellitus due to underlying condition with diabetic autonomic (poly)neuropathy: Secondary | ICD-10-CM

## 2018-12-12 NOTE — Telephone Encounter (Signed)
Fernando Anthony - [facility] they are not allowed to add additional wound care pts at this time.

## 2018-12-12 NOTE — Telephone Encounter (Signed)
Left message on pt and his wife's phones explaining there were no HHC agencies that accepted his insurance or was accepting wound care at this time. Dr. Amalia Hailey requested he make an appt to be seen by the wound care nurse Thursday or Friday of this week and then twice a week until evaluated by Dr. Amalia Hailey.

## 2018-12-13 DIAGNOSIS — E1159 Type 2 diabetes mellitus with other circulatory complications: Secondary | ICD-10-CM | POA: Insufficient documentation

## 2018-12-13 DIAGNOSIS — E1169 Type 2 diabetes mellitus with other specified complication: Secondary | ICD-10-CM | POA: Insufficient documentation

## 2018-12-13 DIAGNOSIS — E785 Hyperlipidemia, unspecified: Secondary | ICD-10-CM | POA: Insufficient documentation

## 2018-12-13 DIAGNOSIS — Z89431 Acquired absence of right foot: Secondary | ICD-10-CM | POA: Insufficient documentation

## 2018-12-14 ENCOUNTER — Ambulatory Visit (INDEPENDENT_AMBULATORY_CARE_PROVIDER_SITE_OTHER): Payer: Self-pay

## 2018-12-14 DIAGNOSIS — L97512 Non-pressure chronic ulcer of other part of right foot with fat layer exposed: Secondary | ICD-10-CM

## 2018-12-17 ENCOUNTER — Ambulatory Visit (INDEPENDENT_AMBULATORY_CARE_PROVIDER_SITE_OTHER): Payer: BLUE CROSS/BLUE SHIELD | Admitting: Podiatry

## 2018-12-17 ENCOUNTER — Encounter: Payer: Self-pay | Admitting: Podiatry

## 2018-12-17 DIAGNOSIS — I70235 Atherosclerosis of native arteries of right leg with ulceration of other part of foot: Secondary | ICD-10-CM

## 2018-12-17 DIAGNOSIS — L97512 Non-pressure chronic ulcer of other part of right foot with fat layer exposed: Secondary | ICD-10-CM | POA: Diagnosis not present

## 2018-12-17 DIAGNOSIS — L03115 Cellulitis of right lower limb: Secondary | ICD-10-CM

## 2018-12-17 DIAGNOSIS — E0843 Diabetes mellitus due to underlying condition with diabetic autonomic (poly)neuropathy: Secondary | ICD-10-CM

## 2018-12-17 MED ORDER — CLINDAMYCIN HCL 300 MG PO CAPS: 300.0000 mg | ORAL_CAPSULE | Freq: Three times a day (TID) | ORAL | 1 refills | Status: DC

## 2018-12-17 NOTE — Progress Notes (Signed)
   Subjective:  Patient presents today status post partial first ray amputation secondary to osteomyelitis right foot.. DOS: 12/05/2018.  Patient states that he has noticed significant improvement.  The redness and swelling has improved significantly.  He was recently seen by his primary care physician for the bilateral lower extremity edema and is placed on Lasix.  He notices significant improvement.  He also denies any significant pain.  Patient has no new complaints at this time.  Past Medical History:  Diagnosis Date  . Back pain   . Diabetes mellitus   . Headache(784.0)    general  . Hyperlipidemia   . Hypertension   . Osteomyelitis ([place]) 11/2018   RIGHT FOOT      Objective/Physical Exam Neurovascular status intact.  There is some dehiscence noted to the distal portion of the amputation stump approximately 2.0 cm in length along the incision site distally. Ulceration noted to the plantar aspect of the foot appears significantly improved.  Foot appears significantly improved since last visit on 12/10/2018.  Ulcer noted to the plantar aspect of the first MTPJ measuring approximately 0.5 x 0.5 x 2.5 cm in depth.  The plantar ulceration does tract to the dorsal dehiscence site.  Wound base appears to be mostly granular with periwound integrity intact.  Overall the wound appearance is significantly improved since last visit  Assessment: 1. s/p partial first ray amputation right foot. DOS: 12/05/2018   Plan of Care:  1. Patient was evaluated. 2.  The patient's insurance denied home health dressing changes.  Today instructions were provided and supplies were given to do his own dressing changes.  Dressing changes will consist of iodoform packing with Betadine and dry sterile dressing periwound. 3.  Medically necessary excisional debridement including muscle and deep fascial tissue was performed using a tissue nipper.  Excisional debridement of all the necrotic nonviable tissue down to healthy  bleeding viable tissue was performed with post debridement measurement same as pre- 4.  Refill prescription for clindamycin 300 mg 3 times daily 5.  Return to clinic in 2 weeks   Edrick Kins, DPM [facility]  Dr. Edrick Kins, [address]                                        [place], [place] [address]                Office [phone]  Fax [phone]

## 2018-12-17 NOTE — Progress Notes (Signed)
Patient is here today for dressing change to his right foot.  Procedure performed was partial first ray amputation, date of surgery 12/05/2018.  He states that his pain has improved, and he feels pretty good at this time.  Vital signs are stable.  Remove soiled dressing, noted edema to the foot, but there is no erythema, no redness.  No purulent drainage no foul odor.  Iodoform was packed into the plantar ulceration site, dry sterile dressing was applied to the foot along with compressive bandage.  Discussed signs and symptoms of infection, patient is to remain in his surgical shoe, and follow-up with Dr. Amalia Hailey at his regularly scheduled visit.

## 2018-12-30 NOTE — Progress Notes (Signed)
   Subjective:  Patient presents today status post partial first ray amputation secondary to osteomyelitis with debridement of ulcer to the right foot.. DOS: 12/05/2018.  Patient presents today approximately 1 week postop for dressing change.  Patient says that the pain is tolerable at the moment.  He has been minimal weightbearing in the postoperative shoe.  Patient does have a complaint of some lower extremity edema noted to the bilateral lower extremities.  He is not sure if this is related to the antibiotics or any medications that he is been taking recently due to surgery.  He presents for further treatment and evaluation  Past Medical History:  Diagnosis Date  . Back pain   . Diabetes mellitus   . Headache(784.0)    general  . Hyperlipidemia   . Hypertension   . Osteomyelitis ([place]) 11/2018   RIGHT FOOT      Objective/Physical Exam Neurovascular status intact.  Skin incisions appear to be well coapted with sutures and staples intact.  Cellulitis appears to be improved significantly.  Minimal drainage noted.  Iodoform gauze packing is intact within the plantar aspect of the ulceration.  No malodor noted.  There is some noted pitting edema to the bilateral lower extremities and legs.  Negative for any calf tenderness.  Radiographic Exam:  Osteotomies sites appear to be stable with routine healing and no signs of ostial lysis or osteomyelitis.  Assessment: 1. s/p partial first ray amputation right foot. DOS: 12/05/2018   Plan of Care:  1. Patient was evaluated. X-rays reviewed 2.  Today dressings were changed.  Keep clean dry and intact x1 week 3.  Recommend follow-up with PCP for evaluation of lower extremity edema and water retention.  I do not suspect that this would be due to any antibiotics or medications he is taking postoperatively since he has been on antibiotics preoperatively and before symptoms arose. 4.  Continue weightbearing in postoperative shoe 5.  Return to clinic in  1 week   Edrick Kins, DPM [facility]  Dr. Edrick Kins, [address]                                        [place], [place] [address]                Office [phone]  Fax [phone]

## 2018-12-31 ENCOUNTER — Ambulatory Visit (INDEPENDENT_AMBULATORY_CARE_PROVIDER_SITE_OTHER): Payer: BLUE CROSS/BLUE SHIELD | Admitting: Podiatry

## 2018-12-31 ENCOUNTER — Encounter: Payer: Self-pay | Admitting: Podiatry

## 2018-12-31 DIAGNOSIS — E0843 Diabetes mellitus due to underlying condition with diabetic autonomic (poly)neuropathy: Secondary | ICD-10-CM

## 2018-12-31 DIAGNOSIS — I70235 Atherosclerosis of native arteries of right leg with ulceration of other part of foot: Secondary | ICD-10-CM

## 2018-12-31 DIAGNOSIS — L97512 Non-pressure chronic ulcer of other part of right foot with fat layer exposed: Secondary | ICD-10-CM

## 2019-01-04 NOTE — Progress Notes (Signed)
   Subjective:  Patient presents today status post partial first ray amputation secondary to osteomyelitis right foot.. DOS: 12/05/2018. He states he is doing well. He has been packing the wound as directed and is unsure if he should continue to do so. There are no modifying factors noted. Patient is here for further evaluation and treatment.   Past Medical History:  Diagnosis Date  . Back pain   . Diabetes mellitus   . Headache(784.0)    general  . Hyperlipidemia   . Hypertension   . Osteomyelitis ([place]) 11/2018   RIGHT FOOT      Objective/Physical Exam Neurovascular status intact.  There is some dehiscence noted to the distal portion of the amputation stump approximately 1.2 cm in length along the incision site distally. Ulceration noted to the plantar aspect of the foot appears significantly improved.  Foot appears significantly improved since last visit on 12/10/2018.  Ulcer noted to the plantar aspect of the first MTPJ measuring approximately 0.5 x 0.5 x 2.5 cm in depth.  The plantar ulceration does tract to the dorsal dehiscence site.  Wound base appears to be mostly granular with periwound integrity intact.  Overall the wound appearance is significantly improved since last visit  Assessment: 1. s/p partial first ray amputation right foot. DOS: 12/05/2018 2. Wound dehiscence noted    Plan of Care:  1. Patient was evaluated. 2. Recommended collagen daily with a dry sterile dressing.  3. Appointment with Liliane Channel, Pedorthist for DM insoles with toe filler.  4. Continue using post op shoe.  5. Return to clinic in 4 weeks.   Edrick Kins, DPM [facility]  Dr. Edrick Kins,    [address]                                        [place], [place] [address]                Office [phone]  Fax [phone]

## 2019-01-09 ENCOUNTER — Telehealth: Payer: Self-pay | Admitting: Podiatry

## 2019-01-09 ENCOUNTER — Ambulatory Visit: Payer: BLUE CROSS/BLUE SHIELD | Admitting: Podiatry

## 2019-01-09 NOTE — Telephone Encounter (Signed)
Pt was schedule for appt today 01/09/19 to have the area checked on his 2nd toe but his wife called back stating that the patient is sick with what they believe is the flu. They are going to keep an eye on the area and will call if there are any issues.

## 2019-01-09 NOTE — Telephone Encounter (Signed)
I called pt's wife, Con Memos she states the area under the 2nd toe, has what looks like a blister with blood beneath. I offered Con Memos an appt stating pt needed to be seen and may be able to pick up the product Dr. Amalia Hailey had mentioned and be treated as well.

## 2019-01-09 NOTE — Telephone Encounter (Signed)
My husband had surgery on 15 January. He now has a painful callused/blister, and his big toe was completley removed, so this is under his second toe. I know Dr. Amalia Hailey had mentioned something about a prosthetic shoe or something like that. If you could please call me back.

## 2019-01-18 DIAGNOSIS — R413 Other amnesia: Secondary | ICD-10-CM | POA: Insufficient documentation

## 2019-01-18 DIAGNOSIS — F5104 Psychophysiologic insomnia: Secondary | ICD-10-CM | POA: Insufficient documentation

## 2019-02-06 ENCOUNTER — Telehealth: Payer: Self-pay | Admitting: *Deleted

## 2019-02-06 ENCOUNTER — Ambulatory Visit (INDEPENDENT_AMBULATORY_CARE_PROVIDER_SITE_OTHER): Payer: BLUE CROSS/BLUE SHIELD

## 2019-02-06 ENCOUNTER — Other Ambulatory Visit: Payer: Self-pay | Admitting: Podiatry

## 2019-02-06 ENCOUNTER — Other Ambulatory Visit: Payer: Self-pay

## 2019-02-06 ENCOUNTER — Ambulatory Visit (INDEPENDENT_AMBULATORY_CARE_PROVIDER_SITE_OTHER): Payer: BLUE CROSS/BLUE SHIELD | Admitting: Podiatry

## 2019-02-06 DIAGNOSIS — E08621 Diabetes mellitus due to underlying condition with foot ulcer: Secondary | ICD-10-CM | POA: Diagnosis not present

## 2019-02-06 DIAGNOSIS — L97511 Non-pressure chronic ulcer of other part of right foot limited to breakdown of skin: Secondary | ICD-10-CM | POA: Diagnosis not present

## 2019-02-06 DIAGNOSIS — L03115 Cellulitis of right lower limb: Secondary | ICD-10-CM

## 2019-02-06 DIAGNOSIS — L97512 Non-pressure chronic ulcer of other part of right foot with fat layer exposed: Secondary | ICD-10-CM | POA: Diagnosis not present

## 2019-02-06 DIAGNOSIS — M86071 Acute hematogenous osteomyelitis, right ankle and foot: Secondary | ICD-10-CM

## 2019-02-06 DIAGNOSIS — I70235 Atherosclerosis of native arteries of right leg with ulceration of other part of foot: Secondary | ICD-10-CM

## 2019-02-06 MED ORDER — SULFAMETHOXAZOLE-TRIMETHOPRIM 800-160 MG PO TABS: 1.0000 | ORAL_TABLET | Freq: Two times a day (BID) | ORAL | 1 refills | Status: DC

## 2019-02-06 NOTE — Telephone Encounter (Signed)
-----   Message from Edrick Kins, DPM sent at 02/06/2019 11:02 AM EDT ----- Regarding: Referral to infectious disease We please refer patient for infectious disease.  Patient will need 6 weeks IV antibiotics.   Diagnosis: Osteomyelitis right foot.  Second MPJ.  Diabetes mellitus.   Thanks, Dr. Amalia Hailey

## 2019-02-06 NOTE — Telephone Encounter (Signed)
Faxed required form with notice as urgent and pt needs 6 weeks IV antibiotics, clinicals and demographics to Infectious Disease.

## 2019-02-07 ENCOUNTER — Telehealth: Payer: Self-pay | Admitting: Podiatry

## 2019-02-07 NOTE — Telephone Encounter (Signed)
Please advise 

## 2019-02-07 NOTE — Telephone Encounter (Signed)
Pt wife called and stated Dr. Amalia Hailey wanted her husband to go over to the [facility] because he has an infection in his bones but they can't get him in for two weeks. And she wanted to know if Dr. Amalia Hailey wanted to go ahead and start him on an anti-biotic. Please call patient

## 2019-02-07 NOTE — Telephone Encounter (Signed)
I informed pt's wife, Con Memos an antibiotic had been sent to [facility]. Con Memos states [facility] could not see pt for IV antibiotics until end of March. I told Con Memos pt had been referred to Infectious Disease. Con Memos stated she was told by Infectious Disease staff, Dr. Amalia Hailey could contact the Doctor-on-Call for a consultation for possible earlier referral. I told Con Memos I would contact Dr. Amalia Hailey with the information.

## 2019-02-08 NOTE — Telephone Encounter (Signed)
Nope, nothing mentioned about a doctor on call. Just mentioned I would try to get them in sooner rather than later. Patient has acute cellulitis and osteomyelitis.  Thanks, Dr. Amalia Hailey

## 2019-02-11 ENCOUNTER — Ambulatory Visit: Payer: BLUE CROSS/BLUE SHIELD | Admitting: Orthotics

## 2019-02-11 ENCOUNTER — Other Ambulatory Visit: Payer: Self-pay

## 2019-02-11 ENCOUNTER — Telehealth: Payer: Self-pay | Admitting: Podiatry

## 2019-02-11 DIAGNOSIS — E08621 Diabetes mellitus due to underlying condition with foot ulcer: Secondary | ICD-10-CM

## 2019-02-11 DIAGNOSIS — L97511 Non-pressure chronic ulcer of other part of right foot limited to breakdown of skin: Secondary | ICD-10-CM

## 2019-02-11 DIAGNOSIS — L97512 Non-pressure chronic ulcer of other part of right foot with fat layer exposed: Secondary | ICD-10-CM

## 2019-02-11 NOTE — Telephone Encounter (Signed)
Spoke to the nurse last week because Dr. Amalia Hailey wanted to get my husband into infectious disease ASAP to get started on an antibiotic. Currently, their first available is 03/31. I have not heard back from the nurse to see if Dr. Amalia Hailey was able to call and do a consult with a doctor on call there. Do I need to keep this appointment for him? Please call me on my office number.

## 2019-02-11 NOTE — Progress Notes (Signed)
Patient came into today to be cast for [employer]. Upon recommendation of Dr. Amalia Hailey Patient presents with hx of amputation (hallux); as well as foot ulcer 3 left. Goals are offload ulcer and protect foot from further breakedown Plan vendor Richie

## 2019-02-12 NOTE — Progress Notes (Signed)
   HPI: 63 year old male T2DM -uncontrolled presents status post partial first ray amputation right foot. DOS: 12/05/2018.  Patient presents for evaluation of a new ulceration is developed to the plantar aspect of the right foot underlying the second MTPJ.  Patient states this began approximately 2-3 days ago.  He had developed a callus under the second toe and now is experiencing some dull pain.  Aggravated by walking.  He presents for further treatment evaluation  Past Medical History:  Diagnosis Date  . Back pain   . Diabetes mellitus   . Headache(784.0)    general  . Hyperlipidemia   . Hypertension   . Osteomyelitis ([place]) 11/2018   RIGHT FOOT     Physical Exam: General: The patient is alert and oriented x3 in no acute distress.  Dermatology: Skin is warm, dry and supple bilateral lower extremities.  New ulceration noted to the plantar aspect of the second MTPJ right foot measuring approximately 1.0 x 1.0 x 0.4 cm.  Moderate maceration noted.  Wound base is red with sanguinous drainage.  I am unable to probe the ulceration to the level of the bone.  Periwound integrity is erythematous.  Vascular: Palpable pedal pulses bilaterally.  There is some erythema and edema noted to the right forefoot consistent with cellulitis. Capillary refill within normal limits.  Neurological: Epicritic and protective threshold diminished bilaterally.   Musculoskeletal Exam: Status post partial first ray amputation right foot  Radiographic Exam:  Cortical erosion and degeneration noted to the second MTPJ right foot.  Concerning for osteomyelitis osteotomy of the first metatarsal appears stable with routine healing.  Assessment: 1.  Osteomyelitis second MTPJ right foot 2.  Ulcer right foot secondary to diabetes mellitus 3.  T2DM -uncontrolled 4.  H/o partial first ray amputation right foot. DOS: 12/05/2018   Plan of Care:  1. Patient evaluated. X-Rays reviewed.  2.  Today explained the patient that  he does have bone infection and rather than undergo additional amputation, which would likely be TMA, I would like to pursue long-term IV antibiotics.  Patient agrees. 3.  Today we can refer the patient for infectious disease.  Recommended long-term IV antibiotics with placement of a PICC line, however recommendations from ID would be greatly appreciated. 4.  Medically necessary excisional debridement including subcutaneous tissue was performed using a tissue nipper.  Excisional debridement of all the necrotic nonviable tissue down to healthy bleeding viable tissue was performed with post remeasurement same as pre- 5.  Recommend Betadine dry sterile dressing daily 6.  Prescription for Bactrim DS 7.  Today we will arrange an appointment with Liliane Channel for diabetic shoes and diabetic insoles 8.  Return to clinic in 2 weeks      Edrick Kins, DPM [facility]  Dr. Edrick Kins, DPM    [address], [place] [address]                Office [phone]  Fax [phone]

## 2019-02-19 ENCOUNTER — Telehealth: Payer: Self-pay

## 2019-02-19 ENCOUNTER — Other Ambulatory Visit: Payer: Self-pay | Admitting: Physician Assistant

## 2019-02-19 ENCOUNTER — Other Ambulatory Visit: Payer: BLUE CROSS/BLUE SHIELD | Admitting: Orthotics

## 2019-02-19 ENCOUNTER — Encounter: Payer: Self-pay | Admitting: Internal Medicine

## 2019-02-19 ENCOUNTER — Ambulatory Visit (INDEPENDENT_AMBULATORY_CARE_PROVIDER_SITE_OTHER): Payer: BLUE CROSS/BLUE SHIELD | Admitting: Internal Medicine

## 2019-02-19 ENCOUNTER — Other Ambulatory Visit: Payer: Self-pay

## 2019-02-19 VITALS — BP 133/77 | HR 72 | Temp 97.8°F | Wt 174.0 lb

## 2019-02-19 DIAGNOSIS — M869 Osteomyelitis, unspecified: Secondary | ICD-10-CM

## 2019-02-19 NOTE — Progress Notes (Signed)
[facility] for Infectious Disease      Reason for Consult: osteomyelitis    Referring Physician: Dr. Amalia Hailey    Patient ID: Fernando Anthony, male    DOB: 1956-09-11, 63 y.o.   MRN: [id number]  HPI:   Here for evaluation of right 2nd MTP osteomyelitis.   He had osteomyelitis of his 1st toe in January and since developed an ulcer over the 2nd toe and Xray done last week reviewed by me and enhancement noted, concerning for osteomyelitis.  Unfortunately, he has very poorly controlled DM with his last Hgb A1c of over 12 and he smokes cigarettes. He was sent here for consideration of treatment.  No rash, no significant pain in the toe.  He has had cultures from his other sites and positive for MSSA.  No culture of the current infected bone.    Past Medical History:  Diagnosis Date  . Back pain   . Diabetes mellitus   . Headache(784.0)    general  . Hyperlipidemia   . Hypertension   . Osteomyelitis ([place]) 11/2018   RIGHT FOOT    Prior to Admission medications   Medication Sig Start Date End Date Taking? Authorizing Provider  aspirin 81 MG tablet Take 81 mg by mouth at bedtime.    Yes [provider]  atenolol (TENORMIN) 25 MG tablet Take 25 mg by mouth at bedtime.  02/09/18  Yes [provider]  Continuous Blood Gluc Sensor (FREESTYLE LIBRE 14 DAY SENSOR) MISC ONE EACH (1 DEVICE DOSE) BY DOES NOT APPLY ROUTE EVERY 14 (FOURTEEN) DAYS. 01/19/19  Yes [provider]  FIASP FLEXTOUCH 100 UNIT/ML SOPN INJECT 6 UNITS INTO THE SKIN 3 (THREE) TIMES DAILY WITH MEALS. **DRUG NOT COVERED** 01/19/19  Yes [provider]  hydrochlorothiazide (MICROZIDE) 12.5 MG capsule Take 12.5 mg by mouth daily.  08/23/18  Yes [provider]  insulin glargine (LANTUS) 100 UNIT/ML injection Inject 0.35 mLs (35 Units total) into the skin 2 (two) times daily. Patient taking differently: Inject 35-50 Units into the skin 2 (two) times daily. Take 50 units in the morning and 35  units in the evening 06/17/17  Yes Shary Decamp, PA-C  lisinopril (PRINIVIL,ZESTRIL) 10 MG tablet Take 10 mg by mouth daily.    Yes [provider]  metFORMIN (GLUCOPHAGE) 1000 MG tablet  01/03/19  Yes [provider]  Omega-3 Fatty Acids (FISH OIL) 500 MG CAPS Take 500 mg by mouth at bedtime.    Yes [provider]  rosuvastatin (CRESTOR) 20 MG tablet TAKE ONE TABLET (20 MG DOSE) BY MOUTH AT BEDTIME. 01/09/19  Yes [provider]  traMADol (ULTRAM) 50 MG tablet Take 2 Tablets every 6 hours as needed for pain 07/25/14  Yes Danella Sensing L, NP  cloNIDine (CATAPRES) 0.1 MG tablet Take by mouth. 01/31/19   [provider]  cyclobenzaprine (FLEXERIL) 10 MG tablet Take 1 tablet (10 mg total) by mouth at bedtime. Patient not taking: Reported on 02/19/2019 09/16/14   Charlett Blake, MD  gabapentin (NEURONTIN) 100 MG capsule Take 1 capsule (100 mg total) by mouth 3 (three) times daily. Patient not taking: Reported on 02/19/2019 09/16/14   Charlett Blake, MD  oxyCODONE-acetaminophen (PERCOCET) 5-325 MG tablet Take 1 tablet by mouth every 6 (six) hours as needed for up to 10 doses for severe pain. Patient not taking: Reported on 02/19/2019 12/07/18   Antonieta Pert, MD  sulfamethoxazole-trimethoprim (BACTRIM DS,SEPTRA DS) 800-160 MG tablet Take 1 tablet by  mouth 2 (two) times daily. Patient not taking: Reported on 02/19/2019 02/06/19   Edrick Kins, DPM  traZODone (DESYREL) 100 MG tablet Take by mouth. 01/18/19 02/17/19  [provider]    Allergies  Allergen Reactions  . Vicodin [Hydrocodone-Acetaminophen] Itching    Social History   Tobacco Use  . Smoking status: Current Every Day Smoker    Packs/day: 1.00    Years: 40.00    Pack years: 40.00    Types: Cigarettes  . Smokeless tobacco: Never Used  Substance Use Topics  . Alcohol use: Not Currently  . Drug use: Not Currently    Comment: past use of marijuana    Family History  Problem  Relation Age of Onset  . Cancer Mother   . Heart disease Father     Review of Systems  Constitutional: negative for fevers and chills Musculoskeletal: negative for myalgias and arthralgias All other systems reviewed and are negative    Constitutional: in no apparent distress  Vitals:   02/19/19 1519  BP: 133/77  Pulse: 72  Temp: 97.8 F (36.6 C)   EYES: anicteric ENMT: no thrush Musculoskeletal: no pedal edema noted; right foot with amputation site closed; small open area over 2nd MTP, no swelling, no erythema.  Open ulcer noted, stage 1-2.   Skin: negatives: no rash Neuro: non-focal Psych: normal affect CV: diminished right DP pulse  Labs: Lab Results  Component Value Date   WBC 10.4 12/05/2018   HGB 10.5 (L) 12/05/2018   HCT 32.3 (L) 12/05/2018   MCV 94.4 12/05/2018   PLT 317 12/05/2018    Lab Results  Component Value Date   CREATININE 0.86 12/07/2018   BUN 17 12/07/2018   NA 133 (L) 12/07/2018   K 4.2 12/07/2018   CL 103 12/07/2018   CO2 25 12/07/2018    Lab Results  Component Value Date   ALT 159 (H) 12/05/2018   AST 137 (H) 12/05/2018   ALKPHOS 335 (H) 12/05/2018   BILITOT 0.3 12/05/2018   INR 0.99 09/18/2013     Assessment: probable osteomyelitis.  I agree with assessment of xray.  I had a frank discussion with the patient that with very poor sugar control and active smoking, his chance of successful cure of infection are low without very significant lifestyle changes.  This is in addition to the COVID risk, which severe disease is higher for him.  I also ddisucced using Bactrim + rifampin but there are signfiicant drug-drug interactions with the rifampin. I do think amputation should be a strong consideration and he will continue to discuss this with Dr. Amalia Hailey.  At this time though, I will arrange for the picc line and to start cefazolin 2 grams three times per day for 6 weeks, unless he decides to pursue amputation. He should have a repeat xray in about 2  weeks after starting the antibiotics.    Plan: 1) picc line by IR 2) home health for cefazolin Evisit in 2-3 weeks.   Thanks for consultation

## 2019-02-19 NOTE — Telephone Encounter (Signed)
Called IR at [facility] to schedule appointment to have PICC line inserted per Dr.Comer.  [place] is scheduled to have PICC line placed on 02/20/2019 at 1pm. 1st dose to be done at [place] at [facility] on 04/02/2020at 11am.  Faxed orders to IR, Patient care center, and [facility] on 02/19/2019.    Patient is aware of appointments with IR and Patient care center. Ike and spouse are aware that [facility] will do home care.   Will call [facility].     Lenore Cordia, [place]

## 2019-02-19 NOTE — Telephone Encounter (Signed)
Called [facility], Spoke to [facility] to make aware of orders with PICC appointment information, is aware [name] is having 1st dose administered at Patient care center at [facility].  Mary confirmed understanding with feedback.    Lenore Cordia, [place]

## 2019-02-20 ENCOUNTER — Telehealth: Payer: Self-pay | Admitting: Behavioral Health

## 2019-02-20 ENCOUNTER — Ambulatory Visit: Payer: BLUE CROSS/BLUE SHIELD | Admitting: Podiatry

## 2019-02-20 ENCOUNTER — Ambulatory Visit (HOSPITAL_COMMUNITY): Payer: BLUE CROSS/BLUE SHIELD

## 2019-02-20 ENCOUNTER — Encounter: Payer: Self-pay | Admitting: Podiatry

## 2019-02-20 ENCOUNTER — Ambulatory Visit (INDEPENDENT_AMBULATORY_CARE_PROVIDER_SITE_OTHER): Payer: BLUE CROSS/BLUE SHIELD | Admitting: Podiatry

## 2019-02-20 VITALS — Temp 98.1°F

## 2019-02-20 DIAGNOSIS — L97511 Non-pressure chronic ulcer of other part of right foot limited to breakdown of skin: Secondary | ICD-10-CM

## 2019-02-20 DIAGNOSIS — L97512 Non-pressure chronic ulcer of other part of right foot with fat layer exposed: Secondary | ICD-10-CM

## 2019-02-20 DIAGNOSIS — E08621 Diabetes mellitus due to underlying condition with foot ulcer: Secondary | ICD-10-CM | POA: Diagnosis not present

## 2019-02-20 DIAGNOSIS — M86071 Acute hematogenous osteomyelitis, right ankle and foot: Secondary | ICD-10-CM | POA: Diagnosis not present

## 2019-02-20 MED ORDER — SULFAMETHOXAZOLE-TRIMETHOPRIM 800-160 MG PO TABS: 1.0000 | ORAL_TABLET | Freq: Two times a day (BID) | ORAL | 1 refills | Status: DC

## 2019-02-20 NOTE — Telephone Encounter (Signed)
Right, thanks

## 2019-02-20 NOTE — Patient Instructions (Signed)
Pre-Operative Instructions  Congratulations, you have decided to take an important step towards improving your quality of life.  You can be assured that the doctors and staff at [facility] will be with you every step of the way.  Here are some important things you should know:  1. Plan to be at the surgery center/hospital at least 1 (one) hour prior to your scheduled time, unless otherwise directed by the surgical center/hospital staff.  You must have a responsible adult accompany you, remain during the surgery and drive you home.  Make sure you have directions to the surgical center/hospital to ensure you arrive on time. 2. If you are having surgery at [facility] or [facility], you will need a copy of your medical history and physical form from your family physician within one month prior to the date of surgery. We will give you a form for your primary physician to complete.  3. We make every effort to accommodate the date you request for surgery.  However, there are times where surgery dates or times have to be moved.  We will contact you as soon as possible if a change in schedule is required.   4. No aspirin/ibuprofen for one week before surgery.  If you are on aspirin, any non-steroidal anti-inflammatory medications (Mobic, Aleve, Ibuprofen) should not be taken seven (7) days prior to your surgery.  You make take Tylenol for pain prior to surgery.  5. Medications - If you are taking daily heart and blood pressure medications, seizure, reflux, allergy, asthma, anxiety, pain or diabetes medications, make sure you notify the surgery center/hospital before the day of surgery so they can tell you which medications you should take or avoid the day of surgery. 6. No food or drink after midnight the night before surgery unless directed otherwise by surgical center/hospital staff. 7. No alcoholic beverages 24-hours prior to surgery.  No smoking 24-hours prior or 24-hours after  surgery. 8. Wear loose pants or shorts. They should be loose enough to fit over bandages, boots, and casts. 9. Don't wear slip-on shoes. Sneakers are preferred. 10. Bring your boot with you to the surgery center/hospital.  Also bring crutches or a walker if your physician has prescribed it for you.  If you do not have this equipment, it will be provided for you after surgery. 11. If you have not been contacted by the surgery center/hospital by the day before your surgery, call to confirm the date and time of your surgery. 12. Leave-time from work may vary depending on the type of surgery you have.  Appropriate arrangements should be made prior to surgery with your employer. 13. Prescriptions will be provided immediately following surgery by your doctor.  Fill these as soon as possible after surgery and take the medication as directed. Pain medications will not be refilled on weekends and must be approved by the doctor. 14. Remove nail polish on the operative foot and avoid getting pedicures prior to surgery. 15. Wash the night before surgery.  The night before surgery wash the foot and leg well with water and the antibacterial soap provided. Be sure to pay special attention to beneath the toenails and in between the toes.  Wash for at least three (3) minutes. Rinse thoroughly with water and dry well with a towel.  Perform this wash unless told not to do so by your physician.  Enclosed: 1 Ice pack (please put in freezer the night before surgery)   1 Hibiclens skin cleaner     Pre-op instructions  If you have any questions regarding the instructions, please do not hesitate to call our office.  [place]: [address], [place], [place] [address] -- [phone]  [place]: [address] [place], [place] [address] -- [phone]  [place]: [address]  [place], [place] [address] -- [phone]  [place]: [address], [address], [place],  [address] -- [phone]  Website:  [email or web address] 

## 2019-02-20 NOTE — Progress Notes (Signed)
   HPI: 63 year old male T2DM -uncontrolled presents status post partial first ray amputation right foot. DOS: 12/05/2018.  Patient recently developed an ulceration of the plantar aspect of the second MTPJ and new findings of osteomyelitis to the second metatarsal head.  Patient was seen by infectious disease, Dr. Linus Salmons MD, on 02/19/2019.  Patient reports that Dr. Linus Salmons was not very optimistic with long-term IV antibiotics given the patient's history of uncontrolled diabetes and it would simply prolong the inevitable which would be additional amputation.  His consultation is greatly appreciated.  Patient presents today for follow-up treatment and evaluation  Past Medical History:  Diagnosis Date  . Back pain   . Diabetes mellitus   . Headache(784.0)    general  . Hyperlipidemia   . Hypertension   . Osteomyelitis ([place]) 11/2018   RIGHT FOOT     Physical Exam: General: The patient is alert and oriented x3 in no acute distress.  Dermatology: Skin is warm, dry and supple bilateral lower extremities.  New ulceration noted to the plantar aspect of the second MTPJ right foot measuring approximately 1.5 x 1.5 x 0.1 cm.  Moderate maceration noted.  Wound base is red with sanguinous drainage.  Ulceration appears very superficial at the moment with a dry stable plantar eschar.  Ulcer significantly improved since last visit.  Periwound integrity is intact  Vascular: Palpable pedal pulses bilaterally.  There is some erythema and edema noted to the right forefoot consistent with cellulitis however this is improved significantly. Capillary refill within normal limits.  Neurological: Epicritic and protective threshold diminished bilaterally.   Musculoskeletal Exam: Status post partial first ray amputation right foot  Assessment: 1.  Osteomyelitis second MTPJ right foot 2.  Ulcer right foot secondary to diabetes mellitus 3.  T2DM -uncontrolled 4.  H/o partial first ray amputation right foot. DOS: 12/05/2018    Plan of Care:  1. Patient evaluated.  2.  I do agree with Dr. Henreitta Leber recommendation that amputation would be the best option for this patient.  I explained to patient the advantages and disadvantages of surgery.  The patient would like to proceed with surgical amputation.  All possible complications and details the procedure were explained.  No guarantees were expressed or implied.  All patient questions answered. 3.  Given the COVID-19 pandemic, we will proceed to do this procedure in the outpatient surgical setting.  Authorization for surgery was initiated today.  Surgery will consist of transmetatarsal amputation right foot. 4.  New cultures were taken today and sent to pathology 5.  Continue oral Bactrim DS.  Refill provided 6.  Discontinue PICC line.  I will reach out to Dr. Henreitta Leber office and notify him of the patient's decision to proceed with midfoot amputation. 7.  Return to clinic 1 week postop      Edrick Kins, DPM [facility]  Dr. Edrick Kins, DPM    [address], [place] [address]                Office [phone]  Fax [phone]

## 2019-02-20 NOTE — Telephone Encounter (Signed)
Fernando Anthony from [facility] called stating patient saw his Podiatrist Dr. Amalia Hailey today and  agreed to have a midfoot foot amputation and cancelled his PICC placement appointment for IV antibiotics today 02/20/2019.  Stanton Kidney put the orders on hold just in case there is a change of plans.  Per Dr. Amalia Hailey note patient will be on oral antibiotics for now.  Pricilla Riffle RN

## 2019-02-21 ENCOUNTER — Encounter (HOSPITAL_COMMUNITY): Payer: BLUE CROSS/BLUE SHIELD

## 2019-02-22 ENCOUNTER — Encounter: Payer: Self-pay | Admitting: Sports Medicine

## 2019-02-22 ENCOUNTER — Telehealth: Payer: Self-pay | Admitting: *Deleted

## 2019-02-22 ENCOUNTER — Other Ambulatory Visit: Payer: Self-pay | Admitting: Sports Medicine

## 2019-02-22 MED ORDER — OXYCODONE-ACETAMINOPHEN 5-325 MG PO TABS: 1.0000 | ORAL_TABLET | Freq: Four times a day (QID) | ORAL | 0 refills | Status: AC | PRN

## 2019-02-22 NOTE — Progress Notes (Signed)
For foot pain

## 2019-02-22 NOTE — Telephone Encounter (Signed)
I checked the patients' benefits and eligibility online for his surgery that's scheduled for 02/28/2019.  Policy Effective : 18/98/4210 - 11/22/2019   In-Network    Max Per Benefit Period Year-to-Date Remaining  CoInsurance  $2,800.00 $0.00  Deductible  $2,800.00 $0.00  Out-Of-Pocket  $2,800.00 $0.00     Active Coverage  Messages: THIS MEMBER CURRENTLY HAS AN HSA WITH A PAYMENT OPTION WHICH ALLOWS FOR AUTOMATIC PAYMENT DIRECTLY TO THE PROVIDER. QUALIFIED MEDICAL EXPENSES WITH THE EXCEPTION OF DENIED SERVICES ARE ELIGIBLE FOR REIMBURSEMENT. Provider: SEE PROVIDER SPECIALTY PE: [id number] :

## 2019-02-22 NOTE — Postoperative (Signed)
Patient called with post op pain and reports that when he was in office on Wednesday did not get a refill but pain is unrelieved with OTC medication and request refill. Sent a few tabs to get him through the weekend until he can follow up in office or have surgery.  Dr. Cannon Kettle

## 2019-02-23 LAB — WOUND CULTURE
MICRO NUMBER:: 368608
RESULT:: NO GROWTH
SPECIMEN QUALITY:: ADEQUATE

## 2019-02-28 ENCOUNTER — Encounter: Payer: Self-pay | Admitting: Podiatry

## 2019-02-28 ENCOUNTER — Other Ambulatory Visit: Payer: Self-pay | Admitting: Podiatry

## 2019-02-28 DIAGNOSIS — M86671 Other chronic osteomyelitis, right ankle and foot: Secondary | ICD-10-CM | POA: Diagnosis not present

## 2019-02-28 MED ORDER — OXYCODONE-ACETAMINOPHEN 5-325 MG PO TABS: 1.0000 | ORAL_TABLET | Freq: Four times a day (QID) | ORAL | 0 refills | Status: DC | PRN

## 2019-02-28 NOTE — Progress Notes (Signed)
Post op pain 

## 2019-03-04 ENCOUNTER — Other Ambulatory Visit: Payer: Self-pay | Admitting: Podiatry

## 2019-03-04 ENCOUNTER — Encounter: Payer: Self-pay | Admitting: Podiatry

## 2019-03-04 MED ORDER — OXYCODONE-ACETAMINOPHEN 5-325 MG PO TABS: 1.0000 | ORAL_TABLET | Freq: Four times a day (QID) | ORAL | 0 refills | Status: DC | PRN

## 2019-03-04 NOTE — Progress Notes (Signed)
PRN postop pain 

## 2019-03-06 ENCOUNTER — Ambulatory Visit (INDEPENDENT_AMBULATORY_CARE_PROVIDER_SITE_OTHER): Payer: BLUE CROSS/BLUE SHIELD

## 2019-03-06 ENCOUNTER — Ambulatory Visit (INDEPENDENT_AMBULATORY_CARE_PROVIDER_SITE_OTHER): Payer: BLUE CROSS/BLUE SHIELD | Admitting: Podiatry

## 2019-03-06 ENCOUNTER — Other Ambulatory Visit: Payer: Self-pay

## 2019-03-06 VITALS — Temp 96.6°F

## 2019-03-06 DIAGNOSIS — E08621 Diabetes mellitus due to underlying condition with foot ulcer: Secondary | ICD-10-CM

## 2019-03-06 DIAGNOSIS — Z9889 Other specified postprocedural states: Secondary | ICD-10-CM

## 2019-03-06 DIAGNOSIS — L97511 Non-pressure chronic ulcer of other part of right foot limited to breakdown of skin: Secondary | ICD-10-CM | POA: Diagnosis not present

## 2019-03-07 ENCOUNTER — Encounter: Payer: Self-pay | Admitting: Podiatry

## 2019-03-08 ENCOUNTER — Other Ambulatory Visit: Payer: Self-pay | Admitting: Podiatry

## 2019-03-08 ENCOUNTER — Encounter: Payer: Self-pay | Admitting: Podiatry

## 2019-03-08 MED ORDER — OXYCODONE-ACETAMINOPHEN 10-325 MG PO TABS: 1.0000 | ORAL_TABLET | Freq: Four times a day (QID) | ORAL | 0 refills | Status: AC | PRN

## 2019-03-08 NOTE — Progress Notes (Signed)
.  postop

## 2019-03-12 NOTE — Progress Notes (Signed)
   Subjective:  Patient presents today status post transmetatarsal amputation to the right foot. DOS: 02/28/2019.  Patient states that is her foot continues to be very painful.  He is kept the dressings clean dry and intact and wearing the immobilization cam boot as directed.  No new complaints at this time  Past Medical History:  Diagnosis Date  . Back pain   . Diabetes mellitus   . Headache(784.0)    general  . Hyperlipidemia   . Hypertension   . Osteomyelitis ([place]) 11/2018   RIGHT FOOT      Objective/Physical Exam Neurovascular status intact.  Skin incisions appear to be well coapted with sutures and staples intact.  Significant amount of erythema noted to the forefoot surgical site.  No dehiscence. No active bleeding noted. Moderate edema noted to the surgical extremity.  Radiographic Exam:  Orthopedic hardware and osteotomies sites appear to be stable with routine healing.  Assessment: 1. s/p transmetatarsal amputation right foot. DOS: 02/28/2019   Plan of Care:  1. Patient was evaluated. X-rays reviewed 2.  Today dressings were changed.  Keep clean dry and intact for an additional week 3.  Continue weightbearing in the immobilization cam boot 4.  Prescription for Percocet 10/325 mg 5.  Continue oral Bactrim DS 6.  Return to clinic in 1 week   Edrick Kins, DPM [facility]  Dr. Edrick Kins, [place]                                        [place], [place] [address]                Office [phone]  Fax [phone]

## 2019-03-13 ENCOUNTER — Ambulatory Visit (INDEPENDENT_AMBULATORY_CARE_PROVIDER_SITE_OTHER): Payer: BLUE CROSS/BLUE SHIELD | Admitting: Podiatry

## 2019-03-13 ENCOUNTER — Other Ambulatory Visit: Payer: Self-pay

## 2019-03-13 ENCOUNTER — Encounter: Payer: Self-pay | Admitting: Podiatry

## 2019-03-13 VITALS — Temp 97.3°F

## 2019-03-13 DIAGNOSIS — Z9889 Other specified postprocedural states: Secondary | ICD-10-CM

## 2019-03-13 DIAGNOSIS — L97511 Non-pressure chronic ulcer of other part of right foot limited to breakdown of skin: Secondary | ICD-10-CM

## 2019-03-13 DIAGNOSIS — E08621 Diabetes mellitus due to underlying condition with foot ulcer: Secondary | ICD-10-CM

## 2019-03-13 NOTE — Progress Notes (Signed)
   Subjective:  Patient presents today status post transmetatarsal amputation to the right foot. DOS: 02/28/2019.  Patient states he is improving significantly.  The pain has decreased over the past week.  He has been changing dressings with Betadine and dry sterile dressing daily.  He continues to be weightbearing in the cam boot  Past Medical History:  Diagnosis Date  . Back pain   . Diabetes mellitus   . Headache(784.0)    general  . Hyperlipidemia   . Hypertension   . Osteomyelitis ([place]) 11/2018   RIGHT FOOT      Objective/Physical Exam Neurovascular status intact.  Skin incisions appear to be well coapted with staples intact.  Erythema to the amputation stump appears to be improved significantly.   No dehiscence. No active bleeding noted. Moderate edema noted to the surgical extremity.  Assessment: 1. s/p transmetatarsal amputation right foot. DOS: 02/28/2019   Plan of Care:  1. Patient was evaluated. 2.  Today dressings were changed.  Continue daily dressing changes at home using iodine ointment  3.  Continue weightbearing in the immobilization cam boot 4.  Prescription for Percocet 5/325 mg 5.  Continue oral Bactrim DS as prescribed.  Once antibiotics are completed I do not see any need for additional antibiotic therapy. 6.  Return to clinic in 1 week for possible staple removal   Edrick Kins, DPM [facility]  Dr. Edrick Kins, [place]                                        [place], [place] [address]                Office [phone]  Fax [phone]

## 2019-03-14 ENCOUNTER — Encounter: Payer: Self-pay | Admitting: Podiatry

## 2019-03-14 NOTE — Telephone Encounter (Signed)
Dr. Amalia Hailey, can you please send in pain medication for this patient,  It doesn't look like it was sent yesterday.  Thanks!

## 2019-03-15 ENCOUNTER — Encounter: Payer: Self-pay | Admitting: Podiatry

## 2019-03-15 MED ORDER — OXYCODONE-ACETAMINOPHEN 5-325 MG PO TABS: 1.0000 | ORAL_TABLET | Freq: Four times a day (QID) | ORAL | 0 refills | Status: DC | PRN

## 2019-03-15 NOTE — Telephone Encounter (Signed)
Rx was sent this AM.

## 2019-03-15 NOTE — Addendum Note (Signed)
Addended by: Edrick Kins on: 03/15/2019 08:42 AM   Modules accepted: Orders

## 2019-03-20 ENCOUNTER — Ambulatory Visit: Payer: BLUE CROSS/BLUE SHIELD | Admitting: Internal Medicine

## 2019-03-20 ENCOUNTER — Encounter: Payer: Self-pay | Admitting: Podiatry

## 2019-03-20 ENCOUNTER — Ambulatory Visit (INDEPENDENT_AMBULATORY_CARE_PROVIDER_SITE_OTHER): Payer: BLUE CROSS/BLUE SHIELD | Admitting: Podiatry

## 2019-03-20 ENCOUNTER — Other Ambulatory Visit: Payer: Self-pay

## 2019-03-20 ENCOUNTER — Ambulatory Visit (INDEPENDENT_AMBULATORY_CARE_PROVIDER_SITE_OTHER): Payer: BLUE CROSS/BLUE SHIELD

## 2019-03-20 VITALS — Temp 97.9°F

## 2019-03-20 DIAGNOSIS — M205X2 Other deformities of toe(s) (acquired), left foot: Secondary | ICD-10-CM | POA: Diagnosis not present

## 2019-03-20 DIAGNOSIS — M79675 Pain in left toe(s): Secondary | ICD-10-CM

## 2019-03-20 DIAGNOSIS — Z9889 Other specified postprocedural states: Secondary | ICD-10-CM

## 2019-03-20 DIAGNOSIS — M19072 Primary osteoarthritis, left ankle and foot: Secondary | ICD-10-CM | POA: Diagnosis not present

## 2019-03-20 DIAGNOSIS — L97511 Non-pressure chronic ulcer of other part of right foot limited to breakdown of skin: Secondary | ICD-10-CM

## 2019-03-20 DIAGNOSIS — E08621 Diabetes mellitus due to underlying condition with foot ulcer: Secondary | ICD-10-CM

## 2019-03-20 NOTE — Progress Notes (Signed)
   Subjective:  Patient presents today status post transmetatarsal amputation to the right foot. DOS: 02/28/2019.  Patient states he is improving significantly.  Patient has been weightbearing in the immobilization cam boot as directed.  He discontinued his oral antibiotics approximately 2 days ago due to stomach pain.  Patient stomach pain improved after discontinuing the antibiotics. Patient also has a new complaint today regarding pain to the contralateral limb.  Patient has a history of an ulcer to the left great toe approximately 2 years ago and he is noticed a hard lump on the side of his toe.  He is concerned that he does not want recurrent ulcerations that could possibly lead to amputation.  Apparently at one point the physician recommended amputation of his left great toe.  Past Medical History:  Diagnosis Date  . Back pain   . Diabetes mellitus   . Headache(784.0)    general  . Hyperlipidemia   . Hypertension   . Osteomyelitis ([place]) 11/2018   RIGHT FOOT      Objective/Physical Exam Neurovascular status intact.  Skin incisions appear to be well coapted with staples intact.  Erythema to the amputation stump appears to be improved significantly with only localized erythema to the surgical incision site forefoot.   No dehiscence. No active bleeding noted. Moderate edema noted to the surgical extremity.  Palpable bony exostosis noted to the dorsal lateral aspect of the first MTPJ of the left foot.  There is also exostosis noted at the IPJ left great toe.  Limited range of motion also noted to the IPJ left great toe findings are consistent with radiographic findings  Radiographic exam RT: Osteotomies are stable with routine healing.  No gas within the tissues.  No evidence of osteolytic process or changes to any cortical bone LT: DJD with joint space destruction noted to the IPJ of the left hallux.  Bone spur formation noted around the joint periarticularly.  There is also a periarticular  spur noted to the lateral aspect of the first MTPJ left foot.  Normal osseous mineralization.  No fracture identified.  Assessment: 1. s/p transmetatarsal amputation right foot. DOS: 02/28/2019 2.  Pre-ulcerative callus lesion left great toe 3.  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 Patient was evaluated. 2.  Today dressings were changed.  Continue daily dressing changes at home using iodine ointment  3.  Continue weightbearing in the immobilization cam boot 4.  Partial staples removed today 5.  In regards to the left great toe, at one point in the future we may need to address hallux IPJ arthrodesis with bone spur resection to prevent any future problems or ulcerations.  This is nonemergent and will be addressed once wound is healed and the patient is optimized and his diabetes is been under better control and management 6.  Return to clinic in 1 week to remove the remaining staples  Edrick Kins, DPM [facility]  Dr. Edrick Kins, [place] [address]                                        [place], [place] [address]                Office [phone]  Fax [phone]

## 2019-03-21 ENCOUNTER — Other Ambulatory Visit: Payer: Self-pay | Admitting: Podiatry

## 2019-03-21 DIAGNOSIS — M19072 Primary osteoarthritis, left ankle and foot: Secondary | ICD-10-CM

## 2019-03-21 NOTE — Progress Notes (Signed)
DOS  02/28/2019  Transmetatarsal amputation of right foot.

## 2019-04-01 ENCOUNTER — Ambulatory Visit: Payer: BLUE CROSS/BLUE SHIELD | Admitting: Orthotics

## 2019-04-01 ENCOUNTER — Ambulatory Visit (INDEPENDENT_AMBULATORY_CARE_PROVIDER_SITE_OTHER): Payer: BLUE CROSS/BLUE SHIELD | Admitting: Podiatry

## 2019-04-01 ENCOUNTER — Other Ambulatory Visit: Payer: Self-pay

## 2019-04-01 VITALS — Temp 97.2°F

## 2019-04-01 DIAGNOSIS — M205X2 Other deformities of toe(s) (acquired), left foot: Secondary | ICD-10-CM

## 2019-04-01 DIAGNOSIS — L97511 Non-pressure chronic ulcer of other part of right foot limited to breakdown of skin: Secondary | ICD-10-CM

## 2019-04-01 DIAGNOSIS — Z9889 Other specified postprocedural states: Secondary | ICD-10-CM

## 2019-04-01 DIAGNOSIS — E08621 Diabetes mellitus due to underlying condition with foot ulcer: Secondary | ICD-10-CM

## 2019-04-01 DIAGNOSIS — Z09 Encounter for follow-up examination after completed treatment for conditions other than malignant neoplasm: Secondary | ICD-10-CM

## 2019-04-01 DIAGNOSIS — M86171 Other acute osteomyelitis, right ankle and foot: Secondary | ICD-10-CM

## 2019-04-01 DIAGNOSIS — M79675 Pain in left toe(s): Secondary | ICD-10-CM

## 2019-04-01 DIAGNOSIS — L97512 Non-pressure chronic ulcer of other part of right foot with fat layer exposed: Secondary | ICD-10-CM

## 2019-04-01 NOTE — Progress Notes (Signed)
Patient wants two pair of shoes to accomodate Toe filler inserts, [employer] to be billed.     Dawn to ck coverage for DBS

## 2019-04-04 NOTE — Progress Notes (Signed)
   Subjective:  Patient presents today status post transmetatarsal amputation to the right foot. DOS: 02/28/2019. He has been wearing a regular shoe and states the foot "feels weird" while doing so. He denies any other significant pain at this time. There are no other modifying factors noted. Patient is here for further evaluation and treatment.   Past Medical History:  Diagnosis Date  . Back pain   . Diabetes mellitus   . Headache(784.0)    general  . Hyperlipidemia   . Hypertension   . Osteomyelitis ([place]) 11/2018   RIGHT FOOT      Objective/Physical Exam Neurovascular status intact.  Skin incisions appear to be well coapted. Small area of ulceration at the TMA stump noted measuring 1.0 x 1.5 x 0.2 cm. Moderate edema noted to the surgical extremity.  To the above-noted ulceration, there is no eschar. There is a moderate amount of slough, fibrin and necrotic tissue. Granulation tissue and wound base is red. There is no malodor. There is a minimal amount of serosanginous drainage noted. Periwound integrity is intact.   Assessment: 1. s/p transmetatarsal amputation right foot. DOS: 02/28/2019 2. Ulceration of the right TMA stump  Plan of Care:  1. Patient was evaluated. 2. Medically necessary excisional debridement including subcutaneous tissue was performed using a tissue nipper and a chisel blade. Excisional debridement of all the necrotic nonviable tissue down to healthy bleeding viable tissue was performed with post-debridement measurements same as pre-. 3. The wound was cleansed and dry sterile dressing applied. 4. Fitted today by Liliane Channel for DM shoes.  5. Recommended collagen daily with a bandage.  6. Return to clinic in 3 weeks.   Edrick Kins, DPM [facility]  Dr. Edrick Kins, [address]                                        [place], [place] [address]                Office [phone]  Fax [phone]

## 2019-04-22 ENCOUNTER — Ambulatory Visit (INDEPENDENT_AMBULATORY_CARE_PROVIDER_SITE_OTHER): Payer: BLUE CROSS/BLUE SHIELD

## 2019-04-22 ENCOUNTER — Ambulatory Visit (INDEPENDENT_AMBULATORY_CARE_PROVIDER_SITE_OTHER): Payer: BLUE CROSS/BLUE SHIELD | Admitting: Podiatry

## 2019-04-22 ENCOUNTER — Encounter: Payer: Self-pay | Admitting: Podiatry

## 2019-04-22 ENCOUNTER — Other Ambulatory Visit: Payer: BLUE CROSS/BLUE SHIELD | Admitting: Orthotics

## 2019-04-22 ENCOUNTER — Other Ambulatory Visit: Payer: Self-pay

## 2019-04-22 VITALS — Temp 98.1°F

## 2019-04-22 DIAGNOSIS — E08621 Diabetes mellitus due to underlying condition with foot ulcer: Secondary | ICD-10-CM

## 2019-04-22 DIAGNOSIS — L97512 Non-pressure chronic ulcer of other part of right foot with fat layer exposed: Secondary | ICD-10-CM

## 2019-04-22 DIAGNOSIS — Z9889 Other specified postprocedural states: Secondary | ICD-10-CM

## 2019-04-22 DIAGNOSIS — L97511 Non-pressure chronic ulcer of other part of right foot limited to breakdown of skin: Secondary | ICD-10-CM

## 2019-04-24 NOTE — Progress Notes (Signed)
   Subjective:  Patient presents today status post transmetatarsal amputation to the right foot. DOS: 02/28/2019. He is here for follow up evaluation of an ulceration of the right TMA stump. He reports associated swelling. There are no modifying factors noted. He has been applying collagen with a bandage as directed. Patient is here for further evaluation and treatment.   Past Medical History:  Diagnosis Date  . Back pain   . Diabetes mellitus   . Headache(784.0)    general  . Hyperlipidemia   . Hypertension   . Osteomyelitis ([place]) 11/2018   RIGHT FOOT      Objective/Physical Exam Neurovascular status intact.  Skin incisions appear to be well coapted. Small area of ulceration at the TMA stump noted measuring 0.1 x 0.6 x 0.2 cm. Edema noted to the right lower extremity.  To the above-noted ulceration, there is no eschar. There is a moderate amount of slough, fibrin and necrotic tissue. Granulation tissue and wound base is red. There is no malodor. There is a minimal amount of serosanginous drainage noted. Periwound integrity is intact.   Radiographic Exam:  Orthopedic hardware and osteotomies sites appear to be stable with routine healing. No evidence of osteomyelitis.   Assessment: 1. s/p transmetatarsal amputation right foot. DOS: 02/28/2019 2. Ulceration of the right TMA stump 3. Edema RLE  Plan of Care:  1. Patient was evaluated. X-Rays reviewed.  2. Medically necessary excisional debridement including subcutaneous tissue was performed using a tissue nipper and a chisel blade. Excisional debridement of all the necrotic nonviable tissue down to healthy bleeding viable tissue was performed with post-debridement measurements same as pre-. 3. The wound was cleansed and dry sterile dressing applied. 4. Recommended using collagen and a bandage daily.  5. Patient waiting on DM shoes with toe filler.  6. Return to clinic in 3 months.    Edrick Kins, DPM [facility]   Dr. Edrick Kins, [place]                                        [place], [place] [address]                Office [phone]  Fax [phone]

## 2019-04-30 ENCOUNTER — Other Ambulatory Visit: Payer: BLUE CROSS/BLUE SHIELD | Admitting: Orthotics

## 2019-05-01 ENCOUNTER — Telehealth: Payer: Self-pay | Admitting: Podiatry

## 2019-05-03 NOTE — Telephone Encounter (Signed)
Fernando Anthony with [employer] called to follow up on medical records request dated 06 May. Have not received anything back and is requesting an estimated time return. Can be reached at [phone].

## 2019-05-20 ENCOUNTER — Ambulatory Visit: Payer: BLUE CROSS/BLUE SHIELD | Admitting: Orthotics

## 2019-05-22 ENCOUNTER — Ambulatory Visit: Payer: BC Managed Care – PPO | Admitting: Podiatry

## 2019-06-11 DIAGNOSIS — Z89431 Acquired absence of right foot: Secondary | ICD-10-CM | POA: Insufficient documentation

## 2019-06-11 DIAGNOSIS — M792 Neuralgia and neuritis, unspecified: Secondary | ICD-10-CM | POA: Insufficient documentation

## 2019-06-21 ENCOUNTER — Telehealth: Payer: Self-pay | Admitting: Podiatry

## 2019-06-21 NOTE — Telephone Encounter (Signed)
Left message on wife's number for pt to call to schedule an appt to pick up shoes/inserts that authorization expires 8.29.2020.Marland Kitchen

## 2019-07-03 ENCOUNTER — Other Ambulatory Visit: Payer: Self-pay | Admitting: Podiatry

## 2019-07-03 ENCOUNTER — Ambulatory Visit (INDEPENDENT_AMBULATORY_CARE_PROVIDER_SITE_OTHER): Payer: BC Managed Care – PPO | Admitting: Podiatry

## 2019-07-03 ENCOUNTER — Encounter: Payer: Self-pay | Admitting: Podiatry

## 2019-07-03 ENCOUNTER — Ambulatory Visit (INDEPENDENT_AMBULATORY_CARE_PROVIDER_SITE_OTHER): Payer: BC Managed Care – PPO

## 2019-07-03 ENCOUNTER — Other Ambulatory Visit: Payer: Self-pay

## 2019-07-03 DIAGNOSIS — L97512 Non-pressure chronic ulcer of other part of right foot with fat layer exposed: Secondary | ICD-10-CM

## 2019-07-03 DIAGNOSIS — I70235 Atherosclerosis of native arteries of right leg with ulceration of other part of foot: Secondary | ICD-10-CM | POA: Diagnosis not present

## 2019-07-03 DIAGNOSIS — L97511 Non-pressure chronic ulcer of other part of right foot limited to breakdown of skin: Secondary | ICD-10-CM

## 2019-07-03 DIAGNOSIS — M79671 Pain in right foot: Secondary | ICD-10-CM

## 2019-07-03 DIAGNOSIS — E08621 Diabetes mellitus due to underlying condition with foot ulcer: Secondary | ICD-10-CM

## 2019-07-04 ENCOUNTER — Encounter: Payer: Self-pay | Admitting: Podiatry

## 2019-07-04 ENCOUNTER — Other Ambulatory Visit: Payer: Self-pay

## 2019-07-05 ENCOUNTER — Telehealth: Payer: Self-pay | Admitting: Podiatry

## 2019-07-05 ENCOUNTER — Ambulatory Visit: Payer: BC Managed Care – PPO | Admitting: Podiatry

## 2019-07-05 NOTE — Progress Notes (Signed)
   HPI: 63 y.o. male history of uncontrolled diabetes mellitus and transmetatarsal amputation right foot presenting today for a new ulceration that developed to the right foot.  Patient developed ulcer to the right plantar foot stump approximately 3 weeks ago.  There is been some drainage and he presents for wound management and treatment.  Past Medical History:  Diagnosis Date  . Back pain   . Diabetes mellitus   . Headache(784.0)    general  . Hyperlipidemia   . Hypertension   . Osteomyelitis ([place]) 11/2018   RIGHT FOOT     Physical Exam: General: The patient is alert and oriented x3 in no acute distress.  Dermatology: Skin is warm, dry and supple bilateral lower extremities.  Ulcer noted to the right plantar foot stump measuring approximately 0.6 and 0.6 of 0.2 cm.  To the noted ulceration there is no eschar.  There is a moderate amount of slough fibrin and necrotic tissue noted.  Moderate serosanguineous drainage noted.  Periwound integrity is callused.  No malodor.  Vascular: Palpable pedal pulses bilaterally. No edema or erythema noted. Capillary refill within normal limits.  Neurological: Epicritic and protective threshold grossly intact bilaterally.   Musculoskeletal Exam: History of transmetatarsal amputation right foot with routine healing  Radiographic Exam:  Normal osseous mineralization. Joint spaces preserved. No fracture/dislocation/boney destruction.  Evidence of transmetatarsal amputation right foot.  No evidence of osteomyelitis  Assessment: 1.  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  History of transmetatarsal amputation right foot   Plan of Care:  1. Patient evaluated. X-Rays reviewed.  2.  Medically necessary excisional debridement including subcutaneous tissue was performed using a tissue nipper.  Excisional debridement of all necrotic nonviable tissue down to healthy bleeding viable tissue was performed with post debridement measurement same as  pre-. 3.  Recommend iodine ointment and dry sterile dressing daily 4.  Cultures were taken and sent to pathology for culture and sensitivity 5.  The patient has a current prescription for Bactrim DS.  Resume Bactrim DS 2 times daily x10 days 6.  Short cam boot dispensed.  Weightbearing as tolerated 7.  Metatarsal pads provided to offload pressure from the ulceration site 8.  The patient is diabetic shoes and custom molded inserts here in the office were also provided to the patient today with break instructions provided.   9.  Return to clinic in 2 weeks      Edrick Kins, DPM [facility]  Dr. Edrick Kins, DPM    [address], [place] [address]                Office [phone]  Fax [phone]

## 2019-07-05 NOTE — Telephone Encounter (Signed)
Patient is having severe shooting pains in his right foot and ankle with bruising and swelling in the ankle. The patient was offered an appt today but states he is in [place], [place] and is unable to come in today. Patient is requesting that we send in pain medication for him.  Per Marcy Siren, please advise.  I have an appt held for him on Monday at 3:45pm in case he needs to come in before we can send more pain medication.

## 2019-07-05 NOTE — Telephone Encounter (Signed)
Scheduled pt to see doctor in office today.

## 2019-07-06 LAB — WOUND CULTURE
MICRO NUMBER:: 764046
SPECIMEN QUALITY:: ADEQUATE

## 2019-07-08 ENCOUNTER — Other Ambulatory Visit: Payer: Self-pay

## 2019-07-08 ENCOUNTER — Emergency Department (HOSPITAL_BASED_OUTPATIENT_CLINIC_OR_DEPARTMENT_OTHER)
Admission: EM | Admit: 2019-07-08 | Discharge: 2019-07-08 | Disposition: A | Discharge status: Home / Self Care | Payer: BC Managed Care – PPO | Attending: Emergency Medicine | Admitting: Emergency Medicine

## 2019-07-08 ENCOUNTER — Ambulatory Visit (INDEPENDENT_AMBULATORY_CARE_PROVIDER_SITE_OTHER): Payer: BC Managed Care – PPO | Admitting: Podiatry

## 2019-07-08 ENCOUNTER — Encounter: Payer: Self-pay | Admitting: Podiatry

## 2019-07-08 ENCOUNTER — Encounter (HOSPITAL_BASED_OUTPATIENT_CLINIC_OR_DEPARTMENT_OTHER): Payer: Self-pay

## 2019-07-08 ENCOUNTER — Emergency Department (HOSPITAL_BASED_OUTPATIENT_CLINIC_OR_DEPARTMENT_OTHER): Payer: BC Managed Care – PPO

## 2019-07-08 VITALS — Temp 98.2°F

## 2019-07-08 DIAGNOSIS — Z794 Long term (current) use of insulin: Secondary | ICD-10-CM | POA: Diagnosis not present

## 2019-07-08 DIAGNOSIS — Z7982 Long term (current) use of aspirin: Secondary | ICD-10-CM | POA: Insufficient documentation

## 2019-07-08 DIAGNOSIS — L97511 Non-pressure chronic ulcer of other part of right foot limited to breakdown of skin: Secondary | ICD-10-CM | POA: Diagnosis not present

## 2019-07-08 DIAGNOSIS — E119 Type 2 diabetes mellitus without complications: Secondary | ICD-10-CM | POA: Insufficient documentation

## 2019-07-08 DIAGNOSIS — Y929 Unspecified place or not applicable: Secondary | ICD-10-CM | POA: Insufficient documentation

## 2019-07-08 DIAGNOSIS — L03115 Cellulitis of right lower limb: Secondary | ICD-10-CM | POA: Diagnosis not present

## 2019-07-08 DIAGNOSIS — L97512 Non-pressure chronic ulcer of other part of right foot with fat layer exposed: Secondary | ICD-10-CM

## 2019-07-08 DIAGNOSIS — Y999 Unspecified external cause status: Secondary | ICD-10-CM | POA: Insufficient documentation

## 2019-07-08 DIAGNOSIS — W298XXA Contact with other powered powered hand tools and household machinery, initial encounter: Secondary | ICD-10-CM | POA: Insufficient documentation

## 2019-07-08 DIAGNOSIS — Z79899 Other long term (current) drug therapy: Secondary | ICD-10-CM | POA: Insufficient documentation

## 2019-07-08 DIAGNOSIS — E08621 Diabetes mellitus due to underlying condition with foot ulcer: Secondary | ICD-10-CM | POA: Diagnosis not present

## 2019-07-08 DIAGNOSIS — I10 Essential (primary) hypertension: Secondary | ICD-10-CM | POA: Diagnosis not present

## 2019-07-08 DIAGNOSIS — Y939 Activity, unspecified: Secondary | ICD-10-CM | POA: Diagnosis not present

## 2019-07-08 DIAGNOSIS — F17210 Nicotine dependence, cigarettes, uncomplicated: Secondary | ICD-10-CM | POA: Diagnosis not present

## 2019-07-08 DIAGNOSIS — S61512A Laceration without foreign body of left wrist, initial encounter: Secondary | ICD-10-CM | POA: Diagnosis not present

## 2019-07-08 DIAGNOSIS — Z20828 Contact with and (suspected) exposure to other viral communicable diseases: Secondary | ICD-10-CM | POA: Diagnosis not present

## 2019-07-08 DIAGNOSIS — S59912A Unspecified injury of left forearm, initial encounter: Secondary | ICD-10-CM | POA: Diagnosis present

## 2019-07-08 LAB — SARS CORONAVIRUS 2 (TAT 6-24 HRS): SARS Coronavirus 2: NEGATIVE

## 2019-07-08 MED ORDER — OXYCODONE-ACETAMINOPHEN 10-325 MG PO TABS: 1.0000 | ORAL_TABLET | Freq: Three times a day (TID) | ORAL | 0 refills | Status: AC | PRN

## 2019-07-08 MED ORDER — OXYCODONE-ACETAMINOPHEN 5-325 MG PO TABS
1.0000 | ORAL_TABLET | Freq: Once | ORAL | Status: AC
  Administered 2019-07-08: 1 via ORAL
  Filled 2019-07-08: qty 1

## 2019-07-08 MED ORDER — GENTAMICIN SULFATE 0.1 % EX CREA: 1.0000 "application " | TOPICAL_CREAM | Freq: Two times a day (BID) | CUTANEOUS | 1 refills | Status: DC

## 2019-07-08 MED ORDER — CIPROFLOXACIN HCL 500 MG PO TABS: 500.0000 mg | ORAL_TABLET | Freq: Two times a day (BID) | ORAL | 0 refills | Status: DC

## 2019-07-08 MED ORDER — LIDOCAINE-EPINEPHRINE (PF) 2 %-1:200000 IJ SOLN
10.0000 mL | Freq: Once | INTRAMUSCULAR | Status: AC
  Administered 2019-07-08: 10 mL
  Filled 2019-07-08: qty 10

## 2019-07-08 NOTE — ED Notes (Signed)
Dressing applied. 

## 2019-07-08 NOTE — ED Triage Notes (Addendum)
Pt states he cut left wrist area with a grinder ~1hour PTA-pt has dsg intact-NAD-steady gait

## 2019-07-08 NOTE — ED Provider Notes (Signed)
[facility] EMERGENCY DEPARTMENT Provider Note   CSN: [id number] Arrival date & time: 07/08/19  1137     History   Chief Complaint Chief Complaint  Patient presents with  . Arm Injury    HPI Fernando Anthony is a 63 y.o. male.  Presents emergency department with a left wrist laceration, states he cut it on a grinder about 1 hour prior to arrival.  Mom noted mild amount of tingling in his hand, denies weakness, skin color changes.  States bleeding stopped with direct pressure.  Right-hand-dominant.  Denies other injury.     HPI  Past Medical History:  Diagnosis Date  . Back pain   . Diabetes mellitus   . Headache(784.0)    general  . Hyperlipidemia   . Hypertension   . Osteomyelitis ([place]) 11/2018   RIGHT FOOT    Patient Active Problem List   Diagnosis Date Noted  . Osteomyelitis of right foot ([place]) 12/04/2018  . Hypertensive urgency 12/04/2018  . Abnormal LFTs 12/04/2018  . Hyponatremia 12/04/2018  . Osteomyelitis ([place]) 12/04/2018  . Hypertension, essential, benign 08/20/2017  . Hypokalemia 08/20/2017  . Lumbar post-laminectomy syndrome 09/16/2014  . Diabetic neuropathy ([place]) 04/04/2013  . Postlaminectomy syndrome, lumbar region 05/18/2012  . 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 AMPUTATION Right 09/18/2013   Procedure: Amputation Right Great Toe at [facility];  Surgeon: Newt Minion, MD;  Location: [facility];  Service: Orthopedics;  Laterality: Right;  Amputation Right Great Toe at [facility]  . AMPUTATION Right 12/05/2018   Procedure: PARTIAL AMPUTATION FIRST RAY RIGHT FOOT;  Surgeon: Edrick Kins, DPM;  Location: [facility];  Service: Podiatry;  Laterality: Right;  . CARDIAC CATHETERIZATION  ?1990  . SPINE SURGERY          Home Medications    Prior to Admission medications   Medication Sig Start Date End Date Taking? Authorizing Provider  aspirin 81 MG tablet Take 81 mg by  mouth at bedtime.     [provider]  atenolol (TENORMIN) 25 MG tablet Take 25 mg by mouth at bedtime.  02/09/18   [provider]  cloNIDine (CATAPRES) 0.1 MG tablet Take by mouth. 01/31/19   [provider]  Continuous Blood Gluc Sensor (FREESTYLE LIBRE 14 DAY SENSOR) MISC ONE EACH (1 DEVICE DOSE) BY DOES NOT APPLY ROUTE EVERY 14 (FOURTEEN) DAYS. 01/19/19   [provider]  cyclobenzaprine (FLEXERIL) 10 MG tablet Take 1 tablet (10 mg total) by mouth at bedtime. 09/16/14   Kirsteins, Luanna Salk, MD  FIASP FLEXTOUCH 100 UNIT/ML SOPN INJECT 6 UNITS INTO THE SKIN 3 (THREE) TIMES DAILY WITH MEALS. **DRUG NOT COVERED** 01/19/19   [provider]  gabapentin (NEURONTIN) 100 MG capsule Take 1 capsule (100 mg total) by mouth 3 (three) times daily. 09/16/14   Kirsteins, Luanna Salk, MD  hydrochlorothiazide (MICROZIDE) 12.5 MG capsule Take 12.5 mg by mouth daily.  08/23/18   [provider]  insulin glargine (LANTUS) 100 UNIT/ML injection Inject 0.35 mLs (35 Units total) into the skin 2 (two) times daily. Patient taking differently: Inject 35-50 Units into the skin 2 (two) times daily. Take 50 units in the morning and 35 units in the evening 06/17/17   Shary Decamp, PA-C  lisinopril (PRINIVIL,ZESTRIL) 10 MG tablet Take 10 mg by mouth daily.     [provider]  metFORMIN (GLUCOPHAGE) 1000 MG tablet  01/03/19   [provider]  Omega-3 Fatty Acids (FISH OIL) 500 MG CAPS Take 500 mg by mouth at bedtime.     [provider]  oxyCODONE-acetaminophen (PERCOCET) 5-325 MG tablet Take 1 tablet by mouth every 6 (six) hours as needed for severe pain. 03/15/19   Edrick Kins, DPM  rosuvastatin (CRESTOR) 20 MG tablet TAKE ONE TABLET (20 MG DOSE) BY MOUTH AT BEDTIME. 01/09/19   [provider]  sulfamethoxazole-trimethoprim (BACTRIM DS,SEPTRA DS) 800-160 MG tablet Take 1 tablet by mouth 2 (two) times daily. 02/20/19   Edrick Kins, DPM  traMADol  Veatrice Bourbon) 50 MG tablet Take 2 Tablets every 6 hours as needed for pain 07/25/14   Bayard Hugger, NP  traZODone (DESYREL) 100 MG tablet Take by mouth. 01/18/19 02/17/19  [provider]    Family History Family History  Problem Relation Age of Onset  . Cancer Mother   . Heart disease Father     Social History Social History   Tobacco Use  . Smoking status: Current Every Day Smoker    Packs/day: 1.00    Years: 40.00    Pack years: 40.00    Types: Cigarettes  . Smokeless tobacco: Never Used  Substance Use Topics  . Alcohol use: Not Currently  . Drug use: Not Currently    Comment: past use of marijuana     Allergies   Vicodin [hydrocodone-acetaminophen]   Review of Systems Review of Systems  Constitutional: Negative for chills and fever.  HENT: Negative for ear pain and sore throat.   Eyes: Negative for pain and visual disturbance.  Respiratory: Negative for cough and shortness of breath.   Cardiovascular: Negative for chest pain and palpitations.  Gastrointestinal: Negative for abdominal pain and vomiting.  Genitourinary: Negative for dysuria and hematuria.  Musculoskeletal: Negative for arthralgias and back pain.  Skin: Negative for color change and rash.  Neurological: Negative for seizures and syncope.  All other systems reviewed and are negative.    Physical Exam Updated Vital Signs BP 125/73 (BP Location: Left Arm)   Pulse (!) 55   Resp 18   Ht 6\' 2"  (1.88 m)   Wt 83.4 kg   SpO2 96%   BMI 23.61 kg/m   Physical Exam Constitutional:      Appearance: Normal appearance.  HENT:     Head: Normocephalic and atraumatic.     Mouth/Throat:     Mouth: Mucous membranes are moist.     Pharynx: Oropharynx is clear.  Neck:     Musculoskeletal: Normal range of motion and neck supple.  Cardiovascular:     Rate and Rhythm: Normal rate.     Pulses: Normal pulses.  Pulmonary:     Effort: Pulmonary effort is normal. No respiratory distress.  Abdominal:      General: Abdomen is flat. There is no distension.  Musculoskeletal:     Comments: LUE: 2cm horizontal laceration over radial aspect of left wrist; 2+ radial pulses, brisk cap refill in all fingers, normal flexion and extension of all fingers, sensation to light touch decreased over radial aspect of thumb, dorsum of hand over 2nd and 3rd carpal bones  Neurological:     Mental Status: He is alert.      ED Treatments / Results  Labs (all labs ordered are listed, but only abnormal results are displayed) Labs Reviewed  SARS CORONAVIRUS 2    EKG None  Radiology Dg Wrist Complete Left  Result Date: 07/08/2019 CLINICAL DATA:  Laceration, cut wrist with  grinder EXAM: LEFT WRIST - COMPLETE 3+ VIEW COMPARISON:  None. FINDINGS: No fracture or dislocation of the left wrist. Joint spaces are well preserved. There is a laceration of the lateral (thenar) wrist overlying the radial styloid with faint radiopaque foreign debris. Vascular calcinosis. IMPRESSION: No fracture or dislocation of the left wrist. Joint spaces are well preserved. There is a laceration of the lateral (thenar) wrist overlying the radial styloid with faint radiopaque foreign debris. Vascular calcinosis. Electronically Signed   By: Eddie Candle M.D.   On: 07/08/2019 12:11    Procedures .Marland KitchenLaceration Repair  Date/Time: 07/08/2019 4:04 PM Performed by: Lucrezia Starch, MD Authorized by: Lucrezia Starch, MD   Consent:    Consent obtained:  Verbal   Consent given by:  Patient   Risks discussed:  Infection, need for additional repair, nerve damage, vascular damage and retained foreign body   Alternatives discussed:  No treatment, delayed treatment and observation Anesthesia (see MAR for exact dosages):    Anesthesia method:  Local infiltration   Local anesthetic:  Lidocaine 1% w/o epi Laceration details:    Length (cm):  2 Exploration:    Hemostasis achieved with:  Direct pressure Treatment:    Area cleansed with:   Betadine   Amount of cleaning:  Extensive   Irrigation solution:  Sterile water   Irrigation method:  Syringe   Visualized foreign bodies/material removed: yes   Skin repair:    Repair method:  Sutures   Suture size:  5-0   Suture material:  Nylon   Number of sutures:  3 Approximation:    Approximation:  Close   (including critical care time)  Medications Ordered in ED Medications  lidocaine-EPINEPHrine (XYLOCAINE W/EPI) 2 %-1:200000 (PF) injection 10 mL (10 mLs Infiltration Given 07/08/19 1233)  oxyCODONE-acetaminophen (PERCOCET/ROXICET) 5-325 MG per tablet 1 tablet (1 tablet Oral Given 07/08/19 1233)     Initial Impression / Assessment and Plan / ED Course  I have reviewed the triage vital signs and the nursing notes.  Pertinent labs & imaging results that were available during my care of the patient were reviewed by me and considered in my medical decision making (see chart for details).        63 year old gentleman presents emergency department after suffering a laceration over his left wrist.  On exam, flexion extension intact in all fingers, good cap refill, good radial pulse however patient reported somewhat decreased sensation over the radial aspect of his thumb as well as the dorsum of his hand over second and third metacarpal bones.  Concerned about possible nerve injury.  I discussed the case in detail with Dr. Freada Bergeron on-call for hand surgery.  He recommended temporary primary closure and he would see patient in office later today or tomorrow.  Discussed plan with patient, patient agreeable.  Performed wound cleaning, remove small foreign body ~47mm, performed primary closure.     After the discussed management above, the patient was determined to be safe for discharge.  The patient was in agreement with this plan and all questions regarding their care were answered.  ED return precautions were discussed and the patient will return to the ED with any significant worsening of  condition.    Final Clinical Impressions(s) / ED Diagnoses   Final diagnoses:  Laceration of left wrist, initial encounter    ED Discharge Orders    None       Lucrezia Starch, MD 07/08/19 [phone]

## 2019-07-08 NOTE — Discharge Instructions (Signed)
Please call the hand surgery office to schedule appointment to be seen on later this afternoon or tomorrow.  Please keep the wound clean and dry as instructed.  If you develop new skin color changes, severe pain or swelling, recommend discussing with your hand surgeon or coming to the ER for reassessment.

## 2019-07-08 NOTE — ED Notes (Signed)
Patient transported to X-ray 

## 2019-07-08 NOTE — ED Notes (Addendum)
Left wrist inj w grinder  approx 1 inch lac bleeding controlled  Ring removed from ring finger  Given to wife

## 2019-07-09 ENCOUNTER — Telehealth: Payer: Self-pay | Admitting: Podiatry

## 2019-07-09 NOTE — Telephone Encounter (Signed)
Pt left message stating he picked up his shoes and inserts (1 right filler and 3 left inserts) but said he asked for 2 filler inserts so if we could order a second filler insert for him and call his wife Fernando Anthony) with any questions. Pt also said something about a 2nd pr of shoes but said not to worry about that at this time.  I left message for pts wife that generally insurance will not cover more than 1 filler insert and it has to be authorized but I would try and get that done asap and let her know.Marland Kitchen

## 2019-07-10 NOTE — Progress Notes (Signed)
   HPI: 63 y.o. male history of uncontrolled diabetes mellitus and transmetatarsal amputation right foot presenting today for follow up evaluation of an ulceration of the right foot. He reports constant pain. He notes associated swelling from the foot up to the thigh that began since his last visit. He has been taking Bactrim DS as prescribed. Patient is here for further evaluation and treatment.   Past Medical History:  Diagnosis Date  . Back pain   . Diabetes mellitus   . Headache(784.0)    general  . Hyperlipidemia   . Hypertension   . Osteomyelitis ([place]) 11/2018   RIGHT FOOT     Physical Exam: General: The patient is alert and oriented x3 in no acute distress.  Dermatology: Wound #1 noted to the right foot measuring 2.0 x 1.5 x 0.1 cm.   To the above-noted ulceration, there is no eschar. There is a moderate amount of slough, fibrin and necrotic tissue. Granulation tissue and wound base is red. There is no malodor. There is a minimal amount of serosanginous drainage noted. Periwound integrity is intact.  Skin is warm, dry and supple bilateral lower extremities.    Vascular: Palpable pedal pulses bilaterally. Capillary refill within normal limits.  Neurological: Epicritic and protective threshold grossly intact bilaterally.   Musculoskeletal Exam: History of transmetatarsal amputation right foot with routine healing. Erythema and edema noted to the right foot and ankle.   Assessment: 1. 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 History of transmetatarsal amputation right foot 3. Cellulitis right foot and ankle    Plan of Care:  1. Patient evaluated.   2. Medically necessary excisional debridement including subcutaneous tissue was performed using a tissue nipper.  Excisional debridement of all necrotic nonviable tissue down to healthy bleeding viable tissue was performed with post debridement measurement same as pre-. 3. Prescription for Gentamicin cream provided to  patient to use daily with a bandage.  4. Prescription for Cipro 500 mg BID based on C & S. Discontinue taking Bactrim DS.  5. Continue weightbearing in CAM boot.  6. Prescription for Percocet 10/325 mg provided to patient.  7. Return to clinic in one week.       Edrick Kins, DPM [facility]  Dr. Edrick Kins, DPM    [address], [place] [address]                Office [phone]  Fax [phone]

## 2019-07-15 ENCOUNTER — Ambulatory Visit (INDEPENDENT_AMBULATORY_CARE_PROVIDER_SITE_OTHER): Payer: BC Managed Care – PPO | Admitting: Podiatry

## 2019-07-15 ENCOUNTER — Other Ambulatory Visit: Payer: Self-pay

## 2019-07-15 ENCOUNTER — Encounter: Payer: Self-pay | Admitting: Podiatry

## 2019-07-15 VITALS — Temp 98.1°F

## 2019-07-15 DIAGNOSIS — E08621 Diabetes mellitus due to underlying condition with foot ulcer: Secondary | ICD-10-CM | POA: Diagnosis not present

## 2019-07-15 DIAGNOSIS — L03115 Cellulitis of right lower limb: Secondary | ICD-10-CM

## 2019-07-15 DIAGNOSIS — L97512 Non-pressure chronic ulcer of other part of right foot with fat layer exposed: Secondary | ICD-10-CM

## 2019-07-15 DIAGNOSIS — L97511 Non-pressure chronic ulcer of other part of right foot limited to breakdown of skin: Secondary | ICD-10-CM

## 2019-07-16 ENCOUNTER — Telehealth (HOSPITAL_COMMUNITY): Payer: Self-pay | Admitting: Rehabilitation

## 2019-07-16 ENCOUNTER — Telehealth: Payer: Self-pay | Admitting: *Deleted

## 2019-07-16 DIAGNOSIS — E08621 Diabetes mellitus due to underlying condition with foot ulcer: Secondary | ICD-10-CM

## 2019-07-16 DIAGNOSIS — L03115 Cellulitis of right lower limb: Secondary | ICD-10-CM

## 2019-07-16 DIAGNOSIS — R609 Edema, unspecified: Secondary | ICD-10-CM

## 2019-07-16 DIAGNOSIS — L97512 Non-pressure chronic ulcer of other part of right foot with fat layer exposed: Secondary | ICD-10-CM

## 2019-07-16 NOTE — Telephone Encounter (Signed)
Faxed required form, demographics to [facility]. 

## 2019-07-16 NOTE — Telephone Encounter (Signed)
I attempted to call the patient at all of the listed contact numbers to schedule "STAT" ultrasounds ordered by Dr. Jacqualyn Posey. I was unsuccessful reaching the patient at any contact number, but I did leave a message requesting that the patient return my call. [place]

## 2019-07-16 NOTE — Telephone Encounter (Signed)
[facility] - Fernando Anthony states she is able to schedule pt for venous doppler tomorrow, and arterial and ABI with TBI Thursday or Friday.

## 2019-07-16 NOTE — Telephone Encounter (Signed)
-----   Message from Edrick Kins, DPM sent at 07/15/2019  5:45 PM EDT ----- Regarding: Arterial and venous doppler RLE Please order Arterial and Venous doppler RLE.   Dx: DFU RT. Edema RLE.   Thanks, Dr. Amalia Hailey

## 2019-07-17 ENCOUNTER — Other Ambulatory Visit: Payer: Self-pay

## 2019-07-17 ENCOUNTER — Ambulatory Visit: Payer: BC Managed Care – PPO | Admitting: Podiatry

## 2019-07-17 ENCOUNTER — Ambulatory Visit (HOSPITAL_COMMUNITY)
Admission: RE | Admit: 2019-07-17 | Discharge: 2019-07-17 | Disposition: A | Discharge status: Home / Self Care | Payer: BC Managed Care – PPO | Source: Ambulatory Visit | Attending: Family | Admitting: Family

## 2019-07-17 DIAGNOSIS — L97511 Non-pressure chronic ulcer of other part of right foot limited to breakdown of skin: Secondary | ICD-10-CM | POA: Insufficient documentation

## 2019-07-17 DIAGNOSIS — M79676 Pain in unspecified toe(s): Secondary | ICD-10-CM

## 2019-07-17 DIAGNOSIS — L03115 Cellulitis of right lower limb: Secondary | ICD-10-CM | POA: Insufficient documentation

## 2019-07-17 DIAGNOSIS — E08621 Diabetes mellitus due to underlying condition with foot ulcer: Secondary | ICD-10-CM | POA: Diagnosis not present

## 2019-07-17 DIAGNOSIS — R609 Edema, unspecified: Secondary | ICD-10-CM | POA: Diagnosis present

## 2019-07-17 DIAGNOSIS — L97512 Non-pressure chronic ulcer of other part of right foot with fat layer exposed: Secondary | ICD-10-CM | POA: Insufficient documentation

## 2019-07-17 NOTE — Progress Notes (Signed)
   HPI: 63 y.o. male history of uncontrolled diabetes mellitus and transmetatarsal amputation right foot presenting today for follow up evaluation of an ulceration of the right foot. He reports swelling of the foot. He has been applying Betadine, taking Cipro and using the CAM boot as directed. There are no worsening factors noted at this time. Patient is here for further evaluation and treatment.   Past Medical History:  Diagnosis Date  . Back pain   . Diabetes mellitus   . Headache(784.0)    general  . Hyperlipidemia   . Hypertension   . Osteomyelitis ([place]) 11/2018   RIGHT FOOT     Physical Exam: General: The patient is alert and oriented x3 in no acute distress.  Dermatology: Wound #1 noted to the right foot measuring 1.5 x 1.5 x 0.1 cm.   To the above-noted ulceration, there is no eschar. There is a moderate amount of slough, fibrin and necrotic tissue. Granulation tissue and wound base is red. There is no malodor. There is a minimal amount of serosanginous drainage noted. Periwound integrity is intact.  Skin is warm, dry and supple bilateral lower extremities.    Vascular: Palpable pedal pulses bilaterally. Capillary refill within normal limits.  Neurological: Epicritic and protective threshold grossly intact bilaterally.   Musculoskeletal Exam: History of transmetatarsal amputation right foot with routine healing. Erythema and edema noted to the right foot and ankle.   Assessment: 1. 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 History of transmetatarsal amputation right foot 3. Cellulitis right foot and ankle    Plan of Care:  1. Patient evaluated.   2. Medically necessary excisional debridement including subcutaneous tissue was performed using a tissue nipper.  Excisional debridement of all necrotic nonviable tissue down to healthy bleeding viable tissue was performed with post debridement measurement same as pre-. 3. The wound was cleansed and a dry sterile  dressing applied.  4. Continue using Betadine daily with a dry sterile dressing.  5. Continue taking Cipro 500 mg as prescribed.  6. Continue weightbearing in CAM boot.  7. Orders for arterial and venous doppler of the RLE placed.  8. Return to clinic in 2 weeks.      Edrick Kins, DPM [facility]  Dr. Edrick Kins, DPM    [address], [place] [address]                Office [phone]  Fax [phone]

## 2019-07-17 NOTE — Telephone Encounter (Signed)
[facility] - Crystal transferred to Laqueta Carina informed that pt did not have a DVT.

## 2019-07-17 NOTE — Progress Notes (Signed)
Preliminary results reported to [facility] at 10:00 am

## 2019-07-22 ENCOUNTER — Ambulatory Visit (HOSPITAL_COMMUNITY)
Admission: RE | Admit: 2019-07-22 | Discharge: 2019-07-22 | Disposition: A | Discharge status: Home / Self Care | Payer: BC Managed Care – PPO | Source: Ambulatory Visit | Attending: Family | Admitting: Family

## 2019-07-22 ENCOUNTER — Ambulatory Visit (INDEPENDENT_AMBULATORY_CARE_PROVIDER_SITE_OTHER)
Admission: RE | Admit: 2019-07-22 | Discharge: 2019-07-22 | Disposition: A | Discharge status: Home / Self Care | Payer: BC Managed Care – PPO | Source: Ambulatory Visit | Attending: Family | Admitting: Family

## 2019-07-22 ENCOUNTER — Other Ambulatory Visit: Payer: Self-pay

## 2019-07-22 DIAGNOSIS — L97511 Non-pressure chronic ulcer of other part of right foot limited to breakdown of skin: Secondary | ICD-10-CM | POA: Insufficient documentation

## 2019-07-22 DIAGNOSIS — L97512 Non-pressure chronic ulcer of other part of right foot with fat layer exposed: Secondary | ICD-10-CM | POA: Diagnosis not present

## 2019-07-22 DIAGNOSIS — L03115 Cellulitis of right lower limb: Secondary | ICD-10-CM | POA: Diagnosis not present

## 2019-07-22 DIAGNOSIS — E08621 Diabetes mellitus due to underlying condition with foot ulcer: Secondary | ICD-10-CM | POA: Insufficient documentation

## 2019-07-22 DIAGNOSIS — R609 Edema, unspecified: Secondary | ICD-10-CM

## 2019-07-23 NOTE — Progress Notes (Signed)
Got it!     Thanks =).

## 2019-07-24 ENCOUNTER — Ambulatory Visit: Payer: BLUE CROSS/BLUE SHIELD | Admitting: Podiatry

## 2019-07-31 ENCOUNTER — Ambulatory Visit (INDEPENDENT_AMBULATORY_CARE_PROVIDER_SITE_OTHER): Payer: BC Managed Care – PPO | Admitting: Podiatry

## 2019-07-31 ENCOUNTER — Other Ambulatory Visit: Payer: Self-pay

## 2019-07-31 ENCOUNTER — Encounter: Payer: Self-pay | Admitting: Podiatry

## 2019-07-31 DIAGNOSIS — E08621 Diabetes mellitus due to underlying condition with foot ulcer: Secondary | ICD-10-CM

## 2019-07-31 DIAGNOSIS — M216X1 Other acquired deformities of right foot: Secondary | ICD-10-CM

## 2019-07-31 DIAGNOSIS — L97512 Non-pressure chronic ulcer of other part of right foot with fat layer exposed: Secondary | ICD-10-CM | POA: Diagnosis not present

## 2019-07-31 DIAGNOSIS — Z89411 Acquired absence of right great toe: Secondary | ICD-10-CM

## 2019-07-31 DIAGNOSIS — L97511 Non-pressure chronic ulcer of other part of right foot limited to breakdown of skin: Secondary | ICD-10-CM | POA: Diagnosis not present

## 2019-07-31 MED ORDER — SANTYL 250 UNIT/GM EX OINT: 1.0000 "application " | TOPICAL_OINTMENT | Freq: Every day | CUTANEOUS | 1 refills | Status: DC

## 2019-07-31 NOTE — Patient Instructions (Signed)
Pre-Operative Instructions  Congratulations, you have decided to take an important step towards improving your quality of life.  You can be assured that the doctors and staff at [facility] will be with you every step of the way.  Here are some important things you should know:  1. Plan to be at the surgery center/hospital at least 1 (one) hour prior to your scheduled time, unless otherwise directed by the surgical center/hospital staff.  You must have a responsible adult accompany you, remain during the surgery and drive you home.  Make sure you have directions to the surgical center/hospital to ensure you arrive on time. 2. If you are having surgery at [facility] or [facility], you will need a copy of your medical history and physical form from your family physician within one month prior to the date of surgery. We will give you a form for your primary physician to complete.  3. We make every effort to accommodate the date you request for surgery.  However, there are times where surgery dates or times have to be moved.  We will contact you as soon as possible if a change in schedule is required.   4. No aspirin/ibuprofen for one week before surgery.  If you are on aspirin, any non-steroidal anti-inflammatory medications (Mobic, Aleve, Ibuprofen) should not be taken seven (7) days prior to your surgery.  You make take Tylenol for pain prior to surgery.  5. Medications - If you are taking daily heart and blood pressure medications, seizure, reflux, allergy, asthma, anxiety, pain or diabetes medications, make sure you notify the surgery center/hospital before the day of surgery so they can tell you which medications you should take or avoid the day of surgery. 6. No food or drink after midnight the night before surgery unless directed otherwise by surgical center/hospital staff. 7. No alcoholic beverages 24-hours prior to surgery.  No smoking 24-hours prior or 24-hours after  surgery. 8. Wear loose pants or shorts. They should be loose enough to fit over bandages, boots, and casts. 9. Don't wear slip-on shoes. Sneakers are preferred. 10. Bring your boot with you to the surgery center/hospital.  Also bring crutches or a walker if your physician has prescribed it for you.  If you do not have this equipment, it will be provided for you after surgery. 11. If you have not been contacted by the surgery center/hospital by the day before your surgery, call to confirm the date and time of your surgery. 12. Leave-time from work may vary depending on the type of surgery you have.  Appropriate arrangements should be made prior to surgery with your employer. 13. Prescriptions will be provided immediately following surgery by your doctor.  Fill these as soon as possible after surgery and take the medication as directed. Pain medications will not be refilled on weekends and must be approved by the doctor. 14. Remove nail polish on the operative foot and avoid getting pedicures prior to surgery. 15. Wash the night before surgery.  The night before surgery wash the foot and leg well with water and the antibacterial soap provided. Be sure to pay special attention to beneath the toenails and in between the toes.  Wash for at least three (3) minutes. Rinse thoroughly with water and dry well with a towel.  Perform this wash unless told not to do so by your physician.  Enclosed: 1 Ice pack (please put in freezer the night before surgery)   1 Hibiclens skin cleaner     Pre-op instructions  If you have any questions regarding the instructions, please do not hesitate to call our office.  [place]: [address], [place], [place] [address] -- [phone]  [place]: [address] [place], [place] [address] -- [phone]  [place]: [address]  [place], [place] [address] -- [phone]  [place]: [address], [address], [place], [place] [address] -- [phone]  Website:  [email or web address] 

## 2019-08-02 ENCOUNTER — Telehealth: Payer: Self-pay | Admitting: *Deleted

## 2019-08-02 NOTE — Telephone Encounter (Signed)
DOS 08/12/2019 WOUND DEBRIDEMENT - 11043 AND TENDO-ACHILLES LENGTHENING - [id number] RIGHT FOOT  BCBS: Eligibility Date - 11/21/2018 - 11/22/2019   In-Network    Max Per Benefit Period Year-to-Date Remaining  CoInsurance  $2,800.00 $0.00  Deductible  $2,800.00 $0.00  Out-Of-Pocket  [id number]   Not Applicable  Active Coverage  Messages: THIS MEMBER CURRENTLY HAS AN HSA WITH A PAYMENT OPTION WHICH ALLOWS FOR AUTOMATIC PAYMENT DIRECTLY TO THE PROVIDER. QUALIFIED MEDICAL EXPENSES WITH THE EXCEPTION OF DENIED SERVICES ARE ELIGIBLE FOR REIMBURSEMENT. Provider: SEE PROVIDER SPECIALTY PE: [id number]

## 2019-08-06 NOTE — Progress Notes (Signed)
HPI: 63 y.o. male history of uncontrolled diabetes mellitus and transmetatarsal amputation right foot presenting today for follow up evaluation of an ulceration of the right foot.  Patient states that the pain and swelling has improved modestly.  He has been weightbearing in the immobilization cam boot and taking oral ciprofloxacin 500 mg as prescribed.  Patient also recently completed his arterial and venous studies to his right lower extremity which were within normal limits.  He is concerned due to history of previous amputation and concern for nonhealing ulcer.  No new complaints at this time.  Past Medical History:  Diagnosis Date  . Back pain   . Diabetes mellitus   . Headache(784.0)    general  . Hyperlipidemia   . Hypertension   . Osteomyelitis ([place]) 11/2018   RIGHT FOOT     Physical Exam: General: The patient is alert and oriented x3 in no acute distress.  Dermatology: Wound #1 noted to the right foot basically unchanged since last visit still measuring 1.5 x 1.5 x 0.1 cm.   To the above-noted ulceration, there is no eschar. There is a moderate amount of slough, fibrin and necrotic tissue. Granulation tissue and wound base is red. There is no malodor. There is a minimal amount of serosanginous drainage noted. Periwound integrity is intact.  Skin is warm, dry and supple bilateral lower extremities.    Vascular: Palpable pedal pulses bilaterally. Capillary refill within normal limits.  There continues to be some moderate edema to the amputation foot  Neurological: Epicritic and protective threshold grossly intact bilaterally.   Musculoskeletal Exam: History of transmetatarsal amputation right foot with routine healing. Erythema and edema noted to the right foot and ankle.  There is some limited dorsiflexion with the ankle joint likely contributory to the pressure ulcer to the distal stump of the amputation foot  Assessment: 1. Ulcer right foot secondary to diabetes mellitus  2. History of transmetatarsal amputation right foot 3. Cellulitis right foot and ankle-improved 4.  Equinus right lower extremity   Plan of Care:  1. Patient evaluated.   2. Medically necessary excisional debridement including subcutaneous tissue was performed using a tissue nipper.  Excisional debridement of all necrotic nonviable tissue down to healthy bleeding viable tissue was performed with post debridement measurement same as pre-. 3. The wound was cleansed and a dry sterile dressing applied.  4. Continue using Betadine daily with a dry sterile dressing.  5.  Finish oral Cipro 500 mg as prescribed.  6. Continue weightbearing in CAM boot.  7.  I did explain to the patient that he may likely benefit from tendo Achilles lengthening of his right lower extremity to alleviate pressure from the distal stump of the amputation foot.  Patient agrees.  All possible complications and details the procedure were explained.  No guarantees were expressed or implied.   8.  Diabetic insoles with toe fillers x2 were dispensed also for the right foot only.   9.  Today's authorization for surgery was initiated today.  Surgery will consist of tendo Achilles lengthening right. 10.  Return to clinic 1 week postop   Edrick Kins, DPM [facility]  Dr. Edrick Kins, DPM    [address], [place] [address]  Office [phone]  Fax [phone]

## 2019-08-12 ENCOUNTER — Encounter: Payer: Self-pay | Admitting: Podiatry

## 2019-08-12 ENCOUNTER — Other Ambulatory Visit: Payer: Self-pay | Admitting: Podiatry

## 2019-08-12 DIAGNOSIS — M216X1 Other acquired deformities of right foot: Secondary | ICD-10-CM

## 2019-08-12 DIAGNOSIS — E11621 Type 2 diabetes mellitus with foot ulcer: Secondary | ICD-10-CM | POA: Diagnosis not present

## 2019-08-12 MED ORDER — OXYCODONE-ACETAMINOPHEN 10-325 MG PO TABS: 1.0000 | ORAL_TABLET | Freq: Four times a day (QID) | ORAL | 0 refills | Status: AC | PRN

## 2019-08-12 NOTE — Progress Notes (Signed)
.  postop

## 2019-08-15 ENCOUNTER — Telehealth: Payer: Self-pay | Admitting: *Deleted

## 2019-08-15 NOTE — Telephone Encounter (Signed)
Pt's wife, Con Memos states pt had surgery last Monday 08/12/2019 and has an appt 08/19/2019. Con Memos states she has had a family member death in [place] and pt would like to accompany her on the airplane leaving 08/22/2019.

## 2019-08-16 NOTE — Telephone Encounter (Signed)
That is okay. I'll give him a call today with instructions. - Dr. Amalia Hailey

## 2019-08-16 NOTE — Telephone Encounter (Signed)
Left message informing pt's wife, Con Memos that Dr. Amalia Hailey had said it would be okay and he would call pt with instructions.

## 2019-08-19 ENCOUNTER — Ambulatory Visit (INDEPENDENT_AMBULATORY_CARE_PROVIDER_SITE_OTHER): Payer: BC Managed Care – PPO | Admitting: Podiatry

## 2019-08-19 ENCOUNTER — Other Ambulatory Visit: Payer: Self-pay

## 2019-08-19 DIAGNOSIS — E08621 Diabetes mellitus due to underlying condition with foot ulcer: Secondary | ICD-10-CM

## 2019-08-19 DIAGNOSIS — L97511 Non-pressure chronic ulcer of other part of right foot limited to breakdown of skin: Secondary | ICD-10-CM

## 2019-08-19 DIAGNOSIS — M216X1 Other acquired deformities of right foot: Secondary | ICD-10-CM

## 2019-08-19 DIAGNOSIS — L03115 Cellulitis of right lower limb: Secondary | ICD-10-CM

## 2019-08-19 DIAGNOSIS — L97512 Non-pressure chronic ulcer of other part of right foot with fat layer exposed: Secondary | ICD-10-CM

## 2019-08-19 MED ORDER — SANTYL 250 UNIT/GM EX OINT: 1.0000 "application " | TOPICAL_OINTMENT | Freq: Every day | CUTANEOUS | 3 refills | Status: DC

## 2019-08-19 MED ORDER — OXYCODONE-ACETAMINOPHEN 10-325 MG PO TABS: 1.0000 | ORAL_TABLET | Freq: Three times a day (TID) | ORAL | 0 refills | Status: AC | PRN

## 2019-08-19 MED ORDER — DOXYCYCLINE HYCLATE 100 MG PO TABS: 100.0000 mg | ORAL_TABLET | Freq: Two times a day (BID) | ORAL | 0 refills | Status: DC

## 2019-08-19 NOTE — Progress Notes (Signed)
   Subjective:  Patient presents today status post tendo Achilles lengthening right. DOS: 08/12/2019. He reports some mild Achilles pain. He denies much pain or change of the ulceration of the right foot. He has been taking Percocet for pain and using the CAM boot as directed. He denies any known worsening factors. Patient is here for further evaluation and treatment.    Past Medical History:  Diagnosis Date  . Back pain   . Diabetes mellitus   . Headache(784.0)    general  . Hyperlipidemia   . Hypertension   . Osteomyelitis ([place]) 11/2018   RIGHT FOOT      Objective/Physical Exam Neurovascular status intact.  Skin incisions appear to be well coapted with sutures and staples intact. Increased erythema and edema noted to the right foot and ankle. No dehiscence. No active bleeding noted. Moderate edema noted to the surgical extremity.  Wound #1 of the right foot measuring 1.5 x 1.5 x 0.1 cm.   To the above-noted ulceration, there is no eschar. There is a moderate amount of slough, fibrin and necrotic tissue. Granulation tissue and wound base is red. There is no malodor. There is a minimal amount of serosanginous drainage noted. Periwound integrity is intact.    Assessment: 1. s/p tendo Achilles lengthening right. DOS: 08/12/2019 2. Ulcer right foot secondary to diabetes mellitus 3. History of transmetatarsal amputation right foot 4. Cellulitis right foot and ankle - recurrent    Plan of Care:  1. Patient was evaluated. 2. Dressing changed.  3. Continue using CAM boot.  4. Prescription for Doxycycline provided to patient.  5. Refill prescription for Percocet 10/325 mg provided to patient.  6. Return to clinic in one week for suture removal.    Edrick Kins, DPM [facility]  Dr. Edrick Kins, [address]                                        [place], [place] [address]                Office [phone]  Fax [phone]

## 2019-08-26 ENCOUNTER — Ambulatory Visit (INDEPENDENT_AMBULATORY_CARE_PROVIDER_SITE_OTHER): Payer: BC Managed Care – PPO | Admitting: Podiatry

## 2019-08-26 ENCOUNTER — Other Ambulatory Visit: Payer: Self-pay

## 2019-08-26 DIAGNOSIS — L97512 Non-pressure chronic ulcer of other part of right foot with fat layer exposed: Secondary | ICD-10-CM

## 2019-08-26 DIAGNOSIS — R609 Edema, unspecified: Secondary | ICD-10-CM | POA: Diagnosis not present

## 2019-08-26 DIAGNOSIS — E08621 Diabetes mellitus due to underlying condition with foot ulcer: Secondary | ICD-10-CM

## 2019-08-26 DIAGNOSIS — L97511 Non-pressure chronic ulcer of other part of right foot limited to breakdown of skin: Secondary | ICD-10-CM

## 2019-08-26 DIAGNOSIS — Z09 Encounter for follow-up examination after completed treatment for conditions other than malignant neoplasm: Secondary | ICD-10-CM

## 2019-08-29 NOTE — Progress Notes (Signed)
   Subjective:  Patient presents today status post tendo Achilles lengthening right. DOS: 08/12/2019. He states he is doing well and has been healing since his previous appointment. He reports some continue tenderness and is concerned about his balance. He has been taking the Doxycycline and Percocet as directed. There are no modifying factors noted. Patient is here for further evaluation and treatment.    Past Medical History:  Diagnosis Date  . Back pain   . Diabetes mellitus   . Headache(784.0)    general  . Hyperlipidemia   . Hypertension   . Osteomyelitis ([place]) 11/2018   RIGHT FOOT      Objective/Physical Exam Neurovascular status intact.  Skin incisions appear to be well coapted with sutures and staples intact. No dehiscence. No active bleeding noted. No signs of infection. Edema noted to the right lower extremity.  Wound #1 of the right foot measuring 1.5 x 1.5 x 0.1 cm.   To the above-noted ulceration, there is no eschar. There is a moderate amount of slough, fibrin and necrotic tissue. Granulation tissue and wound base is red. There is no malodor. There is a minimal amount of serosanginous drainage noted. Periwound integrity is intact.    Assessment: 1. s/p tendo Achilles lengthening right. DOS: 08/12/2019 2. Ulcer right foot secondary to diabetes mellitus 3. History of transmetatarsal amputation right foot 4. Edema RLE    Plan of Care:  1. Patient was evaluated. 2. Sutures removed.  3. Unna boot applied to RLE.  4. Continue weightbearing in CAM boot.  5. Return to clinic in one week.    Edrick Kins, DPM [facility]  Dr. Edrick Kins, [address]                                        [place], [place] [address]                Office [phone]  Fax [phone]

## 2019-09-04 ENCOUNTER — Ambulatory Visit (INDEPENDENT_AMBULATORY_CARE_PROVIDER_SITE_OTHER): Payer: BC Managed Care – PPO | Admitting: Podiatry

## 2019-09-04 ENCOUNTER — Other Ambulatory Visit: Payer: Self-pay

## 2019-09-04 DIAGNOSIS — E08621 Diabetes mellitus due to underlying condition with foot ulcer: Secondary | ICD-10-CM

## 2019-09-04 DIAGNOSIS — L97512 Non-pressure chronic ulcer of other part of right foot with fat layer exposed: Secondary | ICD-10-CM

## 2019-09-04 DIAGNOSIS — L97511 Non-pressure chronic ulcer of other part of right foot limited to breakdown of skin: Secondary | ICD-10-CM

## 2019-09-04 DIAGNOSIS — Z09 Encounter for follow-up examination after completed treatment for conditions other than malignant neoplasm: Secondary | ICD-10-CM

## 2019-09-09 NOTE — Progress Notes (Signed)
   Subjective:  Patient presents today status post tendo Achilles lengthening right. DOS: 08/12/2019. He is also here for follow up evaluation of an ulceration of the right foot. He states the wound is improving. He states the surgical site is healing well and denies any pain or modifying factors. He has been using the CAM boot as directed. Patient is here for further evaluation and treatment.    Past Medical History:  Diagnosis Date  . Back pain   . Diabetes mellitus   . Headache(784.0)    general  . Hyperlipidemia   . Hypertension   . Osteomyelitis ([place]) 11/2018   RIGHT FOOT      Objective/Physical Exam Neurovascular status intact.  Skin incisions appear to be well coapted. No dehiscence. No active bleeding noted. No signs of infection.  Wound #1 of the right foot measuring 1.5 x 1.5 x 0.1 cm.   To the above-noted ulceration, there is no eschar. There is a moderate amount of slough, fibrin and necrotic tissue. Granulation tissue and wound base is red. There is no malodor. There is a minimal amount of serosanginous drainage noted. Periwound integrity is intact.    Assessment: 1. s/p tendo Achilles lengthening right. DOS: 08/12/2019 2. Ulcer right foot secondary to diabetes mellitus 3. History of transmetatarsal amputation right foot 4. Edema RLE - resolved    Plan of Care:  1. Patient was evaluated. 2. Medically necessary excisional debridement including subcutaneous tissue was performed using a tissue nipper and a chisel blade. Excisional debridement of all the necrotic nonviable tissue down to healthy bleeding viable tissue was performed with post-debridement measurements same as pre-. 3. The wound was cleansed and dry sterile dressing applied. 4. Recommended Santyl ointment daily.  5. Continue using CAM boot.  6. Return to clinic in 3 weeks.    Edrick Kins, DPM [facility]  Dr. Edrick Kins, [address]                                         [place], [place] [address]                Office [phone]  Fax [phone]

## 2019-09-25 ENCOUNTER — Ambulatory Visit (INDEPENDENT_AMBULATORY_CARE_PROVIDER_SITE_OTHER): Payer: BC Managed Care – PPO | Admitting: Podiatry

## 2019-09-25 ENCOUNTER — Other Ambulatory Visit: Payer: Self-pay

## 2019-09-25 DIAGNOSIS — Z09 Encounter for follow-up examination after completed treatment for conditions other than malignant neoplasm: Secondary | ICD-10-CM

## 2019-09-25 DIAGNOSIS — E08621 Diabetes mellitus due to underlying condition with foot ulcer: Secondary | ICD-10-CM

## 2019-09-25 DIAGNOSIS — L97511 Non-pressure chronic ulcer of other part of right foot limited to breakdown of skin: Secondary | ICD-10-CM

## 2019-09-25 DIAGNOSIS — L97512 Non-pressure chronic ulcer of other part of right foot with fat layer exposed: Secondary | ICD-10-CM | POA: Diagnosis not present

## 2019-09-30 NOTE — Progress Notes (Signed)
   Subjective:  Patient presents today status post tendo Achilles lengthening right. DOS: 08/12/2019. He states he is doing well. He reports intermittent mild pain. He has been using the CAM boot as needed which helps alleviate any symptoms. He has been applying Santyl ointment to the ulceration. He denies worsening factors of the wound. Patient is here for further evaluation and treatment.    Past Medical History:  Diagnosis Date  . Back pain   . Diabetes mellitus   . Headache(784.0)    general  . Hyperlipidemia   . Hypertension   . Osteomyelitis ([place]) 11/2018   RIGHT FOOT      Objective/Physical Exam Neurovascular status intact.  Skin incisions appear to be well coapted. No dehiscence. No active bleeding noted. No signs of infection.  Wound #1 of the right foot measuring 1.3 x 1.3 x 0.2 cm.   To the above-noted ulceration, there is no eschar. There is a moderate amount of slough, fibrin and necrotic tissue. Granulation tissue and wound base is red. There is no malodor. There is a minimal amount of serosanginous drainage noted. Periwound integrity is intact.    Assessment: 1. s/p tendo Achilles lengthening right. DOS: 08/12/2019 2. Ulcer right foot secondary to diabetes mellitus 3. History of transmetatarsal amputation right foot 4. Edema RLE - resolved    Plan of Care:  1. Patient was evaluated. 2. Medically necessary excisional debridement including subcutaneous tissue was performed using a tissue nipper and a chisel blade. Excisional debridement of all the necrotic nonviable tissue down to healthy bleeding viable tissue was performed with post-debridement measurements same as pre-. 3. The wound was cleansed and dry sterile dressing applied. 4. Continue using Santyl ointment daily.  5. Continue using CAM boot.  6. Return to clinic in 4 weeks.    Edrick Kins, DPM [facility]  Dr. Edrick Kins, [address]                                         [place], [place] [address]                Office [phone]  Fax [phone]

## 2019-10-23 ENCOUNTER — Other Ambulatory Visit: Payer: Self-pay

## 2019-10-23 ENCOUNTER — Ambulatory Visit (INDEPENDENT_AMBULATORY_CARE_PROVIDER_SITE_OTHER): Payer: BC Managed Care – PPO | Admitting: Podiatry

## 2019-10-23 DIAGNOSIS — E08621 Diabetes mellitus due to underlying condition with foot ulcer: Secondary | ICD-10-CM | POA: Diagnosis not present

## 2019-10-23 DIAGNOSIS — L97512 Non-pressure chronic ulcer of other part of right foot with fat layer exposed: Secondary | ICD-10-CM | POA: Diagnosis not present

## 2019-10-23 DIAGNOSIS — L97511 Non-pressure chronic ulcer of other part of right foot limited to breakdown of skin: Secondary | ICD-10-CM | POA: Diagnosis not present

## 2019-10-29 NOTE — Progress Notes (Signed)
   Subjective:  Patient presents today status post tendo Achilles lengthening right. DOS: 08/12/2019. He states he is doing well and improving. He reports some intermittent pain and reports bruising when he wears a sock. He has been using Santyl daily for treatment. Patient is here for further evaluation and treatment.    Past Medical History:  Diagnosis Date  . Back pain   . Diabetes mellitus   . Headache(784.0)    general  . Hyperlipidemia   . Hypertension   . Osteomyelitis ([place]) 11/2018   RIGHT FOOT      Objective/Physical Exam Neurovascular status intact.  Skin incisions appear to be well coapted. No dehiscence. No active bleeding noted. No signs of infection.  Wound #1 of the right foot measuring 0.7 x 0.7 x 0.2 cm.   To the above-noted ulceration, there is no eschar. There is a moderate amount of slough, fibrin and necrotic tissue. Granulation tissue and wound base is red. There is no malodor. There is a minimal amount of serosanginous drainage noted. Periwound integrity is intact.    Assessment: 1. s/p tendo Achilles lengthening right. DOS: 08/12/2019 2. Ulcer right foot secondary to diabetes mellitus 3. History of transmetatarsal amputation right foot 4. Edema RLE - resolved    Plan of Care:  1. Patient was evaluated. 2. Medically necessary excisional debridement including subcutaneous tissue was performed using a tissue nipper and a chisel blade. Excisional debridement of all the necrotic nonviable tissue down to healthy bleeding viable tissue was performed with post-debridement measurements same as pre-. 3. The wound was cleansed and dry sterile dressing applied. 4. Continue using Santyl ointment daily.  5. Return to clinic in 4 weeks.    Edrick Kins, DPM [facility]  Dr. Edrick Kins, [address]                                        [place], [place] [address]                Office [phone]  Fax [phone]

## 2019-11-20 ENCOUNTER — Ambulatory Visit (INDEPENDENT_AMBULATORY_CARE_PROVIDER_SITE_OTHER): Payer: BC Managed Care – PPO | Admitting: Podiatry

## 2019-11-20 ENCOUNTER — Other Ambulatory Visit: Payer: Self-pay

## 2019-11-20 DIAGNOSIS — E08621 Diabetes mellitus due to underlying condition with foot ulcer: Secondary | ICD-10-CM

## 2019-11-20 DIAGNOSIS — L97512 Non-pressure chronic ulcer of other part of right foot with fat layer exposed: Secondary | ICD-10-CM

## 2019-11-20 DIAGNOSIS — L97511 Non-pressure chronic ulcer of other part of right foot limited to breakdown of skin: Secondary | ICD-10-CM

## 2019-11-27 NOTE — Progress Notes (Signed)
   Subjective:  Patient presents today status post tendo Achilles lengthening right. DOS: 08/12/2019. He is here for follow up evaluation of an ulceration of the right foot. He states he is doing well. He denies any pain or modifying factors. He has been using Santyl ointment for treatment. Patient is here for further evaluation and treatment as directed. Patient is here for further evaluation and treatment.     Past Medical History:  Diagnosis Date  . Back pain   . Diabetes mellitus   . Headache(784.0)    general  . Hyperlipidemia   . Hypertension   . Osteomyelitis ([place]) 11/2018   RIGHT FOOT      Objective/Physical Exam Neurovascular status intact.  Skin incisions appear to be well coapted. No dehiscence. No active bleeding noted. No signs of infection.  Wound #1 of the right foot measuring 1.0 x 1.0 x 0.3 cm.   To the above-noted ulceration, there is no eschar. There is a moderate amount of slough, fibrin and necrotic tissue. Granulation tissue and wound base is red. There is no malodor. There is a minimal amount of serosanginous drainage noted. Periwound integrity is intact.    Assessment: 1. s/p tendo Achilles lengthening right. DOS: 08/12/2019 2. Ulcer right foot secondary to diabetes mellitus 3. History of transmetatarsal amputation right foot 4. Edema RLE - resolved    Plan of Care:  1. Patient was evaluated. 2. Medically necessary excisional debridement including subcutaneous tissue was performed using a tissue nipper and a chisel blade. Excisional debridement of all the necrotic nonviable tissue down to healthy bleeding viable tissue was performed with post-debridement measurements same as pre-. 3. The wound was cleansed and dry sterile dressing applied. 4. Discontinue using Santyl ointment.  5. Prisma provided to apply daily.  6. Continue wearing DM shoes.  7. Return to clinic in 4 weeks.    Edrick Kins, DPM [facility]  Dr. Edrick Kins, [address]                                        [place], [place] [address]                Office [phone]  Fax [phone]

## 2019-11-29 ENCOUNTER — Emergency Department (HOSPITAL_COMMUNITY)
Admission: EM | Admit: 2019-11-29 | Discharge: 2019-11-30 | Disposition: A | Discharge status: Home / Self Care | Payer: BC Managed Care – PPO | Attending: Emergency Medicine | Admitting: Emergency Medicine

## 2019-11-29 ENCOUNTER — Other Ambulatory Visit: Payer: Self-pay

## 2019-11-29 ENCOUNTER — Encounter (HOSPITAL_COMMUNITY): Payer: Self-pay | Admitting: *Deleted

## 2019-11-29 DIAGNOSIS — Z5321 Procedure and treatment not carried out due to patient leaving prior to being seen by health care provider: Secondary | ICD-10-CM | POA: Diagnosis not present

## 2019-11-29 DIAGNOSIS — R569 Unspecified convulsions: Secondary | ICD-10-CM | POA: Diagnosis not present

## 2019-11-29 DIAGNOSIS — F1110 Opioid abuse, uncomplicated: Secondary | ICD-10-CM | POA: Diagnosis not present

## 2019-11-29 LAB — CBC
HCT: 41.6 % (ref 39.0–52.0)
Hemoglobin: 13.6 g/dL (ref 13.0–17.0)
MCH: 31.3 pg (ref 26.0–34.0)
MCHC: 32.7 g/dL (ref 30.0–36.0)
MCV: 95.9 fL (ref 80.0–100.0)
Platelets: 202 10*3/uL (ref 150–400)
RBC: 4.34 MIL/uL (ref 4.22–5.81)
RDW: 12.9 % (ref 11.5–15.5)
WBC: 12.4 10*3/uL — ABNORMAL HIGH (ref 4.0–10.5)
nRBC: 0 % (ref 0.0–0.2)

## 2019-11-29 LAB — BASIC METABOLIC PANEL
Anion gap: 4 — ABNORMAL LOW (ref 5–15)
BUN: 24 mg/dL — ABNORMAL HIGH (ref 8–23)
CO2: 30 mmol/L (ref 22–32)
Calcium: 7.9 mg/dL — ABNORMAL LOW (ref 8.9–10.3)
Chloride: 98 mmol/L (ref 98–111)
Creatinine, Ser: 1.01 mg/dL (ref 0.61–1.24)
GFR calc Af Amer: >60 mL/min (ref >60)
GFR calc non Af Amer: >60 mL/min (ref >60)
Glucose, Bld: 394 mg/dL — ABNORMAL HIGH (ref 70–99)
Potassium: 3.9 mmol/L (ref 3.5–5.1)
Sodium: 132 mmol/L — ABNORMAL LOW (ref 135–145)

## 2019-11-29 NOTE — ED Triage Notes (Signed)
Pt arrives via [facility] from home with c/o wife called out for possible seizure. Walked to the living room, sat on the couch, stared off and then had 5 minutes of full body convulsions. No history of seizures. Appeared post ictal on initial arrival to the scene. 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 He did admit to oxy use today. CBG 458, hx of non compliance with meds. 200/110, hr 98, r 18, 95% RA. IV established in the left acc, 500cc NS given en route.   Pt wife can be contacted (terry Bayron at  [phone])

## 2019-11-29 NOTE — ED Triage Notes (Signed)
Pt says he just fell asleep and he does not know what happens. Admits to snorting oxy today. Denies any further symptoms at this time. Says he is compliant with his diabetes and htn medications. Alert/oriented, pinpoint pupils.

## 2019-11-30 NOTE — ED Notes (Addendum)
Pt has decided to leave AMA. IV taken out of lt ac.

## 2019-12-06 ENCOUNTER — Telehealth (HOSPITAL_COMMUNITY): Payer: Self-pay | Admitting: Psychiatry

## 2019-12-17 ENCOUNTER — Other Ambulatory Visit: Payer: Self-pay

## 2019-12-17 ENCOUNTER — Ambulatory Visit (INDEPENDENT_AMBULATORY_CARE_PROVIDER_SITE_OTHER): Payer: BC Managed Care – PPO | Admitting: Licensed Clinical Social Worker

## 2019-12-17 DIAGNOSIS — F1110 Opioid abuse, uncomplicated: Secondary | ICD-10-CM

## 2019-12-17 NOTE — Progress Notes (Signed)
Comprehensive Clinical Assessment (CCA) Note  12/17/2019 Fernando Anthony [id number]  Visit Diagnosis:      ICD-10-CM   1. 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  (See scanned document in Chart Review)  CCA Part Two A  Intake/Chief Complaint:  CCA Intake With Chief Complaint CCA Part Two Date: 12/17/19 Chief Complaint/Presenting Problem: opioid use disorder, client has seizure 3 weeks ago and was recently diagnosed with congestive heart failure, stating he does not need opioid use problem in addition to health concerns. Patients Currently Reported Symptoms/Problems: Client denies mental health concerns separate of lack of motivation which he attributes to stopping substances and recent dx heart problems in addition to diabeties Collateral Involvement: EHR Individual's Strengths: desire to maintain sobriety Individual's Preferences: individual therapy, suboxone through PCP Type of Services Patient Feels Are Needed: individual therapy, suboxone through PCP, education related to addiction Initial Clinical Notes/Concerns: Client requesting support to maintain sobriety and learn and use coping skills. Client reports problematic opioid use for the pat 10 years starting with back surgery. Client continued taking pain medication from surgeon for 5 years, another provider for 5 years. Client did engage in suboxone program 2-3 years ago and was compliant for 1 year. Client stayed sober for a few months after competion of methadone program.  Mental Health Symptoms Depression:  Depression: N/A(some changes in sleep being addressed by PCP)  Mania:  Mania: N/A  Anxiety:   Anxiety: N/A  Psychosis:  Psychosis: N/A  Trauma:  Trauma: N/A  Obsessions:  Obsessions: N/A  Compulsions:  Compulsions: N/A  Inattention:  Inattention: N/A  Hyperactivity/Impulsivity:  Hyperactivity/Impulsivity: N/A   Oppositional/Defiant Behaviors:  Oppositional/Defiant Behaviors: N/A  Borderline Personality:  Emotional Irregularity: N/A  Other Mood/Personality Symptoms:      Mental Status Exam Appearance and self-care  Stature:  Stature: Average  Weight:  Weight: Average weight  Clothing:  Clothing: Casual  Grooming:  Grooming: Normal  Cosmetic use:  Cosmetic Use: None  Posture/gait:  Posture/Gait: Normal  Motor activity:  Motor Activity: Not Remarkable  Sensorium  Attention:  Attention: Normal  Concentration:  Concentration: Normal  Orientation:  Orientation: X5  Recall/memory:  Recall/Memory: Normal  Affect and Mood  Affect:  Affect: Appropriate  Mood:  Mood: Euthymic  Relating  Eye contact:  Eye Contact: Normal  Facial expression:  Facial Expression: Responsive  Attitude toward examiner:  Attitude Toward Examiner: Cooperative  Thought and Language  Speech flow: Speech Flow: Normal  Thought content:  Thought Content: Appropriate to mood and circumstances  Preoccupation:     Hallucinations:  Hallucinations: (none)  Organization:     Transport planner of Knowledge:  Fund of Knowledge: Average  Intelligence:  Intelligence: Average  Abstraction:  Abstraction: Normal  Judgement:  Judgement: Normal  Reality Testing:  Reality Testing: Adequate  Insight:  Insight: Good  Decision Making:  Decision Making: Normal  Social Functioning  Social Maturity:  Social Maturity: Responsible  Social Judgement:  Social Judgement: Normal  Stress  Stressors:  Stressors: Family conflict  Coping Ability:  Coping Ability: Normal  Skill Deficits:     Supports:      Family and Psychosocial History: Family history Marital status: Married Number of Years Married: 54 What types of issues is patient dealing with in the relationship?: wife had some concerns about health and finances when client using substances. reports currently feeling supported  by his wife Additional relationship information:  current 2nd marriage- divorce from first. Does patient have children?: Yes How many children?: 2 How is patient's relationship with their children?: positive relationship with son who lives in [place] and daughter who lives locally and 5 grandchildren. daughter is nurse and was assisting client find SA tx program following seizure of client reported unknown origin, denies it was related to w/d  Childhood History:  Childhood History By whom was/is the patient raised?: Both parents Additional childhood history information: positive 'good childhood' 'firm and religions' Description of patient's relationship with caregiver when they were a child: mother stay at home mom, father police officer How were you disciplined when you got in trouble as a child/adolescent?: appropriate Does patient have siblings?: Yes Number of Siblings: 3 Description of patient's current relationship with siblings: supportive relationship Did patient suffer any verbal/emotional/physical/sexual abuse as a child?: No Did patient suffer from severe childhood neglect?: No Has patient ever been sexually abused/assaulted/raped as an adolescent or adult?: No Was the patient ever a victim of a crime or a disaster?: No Witnessed domestic violence?: No Has patient been effected by domestic violence as an adult?: No  CCA Part Two B  Employment/Work Situation: Employment / Work Situation Employment situation: Retired Why is patient on disability: retired january 2020 How long has patient been on disability: right food amputated due to diabeties; approved september 2020 Patient's job has been impacted by current illness: Yes Describe how patient's job has been impacted: belives was not impacted What is the longest time patient has a held a job?: 21 years 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 Did You Receive Any Psychiatric Treatment/Services While in Passenger transport manager?: No Are There Guns or Other  Weapons in [place]?: Yes Are These Weapons Safely Secured?: Yes  Education: Education Last Grade Completed: 12 Did Teacher, adult education From [employer]?: Yes Did Physicist, medical?: No Did Heritage manager?: No Did You Have An Individualized Education Program (IIEP): No Did You Have Any Difficulty At [employer]?: No  Religion: Religion/Spirituality Are You A Religious Person?: Yes  Leisure/Recreation: Leisure / Recreation Leisure and Hobbies: Health visitor, building things, gardening, fishing  Exercise/Diet: Exercise/Diet Do You Exercise?: No Have You Gained or Lost A Significant Amount of Weight in the Past Six Months?: No Do You Follow a Special Diet?: No Do You Have Any Trouble Sleeping?: Yes  CCA Part Two C  Alcohol/Drug Use: Alcohol / Drug Use Pain Medications: hx abuse of pain medication Prescriptions: diabeties current, possible heart meds soon not abused History of alcohol / drug use?: Yes Longest period of sobriety (when/how long): 1.5 years; 2-3 years ago Negative Consequences of Use: Financial Withdrawal Symptoms: Agitation, Irritability, Nausea / Vomiting, Patient aware of relationship between substance abuse and physical/medical complications, Weakness, Sweats, Fever / Chills Substance #1 Name of Substance 1: opioids 1 - Age of First Use: 54 1 - Amount (size/oz): 8-10 10mg  pills 1 - Frequency: daily 1 - Duration: 10 years total 1 - Last Use / Amount: 3 weeks     Hx of alcohol use, some binging. Last use 5-7 years ago. Denies ongoing drinking.   CCA Part Three  ASAM's:  Six Dimensions of Multidimensional Assessment  Dimension 1:  Acute Intoxication and/or Withdrawal Potential:  Dimension 1:  Comments: 2 currently prescribed suboxone by PCP  Dimension 2:  Biomedical Conditions and Complications:  Dimension 2:  Comments: 2 diabeties previously requiring surgery  Dimension 3:  Emotional, Behavioral,  or Cognitive Conditions and Complications:   Dimension 3:  Comments: 1  Dimension 4:  Readiness to Change:  Dimension 4:  Comments: 1  Dimension 5:  Relapse, Continued use, or Continued Problem Potential:  Dimension 5:  Comments: 2  Dimension 6:  Recovery/Living Environment:  Dimension 6:  Recovery/Living Environment Comments: 1   Substance use Disorder (SUD) Substance Use Disorder (SUD)  Checklist Symptoms of Substance Use: Continued use despite having a persistent/recurrent physical/psychological problem caused/exacerbated by use, Substance(s) often taken in large amounts or over longer times than was intended, Repeated use in physically hazardous situations, Presence of craving or strong urge to use, Evidence of withdrawal (Comment), Evidence of tolerance  Social Function:  Social Functioning Social Maturity: Responsible Social Judgement: Normal  Stress:  Stress Stressors: Family conflict Coping Ability: Normal Patient Takes Medications The Way The Doctor Instructed?: Yes Priority Risk: Moderate Risk  Risk Assessment- Self-Harm Potential: Risk Assessment For Self-Harm Potential Thoughts of Self-Harm: No current thoughts Method: No plan Availability of Means: No access/NA  Risk Assessment -Dangerous to Others Potential: Risk Assessment For Dangerous to Others Potential Method: No Plan Availability of Means: No access or NA Intent: Vague intent or NA Notification Required: No need or identified person  DSM5 Diagnoses: Patient Active Problem List   Diagnosis Date Noted  . History of transmetatarsal amputation of right foot ([place]) 06/11/2019  . Neuropathic pain of foot, right 06/11/2019  . Memory difficulties 01/18/2019  . Psychophysiological insomnia 01/18/2019  . Hyperlipidemia associated with type 2 diabetes mellitus ([place]) 12/13/2018  . Hypertension associated with type 2 diabetes mellitus ([place]) 12/13/2018  . Partial nontraumatic amputation of right foot ([place]) 12/13/2018  . Osteomyelitis of right foot ([place]) 12/04/2018   . Hypertensive urgency 12/04/2018  . Abnormal LFTs 12/04/2018  . Hyponatremia 12/04/2018  . Osteomyelitis ([place]) 12/04/2018  . Hypertension, essential, benign 08/20/2017  . Hypokalemia 08/20/2017  . 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 Lumbar post-laminectomy syndrome 09/16/2014  . Diabetic neuropathy ([place]) 04/04/2013  . Postlaminectomy syndrome, lumbar region 05/18/2012  . Uncontrolled diabetes mellitus with diabetic nephropathy ([place]) 05/18/2012    Patient Centered Plan: Patient is on the following Treatment Plan(s):  Impulse Control; Substance Abuse  Recommendations for Services/Supports/Treatments: Recommendations for Services/Supports/Treatments Recommendations For Services/Supports/Treatments: Individual Therapy  Treatment Plan Summary: OP Treatment Plan Summary: Ultimate goal "be free of this addiction from opioids and to make sure that my mind is straight"(manage cravings)  Referrals to Alternative Service(s): Referred to Alternative Service(s):   Place:   Date:   Time:    Referred to Alternative Service(s):   Place:   Date:   Time:    Referred to Alternative Service(s):   Place:   Date:   Time:    Referred to Alternative Service(s):   Place:   Date:   Time:     Olegario Messier, LCSW, LCAS

## 2019-12-18 ENCOUNTER — Other Ambulatory Visit: Payer: Self-pay

## 2019-12-18 ENCOUNTER — Ambulatory Visit (INDEPENDENT_AMBULATORY_CARE_PROVIDER_SITE_OTHER): Payer: BC Managed Care – PPO | Admitting: Podiatry

## 2019-12-18 DIAGNOSIS — L97512 Non-pressure chronic ulcer of other part of right foot with fat layer exposed: Secondary | ICD-10-CM | POA: Diagnosis not present

## 2019-12-18 DIAGNOSIS — E08621 Diabetes mellitus due to underlying condition with foot ulcer: Secondary | ICD-10-CM

## 2019-12-18 DIAGNOSIS — L97511 Non-pressure chronic ulcer of other part of right foot limited to breakdown of skin: Secondary | ICD-10-CM

## 2019-12-21 NOTE — Progress Notes (Signed)
   Subjective:  Patient presents today status post tendo Achilles lengthening right. DOS: 08/12/2019. He states he is doing well but reports the wound looks the same. He denies any pain or modifying factors. He has been using Prisma dressing daily as directed. He has been wearing his DM shoes as instructed. Patient is here for further evaluation and treatment.    Past Medical History:  Diagnosis Date  . Back pain   . Diabetes mellitus   . Headache(784.0)    general  . Hyperlipidemia   . Hypertension   . Osteomyelitis ([place]) 11/2018   RIGHT FOOT      Objective/Physical Exam Neurovascular status intact.  Skin incisions appear to be well coapted. No dehiscence. No active bleeding noted. No signs of infection.  Wound #1 of the right foot measuring 1.0 x 1.0 x 0.2 cm.   To the above-noted ulceration, there is no eschar. There is a moderate amount of slough, fibrin and necrotic tissue. Granulation tissue and wound base is red. There is no malodor. There is a minimal amount of serosanginous drainage noted. Periwound integrity is intact.    Assessment: 1. s/p tendo Achilles lengthening right. DOS: 08/12/2019 2. Ulcer right foot secondary to diabetes mellitus 3. History of transmetatarsal amputation right foot 4. Edema RLE - resolved    Plan of Care:  1. Patient was evaluated. 2. Medically necessary excisional debridement including subcutaneous tissue was performed using a tissue nipper and a chisel blade. Excisional debridement of all the necrotic nonviable tissue down to healthy bleeding viable tissue was performed with post-debridement measurements same as pre-. 3. The wound was cleansed and dry sterile dressing applied. 4. Continue using Prisma dressing daily with a dry sterile dressing.  5. Offloading felt dancer's pads applied to DM insole.  6. We will pursue possible in-office amniotic graft application.  7. Return to clinic in 3 weeks.    Edrick Kins, DPM [facility]  Dr. Edrick Kins, [address]                                        [place], [place] [address]                Office [phone]  Fax [phone]

## 2019-12-24 ENCOUNTER — Ambulatory Visit (INDEPENDENT_AMBULATORY_CARE_PROVIDER_SITE_OTHER): Payer: BC Managed Care – PPO | Admitting: Licensed Clinical Social Worker

## 2019-12-24 ENCOUNTER — Other Ambulatory Visit: Payer: Self-pay

## 2019-12-24 DIAGNOSIS — F1110 Opioid abuse, uncomplicated: Secondary | ICD-10-CM | POA: Diagnosis not present

## 2019-12-24 NOTE — Progress Notes (Signed)
Virtual Visit via Telephone Note  I connected with LILTON PARE on 12/24/19 at  9:00 AM EST by telephone and verified that I am speaking with the correct person using two identifiers.   I discussed the limitations, risks, security and privacy concerns of performing an evaluation and management service by telephone and the availability of in person appointments. I also discussed with the patient that there may be a patient responsible charge related to this service. The patient expressed understanding and agreed to proceed.  I discussed the assessment and treatment plan with the patient. The patient was provided an opportunity to ask questions and all were answered. The patient agreed with the plan and demonstrated an understanding of the instructions.   The patient was advised to call back or seek an in-person evaluation if the symptoms worsen or if the condition fails to improve as anticipated.  I provided 45 minutes of non-face-to-face time during this encounter.   Olegario Messier, LCSW    THERAPIST PROGRESS NOTE  Session Time: 9am-9:45am  Participation Level: Active  Behavioral Response: NAAlertEuthymic  Type of Therapy: Individual Therapy  Treatment Goals addressed: Coping and Diagnosis: Achieve and maintain sobriety 7/7 days per week.  Interventions: Motivational Interviewing and Supportive  Summary: JACOBE STUDY is a 64 y.o. male who presents with opioid use disorder. Client reports increasing motivation over the past 2 weeks. Client processed wanting to be more mindful of his health and be able to enjoy family time in the moment. Client shared examples of when addiction has made him miss out on being as supportive of family members as he would like. Client shared wanting to be able to set a good example of his grandchildren. Client is receptive to information about early recovery and being able to track progress as well as prepare for 'normal speedbumps'  Suicidal/Homicidal:  Nowithout intent/plan  Therapist Response: Clinician met with client via telehealth in his home. Clinician provided psycho-educational information related to early recovery including changes in chemicals in relation to motivation and Post Acute Withdrawal Syndrome. Clinician and client processed client motivation to achieve and maintain sobriety including his health and family.  Plan: Return again in 2 weeks.  Diagnosis: Axis I: Opioid use disorder        Olegario Messier, LCSW 12/24/2019

## 2020-01-07 ENCOUNTER — Ambulatory Visit (HOSPITAL_COMMUNITY): Payer: BC Managed Care – PPO | Admitting: Licensed Clinical Social Worker

## 2020-01-08 ENCOUNTER — Other Ambulatory Visit: Payer: Self-pay

## 2020-01-08 ENCOUNTER — Ambulatory Visit (INDEPENDENT_AMBULATORY_CARE_PROVIDER_SITE_OTHER): Payer: BC Managed Care – PPO | Admitting: Podiatry

## 2020-01-08 DIAGNOSIS — L97511 Non-pressure chronic ulcer of other part of right foot limited to breakdown of skin: Secondary | ICD-10-CM

## 2020-01-08 DIAGNOSIS — E08621 Diabetes mellitus due to underlying condition with foot ulcer: Secondary | ICD-10-CM

## 2020-01-08 DIAGNOSIS — L97512 Non-pressure chronic ulcer of other part of right foot with fat layer exposed: Secondary | ICD-10-CM | POA: Diagnosis not present

## 2020-01-12 NOTE — Progress Notes (Signed)
   Subjective:  Patient presents today status post tendo Achilles lengthening right. DOS: 08/12/2019. He is here for follow up evaluation of an ulceration of the right foot. He states the wound is improving. He has been using collagen dressing as directed. He denies any worsening factors at this time. Patient is here for further evaluation and treatment.    Past Medical History:  Diagnosis Date  . Back pain   . Diabetes mellitus   . Headache(784.0)    general  . Hyperlipidemia   . Hypertension   . Osteomyelitis ([place]) 11/2018   RIGHT FOOT      Objective/Physical Exam Neurovascular status intact.  Skin incisions appear to be well coapted. No dehiscence. No active bleeding noted. No signs of infection.  Wound #1 of the right foot measuring 0.5 x 0.5 x 0.1 cm.   To the above-noted ulceration, there is no eschar. There is a moderate amount of slough, fibrin and necrotic tissue. Granulation tissue and wound base is red. There is no malodor. There is a minimal amount of serosanginous drainage noted. Periwound integrity is intact.    Assessment: 1. s/p tendo Achilles lengthening right. DOS: 08/12/2019 2. Ulcer right foot secondary to diabetes mellitus 3. History of transmetatarsal amputation right foot   Plan of Care:  1. Patient was evaluated. 2. Medically necessary excisional debridement including subcutaneous tissue was performed using a tissue nipper and a chisel blade. Excisional debridement of all the necrotic nonviable tissue down to healthy bleeding viable tissue was performed with post-debridement measurements same as pre-. 3. The wound was cleansed and dry sterile dressing applied. 4. Continue wearing DM shoes with insoles and offloading met pads.  5. Continue using collagen dressing daily.  6. Return to clinic in 3 weeks.   Wife's name is [name].     Edrick Kins, DPM [facility]  Dr. Edrick Kins, [address]                                         [place], [place] [address]                Office [phone]  Fax [phone]

## 2020-02-13 ENCOUNTER — Telehealth: Payer: Self-pay | Admitting: Podiatry

## 2020-02-13 NOTE — Telephone Encounter (Signed)
Dr. Amalia Hailey im trying to figure out the RTW status of Mr. Fernando Anthony. I have called the number on file several times, with no luck of reaching anyone. His Disability ([employer]) was needing information. Thank you

## 2020-06-08 ENCOUNTER — Encounter: Payer: Self-pay | Admitting: Podiatry

## 2020-06-08 ENCOUNTER — Other Ambulatory Visit: Payer: Self-pay

## 2020-06-08 ENCOUNTER — Ambulatory Visit (INDEPENDENT_AMBULATORY_CARE_PROVIDER_SITE_OTHER): Payer: BC Managed Care – PPO | Admitting: Podiatry

## 2020-06-08 DIAGNOSIS — L97512 Non-pressure chronic ulcer of other part of right foot with fat layer exposed: Secondary | ICD-10-CM | POA: Diagnosis not present

## 2020-06-08 DIAGNOSIS — E0843 Diabetes mellitus due to underlying condition with diabetic autonomic (poly)neuropathy: Secondary | ICD-10-CM

## 2020-06-08 DIAGNOSIS — E08621 Diabetes mellitus due to underlying condition with foot ulcer: Secondary | ICD-10-CM

## 2020-06-08 DIAGNOSIS — L97511 Non-pressure chronic ulcer of other part of right foot limited to breakdown of skin: Secondary | ICD-10-CM

## 2020-06-08 MED ORDER — DOXYCYCLINE HYCLATE 100 MG PO TABS: 100.0000 mg | ORAL_TABLET | Freq: Two times a day (BID) | ORAL | 0 refills | Status: DC

## 2020-06-08 NOTE — Addendum Note (Signed)
Addended by: Wardell Heath on: 06/08/2020 04:13 PM   Modules accepted: Orders

## 2020-06-08 NOTE — Progress Notes (Signed)
   Subjective:  Patient with a past medical history of uncontrolled diabetes mellitus and osteomyelitis presents the office today for recurrence of a new ulceration that developed to the right forefoot.  Patient has history of right foot transmetatarsal amputation and Achilles lengthening.  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  He presents for further treatment evaluation    Past Medical History:  Diagnosis Date  . Back pain   . Diabetes mellitus   . Headache(784.0)    general  . Hyperlipidemia   . Hypertension   . Osteomyelitis ([place]) 11/2018   RIGHT FOOT        Objective/Physical Exam Neurovascular status intact.  Skin incisions appear to be well coapted. No dehiscence. No active bleeding noted. No signs of infection.  Wound #1 of the right foot measuring 3.5 x 4.0 x 0.2 cm.   To the above-noted ulceration, there is no eschar. There is a moderate amount of slough, fibrin and necrotic tissue. Granulation tissue and wound base is red. There is no malodor. There is a minimal amount of serosanginous drainage noted. Periwound integrity is intact.    Assessment: 1.  History of right foot TMA and tendo Achilles lengthening  2. Ulcer right foot secondary to diabetes mellitus  Plan of Care:  1. Patient was evaluated. 2. Medically necessary excisional debridement including subcutaneous tissue was performed using a tissue nipper and a chisel blade. Excisional debridement of all the necrotic nonviable tissue down to healthy bleeding viable tissue was performed with post-debridement measurements same as pre-. 3. The wound was cleansed and dry sterile dressing applied. 4.  Deep wound cultures were taken and sent to pathology 5.  Resume cam boot. 6.  Recommend daily application of Betadine and dry sterile dressing 7.  Prescription for doxycycline 100 mg 2 times daily  #28  8.  Return to clinic in 2 weeks   Wife's name is [name].     Edrick Kins, DPM [facility]  Dr. Edrick Kins, [address]                                        [place], [place] [address]                Office [phone]  Fax [phone]

## 2020-06-12 LAB — WOUND CULTURE
MICRO NUMBER:: 10721308
SPECIMEN QUALITY:: ADEQUATE

## 2020-06-22 ENCOUNTER — Other Ambulatory Visit: Payer: Self-pay

## 2020-06-22 ENCOUNTER — Ambulatory Visit (INDEPENDENT_AMBULATORY_CARE_PROVIDER_SITE_OTHER): Payer: BC Managed Care – PPO | Admitting: Podiatry

## 2020-06-22 ENCOUNTER — Encounter: Payer: Self-pay | Admitting: Podiatry

## 2020-06-22 DIAGNOSIS — L97511 Non-pressure chronic ulcer of other part of right foot limited to breakdown of skin: Secondary | ICD-10-CM

## 2020-06-22 DIAGNOSIS — L97512 Non-pressure chronic ulcer of other part of right foot with fat layer exposed: Secondary | ICD-10-CM | POA: Diagnosis not present

## 2020-06-22 DIAGNOSIS — E08621 Diabetes mellitus due to underlying condition with foot ulcer: Secondary | ICD-10-CM

## 2020-06-22 DIAGNOSIS — E0843 Diabetes mellitus due to underlying condition with diabetic autonomic (poly)neuropathy: Secondary | ICD-10-CM

## 2020-06-22 NOTE — Progress Notes (Signed)
   Subjective:  64 y.o. male with PMHx of diabetes mellitus, uncontrolled for evaluation of follow-up ulcer to the right foot amputation stump site.  Patient has been applying Betadine ointment daily and wearing the cam boot.  He is also reduced his activity significantly.  Patient is also currently taking his oral doxycycline and has approximately 2 days left.  No new complaints at this time.  Past Medical History:  Diagnosis Date  . Back pain   . Diabetes mellitus   . Headache(784.0)    general  . Hyperlipidemia   . Hypertension   . Osteomyelitis ([place]) 11/2018   RIGHT FOOT      Objective/Physical Exam General: The patient is alert and oriented x3 in no acute distress.  Dermatology:  Wound #1 noted to the amputation foot stump right foot measuring approximately 3.0 x 3.5 x 0.2 cm (LxWxD).   To the noted ulceration(s), there is no eschar. There is a moderate amount of slough, fibrin, and necrotic tissue noted. Granulation tissue and wound base is red. There is a minimal amount of serosanguineous drainage noted. There is no exposed bone muscle-tendon ligament or joint. There is no malodor. Periwound integrity is intact. Skin is warm, dry and supple bilateral lower extremities.  Vascular: Palpable pedal pulses bilaterally. No edema or erythema noted. Capillary refill within normal limits.  Neurological: Epicritic and protective threshold diminished bilaterally.   Musculoskeletal Exam: Range of motion within normal limits to all pedal and ankle joints bilateral. Muscle strength 5/5 in all groups bilateral.   Assessment: 1.  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 diabetes mellitus w/ peripheral neuropathy 3.  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 Patient was evaluated. 2. medically necessary excisional debridement including subcutaneous tissue was performed using a tissue nipper and a chisel blade. Excisional debridement of all  the necrotic nonviable tissue down to healthy bleeding viable tissue was performed with post-debridement measurements same as pre-. 3. the wound was cleansed and dry sterile dressing applied. 4.  Continue Betadine ointment daily  5.  Continue weightbearing in the cam boot.   6.  Offloading felt pads were provided today  7.  Patient is to return to clinic in 2 weeks.  *Wife's name is Lezlie Octave, DPM [facility]  Dr. Edrick Kins, [address]                                        [place], [place]  [address]                Office [phone]  Fax [phone]

## 2020-06-30 ENCOUNTER — Other Ambulatory Visit (HOSPITAL_COMMUNITY): Payer: Self-pay | Admitting: Internal Medicine

## 2020-06-30 DIAGNOSIS — I129 Hypertensive chronic kidney disease with stage 1 through stage 4 chronic kidney disease, or unspecified chronic kidney disease: Secondary | ICD-10-CM

## 2020-06-30 DIAGNOSIS — N179 Acute kidney failure, unspecified: Secondary | ICD-10-CM

## 2020-07-08 ENCOUNTER — Ambulatory Visit (INDEPENDENT_AMBULATORY_CARE_PROVIDER_SITE_OTHER): Payer: BC Managed Care – PPO | Admitting: Podiatry

## 2020-07-08 ENCOUNTER — Other Ambulatory Visit: Payer: Self-pay

## 2020-07-08 DIAGNOSIS — L97511 Non-pressure chronic ulcer of other part of right foot limited to breakdown of skin: Secondary | ICD-10-CM | POA: Diagnosis not present

## 2020-07-08 DIAGNOSIS — L97512 Non-pressure chronic ulcer of other part of right foot with fat layer exposed: Secondary | ICD-10-CM | POA: Diagnosis not present

## 2020-07-08 DIAGNOSIS — E08621 Diabetes mellitus due to underlying condition with foot ulcer: Secondary | ICD-10-CM | POA: Diagnosis not present

## 2020-07-08 NOTE — Progress Notes (Signed)
   Subjective:  64 y.o. male with PMHx of diabetes mellitus, uncontrolled for evaluation of follow-up ulcer to the right foot amputation stump site.  Patient has been applying Betadine ointment daily and wearing the cam boot.  He is also reduced his activity significantly.  Patient has recently completed his oral doxycycline.  No new complaints at this time Past Medical History:  Diagnosis Date  . Back pain   . Diabetes mellitus   . Headache(784.0)    general  . Hyperlipidemia   . Hypertension   . Osteomyelitis ([place]) 11/2018   RIGHT FOOT      Objective/Physical Exam General: The patient is alert and oriented x3 in no acute distress.  Dermatology:  Wound #1 noted to the amputation foot stump right foot measuring approximately 2.5x2.5 x 0.2 cm (LxWxD).   To the noted ulceration(s), there is no eschar. There is a moderate amount of slough, fibrin, and necrotic tissue noted. Granulation tissue and wound base is red. There is a minimal amount of serosanguineous drainage noted. There is no exposed bone muscle-tendon ligament or joint. There is no malodor. Periwound integrity is intact. Skin is warm, dry and supple bilateral lower extremities.  Vascular: Palpable pedal pulses bilaterally. No edema or erythema noted. Capillary refill within normal limits.  Neurological: Epicritic and protective threshold diminished bilaterally.   Musculoskeletal Exam: h/o right foot TMA  Assessment: 1.  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 diabetes mellitus w/ peripheral neuropathy 3.  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 Patient was evaluated. 2. medically necessary excisional debridement including subcutaneous tissue was performed using a tissue nipper and a chisel blade. Excisional debridement of all the necrotic nonviable tissue down to healthy bleeding viable tissue was performed with post-debridement measurements same as pre-. 3. the wound  was cleansed and dry sterile dressing applied. 4.  Alginate wound dressing Ag was applied today.  Supplies were provided for the patient to apply daily. 5.  Continue weightbearing in the cam boot.   6.  Offloading felt pads were provided today  7.  Patient is to return to clinic in 2 weeks.  *Wife's name is Lezlie Octave, DPM [facility]  Dr. Edrick Kins, [address]                                        [place], [place] [address]                Office [phone]  Fax [phone]

## 2020-07-10 ENCOUNTER — Encounter (HOSPITAL_COMMUNITY): Payer: Self-pay

## 2020-07-10 ENCOUNTER — Ambulatory Visit (HOSPITAL_COMMUNITY): Payer: BC Managed Care – PPO

## 2020-07-22 ENCOUNTER — Ambulatory Visit: Payer: BC Managed Care – PPO | Admitting: Podiatry

## 2020-08-07 ENCOUNTER — Other Ambulatory Visit: Payer: Self-pay

## 2020-08-10 ENCOUNTER — Other Ambulatory Visit: Payer: Self-pay

## 2020-08-10 ENCOUNTER — Ambulatory Visit (INDEPENDENT_AMBULATORY_CARE_PROVIDER_SITE_OTHER): Payer: BC Managed Care – PPO | Admitting: Podiatry

## 2020-08-10 ENCOUNTER — Other Ambulatory Visit: Payer: Self-pay | Admitting: Podiatry

## 2020-08-10 DIAGNOSIS — L97511 Non-pressure chronic ulcer of other part of right foot limited to breakdown of skin: Secondary | ICD-10-CM | POA: Diagnosis not present

## 2020-08-10 DIAGNOSIS — L97512 Non-pressure chronic ulcer of other part of right foot with fat layer exposed: Secondary | ICD-10-CM

## 2020-08-10 DIAGNOSIS — E08621 Diabetes mellitus due to underlying condition with foot ulcer: Secondary | ICD-10-CM | POA: Diagnosis not present

## 2020-08-10 MED ORDER — DOXYCYCLINE HYCLATE 100 MG PO TABS: 100.0000 mg | ORAL_TABLET | Freq: Two times a day (BID) | ORAL | 0 refills | Status: DC

## 2020-08-10 NOTE — Progress Notes (Signed)
   Subjective:  64 y.o. male with PMHx of diabetes mellitus, uncontrolled for evaluation of follow-up ulcer to the right foot amputation stump site.  Patient has been applying silver alginate daily to the area.  He is concerned because he feels like the wound is getting worse.  He is not able to tolerate the offloading felt pads because they become very sore.  He has been weightbearing in the cam boot however he has recently ordered an iWalk to take pressure off of his foot.  He presents for further treatment evaluation  Past Medical History:  Diagnosis Date  . Back pain   . Diabetes mellitus   . Headache(784.0)    general  . Hyperlipidemia   . Hypertension   . Osteomyelitis ([place]) 11/2018   RIGHT FOOT      Objective/Physical Exam General: The patient is alert and oriented x3 in no acute distress.  Dermatology:  Wound #1 noted to the amputation foot stump right foot measuring approximately 3.5x3.5 x 0.3 cm (LxWxD).   To the noted ulceration(s), there is no eschar. There is a moderate amount of slough, fibrin, and necrotic tissue noted. Granulation tissue and wound base is red. There is a minimal amount of serosanguineous drainage noted. There is no exposed bone muscle-tendon ligament or joint. There is no malodor. Periwound integrity is intact. Skin is warm, dry and supple bilateral lower extremities.  Vascular: Palpable pedal pulses bilaterally. No edema noted.  However there is some erythema noted localized just to the distal foot stump capillary refill within normal limits.  Neurological: Epicritic and protective threshold diminished bilaterally.   Musculoskeletal Exam: h/o right foot TMA  Assessment: 1.  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 diabetes mellitus w/ peripheral neuropathy 3.  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  Cellulitis right foot   Plan of Care:  1. Patient was evaluated. 2. medically necessary excisional debridement  including subcutaneous tissue was performed using a tissue nipper and a chisel blade. Excisional debridement of all the necrotic nonviable tissue down to healthy bleeding viable tissue was performed with post-debridement measurements same as pre-. 3. the wound was cleansed and dry sterile dressing applied. 4.  Recommend Betadine ointment daily  5.  Continue minimal weightbearing in the cam boot.  Patient recently ordered an iWalk system to take pressure completely off of his foot.  Recommended daily 6.  Cultures were taken and sent to pathology.  It does appear that he does have some recurrent cellulitis of the foot 7.  Prescription for doxycycline 100 mg 2 times daily #28  8.  Patient is to return to clinic in 2 weeks.  *Wife's name is Lezlie Octave, DPM [facility]  Dr. Edrick Kins, [address]                                        [place], [place] [address]                Office [phone]  Fax [phone]

## 2020-08-10 NOTE — Addendum Note (Signed)
Addended by: Lind Guest on: 08/10/2020 11:42 AM   Modules accepted: Orders

## 2020-08-10 NOTE — Telephone Encounter (Signed)
Please Advise

## 2020-08-11 ENCOUNTER — Telehealth: Payer: Self-pay

## 2020-08-11 NOTE — Telephone Encounter (Signed)
Pt called for antibiotic what was entered wrong. Please advise Pt states the prescription is for the [facility] on [address].

## 2020-08-13 LAB — WOUND CULTURE
MICRO NUMBER:: 10971972
SPECIMEN QUALITY:: ADEQUATE

## 2020-08-24 ENCOUNTER — Other Ambulatory Visit: Payer: Self-pay

## 2020-08-24 ENCOUNTER — Ambulatory Visit (INDEPENDENT_AMBULATORY_CARE_PROVIDER_SITE_OTHER): Payer: BC Managed Care – PPO | Admitting: Podiatry

## 2020-08-24 DIAGNOSIS — L97512 Non-pressure chronic ulcer of other part of right foot with fat layer exposed: Secondary | ICD-10-CM

## 2020-08-24 DIAGNOSIS — E0843 Diabetes mellitus due to underlying condition with diabetic autonomic (poly)neuropathy: Secondary | ICD-10-CM

## 2020-08-24 DIAGNOSIS — L97511 Non-pressure chronic ulcer of other part of right foot limited to breakdown of skin: Secondary | ICD-10-CM

## 2020-08-24 DIAGNOSIS — E08621 Diabetes mellitus due to underlying condition with foot ulcer: Secondary | ICD-10-CM

## 2020-08-25 ENCOUNTER — Encounter: Payer: Self-pay | Admitting: Podiatry

## 2020-08-26 MED ORDER — SANTYL 250 UNIT/GM EX OINT: 1.0000 "application " | TOPICAL_OINTMENT | Freq: Every day | CUTANEOUS | 3 refills | Status: DC

## 2020-08-26 NOTE — Telephone Encounter (Signed)
Pt left VM that he has never received the Santyl. Discussed with Dr. Amalia Hailey and he has sent it to the pharmacy.   Left VM for patient and will send mychart message

## 2020-08-31 NOTE — Progress Notes (Signed)
° °  Subjective:  64 y.o. male with PMHx of diabetes mellitus, uncontrolled for evaluation of follow-up ulcer to the right foot amputation stump site.  Patient has been applying silver alginate daily to the area.  He states that the wound seems to be about the same.  Is not getting worse however he feels that it is not getting much better.  He has completed his oral doxycycline as prescribed.  He has been keeping the wound clean and applying the dressings daily.  No new complaints at this time  Past Medical History:  Diagnosis Date   Back pain    Diabetes mellitus    Headache(784.0)    general   Hyperlipidemia    Hypertension    Osteomyelitis ([place]) 11/2018   RIGHT FOOT      Objective/Physical Exam General: The patient is alert and oriented x3 in no acute distress.  Dermatology:  Wound #1 noted to the amputation foot stump right foot measuring approximately 3.0x3.0 x 0.2 cm (LxWxD).   To the noted ulceration(s), there is no eschar. There is a moderate amount of slough, fibrin, and necrotic tissue noted. Granulation tissue and wound base is red. There is a minimal amount of serosanguineous drainage noted. There is no exposed bone muscle-tendon ligament or joint. There is no malodor. Periwound integrity is intact. Skin is warm, dry and supple bilateral lower extremities.  Vascular: Palpable pedal pulses bilaterally. No edema noted.  However there is some erythema noted localized just to the distal foot stump capillary refill within normal limits.  Neurological: Epicritic and protective threshold diminished bilaterally.   Musculoskeletal Exam: h/o right foot TMA  Assessment: 1.  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 diabetes mellitus w/ peripheral neuropathy 3.  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  Cellulitis right foot-resolved   Plan of Care:  1. Patient was evaluated.  Cultures reviewed today 2. medically necessary excisional  debridement including subcutaneous tissue was performed using a tissue nipper and a chisel blade. Excisional debridement of all the necrotic nonviable tissue down to healthy bleeding viable tissue was performed with post-debridement measurements same as pre-. 3. the wound was cleansed and dry sterile dressing applied. 4.  At 1 point the patient was applying the Santyl ointment daily with improvement.  Today we will represcribe the Santyl ointment 5.  Continue minimal weightbearing in the cam boot.  Patient recently ordered an iWalk system to take pressure completely off of his foot.  Recommended daily 6.  Return to clinic in 2 weeks  *Wife's name is Lezlie Octave, DPM [facility]  Dr. Edrick Kins, [address]                                        [place], [place] [address]                Office [phone]  Fax [phone]

## 2020-09-14 ENCOUNTER — Other Ambulatory Visit: Payer: Self-pay | Admitting: Podiatry

## 2020-09-14 ENCOUNTER — Other Ambulatory Visit: Payer: Self-pay

## 2020-09-14 ENCOUNTER — Ambulatory Visit (INDEPENDENT_AMBULATORY_CARE_PROVIDER_SITE_OTHER): Payer: BC Managed Care – PPO | Admitting: Podiatry

## 2020-09-14 DIAGNOSIS — L97511 Non-pressure chronic ulcer of other part of right foot limited to breakdown of skin: Secondary | ICD-10-CM

## 2020-09-14 DIAGNOSIS — L97512 Non-pressure chronic ulcer of other part of right foot with fat layer exposed: Secondary | ICD-10-CM | POA: Diagnosis not present

## 2020-09-14 DIAGNOSIS — E08621 Diabetes mellitus due to underlying condition with foot ulcer: Secondary | ICD-10-CM

## 2020-09-14 NOTE — Progress Notes (Signed)
Subjective:  64 y.o. male with PMHx of diabetes mellitus, uncontrolled for evaluation of follow-up ulcer to the right foot amputation stump site.  Patient has been applying silver alginate daily to the area.  He states that the wound seems to be about the same.  Is not getting worse however he feels that it is not getting much better.  He has completed his oral doxycycline as prescribed.  He has been keeping the wound clean and applying the dressings daily.  No new complaints at this time  Past Medical History:  Diagnosis Date  . Back pain   . Diabetes mellitus   . Headache(784.0)    general  . Hyperlipidemia   . Hypertension   . Osteomyelitis ([place]) 11/2018   RIGHT FOOT      Objective/Physical Exam General: The patient is alert and oriented x3 in no acute distress.  Dermatology:  Wound #1 noted to the amputation foot stump right foot measuring approximately 3.0x3.0 x 0.2 cm (LxWxD).   To the noted ulceration(s), there is no eschar. There is a moderate amount of slough, fibrin, and necrotic tissue noted. Granulation tissue and wound base is red. There is a minimal amount of serosanguineous drainage noted. There is no exposed bone muscle-tendon ligament or joint. There is no malodor. Periwound integrity is intact. Skin is warm, dry and supple bilateral lower extremities.  Vascular: Palpable pedal pulses bilaterally. No edema noted.  However there is some erythema noted localized just to the distal foot stump capillary refill within normal limits.  Neurological: Epicritic and protective threshold diminished bilaterally.   Musculoskeletal Exam: h/o right foot TMA  Assessment: 1.  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 diabetes mellitus w/ peripheral neuropathy 3.  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  Cellulitis right foot-resolved   Plan of Care:  1. Patient was evaluated.  The patient did inquire about possible application of skin graft  substitute.  We have done this in the past in the hospital setting and it helped significantly.  He would like to pursue this treatment modality again if possible.  I will contact the rep for the graft to see if we can apply it next visit if approved.  2. medically necessary excisional debridement including subcutaneous tissue was performed using a tissue nipper and a chisel blade. Excisional debridement of all the necrotic nonviable tissue down to healthy bleeding viable tissue was performed with post-debridement measurements same as pre-. 3. the wound was cleansed and dry sterile dressing applied. 4.  Continue Santyl ointment daily. 5.  Continue minimal weightbearing in the cam boot.  Patient recently ordered an iWalk system to take pressure completely off of his foot.  Recommended daily 6.  Return to clinic in 2 weeks for possible graft application.  Note this graft will be medically necessary as the patient has failed conservative treatment for over 6 weeks despite routine conservative debridements and wound care.  *Wife's name is Lezlie Octave, DPM [facility]  Dr. Edrick Kins, [address] [place]                                        [place], [place] [address]                Office [phone]  Fax [phone]

## 2020-09-14 NOTE — Addendum Note (Signed)
Addended by: Lind Guest on: 09/14/2020 04:12 PM   Modules accepted: Orders

## 2020-09-18 LAB — WOUND CULTURE
MICRO NUMBER:: 11113803
SPECIMEN QUALITY:: ADEQUATE

## 2020-09-18 LAB — HOUSE ACCOUNT TRACKING

## 2020-09-23 ENCOUNTER — Encounter: Payer: Self-pay | Admitting: Podiatry

## 2020-09-25 ENCOUNTER — Other Ambulatory Visit: Payer: Self-pay

## 2020-09-25 ENCOUNTER — Telehealth: Payer: Self-pay | Admitting: Podiatry

## 2020-09-25 MED ORDER — DOXYCYCLINE HYCLATE 100 MG PO CAPS: 100.0000 mg | ORAL_CAPSULE | Freq: Two times a day (BID) | ORAL | 0 refills | Status: DC

## 2020-09-25 NOTE — Telephone Encounter (Signed)
Pt called stating his pharmacy never received an order for his antibiotic. Please advise.

## 2020-09-30 ENCOUNTER — Ambulatory Visit (INDEPENDENT_AMBULATORY_CARE_PROVIDER_SITE_OTHER): Payer: BC Managed Care – PPO | Admitting: Podiatry

## 2020-09-30 ENCOUNTER — Other Ambulatory Visit: Payer: Self-pay

## 2020-09-30 DIAGNOSIS — L97512 Non-pressure chronic ulcer of other part of right foot with fat layer exposed: Secondary | ICD-10-CM | POA: Diagnosis not present

## 2020-09-30 DIAGNOSIS — L97511 Non-pressure chronic ulcer of other part of right foot limited to breakdown of skin: Secondary | ICD-10-CM

## 2020-09-30 DIAGNOSIS — E08621 Diabetes mellitus due to underlying condition with foot ulcer: Secondary | ICD-10-CM

## 2020-09-30 NOTE — Progress Notes (Signed)
   Subjective:  64 y.o. male with PMHx of diabetes mellitus, uncontrolled for evaluation of follow-up ulcer to the right foot amputation stump site.  Patient has been applying silver alginate daily to the area.  He states that the wound seems to be about the same.  Is not getting worse however he feels that it is not getting much better.  He presents for further treatment and evaluation  Past Medical History:  Diagnosis Date  . Back pain   . Diabetes mellitus   . Headache(784.0)    general  . Hyperlipidemia   . Hypertension   . Osteomyelitis ([place]) 11/2018   RIGHT FOOT      Objective/Physical Exam General: The patient is alert and oriented x3 in no acute distress.  Dermatology:  Wound #1 noted to the amputation foot stump right foot measuring approximately 3.0x3.0 x 0.2 cm (LxWxD).   To the noted ulceration(s), there is no eschar. There is a moderate amount of slough, fibrin, and necrotic tissue noted. Granulation tissue and wound base is red. There is a minimal amount of serosanguineous drainage noted. There is no exposed bone muscle-tendon ligament or joint. There is no malodor. Periwound integrity is intact. Skin is warm, dry and supple bilateral lower extremities.  Vascular: Palpable pedal pulses bilaterally. No edema noted.  However there is some erythema noted localized just to the distal foot stump capillary refill within normal limits.  Neurological: Epicritic and protective threshold diminished bilaterally.   Musculoskeletal Exam: h/o right foot TMA  Assessment: 1.  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 diabetes mellitus w/ peripheral neuropathy 3.  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  Cellulitis right foot-resolved   Plan of Care:  1. Patient was evaluated.   2. medically necessary excisional debridement including subcutaneous tissue was performed using a tissue nipper and a chisel blade. Excisional debridement of all the necrotic  nonviable tissue down to healthy bleeding viable tissue was performed with post-debridement measurements same as pre-. 3. the wound was cleansed and dry sterile dressing applied. 4.  Today application of 2H4TM Cytal wound matrix 2-layer was applied to the wound and secured using Steri-Strips.  Dry sterile dressing was applied with instructions to keep clean dry and intact x2 weeks 5.  Return to clinic in 2 weeks  Cytal wound matrix 2-layer 5x5cm REF [id number] EXP 2022-06-20  *Wife's name is Lezlie Octave, DPM [facility]  Dr. Edrick Kins, [address]                                        [place], [place] [address]                Office [phone]  Fax [phone]

## 2021-01-04 ENCOUNTER — Ambulatory Visit (INDEPENDENT_AMBULATORY_CARE_PROVIDER_SITE_OTHER): Payer: BC Managed Care – PPO | Admitting: Podiatry

## 2021-01-04 ENCOUNTER — Emergency Department (HOSPITAL_COMMUNITY): Payer: BC Managed Care – PPO

## 2021-01-04 ENCOUNTER — Inpatient Hospital Stay (HOSPITAL_COMMUNITY)
Admission: EM | Admit: 2021-01-04 | Discharge: 2021-01-16 | DRG: 616 | Disposition: A | Discharge status: Rehabilitation Facility | Payer: BC Managed Care – PPO | Attending: Internal Medicine | Admitting: Internal Medicine

## 2021-01-04 ENCOUNTER — Other Ambulatory Visit: Payer: Self-pay | Admitting: Podiatry

## 2021-01-04 ENCOUNTER — Ambulatory Visit (INDEPENDENT_AMBULATORY_CARE_PROVIDER_SITE_OTHER): Payer: BC Managed Care – PPO

## 2021-01-04 ENCOUNTER — Inpatient Hospital Stay (HOSPITAL_COMMUNITY): Payer: BC Managed Care – PPO

## 2021-01-04 ENCOUNTER — Other Ambulatory Visit: Payer: Self-pay

## 2021-01-04 ENCOUNTER — Encounter (HOSPITAL_COMMUNITY): Payer: Self-pay | Admitting: Emergency Medicine

## 2021-01-04 DIAGNOSIS — R7989 Other specified abnormal findings of blood chemistry: Secondary | ICD-10-CM | POA: Diagnosis present

## 2021-01-04 DIAGNOSIS — R339 Retention of urine, unspecified: Secondary | ICD-10-CM | POA: Diagnosis not present

## 2021-01-04 DIAGNOSIS — E785 Hyperlipidemia, unspecified: Secondary | ICD-10-CM | POA: Diagnosis present

## 2021-01-04 DIAGNOSIS — M86171 Other acute osteomyelitis, right ankle and foot: Secondary | ICD-10-CM | POA: Diagnosis present

## 2021-01-04 DIAGNOSIS — L02611 Cutaneous abscess of right foot: Secondary | ICD-10-CM | POA: Diagnosis present

## 2021-01-04 DIAGNOSIS — Z885 Allergy status to narcotic agent status: Secondary | ICD-10-CM

## 2021-01-04 DIAGNOSIS — L97512 Non-pressure chronic ulcer of other part of right foot with fat layer exposed: Secondary | ICD-10-CM

## 2021-01-04 DIAGNOSIS — M869 Osteomyelitis, unspecified: Secondary | ICD-10-CM

## 2021-01-04 DIAGNOSIS — R945 Abnormal results of liver function studies: Secondary | ICD-10-CM | POA: Diagnosis not present

## 2021-01-04 DIAGNOSIS — J811 Chronic pulmonary edema: Secondary | ICD-10-CM | POA: Diagnosis not present

## 2021-01-04 DIAGNOSIS — L97419 Non-pressure chronic ulcer of right heel and midfoot with unspecified severity: Secondary | ICD-10-CM | POA: Diagnosis present

## 2021-01-04 DIAGNOSIS — L03115 Cellulitis of right lower limb: Secondary | ICD-10-CM | POA: Diagnosis present

## 2021-01-04 DIAGNOSIS — E1165 Type 2 diabetes mellitus with hyperglycemia: Secondary | ICD-10-CM | POA: Diagnosis present

## 2021-01-04 DIAGNOSIS — E871 Hypo-osmolality and hyponatremia: Secondary | ICD-10-CM | POA: Diagnosis not present

## 2021-01-04 DIAGNOSIS — D649 Anemia, unspecified: Secondary | ICD-10-CM | POA: Diagnosis not present

## 2021-01-04 DIAGNOSIS — N170 Acute kidney failure with tubular necrosis: Secondary | ICD-10-CM | POA: Diagnosis not present

## 2021-01-04 DIAGNOSIS — M86271 Subacute osteomyelitis, right ankle and foot: Secondary | ICD-10-CM | POA: Diagnosis not present

## 2021-01-04 DIAGNOSIS — R188 Other ascites: Secondary | ICD-10-CM | POA: Diagnosis present

## 2021-01-04 DIAGNOSIS — M86671 Other chronic osteomyelitis, right ankle and foot: Secondary | ICD-10-CM | POA: Diagnosis not present

## 2021-01-04 DIAGNOSIS — E872 Acidosis: Secondary | ICD-10-CM | POA: Diagnosis not present

## 2021-01-04 DIAGNOSIS — D508 Other iron deficiency anemias: Secondary | ICD-10-CM | POA: Diagnosis present

## 2021-01-04 DIAGNOSIS — Z01818 Encounter for other preprocedural examination: Secondary | ICD-10-CM | POA: Diagnosis not present

## 2021-01-04 DIAGNOSIS — Z794 Long term (current) use of insulin: Secondary | ICD-10-CM | POA: Diagnosis not present

## 2021-01-04 DIAGNOSIS — Z8249 Family history of ischemic heart disease and other diseases of the circulatory system: Secondary | ICD-10-CM | POA: Diagnosis not present

## 2021-01-04 DIAGNOSIS — B9689 Other specified bacterial agents as the cause of diseases classified elsewhere: Secondary | ICD-10-CM | POA: Diagnosis present

## 2021-01-04 DIAGNOSIS — N179 Acute kidney failure, unspecified: Secondary | ICD-10-CM | POA: Diagnosis not present

## 2021-01-04 DIAGNOSIS — D638 Anemia in other chronic diseases classified elsewhere: Secondary | ICD-10-CM | POA: Diagnosis present

## 2021-01-04 DIAGNOSIS — E876 Hypokalemia: Secondary | ICD-10-CM | POA: Diagnosis present

## 2021-01-04 DIAGNOSIS — E1152 Type 2 diabetes mellitus with diabetic peripheral angiopathy with gangrene: Secondary | ICD-10-CM | POA: Diagnosis present

## 2021-01-04 DIAGNOSIS — E0843 Diabetes mellitus due to underlying condition with diabetic autonomic (poly)neuropathy: Secondary | ICD-10-CM

## 2021-01-04 DIAGNOSIS — Z79899 Other long term (current) drug therapy: Secondary | ICD-10-CM

## 2021-01-04 DIAGNOSIS — I10 Essential (primary) hypertension: Secondary | ICD-10-CM | POA: Diagnosis present

## 2021-01-04 DIAGNOSIS — H538 Other visual disturbances: Secondary | ICD-10-CM | POA: Diagnosis present

## 2021-01-04 DIAGNOSIS — E1142 Type 2 diabetes mellitus with diabetic polyneuropathy: Secondary | ICD-10-CM | POA: Diagnosis present

## 2021-01-04 DIAGNOSIS — R0602 Shortness of breath: Secondary | ICD-10-CM

## 2021-01-04 DIAGNOSIS — E1121 Type 2 diabetes mellitus with diabetic nephropathy: Secondary | ICD-10-CM | POA: Diagnosis not present

## 2021-01-04 DIAGNOSIS — IMO0002 Reserved for concepts with insufficient information to code with codable children: Secondary | ICD-10-CM | POA: Diagnosis present

## 2021-01-04 DIAGNOSIS — M7731 Calcaneal spur, right foot: Secondary | ICD-10-CM | POA: Diagnosis present

## 2021-01-04 DIAGNOSIS — N5089 Other specified disorders of the male genital organs: Secondary | ICD-10-CM | POA: Diagnosis not present

## 2021-01-04 DIAGNOSIS — Z6828 Body mass index (BMI) 28.0-28.9, adult: Secondary | ICD-10-CM

## 2021-01-04 DIAGNOSIS — A419 Sepsis, unspecified organism: Secondary | ICD-10-CM

## 2021-01-04 DIAGNOSIS — R748 Abnormal levels of other serum enzymes: Secondary | ICD-10-CM | POA: Diagnosis present

## 2021-01-04 DIAGNOSIS — G4700 Insomnia, unspecified: Secondary | ICD-10-CM | POA: Diagnosis not present

## 2021-01-04 DIAGNOSIS — E861 Hypovolemia: Secondary | ICD-10-CM | POA: Diagnosis present

## 2021-01-04 DIAGNOSIS — Z7982 Long term (current) use of aspirin: Secondary | ICD-10-CM

## 2021-01-04 DIAGNOSIS — Z20822 Contact with and (suspected) exposure to covid-19: Secondary | ICD-10-CM | POA: Diagnosis present

## 2021-01-04 DIAGNOSIS — F17210 Nicotine dependence, cigarettes, uncomplicated: Secondary | ICD-10-CM | POA: Diagnosis present

## 2021-01-04 DIAGNOSIS — K7200 Acute and subacute hepatic failure without coma: Secondary | ICD-10-CM | POA: Diagnosis not present

## 2021-01-04 DIAGNOSIS — R7881 Bacteremia: Secondary | ICD-10-CM | POA: Diagnosis present

## 2021-01-04 DIAGNOSIS — E43 Unspecified severe protein-calorie malnutrition: Secondary | ICD-10-CM | POA: Diagnosis present

## 2021-01-04 DIAGNOSIS — E1169 Type 2 diabetes mellitus with other specified complication: Secondary | ICD-10-CM | POA: Diagnosis present

## 2021-01-04 DIAGNOSIS — Z09 Encounter for follow-up examination after completed treatment for conditions other than malignant neoplasm: Secondary | ICD-10-CM

## 2021-01-04 DIAGNOSIS — Z89431 Acquired absence of right foot: Secondary | ICD-10-CM | POA: Diagnosis not present

## 2021-01-04 DIAGNOSIS — E669 Obesity, unspecified: Secondary | ICD-10-CM | POA: Diagnosis present

## 2021-01-04 DIAGNOSIS — E11621 Type 2 diabetes mellitus with foot ulcer: Secondary | ICD-10-CM | POA: Diagnosis present

## 2021-01-04 DIAGNOSIS — B954 Other streptococcus as the cause of diseases classified elsewhere: Secondary | ICD-10-CM | POA: Diagnosis present

## 2021-01-04 DIAGNOSIS — Z4781 Encounter for orthopedic aftercare following surgical amputation: Secondary | ICD-10-CM | POA: Diagnosis not present

## 2021-01-04 DIAGNOSIS — R3911 Hesitancy of micturition: Secondary | ICD-10-CM | POA: Diagnosis not present

## 2021-01-04 DIAGNOSIS — E1151 Type 2 diabetes mellitus with diabetic peripheral angiopathy without gangrene: Secondary | ICD-10-CM | POA: Diagnosis not present

## 2021-01-04 DIAGNOSIS — Z89511 Acquired absence of right leg below knee: Secondary | ICD-10-CM | POA: Diagnosis not present

## 2021-01-04 DIAGNOSIS — E8809 Other disorders of plasma-protein metabolism, not elsewhere classified: Secondary | ICD-10-CM | POA: Diagnosis not present

## 2021-01-04 DIAGNOSIS — R0989 Other specified symptoms and signs involving the circulatory and respiratory systems: Secondary | ICD-10-CM

## 2021-01-04 LAB — RESP PANEL BY RT-PCR (FLU A&B, COVID) ARPGX2
Influenza A by PCR: NEGATIVE
Influenza B by PCR: NEGATIVE
SARS Coronavirus 2 by RT PCR: NEGATIVE

## 2021-01-04 LAB — URINALYSIS, ROUTINE W REFLEX MICROSCOPIC
Bilirubin Urine: NEGATIVE
Glucose, UA: 50 mg/dL — AB
Ketones, ur: NEGATIVE mg/dL
Leukocytes,Ua: NEGATIVE
Nitrite: NEGATIVE
Protein, ur: >=300 mg/dL — AB
Specific Gravity, Urine: 1.015 (ref 1.005–1.030)
pH: 5 (ref 5.0–8.0)

## 2021-01-04 LAB — CBC WITH DIFFERENTIAL/PLATELET
Abs Immature Granulocytes: 0.2 10*3/uL — ABNORMAL HIGH (ref 0.00–0.07)
Basophils Absolute: 0 10*3/uL (ref 0.0–0.1)
Basophils Relative: 0 %
Eosinophils Absolute: 0 10*3/uL (ref 0.0–0.5)
Eosinophils Relative: 0 %
HCT: 30.6 % — ABNORMAL LOW (ref 39.0–52.0)
Hemoglobin: 9.9 g/dL — ABNORMAL LOW (ref 13.0–17.0)
Immature Granulocytes: 1 %
Lymphocytes Relative: 2 %
Lymphs Abs: 0.6 10*3/uL — ABNORMAL LOW (ref 0.7–4.0)
MCH: 31.1 pg (ref 26.0–34.0)
MCHC: 32.4 g/dL (ref 30.0–36.0)
MCV: 96.2 fL (ref 80.0–100.0)
Monocytes Absolute: 1.2 10*3/uL — ABNORMAL HIGH (ref 0.1–1.0)
Monocytes Relative: 5 %
Neutro Abs: 23.3 10*3/uL — ABNORMAL HIGH (ref 1.7–7.7)
Neutrophils Relative %: 92 %
Platelets: 240 10*3/uL (ref 150–400)
RBC: 3.18 MIL/uL — ABNORMAL LOW (ref 4.22–5.81)
RDW: 13 % (ref 11.5–15.5)
WBC: 25.3 10*3/uL — ABNORMAL HIGH (ref 4.0–10.5)
nRBC: 0 % (ref 0.0–0.2)

## 2021-01-04 LAB — COMPREHENSIVE METABOLIC PANEL
ALT: 116 U/L — ABNORMAL HIGH (ref 0–44)
AST: 218 U/L — ABNORMAL HIGH (ref 15–41)
Albumin: 2.3 g/dL — ABNORMAL LOW (ref 3.5–5.0)
Alkaline Phosphatase: 409 U/L — ABNORMAL HIGH (ref 38–126)
Anion gap: 14 (ref 5–15)
BUN: 56 mg/dL — ABNORMAL HIGH (ref 8–23)
CO2: 24 mmol/L (ref 22–32)
Calcium: 7.9 mg/dL — ABNORMAL LOW (ref 8.9–10.3)
Chloride: 91 mmol/L — ABNORMAL LOW (ref 98–111)
Creatinine, Ser: 3.94 mg/dL — ABNORMAL HIGH (ref 0.61–1.24)
GFR, Estimated: 16 mL/min — ABNORMAL LOW (ref >60)
Glucose, Bld: 239 mg/dL — ABNORMAL HIGH (ref 70–99)
Potassium: 3.4 mmol/L — ABNORMAL LOW (ref 3.5–5.1)
Sodium: 129 mmol/L — ABNORMAL LOW (ref 135–145)
Total Bilirubin: 2.1 mg/dL — ABNORMAL HIGH (ref 0.3–1.2)
Total Protein: 6.4 g/dL — ABNORMAL LOW (ref 6.5–8.1)

## 2021-01-04 LAB — LACTIC ACID, PLASMA
Lactic Acid, Venous: 2.6 mmol/L (ref 0.5–1.9)
Lactic Acid, Venous: 3 mmol/L (ref 0.5–1.9)
Lactic Acid, Venous: 1.4 mmol/L (ref 0.5–1.9)

## 2021-01-04 LAB — C-REACTIVE PROTEIN: CRP: 19.3 mg/dL — ABNORMAL HIGH (ref <1.0)

## 2021-01-04 LAB — BRAIN NATRIURETIC PEPTIDE: B Natriuretic Peptide: 449.2 pg/mL — ABNORMAL HIGH (ref 0.0–100.0)

## 2021-01-04 LAB — PROTIME-INR
INR: 1.2 (ref 0.8–1.2)
Prothrombin Time: 14.9 seconds (ref 11.4–15.2)

## 2021-01-04 LAB — GLUCOSE, CAPILLARY: Glucose-Capillary: 168 mg/dL — ABNORMAL HIGH (ref 70–99)

## 2021-01-04 LAB — APTT: aPTT: 38 seconds — ABNORMAL HIGH (ref 24–36)

## 2021-01-04 LAB — SEDIMENTATION RATE: Sed Rate: 129 mm/hr — ABNORMAL HIGH (ref 0–16)

## 2021-01-04 LAB — CREATININE, URINE, RANDOM: Creatinine, Urine: 111.16 mg/dL

## 2021-01-04 LAB — SODIUM, URINE, RANDOM: Sodium, Ur: 13 mmol/L

## 2021-01-04 MED ORDER — HYDRALAZINE HCL 25 MG PO TABS
12.5000 mg | ORAL_TABLET | Freq: Two times a day (BID) | ORAL | Status: DC
Start: 2021-01-04 — End: 2021-01-07
  Administered 2021-01-04 – 2021-01-07 (×5): 12.5 mg via ORAL
  Filled 2021-01-04 (×5): qty 1

## 2021-01-04 MED ORDER — SODIUM CHLORIDE 0.9 % IV BOLUS (SEPSIS)
1000.0000 mL | Freq: Once | INTRAVENOUS | Status: AC
  Administered 2021-01-04: 1000 mL via INTRAVENOUS

## 2021-01-04 MED ORDER — AMLODIPINE BESYLATE 10 MG PO TABS
10.0000 mg | ORAL_TABLET | Freq: Every day | ORAL | Status: DC
  Administered 2021-01-05 – 2021-01-16 (×12): 10 mg via ORAL
  Filled 2021-01-04 (×12): qty 1

## 2021-01-04 MED ORDER — INSULIN GLARGINE 100 UNIT/ML ~~LOC~~ SOLN
30.0000 [IU] | Freq: Every day | SUBCUTANEOUS | Status: DC
  Administered 2021-01-04 – 2021-01-05 (×2): 30 [IU] via SUBCUTANEOUS
  Filled 2021-01-04 (×2): qty 0.3

## 2021-01-04 MED ORDER — ASPIRIN EC 81 MG PO TBEC
81.0000 mg | DELAYED_RELEASE_TABLET | Freq: Every day | ORAL | Status: DC
  Administered 2021-01-07 – 2021-01-15 (×9): 81 mg via ORAL
  Filled 2021-01-04 (×9): qty 1

## 2021-01-04 MED ORDER — PREGABALIN 25 MG PO CAPS
50.0000 mg | ORAL_CAPSULE | Freq: Three times a day (TID) | ORAL | Status: DC
  Administered 2021-01-04 – 2021-01-16 (×34): 50 mg via ORAL
  Filled 2021-01-04: qty 2
  Filled 2021-01-04 (×3): qty 1
  Filled 2021-01-04: qty 2
  Filled 2021-01-04: qty 1
  Filled 2021-01-04 (×5): qty 2
  Filled 2021-01-04: qty 1
  Filled 2021-01-04 (×2): qty 2
  Filled 2021-01-04 (×2): qty 1
  Filled 2021-01-04 (×3): qty 2
  Filled 2021-01-04: qty 1
  Filled 2021-01-04 (×3): qty 2
  Filled 2021-01-04: qty 1
  Filled 2021-01-04 (×2): qty 2
  Filled 2021-01-04 (×3): qty 1
  Filled 2021-01-04 (×2): qty 2
  Filled 2021-01-04: qty 1
  Filled 2021-01-04 (×2): qty 2

## 2021-01-04 MED ORDER — OMEGA-3-ACID ETHYL ESTERS 1 G PO CAPS
1.0000 g | ORAL_CAPSULE | Freq: Every day | ORAL | Status: DC
  Administered 2021-01-05 – 2021-01-16 (×12): 1 g via ORAL
  Filled 2021-01-04 (×13): qty 1

## 2021-01-04 MED ORDER — CLONIDINE HCL 0.1 MG PO TABS
0.1000 mg | ORAL_TABLET | Freq: Two times a day (BID) | ORAL | Status: DC
  Administered 2021-01-04 – 2021-01-16 (×23): 0.1 mg via ORAL
  Filled 2021-01-04 (×23): qty 1

## 2021-01-04 MED ORDER — ATENOLOL 50 MG PO TABS
50.0000 mg | ORAL_TABLET | Freq: Every day | ORAL | Status: DC
  Administered 2021-01-04 – 2021-01-15 (×11): 50 mg via ORAL
  Filled 2021-01-04 (×12): qty 1

## 2021-01-04 MED ORDER — INSULIN ASPART 100 UNIT/ML ~~LOC~~ SOLN
0.0000 [IU] | Freq: Three times a day (TID) | SUBCUTANEOUS | Status: DC
  Administered 2021-01-05 (×2): 3 [IU] via SUBCUTANEOUS
  Administered 2021-01-07: 5 [IU] via SUBCUTANEOUS
  Administered 2021-01-07: 11 [IU] via SUBCUTANEOUS
  Filled 2021-01-04: qty 0.15

## 2021-01-04 MED ORDER — OXYCODONE HCL 5 MG PO TABS
5.0000 mg | ORAL_TABLET | Freq: Once | ORAL | Status: AC
  Administered 2021-01-04: 5 mg via ORAL
  Filled 2021-01-04: qty 1

## 2021-01-04 MED ORDER — ROSUVASTATIN CALCIUM 20 MG PO TABS
20.0000 mg | ORAL_TABLET | Freq: Every day | ORAL | Status: DC
  Administered 2021-01-04 – 2021-01-05 (×2): 20 mg via ORAL
  Filled 2021-01-04 (×3): qty 1

## 2021-01-04 MED ORDER — VANCOMYCIN HCL IN DEXTROSE 1-5 GM/200ML-% IV SOLN
1000.0000 mg | Freq: Once | INTRAVENOUS | Status: AC
  Administered 2021-01-04: 1000 mg via INTRAVENOUS
  Filled 2021-01-04: qty 200

## 2021-01-04 MED ORDER — INSULIN ASPART 100 UNIT/ML ~~LOC~~ SOLN
0.0000 [IU] | Freq: Every day | SUBCUTANEOUS | Status: DC
  Filled 2021-01-04: qty 0.05

## 2021-01-04 MED ORDER — SODIUM CHLORIDE 0.9 % IV SOLN: INTRAVENOUS | Status: DC

## 2021-01-04 MED ORDER — SODIUM CHLORIDE 0.9 % IV SOLN
2.0000 g | INTRAVENOUS | Status: AC
  Administered 2021-01-04 – 2021-01-13 (×10): 2 g via INTRAVENOUS
  Filled 2021-01-04: qty 20
  Filled 2021-01-04 (×2): qty 2
  Filled 2021-01-04: qty 20
  Filled 2021-01-04 (×2): qty 2
  Filled 2021-01-04 (×2): qty 20
  Filled 2021-01-04: qty 2
  Filled 2021-01-04 (×2): qty 20

## 2021-01-04 NOTE — Progress Notes (Signed)
[facility] will continue to follow for sepsis protocol

## 2021-01-04 NOTE — ED Notes (Signed)
Report called to [facility], pt will be transported to the floor following MRI.

## 2021-01-04 NOTE — H&P (View-Only) (Signed)
   Subjective:  65 y.o. male with PMHx of diabetes mellitus, uncontrolled for evaluation of follow-up ulcer to the right foot amputation stump site.  Patient presenting today with his wife.  Patient has noticed severe increased pain and tenderness to the right foot and heel for the past week now.  Today he presents with malaise and nausea.  He states that he is not feeling well at all.  He presents for further treatment and evaluation  Past Medical History:  Diagnosis Date  . Back pain   . Diabetes mellitus   . Headache(784.0)    general  . Hyperlipidemia   . Hypertension   . Osteomyelitis ([place]) 11/2018   RIGHT FOOT      Objective/Physical Exam General: The patient is alert and oriented x3 in no acute distress.  Dermatology:  Wound #1 noted to the amputation foot stump right foot measuring approximately 3.0x3.0 x 0.2 cm (LxWxD).  The actual ulcer to the distal stump of the foot appears stable.  No drainage noted.  Wound base is mostly granular  There is a new finding today of a draining abscess to the plantar aspect of the right heel.  Purulent drainage noted.  Mild malodor noted.  Vascular: Palpable pedal pulses bilaterally.  Increased edema and erythema extending up to the level of the ankle.  Foot is warm to touch  Neurological: Epicritic and protective threshold diminished bilaterally.   Musculoskeletal Exam: h/o right foot TMA  Assessment: 1.  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 diabetes mellitus w/ peripheral neuropathy 3.  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  Draining abscess right heel 5.  Cellulitis right foot and ankle   Plan of Care:  1. Patient was evaluated.   2.  Since the patient has been nauseous and not feeling well at all, also given his PMHx of uncontrolled DM, recommend that he goes immediately to the emergency department for further work-up and likely admission.  Patient understands and states that he will go to the  [place] long ED 3.  Today cultures were taken and sent to pathology for culture and sensitivity 4.  Please consult podiatry once patient is admitted so podiatry can follow inpatient. Once admitted, recommend MRI right foot to determine the extent of the abscess.  Patient understands that he will likely need surgical incision and drainage with debridement right foot while inpatient.   *Wife's name is Fernando Anthony, DPM [facility]  Dr. Edrick Kins, DPM    [address], [place] [address]                Office [phone]  Fax [phone]

## 2021-01-04 NOTE — ED Triage Notes (Signed)
Per pt, states he was sent by his foot MD due to infected sore on his right heal-states wound for a couple of week-states it is draining

## 2021-01-04 NOTE — ED Notes (Signed)
Bladder scan shows 213m

## 2021-01-04 NOTE — H&P (Signed)
History and Physical    Fernando Anthony [id number] DOB: 03-Jul-1956 DOA: 01/04/2021  PCP: Curly Rim, MD  Patient coming from: Home, wife at bedside  I have personally briefly reviewed patient's old medical records in [place]  Chief Complaint: right plantar ulcer wound infection  HPI: Fernando Anthony is a 65 y.o. male with medical history significant for uncontrolled insulin-dependent type 2 diabetes, osteomyelitis of the right foot s/p transmetatarsal amputation, and hypertension who presents with concerns of right plantar ulcer infection.  For the past several days he has noticed increased swelling and pain of his right lower extremity.  He then began to noticed increased purulent drainage from his right plantar wound.  Also had chills but no objective fever.  He was nauseous and had an episode of vomiting.  But no diarrhea.  Has had decreased appetite for the past several days.  He was seen by podiatry Dr. Daylene Katayama today and was sent over to the ER for MRI and anticipated surgical debridement due to concerns of osteomyelitis.  ED Course: He was afebrile normotensive. Hgb of 9.9. Has significant leukocytosis of 25.3.  Lactate of 2.6.  Hyponatremia of 129 with glucose of 239.  Significant AKI with creatinine of 3.94 from a prior of 1.01 a year ago.  BUN of 56.  AST elevated at 218.  ALT of 116.  Bilirubin of 2.1.   Right foot x-ray is suspicious for acute osteomyelitis.  Review of Systems: Constitutional: No Weight Change, No Fever ENT/Mouth: No sore throat, No Rhinorrhea Eyes: No Eye Pain, No Vision Changes Cardiovascular: No Chest Pain, no SOB, No PND, No Dyspnea on Exertion, No Orthopnea, +Edema, No Palpitations Respiratory: No Cough, No Sputum, No Wheezing, no Dyspnea  Gastrointestinal: No Nausea, No Vomiting, No Diarrhea, No Constipation, No Pain Genitourinary: no Urinary Incontinence, No Urgency, No Flank Pain Musculoskeletal: No Arthralgias, No Myalgias Skin: No  Skin Lesions, No Pruritus, Neuro: no Weakness, No Numbness,  No Loss of Consciousness, No Syncope Psych: No Anxiety/Panic, No Depression, +decrease appetite Heme/Lymph: No Bruising, No Bleeding   Past Medical History:  Diagnosis Date  . Back pain   . Diabetes mellitus   . Headache(784.0)    general  . Hyperlipidemia   . Hypertension   . Osteomyelitis ([place]) 11/2018   RIGHT FOOT    Past Surgical History:  Procedure Laterality Date  . AMPUTATION Right 09/18/2013   Procedure: Amputation Right Great Toe at MTP;  Surgeon: Newt Minion, MD;  Location: [facility];  Service: Orthopedics;  Laterality: Right;  Amputation Right Great Toe at MTP  . AMPUTATION Right 12/05/2018   Procedure: PARTIAL AMPUTATION FIRST RAY RIGHT FOOT;  Surgeon: Edrick Kins, DPM;  Location: [facility];  Service: Podiatry;  Laterality: Right;  . CARDIAC CATHETERIZATION  ?1990  . SPINE SURGERY       reports that he has been smoking cigarettes. He has a 40.00 pack-year smoking history. He has never used smokeless tobacco. He reports previous alcohol use. He reports previous drug use. Social History  Allergies  Allergen Reactions  . Vicodin [Hydrocodone-Acetaminophen] Itching    Family History  Problem Relation Age of Onset  . Cancer Mother   . Heart disease Father      Prior to Admission medications   Medication Sig Start Date End Date Taking? Authorizing Provider  acetaminophen (TYLENOL) 500 MG tablet Take 1,500 mg by mouth every 6 (six) hours as needed.   Yes [provider]  amLODipine (NORVASC)  10 MG tablet Take 10 mg by mouth daily.   Yes [provider]  aspirin 81 MG tablet Take 81 mg by mouth at bedtime.    Yes [provider]  atenolol (TENORMIN) 50 MG tablet Take 50 mg by mouth daily.   Yes [provider]  cloNIDine (CATAPRES) 0.1 MG tablet Take 0.1 mg by mouth 2 (two) times daily. 01/31/19  Yes [provider]  collagenase (SANTYL) ointment Apply 1  application topically daily. 3.5cm x 3.5cm x 0.3cm (LxWxD) RT foot Patient taking differently: Apply 1 application topically daily as needed (foot infection). 08/26/20  Yes Edrick Kins, DPM  doxycycline (VIBRAMYCIN) 100 MG capsule Take 1 capsule (100 mg total) by mouth 2 (two) times daily. Patient taking differently: Take 100 mg by mouth 2 (two) times daily. Start date : 01/02/21 09/25/20  Yes Daylene Katayama M, DPM  FIASP FLEXTOUCH 100 UNIT/ML SOPN Inject 13 Units into the skin 3 (three) times daily. 01/19/19  Yes [provider]  furosemide (LASIX) 20 MG tablet Take 20 mg by mouth 2 (two) times daily.   Yes [provider]  hydrALAZINE (APRESOLINE) 25 MG tablet Take 12.5 mg by mouth 2 (two) times daily.   Yes [provider]  hydrochlorothiazide (MICROZIDE) 12.5 MG capsule Take 12.5 mg by mouth daily.  08/23/18  Yes [provider]  insulin glargine, 2 Unit Dial, (TOUJEO MAX SOLOSTAR) 300 UNIT/ML Solostar Pen Inject 40 Units into the skin at bedtime.   Yes [provider]  lisinopril (ZESTRIL) 40 MG tablet Take 40 mg by mouth daily.   Yes [provider]  Omega-3 Fatty Acids (FISH OIL) 500 MG CAPS Take 500 mg by mouth at bedtime.    Yes [provider]  pregabalin (LYRICA) 50 MG capsule Take 50 mg by mouth 3 (three) times daily.   Yes [provider]  rosuvastatin (CRESTOR) 20 MG tablet Take 20 mg by mouth daily. 01/09/19  Yes [provider]  sodium chloride (OCEAN) 0.65 % SOLN nasal spray Place 1 spray into both nostrils daily as needed for congestion.   Yes [provider]  Continuous Blood Gluc Sensor (FREESTYLE LIBRE 14 DAY SENSOR) MISC ONE EACH (1 DEVICE DOSE) BY DOES NOT APPLY ROUTE EVERY 14 (FOURTEEN) DAYS. 01/19/19   [provider]  gabapentin (NEURONTIN) 100 MG capsule Take 1 capsule (100 mg total) by mouth 3 (three) times daily. Patient not taking: Reported on 01/04/2021 09/16/14   Charlett Blake, MD    Physical Exam: Vitals:   01/04/21 1811 01/04/21 1830 01/04/21 1900 01/04/21 1938  BP: 132/83 123/67 126/71 111/73  Pulse: 70 70 65 65  Resp: '14 16 13 20  '$ Temp:      TempSrc:      SpO2: 92% 94% 94% 93%  Weight:        Constitutional: NAD, calm, comfortable, elderly male laying at 30 degree incline in bed Vitals:   01/04/21 1811 01/04/21 1830 01/04/21 1900 01/04/21 1938  BP: 132/83 123/67 126/71 111/73  Pulse: 70 70 65 65  Resp: '14 16 13 20  '$ Temp:      TempSrc:      SpO2: 92% 94% 94% 93%  Weight:       Eyes: PERRL, lids and conjunctivae normal ENMT: Mucous membranes are moist.  Neck: normal, supple Respiratory: clear to auscultation bilaterally, no wheezing, no crackles. Normal respiratory effort. No accessory muscle use.  Cardiovascular: Regular rate and rhythm, no murmurs / rubs /  gallops.  +3 pitting edema of bilateral lower extremity up to the knee  abdomen: no tenderness, no masses palpated.  Bowel sounds positive.  Musculoskeletal: no clubbing / cyanosis.  Transmetatarsal amputation of the right foot.  There is a superficial ulcer wound to the plantar surface of the first MTP of the right foot.  Right heel deep ulcer with minimal drainage and malodor. Skin: no rashes, lesions, ulcers. No induration Neurologic: CN 2-12 grossly intact. Sensation intact, Strength 5/5 in all 4.  Psychiatric: Normal judgment and insight. Alert and oriented x 3. Normal mood.    Labs on Admission: I have personally reviewed following labs and imaging studies  CBC: Recent Labs  Lab 01/04/21 1604  WBC 25.3*  NEUTROABS 23.3*  HGB 9.9*  HCT 30.6*  MCV 96.2  PLT [id number]   Basic Metabolic Panel: Recent Labs  Lab 01/04/21 1604  NA 129*  K 3.4*  CL 91*  CO2 24  GLUCOSE 239*  BUN 56*  CREATININE 3.94*  CALCIUM 7.9*   GFR: CrCl cannot be calculated (Unknown ideal weight.). Liver Function Tests: Recent Labs  Lab 01/04/21 1604  AST 218*  ALT 116*  ALKPHOS 409*   BILITOT 2.1*  PROT 6.4*  ALBUMIN 2.3*   No results for input(s): LIPASE, AMYLASE in the last 168 hours. No results for input(s): AMMONIA in the last 168 hours. Coagulation Profile: Recent Labs  Lab 01/04/21 1649  INR 1.2   Cardiac Enzymes: No results for input(s): CKTOTAL, CKMB, CKMBINDEX, TROPONINI in the last 168 hours. BNP (last 3 results) No results for input(s): PROBNP in the last 8760 hours. HbA1C: No results for input(s): HGBA1C in the last 72 hours. CBG: No results for input(s): GLUCAP in the last 168 hours. Lipid Profile: No results for input(s): CHOL, HDL, LDLCALC, TRIG, CHOLHDL, LDLDIRECT in the last 72 hours. Thyroid Function Tests: No results for input(s): TSH, T4TOTAL, FREET4, T3FREE, THYROIDAB in the last 72 hours. Anemia Panel: No results for input(s): VITAMINB12, FOLATE, FERRITIN, TIBC, IRON, RETICCTPCT in the last 72 hours. Urine analysis:    Component Value Date/Time   COLORURINE AMBER (A) 01/04/2021 1826   APPEARANCEUR CLOUDY (A) 01/04/2021 1826   LABSPEC 1.015 01/04/2021 1826   PHURINE 5.0 01/04/2021 1826   GLUCOSEU 50 (A) 01/04/2021 1826   HGBUR MODERATE (A) 01/04/2021 1826   BILIRUBINUR NEGATIVE 01/04/2021 1826   KETONESUR NEGATIVE 01/04/2021 1826   PROTEINUR >=300 (A) 01/04/2021 1826   NITRITE NEGATIVE 01/04/2021 1826   LEUKOCYTESUR NEGATIVE 01/04/2021 1826    Radiological Exams on Admission: DG Chest 2 View  Result Date: 01/04/2021 CLINICAL DATA:  Diabetic foot wound EXAM: CHEST - 2 VIEW COMPARISON:  12/28/2014 FINDINGS: Heart size is upper limits of normal. Mild pulmonary vascular congestion. No focal airspace consolidation, pleural effusion, or pneumothorax. IMPRESSION: Mild pulmonary vascular congestion. No focal airspace consolidation. Electronically Signed   By: Davina Poke D.O.   On: 01/04/2021 18:09   DG Foot Complete Right  Result Date: 01/04/2021 CLINICAL DATA:  Nonhealing foot wound EXAM: RIGHT FOOT COMPLETE - 3+ VIEW  COMPARISON:  07/03/2019 FINDINGS: Patient is status post transmetatarsal amputation of the first-fifth rays. Resection margins appear preserved without evidence of a new erosion. There is ulceration plantar aspect of the heel underlying the calcaneal tuberosity. Cortical irregularity of the plantar surface of the posterior calcaneus near the plantar fascia attachment is suspicious for acute osteomyelitis. Air within the soft tissues underlying the site of ulceration suspicious for sinus tract. Vascular calcifications are present. IMPRESSION:  1. Cortical irregularity of the plantar surface of the posterior calcaneus is suspicious for acute osteomyelitis. 2. Plantar foot ulceration with suspected sinus tract extending to the underlying calcaneus. Electronically Signed   By: Davina Poke D.O.   On: 01/04/2021 18:08      Assessment/Plan  Suspected osteomyelitis of the right foot plantar diabetic wound Patient sent over by podiatrist Dr. Daylene Katayama today for concerns of osteomyelitis Will obtain MRI of the foot Continue IV vancomycin and Rocephin Podiatry will need formal consult in the morning and anticipates taking 2 OR for debridement Keep n.p.o. past midnight  AKI Significantly elevated creatinine of 3.94 and BUN 56.  Patient has had decreased appetite endorsed taking some ibuprofen for pain. Obtain FENA labs Continue IV fluid resuscitation.  Avoid nephrotoxic agent  Hyponatremia This corrects to 131 after accounting for hyperglycemia Secondary to hypovolemia.  Continue IV fluid resuscitation and follow labs in the morning  Normocytic anemia Hemoglobin 9.9.  Patient has been noted to be anemic in the past down to 11 through 10. Denies any bleeding or dark stools.  Has never had a colonoscopy but believes he has done 1 stool card in the past that was negative. Obtain FOBT  Obtain iron panel, vitamin B12 and folate  Transaminitis/elevated bilirubin  AST of 218.  ALT of 116.  Alkaline  phosphatase of 409.  Bilirubin of 2.1 Benign abdominal exam.  No complaints except for nausea and episode of vomiting. Patient denies frequent alcohol use.  Takes Tylenol for pain but not routinely. He was noted to have elevated LFTs several years ago.  Had negative viral panel and negative RUQ work-up.  ?Vascular congestion pulmonary congestion noted on CXR. Pt denies shortness of breath, orthopena or PND. BNP unequivocal at 449. Has bilateral LE edema and reports similar in the past where he received just PRN Lasix.  Could be third spacing from worsening renal function. Had negative Lexiscan on 08/2020  low suspicion for any ACS   Uncontrolled type 2 diabetes with neuropathy On 40 units of Toujeo and 13 units of aspart 3 times daily at home Start with 30 units of Lantus and moderate sliding scale here Continue pregabalin  Hypertension Continue amlodipine, atenolol, clonidine, hydralazine Hold Lasix, HCTZ, lisinopril due to AKI  Hyperlipidemia Continue statin, omega-3  DVT prophylaxis:.SCDS Code Status: Full Family Communication: Plan discussed with patient and wife at bedside  disposition Plan: Home with at least 2 midnight stays  Consults called:  Admission status: inpatient  Level of care: Med-Surg  Status is: Inpatient  Remains inpatient appropriate because:Inpatient level of care appropriate due to severity of illness   Dispo: The patient is from: Home              Anticipated d/c is to: Home              Anticipated d/c date is: > 3 days              Patient currently is not medically stable to d/c.   Difficult to place patient No         Orene Desanctis DO [facility]   If 7PM-7AM, please contact night-coverage [email or web address]   01/04/2021, 8:20 PM

## 2021-01-04 NOTE — ED Provider Notes (Signed)
[facility] DEPT Provider Note   CSN: [id number] Arrival date & time: 01/04/21  1548     History Chief Complaint  Patient presents with  . Foot Ulcer    Fernando Anthony is a 65 y.o. male with PMHx HTN, HLD, Diabetes, Hx of osteomyelitis of the right foot with transmetatarsal amputation 06/11/2019 who presents to the ED today at request of podiatrist with concern for worsening infection to right heal. Pt reports he has had a chronic ulcer to his right foot for the past year that they have been treating; he states that 2 weeks ago he noticed a foul smell/wound to his right heal. Pt reports his right lower extremity started swelling 1 week ago however feels like it is improving currently. Pt states that since then he has felt increasing fatigue and chills. He does not believe he has had a fever. No other complaints at this time.   The history is provided by the patient, the spouse and medical records.       Past Medical History:  Diagnosis Date  . Back pain   . Diabetes mellitus   . Headache(784.0)    general  . Hyperlipidemia   . Hypertension   . Osteomyelitis ([place]) 11/2018   RIGHT FOOT    Patient Active Problem List   Diagnosis Date Noted  . History of transmetatarsal amputation of right foot ([place]) 06/11/2019  . Neuropathic pain of foot, right 06/11/2019  . Memory difficulties 01/18/2019  . Psychophysiological insomnia 01/18/2019  . Hyperlipidemia associated with type 2 diabetes mellitus ([place]) 12/13/2018  . Hypertension associated with type 2 diabetes mellitus ([place]) 12/13/2018  . 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 Osteomyelitis of right foot ([place]) 12/04/2018  . Hypertensive urgency 12/04/2018  . Abnormal LFTs 12/04/2018  . Hyponatremia 12/04/2018  . Osteomyelitis ([place]) 12/04/2018  . Hypertension, essential, benign 08/20/2017  . Hypokalemia 08/20/2017  . 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 Lumbar post-laminectomy syndrome 09/16/2014  . Diabetic neuropathy ([place]) 04/04/2013  . Postlaminectomy syndrome, lumbar region 05/18/2012  . Uncontrolled diabetes mellitus with diabetic nephropathy ([place]) 05/18/2012    Past Surgical History:  Procedure Laterality Date  . AMPUTATION Right 09/18/2013   Procedure: Amputation Right Great Toe at [facility]TP;  Surgeon: Newt Minion, MD;  Location: [facility];  Service: Orthopedics;  Laterality: Right;  Amputation Right Great Toe at MTP  . AMPUTATION Right 12/05/2018   Procedure: PARTIAL AMPUTATION FIRST RAY RIGHT FOOT;  Surgeon: Edrick Kins, DPM;  Location: [facility];  Service: Podiatry;  Laterality: Right;  . CARDIAC CATHETERIZATION  ?1990  . SPINE SURGERY         Family History  Problem Relation Age of Onset  . Cancer Mother   . Heart disease Father     Social History   Tobacco Use  . Smoking status: Current Every Day Smoker    Packs/day: 1.00    Years: 40.00    Pack years: 40.00    Types: Cigarettes  . Smokeless tobacco: Never Used  Vaping Use  . Vaping Use: Former  Substance Use Topics  . Alcohol use: Not Currently  . Drug use: Not Currently    Comment: past use of marijuana    Home Medications Prior to Admission medications   Medication Sig Start Date End Date Taking? Authorizing Provider  acetaminophen (TYLENOL) 500 MG tablet Take 1,500 mg by mouth every 6 (six)  hours as needed.   Yes [provider]  amLODipine (NORVASC) 10 MG tablet Take 10 mg by mouth daily.   Yes [provider]  aspirin 81 MG tablet Take 81 mg by mouth at bedtime.    Yes [provider]  atenolol (TENORMIN) 50 MG tablet Take 50 mg by mouth daily.   Yes [provider]  cloNIDine (CATAPRES) 0.1 MG tablet Take 0.1 mg by mouth 2 (two) times daily. 01/31/19  Yes [provider]  collagenase (SANTYL) ointment Apply 1 application topically  daily. 3.5cm x 3.5cm x 0.3cm (LxWxD) RT foot Patient taking differently: Apply 1 application topically daily as needed (foot infection). 08/26/20  Yes Edrick Kins, DPM  doxycycline (VIBRAMYCIN) 100 MG capsule Take 1 capsule (100 mg total) by mouth 2 (two) times daily. Patient taking differently: Take 100 mg by mouth 2 (two) times daily. Start date : 01/02/21 09/25/20  Yes Daylene Katayama M, DPM  FIASP FLEXTOUCH 100 UNIT/ML SOPN Inject 13 Units into the skin 3 (three) times daily. 01/19/19  Yes [provider]  furosemide (LASIX) 20 MG tablet Take 20 mg by mouth 2 (two) times daily.   Yes [provider]  hydrALAZINE (APRESOLINE) 25 MG tablet Take 12.5 mg by mouth 2 (two) times daily.   Yes [provider]  hydrochlorothiazide (MICROZIDE) 12.5 MG capsule Take 12.5 mg by mouth daily.  08/23/18  Yes [provider]  insulin glargine, 2 Unit Dial, (TOUJEO MAX SOLOSTAR) 300 UNIT/ML Solostar Pen Inject 40 Units into the skin at bedtime.   Yes [provider]  lisinopril (ZESTRIL) 40 MG tablet Take 40 mg by mouth daily.   Yes [provider]  Omega-3 Fatty Acids (FISH OIL) 500 MG CAPS Take 500 mg by mouth at bedtime.    Yes [provider]  pregabalin (LYRICA) 50 MG capsule Take 50 mg by mouth 3 (three) times daily.   Yes [provider]  rosuvastatin (CRESTOR) 20 MG tablet Take 20 mg by mouth daily. 01/09/19  Yes [provider]  sodium chloride (OCEAN) 0.65 % SOLN nasal spray Place 1 spray into both nostrils daily as needed for congestion.   Yes [provider]  Continuous Blood Gluc Sensor (FREESTYLE LIBRE 14 DAY SENSOR) MISC ONE EACH (1 DEVICE DOSE) BY DOES NOT APPLY ROUTE EVERY 14 (FOURTEEN) DAYS. 01/19/19   [provider]  gabapentin (NEURONTIN) 100 MG capsule Take 1 capsule (100 mg total) by mouth 3 (three) times daily. Patient not taking: Reported on 01/04/2021 09/16/14   Charlett Blake, MD     Allergies    Vicodin [hydrocodone-acetaminophen]  Review of Systems   Review of Systems  Constitutional: Positive for chills and fatigue.  Respiratory: Negative for shortness of breath.   Cardiovascular: Positive for leg swelling (RLE). Negative for chest pain.  Gastrointestinal: Negative for nausea and vomiting.  Skin: Positive for wound.  All other systems reviewed and are negative.   Physical Exam Updated Vital Signs BP 121/66 (BP Location: Left Arm)   Pulse 62   Temp 99 F (37.2 C) (Oral)   Resp 16   SpO2 97%   Physical Exam Vitals and nursing note reviewed.  Constitutional:      Appearance: He is not ill-appearing, toxic-appearing or diaphoretic.  HENT:     Head: Normocephalic and atraumatic.  Eyes:     Conjunctiva/sclera: Conjunctivae normal.  Cardiovascular:     Rate and Rhythm: Normal rate and regular rhythm.  Pulses:          Posterior tibial pulses are detected w/ Doppler on the right side.  Pulmonary:     Effort: Pulmonary effort is normal.     Breath sounds: Normal breath sounds. No wheezing, rhonchi or rales.  Abdominal:     Palpations: Abdomen is soft.     Tenderness: There is no abdominal tenderness. There is no guarding or rebound.  Musculoskeletal:     Cervical back: Neck supple.     Comments: See photo below. 2 x 2 cm stage II diabetic ulcer noted to plantar aspect of foot without drainage. Small pinpoint wound noted to heal with active purulent foul smelling drainage. Bilateral pitting edema however R > L with 1 + pitting edema; no calf TTP.   Skin:    General: Skin is warm and dry.  Neurological:     Mental Status: He is alert.        ED Results / Procedures / Treatments   Labs (all labs ordered are listed, but only abnormal results are displayed) Labs Reviewed  LACTIC ACID, PLASMA - Abnormal; Notable for the following components:      Result Value   Lactic Acid, Venous 2.6 (*)    All other components within normal limits   COMPREHENSIVE METABOLIC PANEL - Abnormal; Notable for the following components:   Sodium 129 (*)    Potassium 3.4 (*)    Chloride 91 (*)    Glucose, Bld 239 (*)    BUN 56 (*)    Creatinine, Ser 3.94 (*)    Calcium 7.9 (*)    Total Protein 6.4 (*)    Albumin 2.3 (*)    AST 218 (*)    ALT 116 (*)    Alkaline Phosphatase 409 (*)    Total Bilirubin 2.1 (*)    GFR, Estimated 16 (*)    All other components within normal limits  CBC WITH DIFFERENTIAL/PLATELET - Abnormal; Notable for the following components:   WBC 25.3 (*)    RBC 3.18 (*)    Hemoglobin 9.9 (*)    HCT 30.6 (*)    Neutro Abs 23.3 (*)    Lymphs Abs 0.6 (*)    Monocytes Absolute 1.2 (*)    Abs Immature Granulocytes 0.20 (*)    All other components within normal limits  APTT - Abnormal; Notable for the following components:   aPTT 38 (*)    All other components within normal limits  CULTURE, BLOOD (ROUTINE X 2)  CULTURE, BLOOD (ROUTINE X 2)  RESP PANEL BY RT-PCR (FLU A&B, COVID) ARPGX2  AEROBIC CULTURE W GRAM STAIN (SUPERFICIAL SPECIMEN)  PROTIME-INR  LACTIC ACID, PLASMA  URINALYSIS, ROUTINE W REFLEX MICROSCOPIC  SEDIMENTATION RATE  C-REACTIVE PROTEIN  BRAIN NATRIURETIC PEPTIDE    EKG EKG Interpretation  Date/Time:  Monday January 04 2021 17:05:42 EST Ventricular Rate:  72 PR Interval:  148 QRS Duration: 94 QT Interval:  438 QTC Calculation: 479 R Axis:   88 Text Interpretation: Sinus rhythm with Premature supraventricular complexes Otherwise normal ECG Confirmed by Quintella Reichert [phone]) on 01/04/2021 5:20:31 PM   Radiology DG Chest 2 View  Result Date: 01/04/2021 CLINICAL DATA:  Diabetic foot wound EXAM: CHEST - 2 VIEW COMPARISON:  12/28/2014 FINDINGS: Heart size is upper limits of normal. Mild pulmonary vascular congestion. No focal airspace consolidation, pleural effusion, or pneumothorax. IMPRESSION: Mild pulmonary vascular congestion. No focal airspace consolidation. Electronically Signed   By:  Davina Poke D.O.   On:  01/04/2021 18:09   DG Foot Complete Right  Result Date: 01/04/2021 CLINICAL DATA:  Nonhealing foot wound EXAM: RIGHT FOOT COMPLETE - 3+ VIEW COMPARISON:  07/03/2019 FINDINGS: Patient is status post transmetatarsal amputation of the first-fifth rays. Resection margins appear preserved without evidence of a new erosion. There is ulceration plantar aspect of the heel underlying the calcaneal tuberosity. Cortical irregularity of the plantar surface of the posterior calcaneus near the plantar fascia attachment is suspicious for acute osteomyelitis. Air within the soft tissues underlying the site of ulceration suspicious for sinus tract. Vascular calcifications are present. IMPRESSION: 1. Cortical irregularity of the plantar surface of the posterior calcaneus is suspicious for acute osteomyelitis. 2. Plantar foot ulceration with suspected sinus tract extending to the underlying calcaneus. Electronically Signed   By: Davina Poke D.O.   On: 01/04/2021 18:08    Procedures .Critical Care Performed by: Eustaquio Maize, PA-C Authorized by: Eustaquio Maize, PA-C   Critical care provider statement:    Critical care time (minutes):  45   Critical care was necessary to treat or prevent imminent or life-threatening deterioration of the following conditions:  Sepsis   Critical care was time spent personally by me on the following activities:  Discussions with consultants, evaluation of patient's response to treatment, examination of patient, ordering and performing treatments and interventions, ordering and review of laboratory studies, ordering and review of radiographic studies, pulse oximetry, re-evaluation of patient's condition, obtaining history from patient or surrogate and review of old charts     Medications Ordered in ED Medications  0.9 %  sodium chloride infusion (has no administration in time range)  sodium chloride 0.9 % bolus 1,000 mL (1,000 mLs Intravenous New  Bag/Given 01/04/21 1805)  vancomycin (VANCOCIN) IVPB 1000 mg/200 mL premix (1,000 mg Intravenous New Bag/Given 01/04/21 1759)    ED Course  I have reviewed the triage vital signs and the nursing notes.  Pertinent labs & imaging results that were available during my care of the patient were reviewed by me and considered in my medical decision making (see chart for details).  Clinical Course as of 01/04/21 1848  Mon Jan 04, 2021  1653 WBC(!): 25.3 [MV]  1654 Creatinine(!): 3.94 [MV]  1654 BUN(!): 56 [MV]    Clinical Course User Index [MV] Eustaquio Maize, PA-C   MDM Rules/Calculators/A&P                          65 year old male with history of diabetes presents to the ED today from podiatry with concern for worsening infection to right heel.  Status post metatarsal amputation in 2020.  Patient has also been increasingly more fatigued with chills the past several days.  On arrival to the ED temperature 99.0.  He is nontachycardic, nontachypneic.  Nontoxic-appearing. On exam pt has chronic ulcer to plantar aspect with new small puncture type wound with active purulent drainage to heal. His RLE is more swollen compared to LLE however has 1+ pitting edema bilaterally and pt reports he has been having some calf pain as well however nontender on my exam today. He has dopplerable PT pulse on exam. Labwork was obtained while pt was in the waiting room - leukocytosis of 25,000 with left shift. Remainder of labs pending. Pt will need to be worked up for sepsis at this time. Will obtain wound and blood cultures and start on vancomycin. It appears he had an xray done at podiatry however I am unable to see report -  will repeat xray here today. ESR and CRP also added with concern for osteomyelitis. Given BLE swelling CXR ordered to assess for pulmonary edema. COVID test has been ordered.   CMP with creatinine 3.94 with BUN 56. Pt states he has not been urinating much today; will obtain bladder scan. LFTs  elevated however this appears to be chronic.   Lab Results  Component Value Date   CREATININE 3.94 (H) 01/04/2021   CREATININE 1.01 11/29/2019   CREATININE 0.86 12/07/2018   Bladder scan 201  Lactic acid 2.6; pt received fluids at this time. Given he is not in septic shock 30 cc/kg held at this time.  CXR with findings of pulmonary vascular congestion. BNP added at this time.   Xray of foot concerning for osteo Per chart review Dr. Amalia Hailey pt's podiatrist would like to be consulted after pt is admitted with likely needing MRI of foot with plan for surgery. Will consult hospitalist at this time who can consult podiatry.   Discussed case with Dr. Flossie Buffy who agrees to evaluate patient for admission.   This note was prepared using Dragon voice recognition software and may include unintentional dictation errors due to the inherent limitations of voice recognition software.  Final Clinical Impression(s) / ED Diagnoses Final diagnoses:  Osteomyelitis of right foot, unspecified type ([place])  Sepsis with acute renal failure without septic shock, due to unspecified organism, unspecified acute renal failure type [facility])  Pulmonary vascular congestion    Rx / DC Orders ED Discharge Orders    None       Eustaquio Maize, PA-C 01/04/21 1849    Quintella Reichert, MD 01/05/21 0003

## 2021-01-04 NOTE — Progress Notes (Signed)
   Subjective:  65 y.o. male with PMHx of diabetes mellitus, uncontrolled for evaluation of follow-up ulcer to the right foot amputation stump site.  Patient presenting today with his wife.  Patient has noticed severe increased pain and tenderness to the right foot and heel for the past week now.  Today he presents with malaise and nausea.  He states that he is not feeling well at all.  He presents for further treatment and evaluation  Past Medical History:  Diagnosis Date  . Back pain   . Diabetes mellitus   . Headache(784.0)    general  . Hyperlipidemia   . Hypertension   . Osteomyelitis ([place]) 11/2018   RIGHT FOOT      Objective/Physical Exam General: The patient is alert and oriented x3 in no acute distress.  Dermatology:  Wound #1 noted to the amputation foot stump right foot measuring approximately 3.0x3.0 x 0.2 cm (LxWxD).  The actual ulcer to the distal stump of the foot appears stable.  No drainage noted.  Wound base is mostly granular  There is a new finding today of a draining abscess to the plantar aspect of the right heel.  Purulent drainage noted.  Mild malodor noted.  Vascular: Palpable pedal pulses bilaterally.  Increased edema and erythema extending up to the level of the ankle.  Foot is warm to touch  Neurological: Epicritic and protective threshold diminished bilaterally.   Musculoskeletal Exam: h/o right foot TMA  Assessment: 1.  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 diabetes mellitus w/ peripheral neuropathy 3.  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  Draining abscess right heel 5.  Cellulitis right foot and ankle   Plan of Care:  1. Patient was evaluated.   2.  Since the patient has been nauseous and not feeling well at all, also given his PMHx of uncontrolled DM, recommend that he goes immediately to the emergency department for further work-up and likely admission.  Patient understands and states that he will go to the  [facility] 3.  Today cultures were taken and sent to pathology for culture and sensitivity 4.  Please consult podiatry once patient is admitted so podiatry can follow inpatient. Once admitted, recommend MRI right foot to determine the extent of the abscess.  Patient understands that he will likely need surgical incision and drainage with debridement right foot while inpatient.   *Wife's name is Lezlie Octave, DPM [facility]  Dr. Edrick Kins, DPM    [address], [place] [address]                Office [phone]  Fax [phone]

## 2021-01-05 ENCOUNTER — Inpatient Hospital Stay (HOSPITAL_COMMUNITY): Payer: BC Managed Care – PPO

## 2021-01-05 DIAGNOSIS — M86671 Other chronic osteomyelitis, right ankle and foot: Secondary | ICD-10-CM

## 2021-01-05 DIAGNOSIS — M86171 Other acute osteomyelitis, right ankle and foot: Secondary | ICD-10-CM | POA: Diagnosis not present

## 2021-01-05 LAB — SURGICAL PCR SCREEN
MRSA, PCR: NEGATIVE
Staphylococcus aureus: NEGATIVE

## 2021-01-05 LAB — BASIC METABOLIC PANEL
Anion gap: 11 (ref 5–15)
BUN: 56 mg/dL — ABNORMAL HIGH (ref 8–23)
CO2: 23 mmol/L (ref 22–32)
Calcium: 7.2 mg/dL — ABNORMAL LOW (ref 8.9–10.3)
Chloride: 94 mmol/L — ABNORMAL LOW (ref 98–111)
Creatinine, Ser: 4.05 mg/dL — ABNORMAL HIGH (ref 0.61–1.24)
GFR, Estimated: 16 mL/min — ABNORMAL LOW (ref >60)
Glucose, Bld: 127 mg/dL — ABNORMAL HIGH (ref 70–99)
Potassium: 3.2 mmol/L — ABNORMAL LOW (ref 3.5–5.1)
Sodium: 128 mmol/L — ABNORMAL LOW (ref 135–145)

## 2021-01-05 LAB — MAGNESIUM: Magnesium: 1.9 mg/dL (ref 1.7–2.4)

## 2021-01-05 LAB — HEPATIC FUNCTION PANEL
ALT: 92 U/L — ABNORMAL HIGH (ref 0–44)
AST: 149 U/L — ABNORMAL HIGH (ref 15–41)
Albumin: 1.8 g/dL — ABNORMAL LOW (ref 3.5–5.0)
Alkaline Phosphatase: 370 U/L — ABNORMAL HIGH (ref 38–126)
Bilirubin, Direct: 1.1 mg/dL — ABNORMAL HIGH (ref 0.0–0.2)
Indirect Bilirubin: 0.5 mg/dL (ref 0.3–0.9)
Total Bilirubin: 1.6 mg/dL — ABNORMAL HIGH (ref 0.3–1.2)
Total Protein: 5.3 g/dL — ABNORMAL LOW (ref 6.5–8.1)

## 2021-01-05 LAB — GLUCOSE, CAPILLARY
Glucose-Capillary: 101 mg/dL — ABNORMAL HIGH (ref 70–99)
Glucose-Capillary: 103 mg/dL — ABNORMAL HIGH (ref 70–99)
Glucose-Capillary: 167 mg/dL — ABNORMAL HIGH (ref 70–99)
Glucose-Capillary: 185 mg/dL — ABNORMAL HIGH (ref 70–99)
Glucose-Capillary: 198 mg/dL — ABNORMAL HIGH (ref 70–99)

## 2021-01-05 LAB — CBC
HCT: 24.7 % — ABNORMAL LOW (ref 39.0–52.0)
Hemoglobin: 8.1 g/dL — ABNORMAL LOW (ref 13.0–17.0)
MCH: 30.8 pg (ref 26.0–34.0)
MCHC: 32.8 g/dL (ref 30.0–36.0)
MCV: 93.9 fL (ref 80.0–100.0)
Platelets: 196 10*3/uL (ref 150–400)
RBC: 2.63 MIL/uL — ABNORMAL LOW (ref 4.22–5.81)
RDW: 13.2 % (ref 11.5–15.5)
WBC: 15.3 10*3/uL — ABNORMAL HIGH (ref 4.0–10.5)
nRBC: 0 % (ref 0.0–0.2)

## 2021-01-05 LAB — VITAMIN B12: Vitamin B-12: 1664 pg/mL — ABNORMAL HIGH (ref 180–914)

## 2021-01-05 LAB — IRON AND TIBC
Iron: 15 ug/dL — ABNORMAL LOW (ref 45–182)
Saturation Ratios: 8 % — ABNORMAL LOW (ref 17.9–39.5)
TIBC: 198 ug/dL — ABNORMAL LOW (ref 250–450)
UIBC: 183 ug/dL

## 2021-01-05 LAB — FOLATE: Folate: 22.9 ng/mL (ref >5.9)

## 2021-01-05 LAB — HIV ANTIBODY (ROUTINE TESTING W REFLEX): HIV Screen 4th Generation wRfx: NONREACTIVE

## 2021-01-05 LAB — LACTIC ACID, PLASMA: Lactic Acid, Venous: 1.7 mmol/L (ref 0.5–1.9)

## 2021-01-05 MED ORDER — VANCOMYCIN HCL 1250 MG/250ML IV SOLN
1250.0000 mg | Freq: Once | INTRAVENOUS | Status: DC
  Filled 2021-01-05: qty 250

## 2021-01-05 MED ORDER — OXYCODONE HCL 5 MG PO TABS
5.0000 mg | ORAL_TABLET | Freq: Four times a day (QID) | ORAL | Status: DC | PRN
  Administered 2021-01-05 (×2): 5 mg via ORAL
  Filled 2021-01-05 (×2): qty 1

## 2021-01-05 MED ORDER — OXYCODONE HCL 5 MG PO TABS
5.0000 mg | ORAL_TABLET | ORAL | Status: DC | PRN
  Administered 2021-01-05 – 2021-01-07 (×7): 5 mg via ORAL
  Filled 2021-01-05 (×7): qty 1

## 2021-01-05 MED ORDER — VANCOMYCIN VARIABLE DOSE PER UNSTABLE RENAL FUNCTION (PHARMACIST DOSING): Status: DC

## 2021-01-05 MED ORDER — POTASSIUM CHLORIDE CRYS ER 20 MEQ PO TBCR
40.0000 meq | EXTENDED_RELEASE_TABLET | Freq: Once | ORAL | Status: AC
  Administered 2021-01-05: 40 meq via ORAL
  Filled 2021-01-05: qty 2

## 2021-01-05 MED ORDER — POLYETHYLENE GLYCOL 3350 17 G PO PACK: 17.0000 g | PACK | Freq: Every day | ORAL | Status: DC | PRN

## 2021-01-05 MED ORDER — SODIUM CHLORIDE 0.9 % IV SOLN
800.0000 mg | INTRAVENOUS | Status: DC
  Administered 2021-01-07: 800 mg via INTRAVENOUS
  Filled 2021-01-05: qty 16

## 2021-01-05 MED ORDER — FENTANYL CITRATE (PF) 100 MCG/2ML IJ SOLN
12.5000 ug | INTRAMUSCULAR | Status: DC | PRN
  Administered 2021-01-07 – 2021-01-08 (×4): 12.5 ug via INTRAVENOUS
  Filled 2021-01-05 (×4): qty 2

## 2021-01-05 MED ORDER — SODIUM CHLORIDE 0.9 % IV SOLN
800.0000 mg | Freq: Once | INTRAVENOUS | Status: AC
  Administered 2021-01-05: 800 mg via INTRAVENOUS
  Filled 2021-01-05: qty 16

## 2021-01-05 MED ORDER — SENNOSIDES-DOCUSATE SODIUM 8.6-50 MG PO TABS
1.0000 | ORAL_TABLET | Freq: Every evening | ORAL | Status: DC | PRN
  Administered 2021-01-05 – 2021-01-08 (×2): 1 via ORAL
  Filled 2021-01-05 (×2): qty 1

## 2021-01-05 MED ORDER — SODIUM CHLORIDE 0.9 % IV SOLN: INTRAVENOUS | Status: DC

## 2021-01-05 NOTE — Progress Notes (Signed)
PROGRESS NOTE    Fernando Anthony  [id number] DOB: January 22, 1956 DOA: 01/04/2021 PCP: Curly Rim, MD     Brief Narrative:  Fernando Anthony is a 65 year old male with past medical history significant for uncontrolled insulin-dependent type 2 diabetes, osteomyelitis of the right foot status post transmetatarsal amputation, hypertension who presents with concerns of right plantar ulcer osteomyelitis.  He was evaluated by Dr. Amalia Hailey, podiatry, in the office and was sent to the emergency department for MRI and anticipation of surgical debridement.  New events last 24 hours / Subjective: Patient states that over the past several days, he has had nausea, vomiting and poor oral intake.  He denies any fevers but admits to chills.  Denies any chest pain, shortness of breath or cough.  Also admits to decreased urine output over the past several days.  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 -Sed rate 129, CRP 19.3  -Wound culture pending  -Blood culture pending  -Hold vanco due to renal function. Continue daptomycin and rocephin -WBC improving  -Podiatry consulted, Dr. Amalia Hailey aware  AKI -FeNa 0.4%, consistent with prerenal injury   -Renal US -IVF  Uncontrolled type 2 DM  -Ha1c pending  -Continue lantus, SSI  HTN -Continue amlodipine, atenolol, clonidine, hydralazine -Hold Lasix, HCTZ, lisinopril due to AKI  HLD -Continue crestor   Hypokalemia -Replaced, trend  Elevated LFT -Improving with IVF   Iron deficiency -Hold off on supplementation IV with  acute infectious process    DVT prophylaxis:  SCDs Start: 01/04/21 1945  Code Status: Full Family Communication: None at bedside Disposition Plan:  Status is: Inpatient  Remains inpatient appropriate because:Ongoing diagnostic testing needed not appropriate for outpatient work up   Dispo: The patient is from: Home              Anticipated d/c is to: Home              Anticipated d/c date is: 3 days              Patient currently is not medically stable to d/c.  Remains on IV antibiotic.  Podiatry consult pending   Difficult to place patient No      Consultants:   Podiatry, Dr. Amalia Hailey  Procedures:   None   Antimicrobials:  Anti-infectives (From admission, onward)   Start     Dose/Rate Route Frequency Ordered Stop   01/07/21 2000  DAPTOmycin (CUBICIN) 800 mg in sodium chloride 0.9 % IVPB        800 mg 232 mL/hr over 30 Minutes Intravenous Every 48 hours 01/05/21 0809     01/05/21 1000  DAPTOmycin (CUBICIN) 800 mg in sodium chloride 0.9 % IVPB        800 mg 232 mL/hr over 30 Minutes Intravenous  Once 01/05/21 0809     01/05/21 0900  vancomycin (VANCOREADY) IVPB 1250 mg/250 mL  Status:  Discontinued        1,250 mg 166.7 mL/hr over 90 Minutes Intravenous  Once 01/05/21 0741 01/05/21 0748   01/05/21 0741  vancomycin variable dose per unstable renal function (pharmacist  dosing)  Status:  Discontinued         Does not apply See admin instructions 01/05/21 0741 01/05/21 0748   01/04/21 2000  cefTRIAXone (ROCEPHIN) 2 g in sodium chloride 0.9 % 100 mL IVPB        2 g 200 mL/hr over 30 Minutes Intravenous Every 24 hours 01/04/21 1944     01/04/21 1700  vancomycin (VANCOCIN) IVPB 1000 mg/200 mL premix        1,000 mg 200 mL/hr over 60 Minutes Intravenous  Once 01/04/21 1650 01/04/21 1900        Objective: Vitals:   01/04/21 2244 01/05/21 0124 01/05/21 0606 01/05/21 0931  BP:  117/69 113/66 114/64  Pulse:  65 (!) 58 60  Resp:  '17 17 16  '$ Temp:  99 F (37.2 C) 98.6 F  (37 C) 99 F (37.2 C)  TempSrc:  Oral Oral Oral  SpO2:  91% 90% 91%  Weight: 101.9 kg     Height: '6\' 2"'$  (1.88 m)       Intake/Output Summary (Last 24 hours) at 01/05/2021 1108 Last data filed at 01/05/2021 0958 Gross per 24 hour  Intake 2336.81 ml  Output 470 ml  Net 1866.81 ml   Filed Weights   01/04/21 1741 01/04/21 2244  Weight: 97.5 kg 101.9 kg    Examination:  General exam: Appears calm and comfortable  Respiratory system: Clear to auscultation. Respiratory effort normal. No respiratory distress. No conversational dyspnea.  Cardiovascular system: S1 & S2 heard, RRR. No murmurs. No pedal edema. Gastrointestinal system: Abdomen is nondistended, soft and nontender. Normal bowel sounds heard. Central nervous system: Alert and oriented. No focal neurological deficits. Speech clear.  Extremities: Right foot s/p TMA  Skin: +plantar ulcers present at right heel and ball of foot   Psychiatry: Judgement and insight appear normal. Mood & affect appropriate.   Data Reviewed: I have personally reviewed following labs and imaging studies  CBC: Recent Labs  Lab 01/04/21 1604 01/05/21 0338  WBC 25.3* 15.3*  NEUTROABS 23.3*  --   HGB 9.9* 8.1*  HCT 30.6* 24.7*  MCV 96.2 93.9  PLT 240 [id number]   Basic Metabolic Panel: Recent Labs  Lab 01/04/21 1604 01/05/21 0338 01/05/21 0355  NA 129* 128*  --   K 3.4* 3.2*  --   CL 91* 94*  --   CO2 24 23  --   GLUCOSE 239* 127*  --   BUN 56* 56*  --   CREATININE 3.94* 4.05*  --   CALCIUM 7.9* 7.2*  --   MG  --   --  1.9   GFR: Estimated Creatinine Clearance: 23.8 mL/min (A) (by C-G formula based on SCr of 4.05 mg/dL (H)). Liver Function Tests: Recent Labs  Lab 01/04/21 1604 01/05/21 0355  AST 218* 149*  ALT 116* 92*  ALKPHOS 409* 370*  BILITOT 2.1* 1.6*  PROT 6.4* 5.3*  ALBUMIN 2.3* 1.8*   No results for input(s): LIPASE, AMYLASE in the last 168 hours. No results for input(s): AMMONIA in the last 168 hours. Coagulation  Profile: Recent Labs  Lab 01/04/21 1649  INR 1.2   Cardiac Enzymes: No results for input(s): CKTOTAL, CKMB, CKMBINDEX, TROPONINI in the last 168 hours. BNP (last 3 results) No results for input(s): PROBNP in the last 8760 hours. HbA1C: No results for input(s): HGBA1C in the last 72 hours. CBG: Recent Labs  Lab 01/04/21 2234 01/05/21 0605 01/05/21 0719  GLUCAP 168* 101* 103*   Lipid Profile: No  results for input(s): CHOL, HDL, LDLCALC, TRIG, CHOLHDL, LDLDIRECT in the last 72 hours. Thyroid Function Tests: No results for input(s): TSH, T4TOTAL, FREET4, T3FREE, THYROIDAB in the last 72 hours. Anemia Panel: Recent Labs    01/05/21 0338  VITAMINB12 1,664*  FOLATE 22.9  TIBC 198*  IRON 15*   Sepsis Labs: Recent Labs  Lab 01/04/21 1604 01/04/21 1802 01/04/21 2015 01/04/21 2253  LATICACIDVEN 2.6* 3.0* 1.4 1.7    Recent Results (from the past 240 hour(s))  Aerobic Culture w Gram Stain (superficial specimen)     Status: None (Preliminary result)   Collection Time: 01/04/21  5:19 PM   Specimen: Foot; Wound  Result Value Ref Range Status   Specimen Description   Final    FOOT RIGHT Performed at [facility] [address] [place], [place] [address]    Special Requests   Final    NONE Performed at [facility], [place] [address], [place], [place] [address]    Gram Stain   Final    MODERATE WBC PRESENT, PREDOMINANTLY PMN ABUNDANT GRAM POSITIVE COCCI ABUNDANT GRAM NEGATIVE RODS MODERATE GRAM POSITIVE RODS Performed at [facility], [place] [address], [place], [place] [address]    Culture PENDING  Incomplete   Report Status PENDING  Incomplete  Resp Panel by RT-PCR (Flu A&B, Covid) Nasopharyngeal Swab     Status: None   Collection Time: 01/04/21  6:05 PM   Specimen: Nasopharyngeal Swab; Nasopharyngeal(NP) swabs in vial transport medium  Result Value Ref Range Status   SARS Coronavirus 2 by RT PCR NEGATIVE NEGATIVE Final     Comment: (NOTE) SARS-CoV-2 target nucleic acids are NOT DETECTED.  The SARS-CoV-2 RNA is generally detectable in upper respiratory specimens during the acute phase of infection. The lowest concentration of SARS-CoV-2 viral copies this assay can detect is 138 copies/mL. A negative result does not preclude SARS-Cov-2 infection and should not be used as the sole basis for treatment or other patient management decisions. A negative result may occur with  improper specimen collection/handling, submission of specimen other than nasopharyngeal swab, presence of viral mutation(s) within the areas targeted by this assay, and inadequate number of viral copies(<138 copies/mL). A negative result must be combined with clinical observations, patient history, and epidemiological information. The expected result is Negative.  Fact Sheet for Patients:  EntrepreneurPulse.com.au  Fact Sheet for Healthcare Providers:  IncredibleEmployment.be  This test is no t yet approved or cleared by the [place] FDA and  has been authorized for detection and/or diagnosis of SARS-CoV-2 by FDA under an Emergency Use Authorization (EUA). This EUA will remain  in effect (meaning this test can be used) for the duration of the COVID-19 declaration under Section 564(b)(1) of the Act, 21 U.S.C.section 360bbb-3(b)(1), unless the authorization is terminated  or revoked sooner.       Influenza A by PCR NEGATIVE NEGATIVE Final   Influenza B by PCR NEGATIVE NEGATIVE Final    Comment: (NOTE) The Xpert Xpress SARS-CoV-2/FLU/RSV plus assay is intended as an aid in the diagnosis of influenza from Nasopharyngeal swab specimens and should not be used as a sole basis for treatment. Nasal washings and aspirates are unacceptable for Xpert Xpress SARS-CoV-2/FLU/RSV testing.  Fact Sheet for Patients: EntrepreneurPulse.com.au  Fact Sheet for Healthcare  Providers: IncredibleEmployment.be  This test is not yet approved or cleared by the [place] FDA and has been authorized for detection and/or diagnosis of SARS-CoV-2 by FDA under an Emergency Use Authorization (EUA).  This EUA will remain in effect (meaning this test can be used) for the duration of the COVID-19 declaration under Section 564(b)(1) of the Act, 21 U.S.C. section 360bbb-3(b)(1), unless the authorization is terminated or revoked.  Performed at [facility], [place] [address] [place], [place] [address]   Surgical PCR screen     Status: None   Collection Time: 01/04/21 10:41 PM   Specimen: Nasal Mucosa; Nasal Swab  Result Value Ref Range Status   MRSA, PCR NEGATIVE NEGATIVE Final   Staphylococcus aureus NEGATIVE NEGATIVE Final    Comment: (NOTE) The Xpert SA Assay (FDA approved for NASAL specimens in patients 68 years of age and older), is one component of a comprehensive surveillance program. It is not intended to diagnose infection nor to guide or monitor treatment. Performed at [facility], [place] [address], [place], [place] [address]       Radiology Studies: DG Chest 2 View  Result Date: 01/04/2021 CLINICAL DATA:  Diabetic foot wound EXAM: CHEST - 2 VIEW COMPARISON:  12/28/2014 FINDINGS: Heart size is upper limits of normal. Mild pulmonary vascular congestion. No focal airspace consolidation, pleural effusion, or pneumothorax. IMPRESSION: Mild pulmonary vascular congestion. No focal airspace consolidation. Electronically Signed   By: Davina Poke D.O.   On: 01/04/2021 18:09   MR FOOT RIGHT W WO CONTRAST  Result Date: 01/05/2021 CLINICAL DATA:  Chronic heel ulcer. EXAM: MRI OF THE RIGHT FOREFOOT WITHOUT AND WITH CONTRAST TECHNIQUE: Multiplanar, multisequence MR imaging of the right foot was performed before and after the administration of intravenous contrast. CONTRAST:  10 cc Gadavist COMPARISON:   Radiographs, same date. FINDINGS: Surgical changes related to a prior transmetatarsal amputation. There is an open wound involving the plantar aspect of the heel with gas noted in the soft tissues. I do not see a discrete rim enhancing drainable soft tissue abscess. Nonenhancing soft tissue is suggestive of tissue necrosis. Diffuse surrounding cellulitis and myositis. No definite findings for pyomyositis. The plantar fascia is thickened and demonstrates inflammatory changes. No discrete tear/rupture. Abnormal signal intensity and enhancement along the plantar cortex of the calcaneus consistent with early osteomyelitis. Markedly thickened Achilles tendon consistent with chronic tendinopathy. No findings suspicious for septic arthritis. IMPRESSION: 1. Open wound involving the plantar aspect of the heel with gas noted in the soft tissues. Nonenhancing soft tissue is suggestive of tissue necrosis. No discrete rim enhancing drainable soft tissue abscess. 2. Abnormal signal intensity and enhancement along the plantar cortex of the calcaneus consistent with early osteomyelitis. 3. Diffuse cellulitis and myositis. 4. Markedly thickened Achilles tendon consistent with chronic tendinopathy. Electronically Signed   By: Marijo Sanes M.D.   On: 01/05/2021 08:00   DG Foot Complete Right  Result Date: 01/04/2021 CLINICAL DATA:  Nonhealing foot wound EXAM: RIGHT FOOT COMPLETE - 3+ VIEW COMPARISON:  07/03/2019 FINDINGS: Patient is status post transmetatarsal amputation of the first-fifth rays. Resection margins appear preserved without evidence of a new erosion. There is ulceration plantar aspect of the heel underlying the calcaneal tuberosity. Cortical irregularity of the plantar surface of the posterior calcaneus near the plantar fascia attachment is suspicious for acute osteomyelitis. Air within the soft tissues underlying the site of ulceration suspicious for sinus tract. Vascular calcifications are present. IMPRESSION: 1.  Cortical irregularity of the plantar surface of the posterior calcaneus is suspicious for acute osteomyelitis. 2. Plantar foot ulceration with suspected sinus tract extending to the underlying calcaneus. Electronically Signed   By: Davina Poke D.O.  On: 01/04/2021 18:08      Scheduled Meds: . amLODipine  10 mg Oral Daily  . aspirin EC  81 mg Oral QHS  . atenolol  50 mg Oral Daily  . cloNIDine  0.1 mg Oral BID  . hydrALAZINE  12.5 mg Oral BID  . insulin aspart  0-15 Units Subcutaneous TID WC  . insulin aspart  0-5 Units Subcutaneous QHS  . insulin glargine  30 Units Subcutaneous QHS  . omega-3 acid ethyl esters  1 g Oral Daily  . pregabalin  50 mg Oral TID  . rosuvastatin  20 mg Oral Daily   Continuous Infusions: . sodium chloride 150 mL/hr at 01/05/21 0629  . cefTRIAXone (ROCEPHIN)  IV 2 g (01/04/21 2317)  . DAPTOmycin (CUBICIN)  IV    . [START ON 01/07/2021] DAPTOmycin (CUBICIN)  IV       LOS: 1 day      Time spent: 25 minutes   Dessa Phi, DO [facility] 01/05/2021, 11:08 AM   Available via Epic secure chat 7am-7pm After these hours, please refer to coverage provider listed on [email or web address]

## 2021-01-05 NOTE — Progress Notes (Signed)
I have reviewed and concur with students documentation.  

## 2021-01-05 NOTE — Progress Notes (Signed)
Pharmacy Antibiotic Note  Fernando Anthony is a 65 y.o. male admitted on 01/04/2021 with osteomyelitis.  Pharmacy has been consulted for daptomycin dosing. Pt has also been started on ceftriaxone.    Pt in AKI. Therefore will start daptomycin over vancomycin.    Plan: Daptomycin 800 mg IV q48h   Ceftriaxone 2 gr IV q24h  Weekly CK labs ordered   Height: '6\' 2"'$  (188 cm) Weight: 101.9 kg (224 lb 10.4 oz) IBW/kg (Calculated) : 82.2  Temp (24hrs), Avg:99.1 F (37.3 C), Min:98.6 F (37 C), Max:99.8 F (37.7 C)  Recent Labs  Lab 01/04/21 1604 01/04/21 1802 01/04/21 2015 01/04/21 2253 01/05/21 0338  WBC 25.3*  --   --   --  15.3*  CREATININE 3.94*  --   --   --  4.05*  LATICACIDVEN 2.6* 3.0* 1.4 1.7  --     Estimated Creatinine Clearance: 23.8 mL/min (A) (by C-G formula based on SCr of 4.05 mg/dL (H)).    Allergies  Allergen Reactions  . Vicodin [Hydrocodone-Acetaminophen] Itching    Antimicrobials this admission: 2/14 vancomycin x1  2/14 ceftiaxone >>  2/15 daptomycin >>   Dose adjustments this admission:   Microbiology results: 2/14 BCx:  2/14 right foot culture:  abundant GPC, abundant GNR, moderate GPR     Thank you for allowing pharmacy to be a part of this patient's care.   Royetta Asal, PharmD, BCPS 01/05/2021 8:15 AM

## 2021-01-05 NOTE — Consult Note (Signed)
PODIATRY CONSULTATION  Fernando Anthony           [id number]  DOB: 18-Jun-1956  DOA: 01/04/2021 PCP: Curly Rim, MD     Reason for consult: Cellulitis right foot.  Possible OM right foot  HPI: 65 y.o. male PMHx uncontrolled IDDM type II, HTN, cellulitis right foot, PSxHx transmetatarsal amputation right, admitted for worsening infection right foot.  [name] 01/04/2021 for worsening infection over the past week.  Patient was feeling malaise with nausea vomiting and recommended he go straight to the emergency department for admission and IV antibiotics.  He presents this evening with increased appetite, and pain due to the right foot infection resting in bed.  Wife, Fernando Anthony, present during consultation this evening.  Past Medical History:  Diagnosis Date  . Back pain   . Diabetes mellitus   . Headache(784.0)    general  . Hyperlipidemia   . Hypertension   . Osteomyelitis ([place]) 11/2018   RIGHT FOOT   CBC Latest Ref Rng & Units 01/05/2021 01/04/2021 11/29/2019  WBC 4.0 - 10.5 K/uL 15.3(H) 25.3(H) 12.4(H)  Hemoglobin 13.0 - 17.0 g/dL 8.1(L) 9.9(L) 13.6  Hematocrit 39.0 - 52.0 % 24.7(L) 30.6(L) 41.6  Platelets 150 - 400 K/uL 196 240 202   BMP Latest Ref Rng & Units 01/05/2021 01/04/2021 11/29/2019  Glucose 70 - 99 mg/dL 127(H) 239(H) 394(H)  BUN 8 - 23 mg/dL 56(H) 56(H) 24(H)  Creatinine 0.61 - 1.24 mg/dL 4.05(H) 3.94(H) 1.01  Sodium 135 - 145 mmol/L 128(L) 129(L) 132(L)  Potassium 3.5 - 5.1 mmol/L 3.2(L) 3.4(L) 3.9  Chloride 98 - 111 mmol/L 94(L) 91(L) 98  CO2 22 - 32 mmol/L '23 24 30  '$ Calcium 8.9 - 10.3 mg/dL 7.2(L) 7.9(L) 7.9(L)     Physical Exam: General: The patient is alert and oriented x3 in no acute distress.  Dermatology: Chronic ulcer distal portion right foot amputation stump.  Granular wound base.  Minimal drainage noted.  Appears very chronic and stable.  Draining sinus/ulcer noted to the plantar aspect of the heel right.  Maceration noted.   There is no significant malodor noted.  Purulent serous drainage.  Vascular: Palpable pedal pulses bilaterally.  Edema and erythema noted extending up to the level of the ankle.  Neurological: Epicritic and protective threshold diminished bilaterally.   Musculoskeletal Exam: H/o right foot TMA.  MRI impression 01/05/2021 right foot: 1. Open wound involving the plantar aspect of the heel with gas noted in the soft tissues. Nonenhancing soft tissue is suggestive of tissue necrosis. No discrete rim enhancing drainable soft tissue abscess. 2. Abnormal signal intensity and enhancement along the plantar cortex of the calcaneus consistent with early osteomyelitis. 3. Diffuse cellulitis and myositis. 4. Markedly thickened Achilles tendon consistent with chronic tendinopathy.  Assessment/plan of care: 1.  Worsening infection/cellulitis right foot and ankle with draining abscess right heel  -Patient's chart and MRI reviewed today.  Discussed with patient and his wife today the need for surgical incision and drainage to allow draining of the wound and bone biopsy cultures.  Patient agrees and would like to proceed with surgery.  Planning for surgical incision and and drainage with irrigation debridement.  Bone biopsy calcaneus right.  Preoperative orders placed today.  Surgery tentatively planned for 01/06/2021 after 5PM.   -N.p.o. after full breakfast  -Pain management modified today verbally with the nurse.  Oxycodone 5 mg every 4 hours as needed pain.  Patient was in significant amount of pain upon presentation  this evening  -Dressing orders placed.  Change daily.  Pain with Betadine and apply 4 x 4 gauze, large Kerlix, Ace wrap  -Continue IV antibiotics as per admitting physician   -podiatry will follow   *Thank you for the consult. Please contact me directly with any questions or concerns. Cell [phone].   Edrick Kins, DPM [facility]  Dr. Edrick Kins, DPM    [address], [place] [address]                Office [phone]  Fax [phone]

## 2021-01-06 ENCOUNTER — Encounter (HOSPITAL_COMMUNITY)
Admission: EM | Disposition: A | Discharge status: Rehabilitation Facility | Payer: Self-pay | Source: Home / Self Care | Attending: Internal Medicine

## 2021-01-06 ENCOUNTER — Inpatient Hospital Stay (HOSPITAL_COMMUNITY): Payer: BC Managed Care – PPO | Admitting: Anesthesiology

## 2021-01-06 ENCOUNTER — Encounter (HOSPITAL_COMMUNITY): Payer: Self-pay | Admitting: Family Medicine

## 2021-01-06 ENCOUNTER — Inpatient Hospital Stay (HOSPITAL_COMMUNITY): Payer: BC Managed Care – PPO

## 2021-01-06 DIAGNOSIS — E871 Hypo-osmolality and hyponatremia: Secondary | ICD-10-CM | POA: Diagnosis not present

## 2021-01-06 DIAGNOSIS — R7989 Other specified abnormal findings of blood chemistry: Secondary | ICD-10-CM

## 2021-01-06 DIAGNOSIS — M86171 Other acute osteomyelitis, right ankle and foot: Secondary | ICD-10-CM | POA: Diagnosis not present

## 2021-01-06 DIAGNOSIS — R945 Abnormal results of liver function studies: Secondary | ICD-10-CM | POA: Diagnosis not present

## 2021-01-06 DIAGNOSIS — N179 Acute kidney failure, unspecified: Secondary | ICD-10-CM | POA: Diagnosis not present

## 2021-01-06 DIAGNOSIS — M86671 Other chronic osteomyelitis, right ankle and foot: Secondary | ICD-10-CM | POA: Diagnosis not present

## 2021-01-06 HISTORY — PX: INCISION AND DRAINAGE: SHX5863

## 2021-01-06 HISTORY — PX: BONE BIOPSY: SHX375

## 2021-01-06 LAB — COMPREHENSIVE METABOLIC PANEL
ALT: 133 U/L — ABNORMAL HIGH (ref 0–44)
AST: 243 U/L — ABNORMAL HIGH (ref 15–41)
Albumin: 2 g/dL — ABNORMAL LOW (ref 3.5–5.0)
Alkaline Phosphatase: 612 U/L — ABNORMAL HIGH (ref 38–126)
Anion gap: 10 (ref 5–15)
BUN: 66 mg/dL — ABNORMAL HIGH (ref 8–23)
CO2: 22 mmol/L (ref 22–32)
Calcium: 7.4 mg/dL — ABNORMAL LOW (ref 8.9–10.3)
Chloride: 99 mmol/L (ref 98–111)
Creatinine, Ser: 4.52 mg/dL — ABNORMAL HIGH (ref 0.61–1.24)
GFR, Estimated: 14 mL/min — ABNORMAL LOW (ref >60)
Glucose, Bld: 133 mg/dL — ABNORMAL HIGH (ref 70–99)
Potassium: 3.5 mmol/L (ref 3.5–5.1)
Sodium: 131 mmol/L — ABNORMAL LOW (ref 135–145)
Total Bilirubin: 2.8 mg/dL — ABNORMAL HIGH (ref 0.3–1.2)
Total Protein: 6 g/dL — ABNORMAL LOW (ref 6.5–8.1)

## 2021-01-06 LAB — CBC
HCT: 28.9 % — ABNORMAL LOW (ref 39.0–52.0)
Hemoglobin: 9.5 g/dL — ABNORMAL LOW (ref 13.0–17.0)
MCH: 30.8 pg (ref 26.0–34.0)
MCHC: 32.9 g/dL (ref 30.0–36.0)
MCV: 93.8 fL (ref 80.0–100.0)
Platelets: 224 10*3/uL (ref 150–400)
RBC: 3.08 MIL/uL — ABNORMAL LOW (ref 4.22–5.81)
RDW: 13.7 % (ref 11.5–15.5)
WBC: 12.9 10*3/uL — ABNORMAL HIGH (ref 4.0–10.5)
nRBC: 0.2 % (ref 0.0–0.2)

## 2021-01-06 LAB — HEMOGLOBIN A1C
Hgb A1c MFr Bld: 10.5 % — ABNORMAL HIGH (ref 4.8–5.6)
Mean Plasma Glucose: 255 mg/dL

## 2021-01-06 LAB — VANCOMYCIN, RANDOM: Vancomycin Rm: 10

## 2021-01-06 LAB — GLUCOSE, CAPILLARY
Glucose-Capillary: 68 mg/dL — ABNORMAL LOW (ref 70–99)
Glucose-Capillary: 101 mg/dL — ABNORMAL HIGH (ref 70–99)
Glucose-Capillary: 78 mg/dL (ref 70–99)
Glucose-Capillary: 70 mg/dL (ref 70–99)
Glucose-Capillary: 92 mg/dL (ref 70–99)
Glucose-Capillary: 62 mg/dL — ABNORMAL LOW (ref 70–99)
Glucose-Capillary: 66 mg/dL — ABNORMAL LOW (ref 70–99)
Glucose-Capillary: 131 mg/dL — ABNORMAL HIGH (ref 70–99)
Glucose-Capillary: 142 mg/dL — ABNORMAL HIGH (ref 70–99)

## 2021-01-06 LAB — CK: Total CK: 65 U/L (ref 49–397)

## 2021-01-06 SURGERY — INCISION AND DRAINAGE
Anesthesia: General | Site: Foot | Laterality: Right

## 2021-01-06 MED ORDER — DEXAMETHASONE SODIUM PHOSPHATE 10 MG/ML IJ SOLN
INTRAMUSCULAR | Status: DC | PRN
  Administered 2021-01-06: 5 mg via INTRAVENOUS

## 2021-01-06 MED ORDER — FENTANYL CITRATE (PF) 100 MCG/2ML IJ SOLN
INTRAMUSCULAR | Status: AC
  Filled 2021-01-06: qty 2

## 2021-01-06 MED ORDER — PHENYLEPHRINE 40 MCG/ML (10ML) SYRINGE FOR IV PUSH (FOR BLOOD PRESSURE SUPPORT)
PREFILLED_SYRINGE | INTRAVENOUS | Status: DC | PRN
  Administered 2021-01-06: 80 ug via INTRAVENOUS
  Administered 2021-01-06: 40 ug via INTRAVENOUS
  Administered 2021-01-06: 80 ug via INTRAVENOUS

## 2021-01-06 MED ORDER — DEXTROSE 50 % IV SOLN
INTRAVENOUS | Status: AC
  Filled 2021-01-06: qty 50

## 2021-01-06 MED ORDER — ACETAMINOPHEN 325 MG PO TABS
650.0000 mg | ORAL_TABLET | Freq: Four times a day (QID) | ORAL | Status: DC | PRN
  Administered 2021-01-06 – 2021-01-10 (×2): 650 mg via ORAL
  Filled 2021-01-06 (×2): qty 2

## 2021-01-06 MED ORDER — PROPOFOL 10 MG/ML IV BOLUS
INTRAVENOUS | Status: DC | PRN
  Administered 2021-01-06: 200 mg via INTRAVENOUS

## 2021-01-06 MED ORDER — BUPIVACAINE HCL (PF) 0.5 % IJ SOLN
INTRAMUSCULAR | Status: DC | PRN
  Administered 2021-01-06: 30 mL

## 2021-01-06 MED ORDER — CHLORHEXIDINE GLUCONATE 0.12 % MT SOLN
15.0000 mL | OROMUCOSAL | Status: AC
  Administered 2021-01-06: 15 mL via OROMUCOSAL

## 2021-01-06 MED ORDER — ONDANSETRON HCL 4 MG/2ML IJ SOLN
INTRAMUSCULAR | Status: DC | PRN
  Administered 2021-01-06: 4 mg via INTRAVENOUS

## 2021-01-06 MED ORDER — PROMETHAZINE HCL 25 MG/ML IJ SOLN: 6.2500 mg | INTRAMUSCULAR | Status: DC | PRN

## 2021-01-06 MED ORDER — LIDOCAINE HCL (PF) 2 % IJ SOLN
INTRAMUSCULAR | Status: AC
  Filled 2021-01-06: qty 5

## 2021-01-06 MED ORDER — LACTATED RINGERS IV SOLN: Freq: Once | INTRAVENOUS | Status: AC

## 2021-01-06 MED ORDER — BUPIVACAINE HCL (PF) 0.5 % IJ SOLN
INTRAMUSCULAR | Status: AC
  Filled 2021-01-06: qty 30

## 2021-01-06 MED ORDER — DEXTROSE 50 % IV SOLN: 25.0000 mL | INTRAVENOUS | Status: DC

## 2021-01-06 MED ORDER — INSULIN GLARGINE 100 UNIT/ML ~~LOC~~ SOLN
25.0000 [IU] | Freq: Every day | SUBCUTANEOUS | Status: DC
  Filled 2021-01-06: qty 0.25

## 2021-01-06 MED ORDER — PROMETHAZINE HCL 25 MG PO TABS: 12.5000 mg | ORAL_TABLET | ORAL | Status: DC | PRN

## 2021-01-06 MED ORDER — PROPOFOL 10 MG/ML IV BOLUS
INTRAVENOUS | Status: AC
  Filled 2021-01-06: qty 20

## 2021-01-06 MED ORDER — FENTANYL CITRATE (PF) 100 MCG/2ML IJ SOLN
25.0000 ug | INTRAMUSCULAR | Status: DC | PRN
  Administered 2021-01-06: 50 ug via INTRAVENOUS

## 2021-01-06 MED ORDER — MIDAZOLAM HCL 2 MG/2ML IJ SOLN
INTRAMUSCULAR | Status: DC | PRN
  Administered 2021-01-06: 2 mg via INTRAVENOUS

## 2021-01-06 MED ORDER — EPHEDRINE SULFATE-NACL 50-0.9 MG/10ML-% IV SOSY
PREFILLED_SYRINGE | INTRAVENOUS | Status: DC | PRN
  Administered 2021-01-06: 5 mg via INTRAVENOUS
  Administered 2021-01-06: 10 mg via INTRAVENOUS
  Administered 2021-01-06 (×2): 5 mg via INTRAVENOUS

## 2021-01-06 MED ORDER — ONDANSETRON HCL 4 MG/2ML IJ SOLN
4.0000 mg | Freq: Four times a day (QID) | INTRAMUSCULAR | Status: DC | PRN
  Administered 2021-01-06: 4 mg via INTRAVENOUS
  Filled 2021-01-06: qty 2

## 2021-01-06 MED ORDER — TOBRAMYCIN SULFATE 1.2 G IJ SOLR
INTRAMUSCULAR | Status: AC
  Filled 2021-01-06: qty 1.2

## 2021-01-06 MED ORDER — DEXTROSE 50 % IV SOLN
1.0000 | Freq: Once | INTRAVENOUS | Status: AC
  Administered 2021-01-06: 50 mL via INTRAVENOUS

## 2021-01-06 MED ORDER — VANCOMYCIN HCL 1000 MG IV SOLR
INTRAVENOUS | Status: AC
  Filled 2021-01-06: qty 1000

## 2021-01-06 MED ORDER — MORPHINE SULFATE (PF) 2 MG/ML IV SOLN
2.0000 mg | INTRAVENOUS | Status: DC | PRN
  Administered 2021-01-06 – 2021-01-09 (×7): 2 mg via INTRAVENOUS
  Filled 2021-01-06 (×9): qty 1

## 2021-01-06 MED ORDER — SODIUM CHLORIDE 0.9% FLUSH: 3.0000 mL | INTRAVENOUS | Status: DC | PRN

## 2021-01-06 MED ORDER — LIDOCAINE HCL (CARDIAC) PF 100 MG/5ML IV SOSY
PREFILLED_SYRINGE | INTRAVENOUS | Status: DC | PRN
  Administered 2021-01-06: 60 mg via INTRAVENOUS

## 2021-01-06 MED ORDER — VANCOMYCIN HCL 1 G IV SOLR
INTRAVENOUS | Status: DC | PRN
  Administered 2021-01-06: 1000 mg

## 2021-01-06 MED ORDER — DEXTROSE 50 % IV SOLN
INTRAVENOUS | Status: AC
  Administered 2021-01-06: 25 mL
  Filled 2021-01-06: qty 50

## 2021-01-06 MED ORDER — TOBRAMYCIN SULFATE 1.2 G IJ SOLR
INTRAMUSCULAR | Status: DC | PRN
Start: 2021-01-06 — End: 2021-01-06
  Administered 2021-01-06: 1.2 g

## 2021-01-06 MED ORDER — 0.9 % SODIUM CHLORIDE (POUR BTL) OPTIME
TOPICAL | Status: DC | PRN
  Administered 2021-01-06: 1000 mL

## 2021-01-06 MED ORDER — MIDAZOLAM HCL 2 MG/2ML IJ SOLN
INTRAMUSCULAR | Status: AC
  Filled 2021-01-06: qty 2

## 2021-01-06 MED ORDER — FENTANYL CITRATE (PF) 100 MCG/2ML IJ SOLN
INTRAMUSCULAR | Status: DC | PRN
  Administered 2021-01-06 (×2): 50 ug via INTRAVENOUS

## 2021-01-06 SURGICAL SUPPLY — 49 items
APL PRP STRL LF DISP 70% ISPRP (MISCELLANEOUS) ×1
BLADE SURG 15 STRL LF DISP TIS (BLADE) ×1 IMPLANT
BLADE SURG 15 STRL SS (BLADE) ×2
BNDG ELASTIC 3X5.8 VLCR STR LF (GAUZE/BANDAGES/DRESSINGS) ×2 IMPLANT
BNDG ELASTIC 4X5.8 VLCR STR LF (GAUZE/BANDAGES/DRESSINGS) ×2 IMPLANT
BNDG GAUZE ELAST 4 BULKY (GAUZE/BANDAGES/DRESSINGS) ×2 IMPLANT
CHLORAPREP W/TINT 26 (MISCELLANEOUS) ×2 IMPLANT
CNTNR URN SCR LID CUP LEK RST (MISCELLANEOUS) ×1 IMPLANT
CONT SPEC 4OZ STRL OR WHT (MISCELLANEOUS) ×2
COVER BACK TABLE 60X90IN (DRAPES) ×2 IMPLANT
COVER WAND RF STERILE (DRAPES) ×2 IMPLANT
CUFF TOURN SGL QUICK 18X4 (TOURNIQUET CUFF) ×2 IMPLANT
DRAPE 3/4 80X56 (DRAPES) ×2 IMPLANT
DRAPE EXTREMITY T 121X128X90 (DISPOSABLE) ×2 IMPLANT
DRAPE SHEET LG 3/4 BI-LAMINATE (DRAPES) ×2 IMPLANT
DRAPE U-SHAPE 47X51 STRL (DRAPES) ×2 IMPLANT
DRSG ADAPTIC 3X8 NADH LF (GAUZE/BANDAGES/DRESSINGS) ×2 IMPLANT
DRSG KUZMA FLUFF (GAUZE/BANDAGES/DRESSINGS) ×2 IMPLANT
DRSG PAD ABDOMINAL 8X10 ST (GAUZE/BANDAGES/DRESSINGS) ×2 IMPLANT
ELECT REM PT RETURN 15FT ADLT (MISCELLANEOUS) ×2 IMPLANT
GAUZE PACKING IODOFORM 1/4X15 (PACKING) ×2 IMPLANT
GAUZE SPONGE 4X4 12PLY STRL (GAUZE/BANDAGES/DRESSINGS) ×2 IMPLANT
GLOVE SRG 8 PF TXTR STRL LF DI (GLOVE) ×1 IMPLANT
GLOVE SURG ENC MOIS LTX SZ7.5 (GLOVE) ×2 IMPLANT
GLOVE SURG UNDER POLY LF SZ8 (GLOVE) ×2
GOWN STRL REUS W/ TWL XL LVL3 (GOWN DISPOSABLE) ×1 IMPLANT
GOWN STRL REUS W/TWL XL LVL3 (GOWN DISPOSABLE) ×2
KIT BASIN OR (CUSTOM PROCEDURE TRAY) ×2 IMPLANT
KIT STIMULAN RAPID CURE  10CC (Orthopedic Implant) ×2 IMPLANT
KIT STIMULAN RAPID CURE 10CC (Orthopedic Implant) ×1 IMPLANT
MANIFOLD NEPTUNE II (INSTRUMENTS) ×2 IMPLANT
NEEDLE HYPO 25X1 1.5 SAFETY (NEEDLE) ×2 IMPLANT
PADDING CAST ABS 4INX4YD NS (CAST SUPPLIES) ×1
PADDING CAST ABS COTTON 4X4 ST (CAST SUPPLIES) ×1 IMPLANT
PENCIL SMOKE EVAC W/HOLSTER (ELECTROSURGICAL) ×2 IMPLANT
PROBE DEBRIDE SONICVAC MISONIX (TIP) ×2 IMPLANT
SPONGE LAP 4X18 RFD (DISPOSABLE) ×2 IMPLANT
STAPLER VISISTAT 35W (STAPLE) ×2 IMPLANT
STOCKINETTE 6  STRL (DRAPES) ×2
STOCKINETTE 6 STRL (DRAPES) ×1 IMPLANT
SUT MNCRL AB 4-0 PS2 18 (SUTURE) ×2 IMPLANT
SWAB COLLECTION DEVICE MRSA (MISCELLANEOUS) ×2 IMPLANT
SWAB CULTURE ESWAB REG 1ML (MISCELLANEOUS) ×2 IMPLANT
SYR BULB EAR ULCER 3OZ GRN STR (SYRINGE) ×2 IMPLANT
SYR CONTROL 10ML LL (SYRINGE) ×2 IMPLANT
TRAY PREP A LATEX SAFE STRL (SET/KITS/TRAYS/PACK) IMPLANT
TUBE IRRIGATION SET MISONIX (TUBING) ×2 IMPLANT
UNDERPAD 30X36 HEAVY ABSORB (UNDERPADS AND DIAPERS) ×2 IMPLANT
YANKAUER SUCT BULB TIP NO VENT (SUCTIONS) ×2 IMPLANT

## 2021-01-06 NOTE — Anesthesia Postprocedure Evaluation (Signed)
Anesthesia Post Note  Patient: Fernando Anthony  Procedure(s) Performed: INCISION AND DRAINAGE, ANTIBIOTIC BEAD PLACEMENT (Right Foot) BONE BIOPSY (Right Foot)     Patient location during evaluation: PACU Anesthesia Type: General Level of consciousness: awake and alert Pain management: pain level controlled Vital Signs Assessment: post-procedure vital signs reviewed and stable Respiratory status: spontaneous breathing, nonlabored ventilation, respiratory function stable and patient connected to nasal cannula oxygen Cardiovascular status: blood pressure returned to baseline and stable Postop Assessment: no apparent nausea or vomiting Anesthetic complications: no   No complications documented.  Last Vitals:  Vitals:   01/06/21 2130 01/06/21 2145  BP: 138/74 (!) 148/80  Pulse: 61 61  Resp: 14 15  Temp:    SpO2: 96% 91%    Last Pain:  Vitals:   01/06/21 2145  TempSrc:   PainSc: 3                  Tiajuana Amass

## 2021-01-06 NOTE — Anesthesia Preprocedure Evaluation (Signed)
Anesthesia Evaluation  Patient identified by MRN, date of birth, ID band Patient awake    Reviewed: Allergy & Precautions, NPO status , Patient's Chart, lab work & pertinent test results  Airway Mallampati: II  TM Distance: >3 FB     Dental  (+) Dental Advisory Given   Pulmonary Current Smoker,    breath sounds clear to auscultation       Cardiovascular hypertension, Pt. on medications and Pt. on home beta blockers  Rhythm:Regular Rate:Normal     Neuro/Psych  Neuromuscular disease    GI/Hepatic negative GI ROS,   Endo/Other  diabetes, Poorly Controlled, Type 2, Insulin Dependent  Renal/GU CRFRenal disease     Musculoskeletal   Abdominal   Peds  Hematology  (+) anemia ,   Anesthesia Other Findings   Reproductive/Obstetrics                             Anesthesia Physical Anesthesia Plan  ASA: III  Anesthesia Plan: General   Post-op Pain Management:    Induction: Intravenous  PONV Risk Score and Plan: 1 and Ondansetron, Midazolam, Dexamethasone and Treatment may vary due to age or medical condition  Airway Management Planned: LMA  Additional Equipment:   Intra-op Plan:   Post-operative Plan: Extubation in OR  Informed Consent: I have reviewed the patients History and Physical, chart, labs and discussed the procedure including the risks, benefits and alternatives for the proposed anesthesia with the patient or authorized representative who has indicated his/her understanding and acceptance.       Plan Discussed with: CRNA  Anesthesia Plan Comments:         Anesthesia Quick Evaluation

## 2021-01-06 NOTE — Progress Notes (Signed)
Responded to consult for IV. Pt stated IV pump was "beeping every 5 minutes" but the nurse repositioned the tubing. States there have been no other issues. No signs of infiltration. Pt denies pain. Consult cleared and pt instructed to call for primary nurse for any other needs.

## 2021-01-06 NOTE — Progress Notes (Signed)
PROGRESS NOTE  Fernando Anthony [id number] DOB: Aug 08, 1956   PCP: Curly Rim, MD  Patient is from: Home.  DOA: 01/04/2021 LOS: 2  Chief complaints: Right foot ulcer  Brief Narrative / Interim history: 65 year old male with PMH of IDDM-2, right foot osteo s/p TMT in 2020, HLD and HTN sent to ED by podiatry, Dr. Amalia Hailey for right heel abscess and drainage.  MRI right foot concerning for calcaneal early osteomyelitis.  Inflammatory markers elevated.  On IV daptomycin and Rocephin.  Subjective: Seen and examined earlier this morning.  No major events overnight of this morning.  Reports significant pain in right foot but improved with pain medication.  Denies chest pain or dyspnea.  Denies GI or UTI symptoms other than decreased urine output.  Objective: Vitals:   01/05/21 1429 01/05/21 2114 01/06/21 1230 01/06/21 1348  BP: (!) 116/58 124/68  (!) 153/87  Pulse: 65 65  68  Resp: '17 18  16  '$ Temp: 98.7 F (37.1 C) 99.7 F (37.6 C) 99.6 F (37.6 C) 99.5 F (37.5 C)  TempSrc: Oral Oral Oral Oral  SpO2: 95% 91%  93%  Weight:      Height:        Intake/Output Summary (Last 24 hours) at 01/06/2021 1444 Last data filed at 01/06/2021 1022 Gross per 24 hour  Intake 3955.25 ml  Output 1490 ml  Net 2465.25 ml   Filed Weights   01/04/21 1741 01/04/21 2244  Weight: 97.5 kg 101.9 kg    Examination:  GENERAL: No apparent distress.  Nontoxic. HEENT: MMM.  Vision and hearing grossly intact.  NECK: Supple.  No apparent JVD.  RESP: On RA.  No IWOB.  Fair aeration bilaterally. CVS:  RRR. Heart sounds normal.  ABD/GI/GU: BS+. Abd soft, NTND.  MSK/EXT:  Moves extremities. S/p right TMT amputation. SKIN: Plantar ulcer at right heel and distally.  NEURO: Awake, alert and oriented appropriately.  No apparent focal neuro deficit. PSYCH: Calm. Normal affect.  Procedures:  None  Microbiology summarized: [id number] and influenza PCR nonreactive. MRSA PCR screen nonreactive. Superficial  wound culture with abundant GPC, GNR and GPR. Blood cultures NGTD.  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 Sed rate 129, CRP 19.3. Cultures as above.  Leukocytosis improving. -Continue daptomycin and Rocephin  AKI/azotemia: Likely prerenal.  He is also on ACE inhibitor's and NSAIDs which could contribute. FeNa 0.4%, consistent with prerenal injury.  Renal US with medical renal disease, bladder wall thickening Recent Labs    01/04/21 1604 01/05/21 0338 01/06/21 0330  BUN 56* 56* 66*  CREATININE 3.94* 4.05* 4.52*  -Continue IV fluids -Avoid nephrotoxic meds.  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 Recent Labs  Lab 01/05/21 1220 01/05/21 1713 01/05/21 2112 01/06/21 0747 01/06/21 1154  GLUCAP 167* 185* 198* 68* 101*  -Decrease Lantus to 25 units at bedtime -Continue SSI moderate  Essential HTN: Normotensive for most part. -Continue amlodipine, atenolol, clonidine, hydralazine -Hold Lasix, HCTZ, lisinopril due to AKI  Elevated liver enzymes: Pattern consistent with EtOH use rhabdo but CK within normal.  Daptomycin might contribute. -Hold Crestor. -Continue trending -acute hepatitis panel and RUQ ultrasound if no improvement  Hyponatremia: Likely due to AKI.  Improved. -Continue monitoring  HLD -Hold Crestor given elevated LFT  Hypokalemia: Resolved.  Iron deficiency anemia: H&H stable. -Hold off on supplementation IV with acute infectious process  Body mass index is 28.84 kg/m.         DVT prophylaxis:  SCDs  Start: 01/04/21 1945  Code Status: Full code Family Communication: Updated patient's wife at bedside. Level of care: Med-Surg Status is: Inpatient  Remains inpatient appropriate because:Ongoing active pain requiring inpatient pain management, Ongoing diagnostic testing needed not appropriate for outpatient work up, IV treatments appropriate due to intensity of  illness or inability to take PO and Inpatient level of care appropriate due to severity of illness   Dispo: The patient is from: Home              Anticipated d/c is to: Home              Anticipated d/c date is: > 3 days              Patient currently is not medically stable to d/c.   Difficult to place patient No       Consultants:  Podiatry   Sch Meds:  Scheduled Meds: . amLODipine  10 mg Oral Daily  . aspirin EC  81 mg Oral QHS  . atenolol  50 mg Oral Daily  . cloNIDine  0.1 mg Oral BID  . hydrALAZINE  12.5 mg Oral BID  . insulin aspart  0-15 Units Subcutaneous TID WC  . insulin aspart  0-5 Units Subcutaneous QHS  . insulin glargine  30 Units Subcutaneous QHS  . omega-3 acid ethyl esters  1 g Oral Daily  . pregabalin  50 mg Oral TID  . rosuvastatin  20 mg Oral Daily   Continuous Infusions: . sodium chloride 150 mL/hr at 01/06/21 1349  . cefTRIAXone (ROCEPHIN)  IV 2 g (01/05/21 2208)  . [START ON 01/07/2021] DAPTOmycin (CUBICIN)  IV     PRN Meds:.acetaminophen, fentaNYL (SUBLIMAZE) injection, ondansetron (ZOFRAN) IV, oxyCODONE, polyethylene glycol, senna-docusate  Antimicrobials: Anti-infectives (From admission, onward)   Start     Dose/Rate Route Frequency Ordered Stop   01/07/21 2000  DAPTOmycin (CUBICIN) 800 mg in sodium chloride 0.9 % IVPB        800 mg 232 mL/hr over 30 Minutes Intravenous Every 48 hours 01/05/21 0809     01/05/21 1000  DAPTOmycin (CUBICIN) 800 mg in sodium chloride 0.9 % IVPB        800 mg 232 mL/hr over 30 Minutes Intravenous  Once 01/05/21 0809 01/05/21 1139   01/05/21 0900  vancomycin (VANCOREADY) IVPB 1250 mg/250 mL  Status:  Discontinued        1,250 mg 166.7 mL/hr over 90 Minutes Intravenous  Once 01/05/21 0741 01/05/21 0748   01/05/21 0741  vancomycin variable dose per unstable renal function (pharmacist dosing)  Status:  Discontinued         Does not apply See admin instructions 01/05/21 0741 01/05/21 0748   01/04/21 2000   cefTRIAXone (ROCEPHIN) 2 g in sodium chloride 0.9 % 100 mL IVPB        2 g 200 mL/hr over 30 Minutes Intravenous Every 24 hours 01/04/21 1944     01/04/21 1700  vancomycin (VANCOCIN) IVPB 1000 mg/200 mL premix        1,000 mg 200 mL/hr over 60 Minutes Intravenous  Once 01/04/21 1650 01/04/21 1900       I have personally reviewed the following labs and images: CBC: Recent Labs  Lab 01/04/21 1604 01/05/21 0338 01/06/21 0330  WBC 25.3* 15.3* 12.9*  NEUTROABS 23.3*  --   --   HGB 9.9* 8.1* 9.5*  HCT 30.6* 24.7* 28.9*  MCV 96.2 93.9 93.8  PLT 240 196 224   BMP &GFR  Recent Labs  Lab 01/04/21 1604 01/05/21 0338 01/05/21 0355 01/06/21 0330  NA 129* 128*  --  131*  K 3.4* 3.2*  --  3.5  CL 91* 94*  --  99  CO2 24 23  --  22  GLUCOSE 239* 127*  --  133*  BUN 56* 56*  --  66*  CREATININE 3.94* 4.05*  --  4.52*  CALCIUM 7.9* 7.2*  --  7.4*  MG  --   --  1.9  --    Estimated Creatinine Clearance: 21.3 mL/min (A) (by C-G formula based on SCr of 4.52 mg/dL (H)). Liver & Pancreas: Recent Labs  Lab 01/04/21 1604 01/05/21 0355 01/06/21 0330  AST 218* 149* 243*  ALT 116* 92* 133*  ALKPHOS 409* 370* 612*  BILITOT 2.1* 1.6* 2.8*  PROT 6.4* 5.3* 6.0*  ALBUMIN 2.3* 1.8* 2.0*   No results for input(s): LIPASE, AMYLASE in the last 168 hours. No results for input(s): AMMONIA in the last 168 hours. Diabetic: Recent Labs    01/04/21 2253  HGBA1C 10.5*   Recent Labs  Lab 01/05/21 1220 01/05/21 1713 01/05/21 2112 01/06/21 0747 01/06/21 1154  GLUCAP 167* 185* 198* 68* 101*   Cardiac Enzymes: Recent Labs  Lab 01/06/21 0330  CKTOTAL 65   No results for input(s): PROBNP in the last 8760 hours. Coagulation Profile: Recent Labs  Lab 01/04/21 1649  INR 1.2   Thyroid Function Tests: No results for input(s): TSH, T4TOTAL, FREET4, T3FREE, THYROIDAB in the last 72 hours. Lipid Profile: No results for input(s): CHOL, HDL, LDLCALC, TRIG, CHOLHDL, LDLDIRECT in the last 72  hours. Anemia Panel: Recent Labs    01/05/21 0338  VITAMINB12 1,664*  FOLATE 22.9  TIBC 198*  IRON 15*   Urine analysis:    Component Value Date/Time   COLORURINE AMBER (A) 01/04/2021 1826   APPEARANCEUR CLOUDY (A) 01/04/2021 1826   LABSPEC 1.015 01/04/2021 1826   PHURINE 5.0 01/04/2021 1826   GLUCOSEU 50 (A) 01/04/2021 1826   HGBUR MODERATE (A) 01/04/2021 1826   BILIRUBINUR NEGATIVE 01/04/2021 1826   KETONESUR NEGATIVE 01/04/2021 1826   PROTEINUR >=300 (A) 01/04/2021 1826   NITRITE NEGATIVE 01/04/2021 1826   LEUKOCYTESUR NEGATIVE 01/04/2021 1826   Sepsis Labs: Invalid input(s): PROCALCITONIN, [place]  Microbiology: Recent Results (from the past 240 hour(s))  WOUND CULTURE     Status: None (Preliminary result)   Collection Time: 01/04/21 12:13 PM  Result Value Ref Range Status   MICRO NUMBER: [id number]  Preliminary   SPECIMEN QUALITY: Adequate  Preliminary   SOURCE: WOUND (SITE NOT SPECIFIED)  Preliminary   STATUS: PRELIMINARY  Preliminary   GRAM STAIN:   Preliminary    Rare Polymorphonuclear leukocytes Few epithelial cells Many Gram positive cocci in pairs Many Gram positive cocci in clusters Many Gram negative bacilli   RESULT: Culture in progress  Preliminary  Aerobic Culture w Gram Stain (superficial specimen)     Status: None (Preliminary result)   Collection Time: 01/04/21  5:19 PM   Specimen: Foot; Wound  Result Value Ref Range Status   Specimen Description   Final    FOOT RIGHT Performed at [facility] [address] [place], [place] [address]    Special Requests   Final    NONE Performed at [facility], [place] [address], [place], [place] [address]    Gram Stain   Final    MODERATE WBC PRESENT, PREDOMINANTLY PMN ABUNDANT GRAM POSITIVE COCCI ABUNDANT GRAM NEGATIVE RODS  MODERATE GRAM POSITIVE RODS    Culture   Final    FEW CITROBACTER KOSERI SUSCEPTIBILITIES TO FOLLOW Performed at [facility],  [place] [address] [place], [place] [address]    Report Status PENDING  Incomplete  Culture, blood (routine x 2)     Status: None (Preliminary result)   Collection Time: 01/04/21  5:23 PM   Specimen: BLOOD  Result Value Ref Range Status   Specimen Description   Final    BLOOD LEFT ANTECUBITAL Performed at [place] [address], [place], [place] [address]    Special Requests   Final    BOTTLES DRAWN AEROBIC AND ANAEROBIC Blood Culture results may not be optimal due to an inadequate volume of blood received in culture bottles Performed at [facility] [address] [place], [place] [address]    Culture   Final    NO GROWTH 2 DAYS Performed at [facility] [address], [place], [place] [address]    Report Status PENDING  Incomplete  Culture, blood (routine x 2)     Status: None (Preliminary result)   Collection Time: 01/04/21  6:04 PM   Specimen: BLOOD  Result Value Ref Range Status   Specimen Description   Final    BLOOD RIGHT ANTECUBITAL Performed at [facility] [address] [place], [place] [address]    Special Requests   Final    BOTTLES DRAWN AEROBIC AND ANAEROBIC Blood Culture adequate volume Performed at [facility] [address], [place], [place] [address]    Culture   Final    NO GROWTH 2 DAYS Performed at [facility] [address] [place], [place] [address]    Report Status PENDING  Incomplete  Resp Panel by RT-PCR (Flu A&B, Covid) Nasopharyngeal Swab     Status: None   Collection Time: 01/04/21  6:05 PM   Specimen: Nasopharyngeal Swab; Nasopharyngeal(NP) swabs in vial transport medium  Result Value Ref Range Status   SARS Coronavirus 2 by RT PCR NEGATIVE NEGATIVE Final    Comment: (NOTE) SARS-CoV-2 target nucleic acids are NOT DETECTED.  The SARS-CoV-2 RNA is generally detectable in upper respiratory specimens during the acute phase of infection. The  lowest concentration of SARS-CoV-2 viral copies this assay can detect is 138 copies/mL. A negative result does not preclude SARS-Cov-2 infection and should not be used as the sole basis for treatment or other patient management decisions. A negative result may occur with  improper specimen collection/handling, submission of specimen other than nasopharyngeal swab, presence of viral mutation(s) within the areas targeted by this assay, and inadequate number of viral copies(<138 copies/mL). A negative result must be combined with clinical observations, patient history, and epidemiological information. The expected result is Negative.  Fact Sheet for Patients:  EntrepreneurPulse.com.au  Fact Sheet for Healthcare Providers:  IncredibleEmployment.be  This test is no t yet approved or cleared by the [place] FDA and  has been authorized for detection and/or diagnosis of SARS-CoV-2 by FDA under an Emergency Use Authorization (EUA). This EUA will remain  in effect (meaning this test can be used) for the duration of the COVID-19 declaration under Section 564(b)(1) of the Act, 21 U.S.C.section 360bbb-3(b)(1), unless the authorization is terminated  or revoked sooner.       Influenza A by PCR NEGATIVE NEGATIVE Final   Influenza B by PCR NEGATIVE NEGATIVE Final    Comment: (NOTE) The Xpert Xpress SARS-CoV-2/FLU/RSV  plus assay is intended as an aid in the diagnosis of influenza from Nasopharyngeal swab specimens and should not be used as a sole basis for treatment. Nasal washings and aspirates are unacceptable for Xpert Xpress SARS-CoV-2/FLU/RSV testing.  Fact Sheet for Patients: EntrepreneurPulse.com.au  Fact Sheet for Healthcare Providers: IncredibleEmployment.be  This test is not yet approved or cleared by the [place] FDA and has been authorized for detection and/or diagnosis of SARS-CoV-2 by FDA under  an Emergency Use Authorization (EUA). This EUA will remain in effect (meaning this test can be used) for the duration of the COVID-19 declaration under Section 564(b)(1) of the Act, 21 U.S.C. section 360bbb-3(b)(1), unless the authorization is terminated or revoked.  Performed at [facility], [place] [address] [place], [place] [address]   Surgical PCR screen     Status: None   Collection Time: 01/04/21 10:41 PM   Specimen: Nasal Mucosa; Nasal Swab  Result Value Ref Range Status   MRSA, PCR NEGATIVE NEGATIVE Final   Staphylococcus aureus NEGATIVE NEGATIVE Final    Comment: (NOTE) The Xpert SA Assay (FDA approved for NASAL specimens in patients 60 years of age and older), is one component of a comprehensive surveillance program. It is not intended to diagnose infection nor to guide or monitor treatment. Performed at [facility], [place] [address], [place], [place] [address]     Radiology Studies: No results found.    Curley Hogen T. Toxey  If 7PM-7AM, please contact night-coverage [email or web address] 01/06/2021, 2:44 PM

## 2021-01-06 NOTE — Progress Notes (Addendum)
Inpatient Diabetes Program Recommendations  AACE/ADA: New Consensus Statement on Inpatient Glycemic Control (2015)  Target Ranges:  Prepandial:   less than 140 mg/dL      Peak postprandial:   less than 180 mg/dL (1-2 hours)      Critically ill patients:  140 - 180 mg/dL   Lab Results  Component Value Date   GLUCAP 68 (L) 01/06/2021   HGBA1C 10.5 (H) 01/04/2021    Review of Glycemic Control Results for Fernando Anthony, Fernando Anthony (MRN [id number]) as of 01/06/2021 11:24  Ref. Range 01/05/2021 07:19 01/05/2021 12:20 01/05/2021 17:13 01/05/2021 21:12 01/06/2021 07:47  Glucose-Capillary Latest Ref Range: 70 - 99 mg/dL 103 (H) 167 (H) 185 (H) 198 (H) 68 (L)   Diabetes history:  DM2 Outpatient Diabetes medications:  Toujeo 40 units QHS Fiasp 13 units TID Freestyle Libre CGM Current orders for Inpatient glycemic control:  Lantus 30 units QHS Novolog 0-15 units TID & 0-5 units QHS  Inpatient Diabetes Program Recommendations:    Please consider Lantus 20 units QHS.  Addendum @ 41: Spoke with patient and wife at bedside.  Reviewed patient's current A1c of 10.5%. Explained what a A1c is and what it measures. Also reviewed goal A1c with patient, importance of good glucose control @ home, and blood sugar goals.  He states that is actually better than what it usually runs.  He confirms above home medications.  He uses the [employer] CGM.  Denies difficulties obtaining medications or supplies.  He has recently focused more on portion sizes when eating and feels this has helped lower his blood sugar.  He is current with PCP.    Will continue to follow while inpatient.  Thank you, Reche Dixon, RN, BSN Diabetes Coordinator Inpatient Diabetes Program [phone] (team pager from 8a-5p)    Will continue to follow while inpatient.  Thank you, Reche Dixon, RN, BSN Diabetes Coordinator Inpatient Diabetes Program [phone] (team pager from 8a-5p)

## 2021-01-06 NOTE — Interval H&P Note (Deleted)
History and Physical Interval Note:  01/06/2021 7:43 PM  Fernando Anthony  has presented today for surgery, with the diagnosis of OSTEOMYLITIS, LEFT FOOT.  The various methods of treatment have been discussed with the patient and family. After consideration of risks, benefits and other options for treatment, the patient has consented to  Procedure(s): INCISION AND DRAINAGE, ANTIBIOTIC BEAD PLACEMENT (Right) BONE BIOPSY (Right) as a surgical intervention.  The patient's history has been reviewed, patient examined, no change in status, stable for surgery.  I have reviewed the patient's chart and labs.  Questions were answered to the patient's satisfaction.     Edrick Kins

## 2021-01-06 NOTE — TOC Initial Note (Signed)
Transition of Care [facility]) - Initial/Assessment Note    Patient Details  Name: Fernando Anthony MRN: [id number] Date of Birth: 04-14-1956  Transition of Care [facility]) CM/SW Contact:    Lia Hopping, [place] Phone Number: 01/06/2021, 3:07 PM  Clinical Narrative:  Patient admitted for right heel abscess and drainage. Patient medical work up ongoing. CSW met with the patient and his spouse at bedside to introduce role and that TOC will follow for discharge needs. Patient receptive the to visit.      Barriers to Discharge: Continued Medical Work up   Patient Goals and CMS Choice Patient states their goals for this hospitalization and ongoing recovery are:: to get treatment      Expected Discharge Plan and Services   In-house Referral: Clinical Social Work Discharge Planning Services: CM Consult   Living arrangements for the past 2 months: Single Family Home                                      Prior Living Arrangements/Services Living arrangements for the past 2 months: Single Family Home Lives with:: Spouse Patient language and need for interpreter reviewed:: No Do you feel safe going back to the place where you live?: Yes      Need for Family Participation in Patient Care: Yes (Comment) Care giver support system in place?: Yes (comment) Current home services: DME Criminal Activity/Legal Involvement Pertinent to Current Situation/Hospitalization: No - Comment as needed  Activities of Daily Living Home Assistive Devices/Equipment: Eyeglasses,Walker (specify type),Cane (specify quad or straight),Shower chair with back (reading glasses, scooter) ADL Screening (condition at time of admission) Patient's cognitive ability adequate to safely complete daily activities?: Yes Is the patient deaf or have difficulty hearing?: No Does the patient have difficulty seeing, even when wearing glasses/contacts?: No Does the patient have difficulty concentrating, remembering, or making  decisions?: Yes (trouble remembering) Patient able to express need for assistance with ADLs?: Yes Does the patient have difficulty dressing or bathing?: No Independently performs ADLs?: Yes (appropriate for developmental age) Does the patient have difficulty walking or climbing stairs?: Yes Weakness of Legs: Right Weakness of Arms/Hands: None  Permission Sought/Granted Permission sought to share information with : Family Nature conservation officer Permission granted to share information with : Yes, Verbal Permission Granted              Emotional Assessment Appearance:: Appears stated age Attitude/Demeanor/Rapport: Engaged Affect (typically observed): Accepting Orientation: : Oriented to Self,Oriented to Place,Oriented to  Time,Oriented to Situation Alcohol / Substance Use: Not Applicable Psych Involvement: No (comment)  Admission diagnosis:  Osteomyelitis ([place]) [M86.9] Pulmonary vascular congestion [R09.89] Osteomyelitis of right foot, unspecified type ([place]) [M86.9] Sepsis with acute renal failure without septic shock, due to unspecified organism, unspecified acute renal failure type ([place]) [A41.9, R65.20, N17.9] Patient Active Problem List   Diagnosis Date Noted  . AKI (acute kidney injury) ([place]) 01/04/2021  . Anemia 01/04/2021  . History of transmetatarsal amputation of right foot ([place]) 06/11/2019  . Neuropathic pain of foot, right 06/11/2019  . Memory difficulties 01/18/2019  . Psychophysiological insomnia 01/18/2019  . Hyperlipidemia associated with type 2 diabetes mellitus ([place]) 12/13/2018  . Hypertension associated with type 2 diabetes mellitus ([place]) 12/13/2018  . Partial nontraumatic amputation of right foot ([place]) 12/13/2018  . Osteomyelitis of right foot ([place]) 12/04/2018  . Hypertensive urgency 12/04/2018  . Abnormal LFTs 12/04/2018  . Hyponatremia 12/04/2018  . Osteomyelitis ([place])  12/04/2018  . Hypertension, essential, benign 08/20/2017  . Hypokalemia 08/20/2017  .  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 Lumbar post-laminectomy syndrome 09/16/2014  . Diabetic neuropathy ([place]) 04/04/2013  . Postlaminectomy syndrome, lumbar region 05/18/2012  . Uncontrolled diabetes mellitus with diabetic nephropathy ([place]) 05/18/2012   PCP:  Curly Rim, MD Pharmacy:   [id number] [place] [place]Phone: [phone]: [phone]    Social Determinants of Health (SDOH) Interventions    Readmission Risk Interventions No flowsheet data found.

## 2021-01-06 NOTE — Anesthesia Procedure Notes (Signed)
Procedure Name: LMA Insertion Date/Time: 01/06/2021 7:55 PM Performed by: Raenette Rover, CRNA Pre-anesthesia Checklist: Patient identified, Emergency Drugs available, Suction available and Patient being monitored Patient Re-evaluated:Patient Re-evaluated prior to induction Oxygen Delivery Method: Circle system utilized Preoxygenation: Pre-oxygenation with 100% oxygen Induction Type: IV induction LMA: LMA with gastric port inserted LMA Size: 4.0 Number of attempts: 1 Placement Confirmation: positive ETCO2 and breath sounds checked- equal and bilateral Tube secured with: Tape Dental Injury: Teeth and Oropharynx as per pre-operative assessment

## 2021-01-06 NOTE — Interval H&P Note (Signed)
History and Physical Interval Note:  01/06/2021 9:04 PM  [name]  has presented today for surgery, with the diagnosis of OSTEOMYLITIS, RIGHT FOOT.  The various methods of treatment have been discussed with the patient and family. After consideration of risks, benefits and other options for treatment, the patient has consented to  Procedure(s): INCISION AND DRAINAGE, ANTIBIOTIC BEAD PLACEMENT (Right) BONE BIOPSY (Right) as a surgical intervention.  The patient's history has been reviewed, patient examined, no change in status, stable for surgery.  I have reviewed the patient's chart and labs.  Questions were answered to the patient's satisfaction.     Fernando Anthony

## 2021-01-06 NOTE — Brief Op Note (Signed)
01/06/2021  9:01 PM  PATIENT:  Fernando Anthony  65 y.o. male  PRE-OPERATIVE DIAGNOSIS:  OSTEOMYLITIS, RIGHT FOOT  POST-OPERATIVE DIAGNOSIS:  OSTEOMYLITIS, RIGHT FOOT  PROCEDURE:  Procedure(s): INCISION AND DRAINAGE, ANTIBIOTIC BEAD PLACEMENT (Right) BONE BIOPSY (Right)  SURGEON:  Surgeon(s) and Role:    Edrick Kins, DPM - Primary  PHYSICIAN ASSISTANT:   ASSISTANTS: none   ANESTHESIA:   local and general  EBL:  10 mL   BLOOD ADMINISTERED:none  DRAINS: none   LOCAL MEDICATIONS USED:  MARCAINE     SPECIMEN:  Source of Specimen:  bone biopsy RT calcaneus  DISPOSITION OF SPECIMEN:  PATHOLOGY  COUNTS:  YES  TOURNIQUET:   Total Tourniquet Time Documented: Calf (Right) - 43 minutes Total: Calf (Right) - 43 minutes   DICTATION: .Viviann Spare Dictation  PLAN OF CARE: Admit to inpatient   PATIENT DISPOSITION:  PACU - hemodynamically stable.   Delay start of Pharmacological VTE agent (>24hrs) due to surgical blood loss or risk of bleeding: not applicable

## 2021-01-06 NOTE — Transfer of Care (Signed)
Immediate Anesthesia Transfer of Care Note  Patient: Fernando Anthony  Procedure(s) Performed: INCISION AND DRAINAGE, ANTIBIOTIC BEAD PLACEMENT (Right Foot) BONE BIOPSY (Right Foot)  Patient Location: PACU  Anesthesia Type:General  Level of Consciousness: drowsy  Airway & Oxygen Therapy: Patient Spontanous Breathing and Patient connected to face mask oxygen  Post-op Assessment: Report given to RN and Post -op Vital signs reviewed and stable  Post vital signs: Reviewed and stable  Last Vitals:  Vitals Value Taken Time  BP 113/62 01/06/21 2106  Temp    Pulse 60 01/06/21 2107  Resp 12 01/06/21 2107  SpO2 92 % 01/06/21 2107  Vitals shown include unvalidated device data.  Last Pain:  Vitals:   01/06/21 1813  TempSrc:   PainSc: 5       Patients Stated Pain Goal: 2 ([id number])  Complications: No complications documented.

## 2021-01-07 ENCOUNTER — Inpatient Hospital Stay (HOSPITAL_COMMUNITY): Payer: BC Managed Care – PPO

## 2021-01-07 ENCOUNTER — Encounter (HOSPITAL_COMMUNITY): Payer: Self-pay | Admitting: Podiatry

## 2021-01-07 DIAGNOSIS — E871 Hypo-osmolality and hyponatremia: Secondary | ICD-10-CM | POA: Diagnosis not present

## 2021-01-07 DIAGNOSIS — N179 Acute kidney failure, unspecified: Secondary | ICD-10-CM | POA: Diagnosis not present

## 2021-01-07 DIAGNOSIS — R945 Abnormal results of liver function studies: Secondary | ICD-10-CM | POA: Diagnosis not present

## 2021-01-07 DIAGNOSIS — M86171 Other acute osteomyelitis, right ankle and foot: Secondary | ICD-10-CM | POA: Diagnosis not present

## 2021-01-07 LAB — COMPREHENSIVE METABOLIC PANEL
ALT: 143 U/L — ABNORMAL HIGH (ref 0–44)
AST: 212 U/L — ABNORMAL HIGH (ref 15–41)
Albumin: 1.7 g/dL — ABNORMAL LOW (ref 3.5–5.0)
Alkaline Phosphatase: 682 U/L — ABNORMAL HIGH (ref 38–126)
Anion gap: 12 (ref 5–15)
BUN: 65 mg/dL — ABNORMAL HIGH (ref 8–23)
CO2: 20 mmol/L — ABNORMAL LOW (ref 22–32)
Calcium: 7.5 mg/dL — ABNORMAL LOW (ref 8.9–10.3)
Chloride: 101 mmol/L (ref 98–111)
Creatinine, Ser: 5.27 mg/dL — ABNORMAL HIGH (ref 0.61–1.24)
GFR, Estimated: 12 mL/min — ABNORMAL LOW (ref >60)
Glucose, Bld: 229 mg/dL — ABNORMAL HIGH (ref 70–99)
Potassium: 3.6 mmol/L (ref 3.5–5.1)
Sodium: 133 mmol/L — ABNORMAL LOW (ref 135–145)
Total Bilirubin: 3.2 mg/dL — ABNORMAL HIGH (ref 0.3–1.2)
Total Protein: 5.8 g/dL — ABNORMAL LOW (ref 6.5–8.1)

## 2021-01-07 LAB — CBC WITH DIFFERENTIAL/PLATELET
Abs Immature Granulocytes: 0.21 10*3/uL — ABNORMAL HIGH (ref 0.00–0.07)
Basophils Absolute: 0 10*3/uL (ref 0.0–0.1)
Basophils Relative: 0 %
Eosinophils Absolute: 0 10*3/uL (ref 0.0–0.5)
Eosinophils Relative: 0 %
HCT: 28.2 % — ABNORMAL LOW (ref 39.0–52.0)
Hemoglobin: 9.4 g/dL — ABNORMAL LOW (ref 13.0–17.0)
Immature Granulocytes: 2 %
Lymphocytes Relative: 3 %
Lymphs Abs: 0.4 10*3/uL — ABNORMAL LOW (ref 0.7–4.0)
MCH: 30.7 pg (ref 26.0–34.0)
MCHC: 33.3 g/dL (ref 30.0–36.0)
MCV: 92.2 fL (ref 80.0–100.0)
Monocytes Absolute: 0.6 10*3/uL (ref 0.1–1.0)
Monocytes Relative: 5 %
Neutro Abs: 10.9 10*3/uL — ABNORMAL HIGH (ref 1.7–7.7)
Neutrophils Relative %: 90 %
Platelets: 202 10*3/uL (ref 150–400)
RBC: 3.06 MIL/uL — ABNORMAL LOW (ref 4.22–5.81)
RDW: 13.6 % (ref 11.5–15.5)
WBC: 12.1 10*3/uL — ABNORMAL HIGH (ref 4.0–10.5)
nRBC: 0 % (ref 0.0–0.2)

## 2021-01-07 LAB — URINALYSIS, ROUTINE W REFLEX MICROSCOPIC
Bilirubin Urine: NEGATIVE
Glucose, UA: >=500 mg/dL — AB
Ketones, ur: 5 mg/dL — AB
Leukocytes,Ua: NEGATIVE
Nitrite: NEGATIVE
Protein, ur: 100 mg/dL — AB
Specific Gravity, Urine: 1.009 (ref 1.005–1.030)
pH: 6 (ref 5.0–8.0)

## 2021-01-07 LAB — CREATININE, URINE, RANDOM: Creatinine, Urine: 46.43 mg/dL

## 2021-01-07 LAB — GLUCOSE, CAPILLARY
Glucose-Capillary: 245 mg/dL — ABNORMAL HIGH (ref 70–99)
Glucose-Capillary: 337 mg/dL — ABNORMAL HIGH (ref 70–99)
Glucose-Capillary: 362 mg/dL — ABNORMAL HIGH (ref 70–99)
Glucose-Capillary: 160 mg/dL — ABNORMAL HIGH (ref 70–99)

## 2021-01-07 LAB — HEPATITIS PANEL, ACUTE
HCV Ab: NONREACTIVE
Hep A IgM: NONREACTIVE
Hep B C IgM: NONREACTIVE
Hepatitis B Surface Ag: NONREACTIVE

## 2021-01-07 LAB — URIC ACID: Uric Acid, Serum: 7.8 mg/dL (ref 3.7–8.6)

## 2021-01-07 LAB — PHOSPHORUS: Phosphorus: 5.4 mg/dL — ABNORMAL HIGH (ref 2.5–4.6)

## 2021-01-07 LAB — SODIUM, URINE, RANDOM: Sodium, Ur: 58 mmol/L

## 2021-01-07 LAB — MAGNESIUM: Magnesium: 2 mg/dL (ref 1.7–2.4)

## 2021-01-07 MED ORDER — INSULIN ASPART 100 UNIT/ML ~~LOC~~ SOLN
4.0000 [IU] | Freq: Three times a day (TID) | SUBCUTANEOUS | Status: DC
  Administered 2021-01-07 – 2021-01-16 (×19): 4 [IU] via SUBCUTANEOUS

## 2021-01-07 MED ORDER — OXYCODONE HCL 5 MG PO TABS
10.0000 mg | ORAL_TABLET | ORAL | Status: DC | PRN
  Administered 2021-01-07 – 2021-01-11 (×14): 10 mg via ORAL
  Filled 2021-01-07 (×15): qty 2

## 2021-01-07 MED ORDER — INSULIN ASPART 100 UNIT/ML ~~LOC~~ SOLN
0.0000 [IU] | Freq: Every day | SUBCUTANEOUS | Status: DC
  Administered 2021-01-11 – 2021-01-15 (×2): 2 [IU] via SUBCUTANEOUS

## 2021-01-07 MED ORDER — INSULIN GLARGINE 100 UNIT/ML ~~LOC~~ SOLN
35.0000 [IU] | Freq: Every day | SUBCUTANEOUS | Status: DC
  Administered 2021-01-07 – 2021-01-14 (×8): 35 [IU] via SUBCUTANEOUS
  Filled 2021-01-07 (×9): qty 0.35

## 2021-01-07 MED ORDER — INSULIN ASPART 100 UNIT/ML ~~LOC~~ SOLN
0.0000 [IU] | Freq: Three times a day (TID) | SUBCUTANEOUS | Status: DC
  Administered 2021-01-07: 15 [IU] via SUBCUTANEOUS
  Administered 2021-01-08 (×2): 3 [IU] via SUBCUTANEOUS
  Administered 2021-01-08: 5 [IU] via SUBCUTANEOUS
  Administered 2021-01-09 – 2021-01-10 (×2): 3 [IU] via SUBCUTANEOUS
  Administered 2021-01-12: 8 [IU] via SUBCUTANEOUS
  Administered 2021-01-12: 5 [IU] via SUBCUTANEOUS
  Administered 2021-01-12: 15 [IU] via SUBCUTANEOUS
  Administered 2021-01-13: 3 [IU] via SUBCUTANEOUS
  Administered 2021-01-14: 2 [IU] via SUBCUTANEOUS
  Administered 2021-01-15: 5 [IU] via SUBCUTANEOUS
  Administered 2021-01-16 (×2): 2 [IU] via SUBCUTANEOUS

## 2021-01-07 MED ORDER — HYDRALAZINE HCL 25 MG PO TABS
25.0000 mg | ORAL_TABLET | Freq: Two times a day (BID) | ORAL | Status: DC
  Administered 2021-01-07 – 2021-01-08 (×3): 25 mg via ORAL
  Filled 2021-01-07 (×3): qty 1

## 2021-01-07 NOTE — Progress Notes (Addendum)
   PODIATRY PROGRESS NOTE SUDEYS PRUNER [id number] DOB:10-25-1956 DOA:01/04/2021 EP:[id number], Fernando A, MD  Brief narrative: Patient S/P incision and drainage with bone biopsy and placement of antibiotic beads right heel.  DOS: 01/06/2021.  Patient resting comfortably in bed.  Pain is being managed however he is requesting Anthony stronger dosage of Percocet.  Dressings are intact with some strikethrough noted.  CBC Latest Ref Rng & Units 01/07/2021 01/06/2021 01/05/2021  WBC 4.0 - 10.5 K/uL 12.1(H) 12.9(H) 15.3(H)  Hemoglobin 13.0 - 17.0 g/dL 9.4(L) 9.5(L) 8.1(L)  Hematocrit 39.0 - 52.0 % 28.2(L) 28.9(L) 24.7(L)  Platelets 150 - 400 K/uL 202 224 196   BMP Latest Ref Rng & Units 01/07/2021 01/06/2021 01/05/2021  Glucose 70 - 99 mg/dL 229(H) 133(H) 127(H)  BUN 8 - 23 mg/dL 65(H) 66(H) 56(H)  Creatinine 0.61 - 1.24 mg/dL 5.27(H) 4.52(H) 4.05(H)  Sodium 135 - 145 mmol/L 133(L) 131(L) 128(L)  Potassium 3.5 - 5.1 mmol/L 3.6 3.5 3.2(L)  Chloride 98 - 111 mmol/L 101 99 94(L)  CO2 22 - 32 mmol/L 20(L) 22 23  Calcium 8.9 - 10.3 mg/dL 7.5(L) 7.4(L) 7.2(L)   Assessment/plan of care: S/p I&D w/ bone biopsy and placement of Abx beads RT -Dressings changed today.  Keep clean dry and intact.  -Order for Percocet modified from '5mg'$  to '10mg'$  every 4 hours PRN -Bone biopsy results and cultures pending -Continue IV antibiotics as per hospitalist -Podiatry will continue to follow. Will round on patient again when bone biopsy results are available to discuss and dressing change at that time  *Thank you for the consult. Please contact me directly with any questions or concerns. Cell [phone].    Edrick Kins, DPM [facility]  Dr. Edrick Kins, DPM    [address], [place] [address]                Office [phone]  Fax [phone]

## 2021-01-07 NOTE — Consult Note (Signed)
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  He appears to have had relatively good renal function as recently as 10 months ago with baseline creatinine 1.0-1.2.  He was admitted to the hospital with increasing leg swelling/pain and purulent drainage from the plantar wound raising concern for osteomyelitis.  Noted to have an elevated creatinine of 3.9 on admission that has gradually risen up to 5.3 despite intravenous fluids/supportive management.  He reports that for about 2 weeks prior to admission, he had erratic oral intake with postprandial vomiting/poor fluid intake as he continued to take HCTZ, furosemide, lisinopril and OTC ibuprofen.  He denies any hematuria, dysuria or frequency but reports some urgency for the last 2 weeks.  He denies any flank pain, fever or chills.  He denies any skin rash, epistaxis or hemoptysis.  He denies any abnormal limb jerking movements or dysgeusia.  He reports that his father was on dialysis prior to his demise-ESRD related to his history of heart disease.  He does not have any additional strong family history of renal disease or autoimmune disorders including lupus.  Earlier done renal ultrasound showed evidence of chronic kidney disease without obstruction.  He had received a single dose of vancomycin with a random level of 10.  It does not appear that he has had any significant hypotension or iodinated intravenous contrast exposure.  Past Medical History:  Diagnosis Date  . Back pain   . Diabetes mellitus   . Headache(784.0)    general  . Hyperlipidemia   . Hypertension   . Osteomyelitis ([place]) 11/2018   RIGHT FOOT     Past Surgical History:  Procedure Laterality Date  . AMPUTATION Right 09/18/2013   Procedure: Amputation Right Great Toe at [facility];  Surgeon: Newt Minion, MD;  Location: [place];  Service: Orthopedics;  Laterality: Right;  Amputation Right Great Toe at [facility]  . AMPUTATION Right 12/05/2018   Procedure: PARTIAL AMPUTATION FIRST RAY RIGHT FOOT;  Surgeon: Edrick Kins, DPM;  Location: [place];  Service: Podiatry;  Laterality: Right;  . CARDIAC CATHETERIZATION  ?1990  . SPINE SURGERY      Family History  Problem Relation Age of Onset  . Cancer Mother   . Heart disease Father     Social History:  reports that he has been smoking cigarettes. He has a 40.00 pack-year smoking history. He has never used smokeless tobacco. He reports previous alcohol use. He reports previous drug use.  Allergies:  Allergies  Allergen Reactions  . Vicodin [Hydrocodone-Acetaminophen] Itching    Medications:  Scheduled: . amLODipine  10 mg Oral Daily  . aspirin EC  81 mg Oral QHS  . atenolol  50 mg Oral Daily  . cloNIDine  0.1 mg Oral BID  . hydrALAZINE  12.5 mg Oral BID  . insulin aspart  0-15 Units Subcutaneous TID WC  . insulin aspart  0-5 Units Subcutaneous QHS  . insulin glargine  25 Units Subcutaneous QHS  . omega-3 acid ethyl esters  1 g Oral Daily  . pregabalin  50 mg Oral TID    BMP Latest Ref Rng & Units 01/07/2021 01/06/2021 01/05/2021  Glucose 70 - 99 mg/dL 229(H) 133(H) 127(H)  BUN 8 - 23  mg/dL 65(H) 66(H) 56(H)  Creatinine 0.61 - 1.24 mg/dL 5.27(H) 4.52(H) 4.05(H)  Sodium 135 - 145 mmol/L 133(L) 131(L) 128(L)  Potassium 3.5 - 5.1 mmol/L 3.6 3.5 3.2(L)  Chloride 98 - 111 mmol/L 101 99 94(L)  CO2 22 - 32 mmol/L 20(L) 22 23  Calcium 8.9 - 10.3 mg/dL 7.5(L) 7.4(L) 7.2(L)   CBC Latest Ref Rng & Units 01/07/2021 01/06/2021 01/05/2021  WBC 4.0 - 10.5 K/uL 12.1(H) 12.9(H) 15.3(H)  Hemoglobin 13.0 - 17.0 g/dL 9.4(L) 9.5(L) 8.1(L)  Hematocrit 39.0 - 52.0 % 28.2(L) 28.9(L) 24.7(L)  Platelets 150 -  400 K/uL 202 224 196     US RENAL  Result Date: 01/05/2021 CLINICAL DATA:  Acute kidney injury EXAM: RENAL / URINARY TRACT ULTRASOUND COMPLETE COMPARISON:  None recent FINDINGS: Right Kidney: Renal measurements: 12.2 x 6.7 x 6.4 cm = volume: 272 mL. There is no hydronephrosis. There is increased cortical echogenicity. The cortex is heterogeneous in appearance. Left Kidney: Renal measurements: 13.7 x 6.1 x 5.7 cm = volume: 248 mL. There is no hydronephrosis. There is increased cortical echogenicity. The cortex is heterogeneous. Bladder: The bladder is decompressed. However, there appears to be some bladder wall thickening. Other: There is a trace amount of free fluid in the upper abdomen. IMPRESSION: 1. No hydronephrosis. 2. Echogenic kidneys bilaterally which can be seen in patients with medical renal disease. 3. Heterogeneous appearance of both kidneys which is a nonspecific finding but can be seen in patients with pyelonephritis. Correlation with urinalysis is recommended. 4. Bladder wall thickening which may be secondary to underdistention versus cystitis versus chronic outlet obstruction. 5. Trace free fluid in the upper abdomen. Electronically Signed   By: Constance Holster M.D.   On: 01/05/2021 15:17   DG Foot Complete Right  Result Date: 01/06/2021 CLINICAL DATA:  Postop. Incision and drainage with bone biopsy and placement of antibiotic beads. EXAM: RIGHT FOOT COMPLETE - 3+ VIEW COMPARISON:  Radiograph and MRI 01/04/2021 FINDINGS: Interval placement of antibiotic beads subjacent to the calcaneus with overlying skin staples. Patient has prior transmetatarsal amputation with smooth resection margin. IMPRESSION: Interval placement of antibiotic beads subjacent to the calcaneus. Electronically Signed   By: Keith Rake M.D.   On: 01/06/2021 21:57    Review of Systems  Constitutional: Positive for appetite change, chills and fatigue. Negative for fever.  HENT: Negative for ear discharge,  nosebleeds, sinus pain, sore throat and trouble swallowing.   Eyes: Positive for visual disturbance. Negative for redness and itching.       Visual disturbance related to hyperglycemia  Respiratory: Negative for cough, chest tightness and shortness of breath.   Cardiovascular: Positive for leg swelling. Negative for chest pain.  Gastrointestinal: Positive for nausea and vomiting. Negative for abdominal pain, constipation and diarrhea.  Endocrine: Positive for cold intolerance and polydipsia.  Genitourinary: Positive for decreased urine volume and difficulty urinating. Negative for hematuria and urgency.  Musculoskeletal: Positive for myalgias.       Of right leg  Skin: Positive for wound.       Right leg plantar ulcer  Neurological: Positive for weakness and headaches. Negative for dizziness.   Blood pressure (!) 151/83, pulse 68, temperature (!) 97.4 F (36.3 C), temperature source Oral, resp. rate 18, height '6\' 2"'$  (1.88 m), weight 101.9 kg, SpO2 98 %. Physical Exam Vitals and nursing note reviewed.  Constitutional:      Appearance: Normal appearance. He is obese. He is not ill-appearing or toxic-appearing.  HENT:     Head:  Normocephalic and atraumatic.     Right Ear: External ear normal.     Left Ear: External ear normal.     Nose: Nose normal.     Mouth/Throat:     Mouth: Mucous membranes are moist.     Pharynx: No oropharyngeal exudate.  Eyes:     General: No scleral icterus.    Conjunctiva/sclera: Conjunctivae normal.     Pupils: Pupils are equal, round, and reactive to light.  Cardiovascular:     Rate and Rhythm: Normal rate and regular rhythm.     Pulses: Normal pulses.     Heart sounds: No murmur heard.   Pulmonary:     Effort: Pulmonary effort is normal.     Breath sounds: Normal breath sounds. No wheezing.  Abdominal:     General: Abdomen is flat. There is distension.     Palpations: Abdomen is soft.     Tenderness: There is no abdominal tenderness. There is no  guarding.  Musculoskeletal:     Cervical back: Normal range of motion and neck supple.     Right lower leg: Edema present.     Left lower leg: Edema present.     Comments: 1-2+ bilateral pitting lower extremity edema.  Right forefoot in dressing  Skin:    General: Skin is warm and dry.  Neurological:     General: No focal deficit present.     Mental Status: He is alert.  Psychiatric:        Mood and Affect: Mood normal.    Assessment/Plan: 1.  Acute kidney injury: Nonoliguric and based on history/timeline of events appears to have been hemodynamically mediated acute kidney injury (further corroborated by significantly depressed fractional excretion of sodium in the setting of diuretic use).  Based on his rising creatinine despite intravenous fluids, I suspect that he probably has evolved into mild hemodynamic/ischemic ATN and supportive management is warranted at this time to limit additional renal injury from hypoperfusion.  He does not have any acute electrolyte abnormality/significant volume overload to warrant hemodialysis.  I will check a serum protein electrophoresis and free light chains along with C3 and C4 inquiring into the possibility of plasma cell dyscrasia as well as infection associated GN. Avoid nephrotoxic medications including NSAIDs and iodinated intravenous contrast exposure unless the latter is absolutely indicated.  Preferred narcotic agents for pain control are hydromorphone, fentanyl, and methadone. Morphine should not be used. Avoid Baclofen and avoid oral sodium phosphate and magnesium citrate based laxatives / bowel preps. Continue strict Input and Output monitoring. Will monitor the patient closely with you and intervene or adjust therapy as indicated by changes in clinical status/labs. 2.  Acute osteomyelitis of the right foot: Status post surgical debridement earlier by podiatry with implantation of antibiotic beads.  Continue systemic antibiotics. 3.  Anemia: Likely  secondary to chronic illness including his recent chronic infection/osteomyelitis.  Low iron saturation however, infection precluding intravenous iron administration.  Continue to monitor hemoglobin/hematocrit trend. 4.  Hypertension: Intermittently elevated blood pressures noted-diuretic/ACE inhibitor currently on hold; bridge with hydralazine at this time while having restarted his usual dosing of amlodipine, atenolol and clonidine. 5.  Diabetes mellitus: Insulin-dependent and poorly controlled at baseline-management per primary service.   Talia Hoheisel K. 01/07/2021, 12:32 PM

## 2021-01-07 NOTE — Progress Notes (Signed)
PROGRESS NOTE  Fernando Anthony [id number] DOB: 03/26/1956   PCP: Curly Rim, MD  Patient is from: Home.  DOA: 01/04/2021 LOS: 3  Chief complaints: Right foot ulcer  Brief Narrative / Interim history: 65 year old male with PMH of IDDM-2, right foot osteo s/p TMT in 2020, HLD and HTN sent to ED by podiatry, Dr. Amalia Hailey for right heel abscess and drainage.  MRI right foot concerning for calcaneal early osteomyelitis.  Inflammatory markers elevated.  On IV daptomycin and Rocephin.  Patient underwent incision and drainage of his right foot infection on 01/06/2021.  Renal function continued to rise despite IV fluid hydration.  Nephrology consulted.  Subjective: Seen and examined earlier this morning.  No major events overnight of this morning.  No complaints other than surgical site pain which is fairly controlled.  He denies chest pain, dyspnea, GI or UTI symptoms.  Reports good urine output.  He denies hematuria.   Objective: Vitals:   01/07/21 0146 01/07/21 0631 01/07/21 0844 01/07/21 1324  BP: (!) 163/82 (!) 169/89 (!) 151/83 (!) 145/79  Pulse: 64 67 68 70  Resp: '17 17 18 18  '$ Temp: 97.7 F (36.5 C) (!) 97.5 F (36.4 C) (!) 97.4 F (36.3 C) (!) 97.5 F (36.4 C)  TempSrc: Oral Oral Oral Oral  SpO2: 92% 97% 98% 97%  Weight:      Height:        Intake/Output Summary (Last 24 hours) at 01/07/2021 1455 Last data filed at 01/07/2021 1000 Gross per 24 hour  Intake 3276.4 ml  Output 410 ml  Net 2866.4 ml   Filed Weights   01/04/21 1741 01/04/21 2244 01/06/21 1813  Weight: 97.5 kg 101.9 kg 101.9 kg    Examination:  GENERAL: No apparent distress.  Nontoxic. HEENT: MMM.  Vision and hearing grossly intact.  NECK: Supple.  No apparent JVD.  RESP: On RA.  No IWOB.  Fair aeration bilaterally. CVS:  RRR. Heart sounds normal.  ABD/GI/GU: BS+. Abd soft, NTND.  MSK/EXT:  Moves extremities. No apparent deformity.  Ace wrap and dressing over right foot DCI. SKIN: Ace wrap around  dressing over right foot NEURO: Awake, alert and oriented appropriately.  No apparent focal neuro deficit. PSYCH: Calm. Normal affect.   Procedures:  None  Microbiology summarized: [id number] and influenza PCR nonreactive. MRSA PCR screen nonreactive. Blood cultures NGTD. Superficial wound culture with abundant GPC, GNR and GPR. 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 Sed rate 129, CRP 19.3. Cultures as above.  Leukocytosis improving. -Continue daptomycin and Rocephin -Follow cultures.  AKI/azotemia: a combination of prerenal and ATN.  Was on multiple nephrotoxic meds including NSAIDs prior to arrival.  FeNa 0.4% Renal US with medical renal disease, bladder wall thickening.  UA with small Hgb and 100 protein.  Creatinine rising despite IV fluid. Recent Labs    01/04/21 1604 01/05/21 0338 01/06/21 0330 01/07/21 0325  BUN 56* 56* 66* 65*  CREATININE 3.94* 4.05* 4.52* 5.27*  -Continue IV fluids -Avoid nephrotoxic meds. 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 Recent Labs  Lab 01/06/21 2111 01/06/21 2139 01/06/21 2221 01/07/21 0717 01/07/21 1117  GLUCAP 66* 131* 142* 245* 337*  -Increase Lantus from 25 to 35 units -Add NovoLog AC at 4 units -Continue SSI moderate with nightly coverage  Essential HTN: SBP slightly elevated.  Could be driven by IVF and pain. -Continue amlodipine, atenolol, clonidine -Increase  hydralazine -Hold Lasix, HCTZ, lisinopril due to AKI  Elevated LFTs/ALP/total bili: Pattern suggests EtOH use rhabdo but not a drinker.  CK within normal.  Crestor might contribute.  Acute hepatitis panel without significant finding. -Hold Crestor. -Continue trending -Follow RUQ [place] -Check GGT  Hyponatremia: Likely due to AKI.  Improved. -Continue monitoring  Hyperphosphatemia: Likely due to renal  failure. -Continue monitoring  HLD -Hold Crestor given elevated LFT  Hypokalemia: Resolved.  Iron deficiency anemia: H&H stable. -Hold off on supplementation IV with acute infectious process  Body mass index is 28.84 kg/m.         DVT prophylaxis:  SCDs Start: 01/06/21 2227 SCDs Start: 01/04/21 1945  Code Status: Full code Family Communication: Updated patient's wife at bedside. Level of care: Med-Surg Status is: Inpatient  Remains inpatient appropriate because:Ongoing active pain requiring inpatient pain management, Ongoing diagnostic testing needed not appropriate for outpatient work up, IV treatments appropriate due to intensity of illness or inability to take PO and Inpatient level of care appropriate due to severity of illness   Dispo: The patient is from: Home              Anticipated d/c is to: Home              Anticipated d/c date is: > 3 days              Patient currently is not medically stable to d/c.   Difficult to place patient No       Consultants:  Podiatry Nephrology   Sch Meds:  Scheduled Meds: . amLODipine  10 mg Oral Daily  . aspirin EC  81 mg Oral QHS  . atenolol  50 mg Oral Daily  . cloNIDine  0.1 mg Oral BID  . hydrALAZINE  12.5 mg Oral BID  . insulin aspart  0-15 Units Subcutaneous TID WC  . insulin aspart  0-5 Units Subcutaneous QHS  . insulin glargine  25 Units Subcutaneous QHS  . omega-3 acid ethyl esters  1 g Oral Daily  . pregabalin  50 mg Oral TID   Continuous Infusions: . sodium chloride 75 mL/hr at 01/07/21 0755  . cefTRIAXone (ROCEPHIN)  IV 2 g (01/06/21 2238)  . DAPTOmycin (CUBICIN)  IV     PRN Meds:.acetaminophen, fentaNYL (SUBLIMAZE) injection, morphine injection, ondansetron (ZOFRAN) IV, oxyCODONE, polyethylene glycol, promethazine, senna-docusate, sodium chloride flush  Antimicrobials: Anti-infectives (From admission, onward)   Start     Dose/Rate Route Frequency Ordered Stop   01/07/21 2000  DAPTOmycin  (CUBICIN) 800 mg in sodium chloride 0.9 % IVPB        800 mg 232 mL/hr over 30 Minutes Intravenous Every 48 hours 01/05/21 0809     01/06/21 2040  tobramycin (NEBCIN) powder  Status:  Discontinued          As needed 01/06/21 2110 01/06/21 2212   01/06/21 2040  vancomycin (VANCOCIN) powder  Status:  Discontinued          As needed 01/06/21 2111 01/06/21 2212   01/05/21 1000  DAPTOmycin (CUBICIN) 800 mg in sodium chloride 0.9 % IVPB        800 mg 232 mL/hr over 30 Minutes Intravenous  Once 01/05/21 0809 01/05/21 1139   01/05/21 0900  vancomycin (VANCOREADY) IVPB 1250 mg/250 mL  Status:  Discontinued        1,250 mg 166.7 mL/hr over 90 Minutes Intravenous  Once 01/05/21 0741 01/05/21 0748   01/05/21 0741  vancomycin variable dose per unstable  renal function (pharmacist dosing)  Status:  Discontinued         Does not apply See admin instructions 01/05/21 0741 01/05/21 0748   01/04/21 2000  cefTRIAXone (ROCEPHIN) 2 g in sodium chloride 0.9 % 100 mL IVPB        2 g 200 mL/hr over 30 Minutes Intravenous Every 24 hours 01/04/21 1944     01/04/21 1700  vancomycin (VANCOCIN) IVPB 1000 mg/200 mL premix        1,000 mg 200 mL/hr over 60 Minutes Intravenous  Once 01/04/21 1650 01/04/21 1900       I have personally reviewed the following labs and images: CBC: Recent Labs  Lab 01/04/21 1604 01/05/21 0338 01/06/21 0330 01/07/21 0325  WBC 25.3* 15.3* 12.9* 12.1*  NEUTROABS 23.3*  --   --  10.9*  HGB 9.9* 8.1* 9.5* 9.4*  HCT 30.6* 24.7* 28.9* 28.2*  MCV 96.2 93.9 93.8 92.2  PLT 240 196 224 202   BMP &GFR Recent Labs  Lab 01/04/21 1604 01/05/21 0338 01/05/21 0355 01/06/21 0330 01/07/21 0325  NA 129* 128*  --  131* 133*  K 3.4* 3.2*  --  3.5 3.6  CL 91* 94*  --  99 101  CO2 24 23  --  22 20*  GLUCOSE 239* 127*  --  133* 229*  BUN 56* 56*  --  66* 65*  CREATININE 3.94* 4.05*  --  4.52* 5.27*  CALCIUM 7.9* 7.2*  --  7.4* 7.5*  MG  --   --  1.9  --  2.0  PHOS  --   --   --   --   5.4*   Estimated Creatinine Clearance: 18.3 mL/min (A) (by C-G formula based on SCr of 5.27 mg/dL (H)). Liver & Pancreas: Recent Labs  Lab 01/04/21 1604 01/05/21 0355 01/06/21 0330 01/07/21 0325  AST 218* 149* 243* 212*  ALT 116* 92* 133* 143*  ALKPHOS 409* 370* 612* 682*  BILITOT 2.1* 1.6* 2.8* 3.2*  PROT 6.4* 5.3* 6.0* 5.8*  ALBUMIN 2.3* 1.8* 2.0* 1.7*   No results for input(s): LIPASE, AMYLASE in the last 168 hours. No results for input(s): AMMONIA in the last 168 hours. Diabetic: Recent Labs    01/04/21 2253  HGBA1C 10.5*   Recent Labs  Lab 01/06/21 2111 01/06/21 2139 01/06/21 2221 01/07/21 0717 01/07/21 1117  GLUCAP 66* 131* 142* 245* 337*   Cardiac Enzymes: Recent Labs  Lab 01/06/21 0330  CKTOTAL 65   No results for input(s): PROBNP in the last 8760 hours. Coagulation Profile: Recent Labs  Lab 01/04/21 1649  INR 1.2   Thyroid Function Tests: No results for input(s): TSH, T4TOTAL, FREET4, T3FREE, THYROIDAB in the last 72 hours. Lipid Profile: No results for input(s): CHOL, HDL, LDLCALC, TRIG, CHOLHDL, LDLDIRECT in the last 72 hours. Anemia Panel: Recent Labs    01/05/21 0338  VITAMINB12 1,664*  FOLATE 22.9  TIBC 198*  IRON 15*   Urine analysis:    Component Value Date/Time   COLORURINE YELLOW 01/07/2021 1209   APPEARANCEUR CLEAR 01/07/2021 1209   LABSPEC 1.009 01/07/2021 1209   PHURINE 6.0 01/07/2021 1209   GLUCOSEU >=500 (A) 01/07/2021 1209   HGBUR SMALL (A) 01/07/2021 1209   BILIRUBINUR NEGATIVE 01/07/2021 1209   KETONESUR 5 (A) 01/07/2021 1209   PROTEINUR 100 (A) 01/07/2021 1209   NITRITE NEGATIVE 01/07/2021 1209   LEUKOCYTESUR NEGATIVE 01/07/2021 1209   Sepsis Labs: Invalid input(s): PROCALCITONIN, [place]  Microbiology: Recent Results (from the past 240  hour(s))  WOUND CULTURE     Status: Abnormal (Preliminary result)   Collection Time: 01/04/21 12:13 PM  Result Value Ref Range Status   MICRO NUMBER: [id number]   Preliminary   SPECIMEN QUALITY: Adequate  Preliminary   SOURCE: WOUND (SITE NOT SPECIFIED)  Preliminary   STATUS: PRELIMINARY  Preliminary   GRAM STAIN:   Preliminary    Rare Polymorphonuclear leukocytes Few epithelial cells Many Gram positive cocci in pairs Many Gram positive cocci in clusters Many Gram negative bacilli   ISOLATE 1: Group G Streptococcus (A)  Preliminary    Comment: Heavy growth of Group G Streptococcus Beta-hemolytic streptococci are predictably susceptible to Penicillin and other beta-lactams. Susceptibility testing not routinely performed. Please contact the laboratory within 3 days if susceptibility testing is  desired.   Aerobic Culture w Gram Stain (superficial specimen)     Status: None (Preliminary result)   Collection Time: 01/04/21  5:19 PM   Specimen: Foot; Wound  Result Value Ref Range Status   Specimen Description   Final    FOOT RIGHT Performed at [facility] [address] [place], [place] [address]    Special Requests   Final    NONE Performed at [facility], [place] [address] [place], [place] [address]    Gram Stain   Final    MODERATE WBC PRESENT, PREDOMINANTLY PMN ABUNDANT GRAM POSITIVE COCCI ABUNDANT GRAM NEGATIVE RODS MODERATE GRAM POSITIVE RODS    Culture   Final    FEW CITROBACTER KOSERI CULTURE REINCUBATED FOR BETTER GROWTH Performed at [facility], [place] [address], [place], [place] [address]    Report Status PENDING  Incomplete   Organism ID, Bacteria CITROBACTER KOSERI  Final      Susceptibility   Citrobacter koseri - MIC*    CEFAZOLIN <=4 SENSITIVE Sensitive     CEFEPIME <=0.12 SENSITIVE Sensitive     CEFTAZIDIME <=1 SENSITIVE Sensitive     CEFTRIAXONE <=0.25 SENSITIVE Sensitive     CIPROFLOXACIN <=0.25 SENSITIVE Sensitive     GENTAMICIN <=1 SENSITIVE Sensitive     IMIPENEM <=0.25 SENSITIVE Sensitive     TRIMETH/SULFA <=20 SENSITIVE Sensitive     PIP/TAZO 8 SENSITIVE Sensitive      * FEW CITROBACTER KOSERI  Culture, blood (routine x 2)     Status: None (Preliminary result)   Collection Time: 01/04/21  5:23 PM   Specimen: BLOOD  Result Value Ref Range Status   Specimen Description   Final    BLOOD LEFT ANTECUBITAL Performed at [facility] [address], [place], [place] [address]    Special Requests   Final    BOTTLES DRAWN AEROBIC AND ANAEROBIC Blood Culture results may not be optimal due to an inadequate volume of blood received in culture bottles Performed at [facility] [address], [place], [place] [address]    Culture   Final    NO GROWTH 3 DAYS Performed at [facility], [place] [address] [place], [place] [address]    Report Status PENDING  Incomplete  Culture, blood (routine x 2)     Status: None (Preliminary result)   Collection Time: 01/04/21  6:04 PM   Specimen: BLOOD  Result Value Ref Range Status   Specimen Description   Final    BLOOD RIGHT ANTECUBITAL Performed at [facility] [address] [place], [place] [address]    Special Requests   Final    BOTTLES DRAWN  AEROBIC AND ANAEROBIC Blood Culture adequate volume Performed at [facility] [address], [place], [place] [address]    Culture   Final    NO GROWTH 3 DAYS Performed at [facility], [place] [address], [place], [place] [address]    Report Status PENDING  Incomplete  Resp Panel by RT-PCR (Flu A&B, Covid) Nasopharyngeal Swab     Status: None   Collection Time: 01/04/21  6:05 PM   Specimen: Nasopharyngeal Swab; Nasopharyngeal(NP) swabs in vial transport medium  Result Value Ref Range Status   SARS Coronavirus 2 by RT PCR NEGATIVE NEGATIVE Final    Comment: (NOTE) SARS-CoV-2 target nucleic acids are NOT DETECTED.  The SARS-CoV-2 RNA is generally detectable in upper respiratory specimens during the acute phase of infection. The lowest concentration of SARS-CoV-2 viral copies this assay  can detect is 138 copies/mL. A negative result does not preclude SARS-Cov-2 infection and should not be used as the sole basis for treatment or other patient management decisions. A negative result may occur with  improper specimen collection/handling, submission of specimen other than nasopharyngeal swab, presence of viral mutation(s) within the areas targeted by this assay, and inadequate number of viral copies(<138 copies/mL). A negative result must be combined with clinical observations, patient history, and epidemiological information. The expected result is Negative.  Fact Sheet for Patients:  EntrepreneurPulse.com.au  Fact Sheet for Healthcare Providers:  IncredibleEmployment.be  This test is no t yet approved or cleared by the [place] FDA and  has been authorized for detection and/or diagnosis of SARS-CoV-2 by FDA under an Emergency Use Authorization (EUA). This EUA will remain  in effect (meaning this test can be used) for the duration of the COVID-19 declaration under Section 564(b)(1) of the Act, 21 U.S.C.section 360bbb-3(b)(1), unless the authorization is terminated  or revoked sooner.       Influenza A by PCR NEGATIVE NEGATIVE Final   Influenza B by PCR NEGATIVE NEGATIVE Final    Comment: (NOTE) The Xpert Xpress SARS-CoV-2/FLU/RSV plus assay is intended as an aid in the diagnosis of influenza from Nasopharyngeal swab specimens and should not be used as a sole basis for treatment. Nasal washings and aspirates are unacceptable for Xpert Xpress SARS-CoV-2/FLU/RSV testing.  Fact Sheet for Patients: EntrepreneurPulse.com.au  Fact Sheet for Healthcare Providers: IncredibleEmployment.be  This test is not yet approved or cleared by the [place] FDA and has been authorized for detection and/or diagnosis of SARS-CoV-2 by FDA under an Emergency Use Authorization (EUA). This EUA will  remain in effect (meaning this test can be used) for the duration of the COVID-19 declaration under Section 564(b)(1) of the Act, 21 U.S.C. section 360bbb-3(b)(1), unless the authorization is terminated or revoked.  Performed at [facility], [place] [address] [place], [place] [address]   Surgical PCR screen     Status: None   Collection Time: 01/04/21 10:41 PM   Specimen: Nasal Mucosa; Nasal Swab  Result Value Ref Range Status   MRSA, PCR NEGATIVE NEGATIVE Final   Staphylococcus aureus NEGATIVE NEGATIVE Final    Comment: (NOTE) The Xpert SA Assay (FDA approved for NASAL specimens in patients 77 years of age and older), is one component of a comprehensive surveillance program. It is not intended to diagnose infection nor to guide or monitor treatment. Performed at [facility], [place] [address], [place],  [address]   Aerobic/Anaerobic Culture w Gram Stain (surgical/deep wound)     Status: None (Preliminary result)  Collection Time: 01/06/21  8:46 PM   Specimen: Abscess  Result Value Ref Range Status   Specimen Description   Final    ABSCESS RIGHT HEEL Performed at [facility] [address] [place], [place] [address]    Special Requests   Final    NONE Performed at [facility], [place] [address] [place], [place] [address]    Gram Stain   Final    NO WBC SEEN RARE GRAM POSITIVE COCCI Performed at [facility], [place] [address] [place], [place] [address]    Culture PENDING  Incomplete   Report Status PENDING  Incomplete    Radiology Studies: DG Foot Complete Right  Result Date: 01/06/2021 CLINICAL DATA:  Postop. Incision and drainage with bone biopsy and placement of antibiotic beads. EXAM: RIGHT FOOT COMPLETE - 3+ VIEW COMPARISON:  Radiograph and MRI 01/04/2021 FINDINGS: Interval placement of antibiotic beads subjacent to the calcaneus with overlying skin staples. Patient has prior  transmetatarsal amputation with smooth resection margin. IMPRESSION: Interval placement of antibiotic beads subjacent to the calcaneus. Electronically Signed   By: Keith Rake M.D.   On: 01/06/2021 21:57      Fernando Anthony  If 7PM-7AM, please contact night-coverage [email or web address] 01/07/2021, 2:55 PM

## 2021-01-08 DIAGNOSIS — R945 Abnormal results of liver function studies: Secondary | ICD-10-CM | POA: Diagnosis not present

## 2021-01-08 DIAGNOSIS — N179 Acute kidney failure, unspecified: Secondary | ICD-10-CM | POA: Diagnosis not present

## 2021-01-08 DIAGNOSIS — M86171 Other acute osteomyelitis, right ankle and foot: Secondary | ICD-10-CM | POA: Diagnosis not present

## 2021-01-08 DIAGNOSIS — E871 Hypo-osmolality and hyponatremia: Secondary | ICD-10-CM | POA: Diagnosis not present

## 2021-01-08 LAB — CBC WITH DIFFERENTIAL/PLATELET
Abs Immature Granulocytes: 0.3 10*3/uL — ABNORMAL HIGH (ref 0.00–0.07)
Basophils Absolute: 0 10*3/uL (ref 0.0–0.1)
Basophils Relative: 0 %
Eosinophils Absolute: 0 10*3/uL (ref 0.0–0.5)
Eosinophils Relative: 0 %
HCT: 27.6 % — ABNORMAL LOW (ref 39.0–52.0)
Hemoglobin: 9.3 g/dL — ABNORMAL LOW (ref 13.0–17.0)
Immature Granulocytes: 2 %
Lymphocytes Relative: 6 %
Lymphs Abs: 1 10*3/uL (ref 0.7–4.0)
MCH: 31.4 pg (ref 26.0–34.0)
MCHC: 33.7 g/dL (ref 30.0–36.0)
MCV: 93.2 fL (ref 80.0–100.0)
Monocytes Absolute: 2.1 10*3/uL — ABNORMAL HIGH (ref 0.1–1.0)
Monocytes Relative: 13 %
Neutro Abs: 12.4 10*3/uL — ABNORMAL HIGH (ref 1.7–7.7)
Neutrophils Relative %: 79 %
Platelets: 200 10*3/uL (ref 150–400)
RBC: 2.96 MIL/uL — ABNORMAL LOW (ref 4.22–5.81)
RDW: 13.6 % (ref 11.5–15.5)
WBC: 15.8 10*3/uL — ABNORMAL HIGH (ref 4.0–10.5)
nRBC: 0 % (ref 0.0–0.2)

## 2021-01-08 LAB — COMPREHENSIVE METABOLIC PANEL
ALT: 136 U/L — ABNORMAL HIGH (ref 0–44)
AST: 167 U/L — ABNORMAL HIGH (ref 15–41)
Albumin: 1.7 g/dL — ABNORMAL LOW (ref 3.5–5.0)
Alkaline Phosphatase: 680 U/L — ABNORMAL HIGH (ref 38–126)
Anion gap: 10 (ref 5–15)
BUN: 73 mg/dL — ABNORMAL HIGH (ref 8–23)
CO2: 19 mmol/L — ABNORMAL LOW (ref 22–32)
Calcium: 7.3 mg/dL — ABNORMAL LOW (ref 8.9–10.3)
Chloride: 105 mmol/L (ref 98–111)
Creatinine, Ser: 5.4 mg/dL — ABNORMAL HIGH (ref 0.61–1.24)
GFR, Estimated: 11 mL/min — ABNORMAL LOW (ref >60)
Glucose, Bld: 188 mg/dL — ABNORMAL HIGH (ref 70–99)
Potassium: 3.5 mmol/L (ref 3.5–5.1)
Sodium: 134 mmol/L — ABNORMAL LOW (ref 135–145)
Total Bilirubin: 2.3 mg/dL — ABNORMAL HIGH (ref 0.3–1.2)
Total Protein: 5.3 g/dL — ABNORMAL LOW (ref 6.5–8.1)

## 2021-01-08 LAB — KAPPA/LAMBDA LIGHT CHAINS
Kappa free light chain: 164.9 mg/L — ABNORMAL HIGH (ref 3.3–19.4)
Kappa, lambda light chain ratio: 1.2 (ref 0.26–1.65)
Lambda free light chains: 137.4 mg/L — ABNORMAL HIGH (ref 5.7–26.3)

## 2021-01-08 LAB — PHOSPHORUS: Phosphorus: 5.6 mg/dL — ABNORMAL HIGH (ref 2.5–4.6)

## 2021-01-08 LAB — GLUCOSE, CAPILLARY
Glucose-Capillary: 198 mg/dL — ABNORMAL HIGH (ref 70–99)
Glucose-Capillary: 195 mg/dL — ABNORMAL HIGH (ref 70–99)
Glucose-Capillary: 215 mg/dL — ABNORMAL HIGH (ref 70–99)
Glucose-Capillary: 172 mg/dL — ABNORMAL HIGH (ref 70–99)

## 2021-01-08 LAB — MAGNESIUM: Magnesium: 2.2 mg/dL (ref 1.7–2.4)

## 2021-01-08 LAB — C4 COMPLEMENT: Complement C4, Body Fluid: 23 mg/dL (ref 12–38)

## 2021-01-08 LAB — WOUND CULTURE
MICRO NUMBER:: 11531433
SPECIMEN QUALITY:: ADEQUATE

## 2021-01-08 LAB — C3 COMPLEMENT: C3 Complement: 146 mg/dL (ref 82–167)

## 2021-01-08 LAB — HOUSE ACCOUNT TRACKING

## 2021-01-08 LAB — SURGICAL PATHOLOGY

## 2021-01-08 LAB — GAMMA GT: GGT: 277 U/L — ABNORMAL HIGH (ref 7–50)

## 2021-01-08 MED ORDER — TAMSULOSIN HCL 0.4 MG PO CAPS
0.4000 mg | ORAL_CAPSULE | Freq: Every day | ORAL | Status: DC
  Administered 2021-01-08 – 2021-01-16 (×8): 0.4 mg via ORAL
  Filled 2021-01-08 (×8): qty 1

## 2021-01-08 NOTE — Progress Notes (Signed)
Patient ID: Fernando Anthony, male   DOB: [birth year], 64 y.o.   MRN: [id number] [facility] Progress Note   Assessment/ Plan:   1.  Acute kidney injury: Nonoliguric and suspected to have hemodynamically mediated ATN.  The rate of rise of his creatinine appears to be smaller and we may be reaching the plateau phase-he does not have any acute electrolyte abnormality, uremic signs or symptoms or significant volume overload to indicate the need for dialysis.  I will start him on tamsulosin for his bladder outlet symptoms and to limit any risk for retention. 2.  Acute osteomyelitis of the right foot: Status post surgical debridement earlier by podiatry with implantation of antibiotic beads-on systemic antibiotics with clinical improvement 3.  Anemia: Likely secondary to chronic illness including his recent chronic infection/osteomyelitis.  Low iron saturation however, infection precluding intravenous iron administration.  Continue to monitor hemoglobin/hematocrit trend. 4.  Hypertension: Intermittently elevated blood pressures noted and currently his diuretic/ACE inhibitor are on hold; continue atenolol, clonidine and amlodipine and monitor the impact of starting tamsulosin 5.  Diabetes mellitus: Insulin-dependent and poorly controlled at baseline-management per primary service. Subjective:   Reports some urinary hesitancy overnight-sensation that he needs to void even after he completes voiding.  Denies any dysuria, hematuria, flank pain, fever or chills.   Objective:   BP (!) 153/81 (BP Location: Right Arm)   Pulse 62   Temp 97.6 F (36.4 C) (Oral)   Resp 18   Ht '6\' 2"'$  (1.88 m)   Wt 101.9 kg   SpO2 98%   BMI 28.84 kg/m   Intake/Output Summary (Last 24 hours) at 01/08/2021 1307 Last data filed at 01/08/2021 1000 Gross per 24 hour  Intake 2762.53 ml  Output 1200 ml  Net 1562.53 ml   Weight change:   Physical Exam: Gen: Comfortably sitting in bed, wife at bedside CVS: Pulse  regular rhythm, normal rate, S1 and S2 normal Resp: Clear to auscultation bilaterally, no rales/rhonchi Abd: Soft, obese, nontender, bowel sounds normal Ext: 1-2+ bilateral pitting lower extremity edema with right foot in dressing  Imaging: DG Foot Complete Right  Result Date: 01/06/2021 CLINICAL DATA:  Postop. Incision and drainage with bone biopsy and placement of antibiotic beads. EXAM: RIGHT FOOT COMPLETE - 3+ VIEW COMPARISON:  Radiograph and MRI 01/04/2021 FINDINGS: Interval placement of antibiotic beads subjacent to the calcaneus with overlying skin staples. Patient has prior transmetatarsal amputation with smooth resection margin. IMPRESSION: Interval placement of antibiotic beads subjacent to the calcaneus. Electronically Signed   By: Keith Rake M.D.   On: 01/06/2021 21:57   US ABDOMEN LIMITED RUQ (LIVER/GB)  Result Date: 01/07/2021 CLINICAL DATA:  Elevated liver enzymes EXAM: ULTRASOUND ABDOMEN LIMITED RIGHT UPPER QUADRANT COMPARISON:  Abdominal ultrasound December 04, 2018 FINDINGS: Gallbladder: Gallbladder is somewhat contracted. Gallbladder wall is borderline thickened without gallbladder wall edema. No gallstones are evident. No pericholecystic fluid. No sonographic Murphy sign noted by sonographer. Common bile duct: Diameter: 3 mm. No intrahepatic or extrahepatic biliary duct dilatation. Liver: No focal lesion identified. Within normal limits in parenchymal echogenicity. Portal vein is patent on color Doppler imaging with normal direction of blood flow towards the liver. Other: Mild ascites noted.  There is a right pleural effusion. IMPRESSION: 1. Gallbladder appears contracted with gallbladder wall borderline thickened. There is ascites which could account for gallbladder wall thickening. No gallstones evident. Note that acalculus cholecystitis potentially could present in this manner. This finding may warrant nuclear medicine hepatobiliary gene study to assess for cystic  duct  patency. 2.  Mild ascites. 3.  Right pleural effusion noted. Electronically Signed   By: Lowella Grip III M.D.   On: 01/07/2021 19:59    Labs: BMET Recent Labs  Lab 01/04/21 1604 01/05/21 0338 01/06/21 0330 01/07/21 0325 01/08/21 0309  NA 129* 128* 131* 133* 134*  K 3.4* 3.2* 3.5 3.6 3.5  CL 91* 94* 99 101 105  CO2 '24 23 22 '$ 20* 19*  GLUCOSE 239* 127* 133* 229* 188*  BUN 56* 56* 66* 65* 73*  CREATININE 3.94* 4.05* 4.52* 5.27* 5.40*  CALCIUM 7.9* 7.2* 7.4* 7.5* 7.3*  PHOS  --   --   --  5.4* 5.6*   CBC Recent Labs  Lab 01/04/21 1604 01/05/21 0338 01/06/21 0330 01/07/21 0325 01/08/21 0309  WBC 25.3* 15.3* 12.9* 12.1* 15.8*  NEUTROABS 23.3*  --   --  10.9* 12.4*  HGB 9.9* 8.1* 9.5* 9.4* 9.3*  HCT 30.6* 24.7* 28.9* 28.2* 27.6*  MCV 96.2 93.9 93.8 92.2 93.2  PLT 240 196 224 202 200    Medications:    . amLODipine  10 mg Oral Daily  . aspirin EC  81 mg Oral QHS  . atenolol  50 mg Oral Daily  . cloNIDine  0.1 mg Oral BID  . hydrALAZINE  25 mg Oral BID  . insulin aspart  0-15 Units Subcutaneous TID WC  . insulin aspart  0-5 Units Subcutaneous QHS  . insulin aspart  4 Units Subcutaneous TID WC  . insulin glargine  35 Units Subcutaneous QHS  . omega-3 acid ethyl esters  1 g Oral Daily  . pregabalin  50 mg Oral TID   Elmarie Shiley, MD 01/08/2021, 1:07 PM

## 2021-01-08 NOTE — Progress Notes (Signed)
Pharmacy Antibiotic Note  Fernando Anthony is a 65 y.o. male admitted on 01/04/2021 with osteomyelitis.  Pharmacy has been consulted for daptomycin dosing (over vanc d/t AKI). Pt has also been started on ceftriaxone. - Cxt: Citrobacter, re-incubated - Afebrile, WBC 15.8 - SCr increased to 5.4  Plan: Daptomycin 800 mg IV q48h   Ceftriaxone 2 gr IV q24h  Weekly CK labs ordered   Height: '6\' 2"'$  (188 cm) Weight: 101.9 kg (224 lb 10.4 oz) IBW/kg (Calculated) : 82.2  Temp (24hrs), Avg:97.7 F (36.5 C), Min:97.4 F (36.3 C), Max:98.1 F (36.7 C)  Recent Labs  Lab 01/04/21 1604 01/04/21 1802 01/04/21 2015 01/04/21 2253 01/05/21 0338 01/06/21 0330 01/07/21 0325 01/08/21 0309  WBC 25.3*  --   --   --  15.3* 12.9* 12.1* 15.8*  CREATININE 3.94*  --   --   --  4.05* 4.52* 5.27* 5.40*  LATICACIDVEN 2.6* 3.0* 1.4 1.7  --   --   --   --   VANCORANDOM  --   --   --   --   --  10  --   --     Estimated Creatinine Clearance: 17.8 mL/min (A) (by C-G formula based on SCr of 5.4 mg/dL (H)).    Allergies  Allergen Reactions  . Vicodin [Hydrocodone-Acetaminophen] Itching    Antimicrobials this admission: 2/14 vancomycin x1  2/14 ceftiaxone >>  2/15 daptomycin >>   Dose adjustments this admission:   Microbiology results: 2/14 BCx: ngtd 2/14 R foot cx: abun GPC & GNR, mod GPR . Few Citrobacter koseri 2/16 Abscess (rt heel): rare GPC   Thank you for allowing pharmacy to be a part of this patient's care.  Gretta Arab PharmD, BCPS Clinical Pharmacist [facility] main pharmacy [phone] 01/08/2021 7:46 AM

## 2021-01-08 NOTE — Progress Notes (Signed)
PROGRESS NOTE  Fernando Anthony [id number] DOB: 04-13-1956   PCP: Curly Rim, MD  Patient is from: Home.  DOA: 01/04/2021 LOS: 4  Chief complaints: Right foot ulcer  Brief Narrative / Interim history: 65 year old male with PMH of IDDM-2, right foot osteo s/p TMT in 2020, HLD and HTN sent to ED by podiatry, Dr. Amalia Hailey for right heel abscess and drainage.  MRI right foot concerning for calcaneal early osteomyelitis.  Inflammatory markers elevated.  On IV daptomycin and Rocephin.  Patient underwent incision and drainage of his right foot infection on 01/06/2021.  Renal function continued to rise despite IV fluid hydration.  Nephrology consulted.  Subjective: Seen and examined earlier this morning.  No major events overnight of this morning.  Pain fairly controlled.  Some urinary hesitancy this morning but resolved.  Denies chest pain, dyspnea or GI symptoms.  Objective: Vitals:   01/07/21 1630 01/07/21 2119 01/08/21 0139 01/08/21 0500  BP: (!) 145/83 138/83 (!) 148/78 (!) 153/81  Pulse: 67 62 62 62  Resp: '18 18 18 18  '$ Temp: (!) 97.5 F (36.4 C) 97.8 F (36.6 C) 98.1 F (36.7 C) 97.6 F (36.4 C)  TempSrc: Oral Oral Oral Oral  SpO2: 96% 97% 91% 98%  Weight:      Height:        Intake/Output Summary (Last 24 hours) at 01/08/2021 1219 Last data filed at 01/08/2021 1000 Gross per 24 hour  Intake 2762.53 ml  Output 1200 ml  Net 1562.53 ml   Filed Weights   01/04/21 1741 01/04/21 2244 01/06/21 1813  Weight: 97.5 kg 101.9 kg 101.9 kg    Examination:  GENERAL: No apparent distress.  Nontoxic. HEENT: MMM.  Vision and hearing grossly intact.  NECK: Supple.  No apparent JVD.  RESP: On RA.  No IWOB.  Fair aeration bilaterally. CVS:  RRR. Heart sounds normal.  ABD/GI/GU: BS+. Abd soft, NTND.  MSK/EXT:  Moves extremities. No apparent deformity. S/p TMA of right foot.  SKIN: Dressing and Ace wrap over right foot DCI. NEURO: Awake, alert and oriented appropriately.  No  apparent focal neuro deficit. PSYCH: Calm. Normal affect.  Procedures:  None  Microbiology summarized: [id number] and influenza PCR nonreactive. MRSA PCR screen nonreactive. Blood cultures NGTD. Superficial wound Gram stain with GPC, GNR and GPR.  Culture with pansensitive Citrobacter koseri Surgical/deep wound gram stain with rare GPC's.  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 Sed rate 129, CRP 19.3. Cultures as above.  Leukocytosis improving. -Continue daptomycin and Rocephin -Follow cultures and surgical pathology  AKI/azotemia: Likely a mix of prerenal and ATN.  Was on multiple nephrotoxic meds including NSAIDs prior to arrival.  FeNa 0.4% Renal US with medical renal disease, bladder wall thickening.  UA with small Hgb and 100 protein.  Cr seems to be plateauing. Recent Labs    01/04/21 1604 01/05/21 0338 01/06/21 0330 01/07/21 0325 01/08/21 0309  BUN 56* 56* 66* 65* 73*  CREATININE 3.94* 4.05* 4.52* 5.27* 5.40*  -Continue IV fluids -Avoid nephrotoxic meds. -Complement levels within normal -Follow labs for plasma dyscrasias.   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 Recent Labs  Lab 01/07/21 1117 01/07/21 1631 01/07/21 2121 01/08/21 0715 01/08/21 1117  GLUCAP 337* 362* 160* 198* 195*  -Continue Lantus 35 units nightly -Continue NovoLog AC at 4 units -Continue SSI moderate with nightly coverage  Essential HTN: SBP slightly elevated.  Could be driven  by IVF and pain. -Continue amlodipine, atenolol, clonidine and increased dose of hydralazine -Continue holding Lasix, HCTZ, lisinopril due to AKI  Elevated LFTs/ALP/total bili: pattern suggests EtOH use rhabdo but not a drinker.  CK within normal.  Crestor might contribute.  Acute hepatitis panel without significant finding.  RUQ ultrasound could not exclude acalculous cholecystitis.   LFT improving.  Patient has no GI symptoms at all. -Continue holding Crestor. -Continue trending -May consider HIDA scan if worse.  Normocytic anemia: H&H relatively stable.  Anemia panel suggested some iron deficiency. Recent Labs    01/04/21 1604 01/05/21 0338 01/06/21 0330 01/07/21 0325 01/08/21 0309  HGB 9.9* 8.1* 9.5* 9.4* 9.3*  -Continue monitoring -May benefit from IV iron down the road.   Hyponatremia: Likely due to AKI.  Improved. -Continue monitoring  Hyperphosphatemia: Likely due to renal failure. -Continue monitoring  HLD -Continue holding Crestor given elevated LFT  Hypokalemia: Resolved.   Body mass index is 28.84 kg/m.         DVT prophylaxis:  SCDs Start: 01/06/21 2227 SCDs Start: 01/04/21 1945  Code Status: Full code Family Communication: Updated patient's wife at bedside. Level of care: Med-Surg Status is: Inpatient  Remains inpatient appropriate because:Ongoing active pain requiring inpatient pain management, Ongoing diagnostic testing needed not appropriate for outpatient work up, IV treatments appropriate due to intensity of illness or inability to take PO and Inpatient level of care appropriate due to severity of illness   Dispo: The patient is from: Home              Anticipated d/c is to: Home              Anticipated d/c date is: > 3 days              Patient currently is not medically stable to d/c.   Difficult to place patient No       Consultants:  Podiatry Nephrology   Sch Meds:  Scheduled Meds: . amLODipine  10 mg Oral Daily  . aspirin EC  81 mg Oral QHS  . atenolol  50 mg Oral Daily  . cloNIDine  0.1 mg Oral BID  . hydrALAZINE  25 mg Oral BID  . insulin aspart  0-15 Units Subcutaneous TID WC  . insulin aspart  0-5 Units Subcutaneous QHS  . insulin aspart  4 Units Subcutaneous TID WC  . insulin glargine  35 Units Subcutaneous QHS  . omega-3 acid ethyl esters  1 g Oral Daily  . pregabalin  50 mg Oral TID    Continuous Infusions: . sodium chloride 100 mL/hr at 01/08/21 0735  . cefTRIAXone (ROCEPHIN)  IV 2 g (01/07/21 2317)  . DAPTOmycin (CUBICIN)  IV 800 mg (01/07/21 1944)   PRN Meds:.acetaminophen, fentaNYL (SUBLIMAZE) injection, morphine injection, ondansetron (ZOFRAN) IV, oxyCODONE, polyethylene glycol, promethazine, senna-docusate, sodium chloride flush  Antimicrobials: Anti-infectives (From admission, onward)   Start     Dose/Rate Route Frequency Ordered Stop   01/07/21 2000  DAPTOmycin (CUBICIN) 800 mg in sodium chloride 0.9 % IVPB        800 mg 232 mL/hr over 30 Minutes Intravenous Every 48 hours 01/05/21 0809     01/06/21 2040  tobramycin (NEBCIN) powder  Status:  Discontinued          As needed 01/06/21 2110 01/06/21 2212   01/06/21 2040  vancomycin (VANCOCIN) powder  Status:  Discontinued          As needed 01/06/21 2111 01/06/21 2212  01/05/21 1000  DAPTOmycin (CUBICIN) 800 mg in sodium chloride 0.9 % IVPB        800 mg 232 mL/hr over 30 Minutes Intravenous  Once 01/05/21 0809 01/05/21 1139   01/05/21 0900  vancomycin (VANCOREADY) IVPB 1250 mg/250 mL  Status:  Discontinued        1,250 mg 166.7 mL/hr over 90 Minutes Intravenous  Once 01/05/21 0741 01/05/21 0748   01/05/21 0741  vancomycin variable dose per unstable renal function (pharmacist dosing)  Status:  Discontinued         Does not apply See admin instructions 01/05/21 0741 01/05/21 0748   01/04/21 2000  cefTRIAXone (ROCEPHIN) 2 g in sodium chloride 0.9 % 100 mL IVPB        2 g 200 mL/hr over 30 Minutes Intravenous Every 24 hours 01/04/21 1944     01/04/21 1700  vancomycin (VANCOCIN) IVPB 1000 mg/200 mL premix        1,000 mg 200 mL/hr over 60 Minutes Intravenous  Once 01/04/21 1650 01/04/21 1900       I have personally reviewed the following labs and images: CBC: Recent Labs  Lab 01/04/21 1604 01/05/21 0338 01/06/21 0330 01/07/21 0325 01/08/21 0309  WBC 25.3* 15.3* 12.9* 12.1* 15.8*  NEUTROABS 23.3*   --   --  10.9* 12.4*  HGB 9.9* 8.1* 9.5* 9.4* 9.3*  HCT 30.6* 24.7* 28.9* 28.2* 27.6*  MCV 96.2 93.9 93.8 92.2 93.2  PLT 240 196 224 202 200   BMP &GFR Recent Labs  Lab 01/04/21 1604 01/05/21 0338 01/05/21 0355 01/06/21 0330 01/07/21 0325 01/08/21 0309  NA 129* 128*  --  131* 133* 134*  K 3.4* 3.2*  --  3.5 3.6 3.5  CL 91* 94*  --  99 101 105  CO2 24 23  --  22 20* 19*  GLUCOSE 239* 127*  --  133* 229* 188*  BUN 56* 56*  --  66* 65* 73*  CREATININE 3.94* 4.05*  --  4.52* 5.27* 5.40*  CALCIUM 7.9* 7.2*  --  7.4* 7.5* 7.3*  MG  --   --  1.9  --  2.0 2.2  PHOS  --   --   --   --  5.4* 5.6*   Estimated Creatinine Clearance: 17.8 mL/min (A) (by C-G formula based on SCr of 5.4 mg/dL (H)). Liver & Pancreas: Recent Labs  Lab 01/04/21 1604 01/05/21 0355 01/06/21 0330 01/07/21 0325 01/08/21 0309  AST 218* 149* 243* 212* 167*  ALT 116* 92* 133* 143* 136*  ALKPHOS 409* 370* 612* 682* 680*  BILITOT 2.1* 1.6* 2.8* 3.2* 2.3*  PROT 6.4* 5.3* 6.0* 5.8* 5.3*  ALBUMIN 2.3* 1.8* 2.0* 1.7* 1.7*   No results for input(s): LIPASE, AMYLASE in the last 168 hours. No results for input(s): AMMONIA in the last 168 hours. Diabetic: No results for input(s): HGBA1C in the last 72 hours. Recent Labs  Lab 01/07/21 1117 01/07/21 1631 01/07/21 2121 01/08/21 0715 01/08/21 1117  GLUCAP 337* 362* 160* 198* 195*   Cardiac Enzymes: Recent Labs  Lab 01/06/21 0330  CKTOTAL 65   No results for input(s): PROBNP in the last 8760 hours. Coagulation Profile: Recent Labs  Lab 01/04/21 1649  INR 1.2   Thyroid Function Tests: No results for input(s): TSH, T4TOTAL, FREET4, T3FREE, THYROIDAB in the last 72 hours. Lipid Profile: No results for input(s): CHOL, HDL, LDLCALC, TRIG, CHOLHDL, LDLDIRECT in the last 72 hours. Anemia Panel: No results for input(s): VITAMINB12, FOLATE, FERRITIN, TIBC, IRON,  RETICCTPCT in the last 72 hours. Urine analysis:    Component Value Date/Time   COLORURINE  YELLOW 01/07/2021 [place] 01/07/2021 1209   LABSPEC 1.009 01/07/2021 1209   PHURINE 6.0 01/07/2021 1209   GLUCOSEU >=500 (A) 01/07/2021 1209   HGBUR SMALL (A) 01/07/2021 1209   BILIRUBINUR NEGATIVE 01/07/2021 1209   KETONESUR 5 (A) 01/07/2021 1209   PROTEINUR 100 (A) 01/07/2021 1209   NITRITE NEGATIVE 01/07/2021 1209   LEUKOCYTESUR NEGATIVE 01/07/2021 1209   Sepsis Labs: Invalid input(s): PROCALCITONIN, [place]  Microbiology: Recent Results (from the past 240 hour(s))  WOUND CULTURE     Status: Abnormal   Collection Time: 01/04/21 12:13 PM  Result Value Ref Range Status   MICRO NUMBER: [id number]  Final   SPECIMEN QUALITY: Adequate  Final   SOURCE: WOUND (SITE NOT SPECIFIED)  Final   STATUS: FINAL  Final   GRAM STAIN:   Final    Rare Polymorphonuclear leukocytes Few epithelial cells Many Gram positive cocci in pairs Many Gram positive cocci in clusters Many Gram negative bacilli   ISOLATE 1: Group G Streptococcus (A)  Final    Comment: Heavy growth of Group G Streptococcus Beta-hemolytic streptococci are predictably susceptible to Penicillin and other beta-lactams. Susceptibility testing not routinely performed. Please contact the laboratory within 3 days if susceptibility testing is  desired.   Aerobic Culture w Gram Stain (superficial specimen)     Status: None (Preliminary result)   Collection Time: 01/04/21  5:19 PM   Specimen: Foot; Wound  Result Value Ref Range Status   Specimen Description   Final    FOOT RIGHT Performed at [facility] [address], [place], [place] [address]    Special Requests   Final    NONE Performed at [facility], [place] [address], [place], [place] [address]    Gram Stain   Final    MODERATE WBC PRESENT, PREDOMINANTLY PMN ABUNDANT GRAM POSITIVE COCCI ABUNDANT GRAM NEGATIVE RODS MODERATE GRAM POSITIVE RODS    Culture   Final    FEW CITROBACTER KOSERI CULTURE REINCUBATED FOR  BETTER GROWTH Performed at [facility], [place] [address], [place], [place] [address]    Report Status PENDING  Incomplete   Organism ID, Bacteria CITROBACTER KOSERI  Final      Susceptibility   Citrobacter koseri - MIC*    CEFAZOLIN <=4 SENSITIVE Sensitive     CEFEPIME <=0.12 SENSITIVE Sensitive     CEFTAZIDIME <=1 SENSITIVE Sensitive     CEFTRIAXONE <=0.25 SENSITIVE Sensitive     CIPROFLOXACIN <=0.25 SENSITIVE Sensitive     GENTAMICIN <=1 SENSITIVE Sensitive     IMIPENEM <=0.25 SENSITIVE Sensitive     TRIMETH/SULFA <=20 SENSITIVE Sensitive     PIP/TAZO 8 SENSITIVE Sensitive     * FEW CITROBACTER KOSERI  Culture, blood (routine x 2)     Status: None (Preliminary result)   Collection Time: 01/04/21  5:23 PM   Specimen: BLOOD  Result Value Ref Range Status   Specimen Description   Final    BLOOD LEFT ANTECUBITAL Performed at [facility] [address] [place], [place] [address]    Special Requests   Final    BOTTLES DRAWN AEROBIC AND ANAEROBIC Blood Culture results may not be optimal due to an inadequate volume of blood received in culture bottles Performed at [facility] [address], [place], [place] [address]    Culture   Final  NO GROWTH 3 DAYS Performed at [facility], [place] [address], [place], [place] [address]    Report Status PENDING  Incomplete  Culture, blood (routine x 2)     Status: None (Preliminary result)   Collection Time: 01/04/21  6:04 PM   Specimen: BLOOD  Result Value Ref Range Status   Specimen Description   Final    BLOOD RIGHT ANTECUBITAL Performed at [facility] [address], [place], [place] [address]    Special Requests   Final    BOTTLES DRAWN AEROBIC AND ANAEROBIC Blood Culture adequate volume Performed at [facility] [address] [place], [place] [address]    Culture   Final    NO GROWTH 3 DAYS Performed at [facility], [place]  [address], [place], [place] [address]    Report Status PENDING  Incomplete  Resp Panel by RT-PCR (Flu A&B, Covid) Nasopharyngeal Swab     Status: None   Collection Time: 01/04/21  6:05 PM   Specimen: Nasopharyngeal Swab; Nasopharyngeal(NP) swabs in vial transport medium  Result Value Ref Range Status   SARS Coronavirus 2 by RT PCR NEGATIVE NEGATIVE Final    Comment: (NOTE) SARS-CoV-2 target nucleic acids are NOT DETECTED.  The SARS-CoV-2 RNA is generally detectable in upper respiratory specimens during the acute phase of infection. The lowest concentration of SARS-CoV-2 viral copies this assay can detect is 138 copies/mL. A negative result does not preclude SARS-Cov-2 infection and should not be used as the sole basis for treatment or other patient management decisions. A negative result may occur with  improper specimen collection/handling, submission of specimen other than nasopharyngeal swab, presence of viral mutation(s) within the areas targeted by this assay, and inadequate number of viral copies(<138 copies/mL). A negative result must be combined with clinical observations, patient history, and epidemiological information. The expected result is Negative.  Fact Sheet for Patients:  EntrepreneurPulse.com.au  Fact Sheet for Healthcare Providers:  IncredibleEmployment.be  This test is no t yet approved or cleared by the [place] FDA and  has been authorized for detection and/or diagnosis of SARS-CoV-2 by FDA under an Emergency Use Authorization (EUA). This EUA will remain  in effect (meaning this test can be used) for the duration of the COVID-19 declaration under Section 564(b)(1) of the Act, 21 U.S.C.section 360bbb-3(b)(1), unless the authorization is terminated  or revoked sooner.       Influenza A by PCR NEGATIVE NEGATIVE Final   Influenza B by PCR NEGATIVE NEGATIVE Final    Comment: (NOTE) The Xpert Xpress SARS-CoV-2/FLU/RSV plus  assay is intended as an aid in the diagnosis of influenza from Nasopharyngeal swab specimens and should not be used as a sole basis for treatment. Nasal washings and aspirates are unacceptable for Xpert Xpress SARS-CoV-2/FLU/RSV testing.  Fact Sheet for Patients: EntrepreneurPulse.com.au  Fact Sheet for Healthcare Providers: IncredibleEmployment.be  This test is not yet approved or cleared by the [place] FDA and has been authorized for detection and/or diagnosis of SARS-CoV-2 by FDA under an Emergency Use Authorization (EUA). This EUA will remain in effect (meaning this test can be used) for the duration of the COVID-19 declaration under Section 564(b)(1) of the Act, 21 U.S.C. section 360bbb-3(b)(1), unless the authorization is terminated or revoked.  Performed at [facility], [place] [address], [place], [place] [address]   Surgical PCR screen     Status: None   Collection Time: 01/04/21 10:41 PM   Specimen: Nasal Mucosa;  Nasal Swab  Result Value Ref Range Status   MRSA, PCR NEGATIVE NEGATIVE Final   Staphylococcus aureus NEGATIVE NEGATIVE Final    Comment: (NOTE) The Xpert SA Assay (FDA approved for NASAL specimens in patients 63 years of age and older), is one component of a comprehensive surveillance program. It is not intended to diagnose infection nor to guide or monitor treatment. Performed at [facility], [place] [address], [place], [place] [address]   Aerobic/Anaerobic Culture w Gram Stain (surgical/deep wound)     Status: None (Preliminary result)   Collection Time: 01/06/21  8:46 PM   Specimen: Abscess  Result Value Ref Range Status   Specimen Description   Final    ABSCESS RIGHT HEEL Performed at [facility] [address] [place], [place] [address]    Special Requests   Final    NONE Performed at [facility], [place] [address], [place],  [place] [address]    Gram Stain NO WBC SEEN RARE GRAM POSITIVE COCCI   Final   Culture   Final    NO GROWTH 1 DAY Performed at [facility], [place] [address], [place], [place] [address]    Report Status PENDING  Incomplete    Radiology Studies: US ABDOMEN LIMITED RUQ (LIVER/GB)  Result Date: 01/07/2021 CLINICAL DATA:  Elevated liver enzymes EXAM: ULTRASOUND ABDOMEN LIMITED RIGHT UPPER QUADRANT COMPARISON:  Abdominal ultrasound December 04, 2018 FINDINGS: Gallbladder: Gallbladder is somewhat contracted. Gallbladder wall is borderline thickened without gallbladder wall edema. No gallstones are evident. No pericholecystic fluid. No sonographic Murphy sign noted by sonographer. Common bile duct: Diameter: 3 mm. No intrahepatic or extrahepatic biliary duct dilatation. Liver: No focal lesion identified. Within normal limits in parenchymal echogenicity. Portal vein is patent on color Doppler imaging with normal direction of blood flow towards the liver. Other: Mild ascites noted.  There is a right pleural effusion. IMPRESSION: 1. Gallbladder appears contracted with gallbladder wall borderline thickened. There is ascites which could account for gallbladder wall thickening. No gallstones evident. Note that acalculus cholecystitis potentially could present in this manner. This finding may warrant nuclear medicine hepatobiliary gene study to assess for cystic duct patency. 2.  Mild ascites. 3.  Right pleural effusion noted. Electronically Signed   By: Lowella Grip III M.D.   On: 01/07/2021 19:59      Presten Joost T. [place]  If 7PM-7AM, please contact night-coverage [email or web address] 01/08/2021, 12:19 PM

## 2021-01-09 DIAGNOSIS — M86171 Other acute osteomyelitis, right ankle and foot: Secondary | ICD-10-CM | POA: Diagnosis not present

## 2021-01-09 DIAGNOSIS — E871 Hypo-osmolality and hyponatremia: Secondary | ICD-10-CM | POA: Diagnosis not present

## 2021-01-09 DIAGNOSIS — N179 Acute kidney failure, unspecified: Secondary | ICD-10-CM | POA: Diagnosis not present

## 2021-01-09 DIAGNOSIS — R945 Abnormal results of liver function studies: Secondary | ICD-10-CM | POA: Diagnosis not present

## 2021-01-09 LAB — RENAL FUNCTION PANEL
Albumin: 1.7 g/dL — ABNORMAL LOW (ref 3.5–5.0)
Anion gap: 10 (ref 5–15)
BUN: 67 mg/dL — ABNORMAL HIGH (ref 8–23)
CO2: 18 mmol/L — ABNORMAL LOW (ref 22–32)
Calcium: 7.6 mg/dL — ABNORMAL LOW (ref 8.9–10.3)
Chloride: 107 mmol/L (ref 98–111)
Creatinine, Ser: 5.7 mg/dL — ABNORMAL HIGH (ref 0.61–1.24)
GFR, Estimated: 10 mL/min — ABNORMAL LOW (ref >60)
Glucose, Bld: 175 mg/dL — ABNORMAL HIGH (ref 70–99)
Phosphorus: 5.6 mg/dL — ABNORMAL HIGH (ref 2.5–4.6)
Potassium: 3.7 mmol/L (ref 3.5–5.1)
Sodium: 135 mmol/L (ref 135–145)

## 2021-01-09 LAB — BLOOD CULTURE ID PANEL (REFLEXED) - BCID2

## 2021-01-09 LAB — CBC
HCT: 28.7 % — ABNORMAL LOW (ref 39.0–52.0)
Hemoglobin: 9.6 g/dL — ABNORMAL LOW (ref 13.0–17.0)
MCH: 30.8 pg (ref 26.0–34.0)
MCHC: 33.4 g/dL (ref 30.0–36.0)
MCV: 92 fL (ref 80.0–100.0)
Platelets: 217 10*3/uL (ref 150–400)
RBC: 3.12 MIL/uL — ABNORMAL LOW (ref 4.22–5.81)
RDW: 14 % (ref 11.5–15.5)
WBC: 12.8 10*3/uL — ABNORMAL HIGH (ref 4.0–10.5)
nRBC: 0 % (ref 0.0–0.2)

## 2021-01-09 LAB — AEROBIC CULTURE W GRAM STAIN (SUPERFICIAL SPECIMEN)

## 2021-01-09 LAB — GLUCOSE, CAPILLARY
Glucose-Capillary: 152 mg/dL — ABNORMAL HIGH (ref 70–99)
Glucose-Capillary: 93 mg/dL (ref 70–99)
Glucose-Capillary: 111 mg/dL — ABNORMAL HIGH (ref 70–99)
Glucose-Capillary: 89 mg/dL (ref 70–99)

## 2021-01-09 LAB — CULTURE, BLOOD (ROUTINE X 2): Culture: NO GROWTH

## 2021-01-09 LAB — MAGNESIUM: Magnesium: 2 mg/dL (ref 1.7–2.4)

## 2021-01-09 MED ORDER — HYDRALAZINE HCL 50 MG PO TABS
50.0000 mg | ORAL_TABLET | Freq: Two times a day (BID) | ORAL | Status: DC
  Administered 2021-01-09 – 2021-01-16 (×15): 50 mg via ORAL
  Filled 2021-01-09 (×15): qty 1

## 2021-01-09 NOTE — Progress Notes (Signed)
Patient ID: Fernando Anthony, male   DOB: 06-Jul-1956, 65 y.o.   MRN: [id number] [facility] Progress Note   Assessment/ Plan:   1.  Acute kidney injury: Nonoliguric and suspected to have hemodynamically mediated ATN.  Creatinine slightly higher with improved BUN-possibly approaching ATN plateau phase-he does not have any acute electrolyte abnormality, uremic signs or symptoms or significant volume overload to indicate the need for dialysis.  Although listed as not having any urine output-he informs me that he was urinating in the toilet and did not collect any; he is now aware to do this. 2.  Acute osteomyelitis of the right foot: Status post surgical debridement earlier by podiatry with implantation of antibiotic beads-on systemic antibiotics.  He is afebrile and WBC count appears to be improving. 3.  Anemia: Likely secondary to chronic illness including his recent chronic infection/osteomyelitis.  Low iron saturation however, infection precluding intravenous iron administration.  Continue to monitor hemoglobin/hematocrit trend. 4.  Hypertension: Intermittently elevated blood pressures noted and currently his diuretic/ACE inhibitor are on hold; continue atenolol, clonidine and amlodipine and yesterday started on tamsulosin. 5.  Diabetes mellitus: Insulin-dependent and poorly controlled at baseline-management per primary service. Subjective:   Concerned about bilateral lower extremity edema and lack of renal recovery so far.   Objective:   BP (!) 166/85 (BP Location: Left Arm)   Pulse (!) 58   Temp 98.1 F (36.7 C) (Oral)   Resp 17   Ht '6\' 2"'$  (1.88 m)   Wt 101.9 kg   SpO2 99%   BMI 28.84 kg/m   Intake/Output Summary (Last 24 hours) at 01/09/2021 1055 Last data filed at 01/09/2021 0900 Gross per 24 hour  Intake 2544.2 ml  Output --  Net 2544.2 ml   Weight change:   Physical Exam: Gen: Comfortably sitting in bed, wife at bedside CVS: Pulse regular rhythm, normal rate, S1 and  S2 normal Resp: Clear to auscultation bilaterally, no rales/rhonchi Abd: Soft, obese, nontender, bowel sounds normal Ext: 1-2+ bilateral pitting lower extremity edema with right foot in dressing  Imaging: US ABDOMEN LIMITED RUQ (LIVER/GB)  Result Date: 01/07/2021 CLINICAL DATA:  Elevated liver enzymes EXAM: ULTRASOUND ABDOMEN LIMITED RIGHT UPPER QUADRANT COMPARISON:  Abdominal ultrasound December 04, 2018 FINDINGS: Gallbladder: Gallbladder is somewhat contracted. Gallbladder wall is borderline thickened without gallbladder wall edema. No gallstones are evident. No pericholecystic fluid. No sonographic Murphy sign noted by sonographer. Common bile duct: Diameter: 3 mm. No intrahepatic or extrahepatic biliary duct dilatation. Liver: No focal lesion identified. Within normal limits in parenchymal echogenicity. Portal vein is patent on color Doppler imaging with normal direction of blood flow towards the liver. Other: Mild ascites noted.  There is a right pleural effusion. IMPRESSION: 1. Gallbladder appears contracted with gallbladder wall borderline thickened. There is ascites which could account for gallbladder wall thickening. No gallstones evident. Note that acalculus cholecystitis potentially could present in this manner. This finding may warrant nuclear medicine hepatobiliary gene study to assess for cystic duct patency. 2.  Mild ascites. 3.  Right pleural effusion noted. Electronically Signed   By: Lowella Grip III M.D.   On: 01/07/2021 19:59    Labs: BMET Recent Labs  Lab 01/04/21 1604 01/05/21 0338 01/06/21 0330 01/07/21 0325 01/08/21 0309 01/09/21 0416  NA 129* 128* 131* 133* 134* 135  K 3.4* 3.2* 3.5 3.6 3.5 3.7  CL 91* 94* 99 101 105 107  CO2 '24 23 22 '$ 20* 19* 18*  GLUCOSE 239* 127* 133* 229* 188* 175*  BUN 56* 56* 66* 65* 73* 67*  CREATININE 3.94* 4.05* 4.52* 5.27* 5.40* 5.70*  CALCIUM 7.9* 7.2* 7.4* 7.5* 7.3* 7.6*  PHOS  --   --   --  5.4* 5.6* 5.6*   CBC Recent Labs   Lab 01/04/21 1604 01/05/21 0338 01/06/21 0330 01/07/21 0325 01/08/21 0309 01/09/21 0416  WBC 25.3*   < > 12.9* 12.1* 15.8* 12.8*  NEUTROABS 23.3*  --   --  10.9* 12.4*  --   HGB 9.9*   < > 9.5* 9.4* 9.3* 9.6*  HCT 30.6*   < > 28.9* 28.2* 27.6* 28.7*  MCV 96.2   < > 93.8 92.2 93.2 92.0  PLT 240   < > 224 202 200 217   < > = values in this interval not displayed.    Medications:    . amLODipine  10 mg Oral Daily  . aspirin EC  81 mg Oral QHS  . atenolol  50 mg Oral Daily  . cloNIDine  0.1 mg Oral BID  . hydrALAZINE  50 mg Oral BID  . insulin aspart  0-15 Units Subcutaneous TID WC  . insulin aspart  0-5 Units Subcutaneous QHS  . insulin aspart  4 Units Subcutaneous TID WC  . insulin glargine  35 Units Subcutaneous QHS  . omega-3 acid ethyl esters  1 g Oral Daily  . pregabalin  50 mg Oral TID  . tamsulosin  0.4 mg Oral QPC supper   Elmarie Shiley, MD 01/09/2021, 10:55 AM

## 2021-01-09 NOTE — Progress Notes (Addendum)
PROGRESS NOTE  Fernando Anthony [id number] DOB: [birth year]   PCP: Curly Rim, MD  Patient is from: Home.  DOA: 01/04/2021 LOS: 5  Chief complaints: Right foot ulcer  Brief Narrative / Interim history: 65 year old male with PMH of IDDM-2, right foot osteo s/p TMT in 2020, HLD and HTN sent to ED by podiatry, Dr. Amalia Hailey for right heel abscess and drainage.  MRI right foot concerning for calcaneal early osteomyelitis.  Inflammatory markers elevated.  Started on IV daptomycin and Rocephin.  Patient underwent incision and drainage of his right heel infection on 01/06/2021.  Pathology showed osteomyelitis left calcaneus bone.  Patient opted for amputation after talking to podiatry, Dr. Events.  Orthopedic surgery, Dr. Percell Miller consulted and recommended transfer to [facility]. Dr. Percell Miller to discuss with Dr. Sharol Given.   Renal function continued to rise despite IV fluid hydration.  Nephrology following.  Subjective: Seen and examined earlier this morning.  No major events overnight or this morning.  No complaints.  Pain fairly controlled.  Patient's wife concerned about fluid retention.  He has no cardiopulmonary symptoms.  Reports making good amount of urine but has been urinating in the toilet and urine output was not captured well.  Objective: Vitals:   01/08/21 0500 01/08/21 1359 01/08/21 2100 01/09/21 0600  BP: (!) 153/81 (!) 152/90 (!) 144/76 (!) 166/85  Pulse: 62 (!) 55 61 (!) 58  Resp: '18 17 12 17  '$ Temp: 97.6 F (36.4 C) 98.3 F (36.8 C) (!) 97.5 F (36.4 C) 98.1 F (36.7 C)  TempSrc: Oral Oral Oral Oral  SpO2: 98% 97% 95% 99%  Weight:      Height:        Intake/Output Summary (Last 24 hours) at 01/09/2021 1135 Last data filed at 01/09/2021 1133 Gross per 24 hour  Intake 2544.2 ml  Output 350 ml  Net 2194.2 ml   Filed Weights   01/04/21 1741 01/04/21 2244 01/06/21 1813  Weight: 97.5 kg 101.9 kg 101.9 kg    Examination:  GENERAL: No apparent distress.  Nontoxic. HEENT: MMM.   Vision and hearing grossly intact.  NECK: Supple.  No apparent JVD.  RESP: On RA.  No IWOB.  Fair aeration bilaterally. CVS:  RRR. Heart sounds normal.  ABD/GI/GU: BS+. Abd soft, NTND.  MSK/EXT:  Moves extremities. S/p TMA of right foot.  1+ edema bilaterally SKIN: Ace wrap and dressing over right foot DCI.  No erythema proximally. NEURO: Awake, alert and oriented appropriately.  No apparent focal neuro deficit. PSYCH: Calm. Normal affect.   Procedures:  2/16-incision and drainage with antibiotic bead placement of right heel by Dr. Amalia Hailey.  Microbiology summarized: COVID-19 and influenza PCR nonreactive. MRSA PCR screen nonreactive. Blood cultures NGTD. Superficial wound Gram stain with GPC, GNR and GPR.  Culture with pansensitive Citrobacter koseri Surgical/deep wound gram stain with rare GPC's.  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  Pathology confirmed osteomyelitis.  Sed rate 129, CRP 19.3. Cultures as above.  Leukocytosis improving. -S/p I&D with antibiotic bead placement on 2/16.  Pathology confirmed osteomyelitis -Patient opted for amputation after talking to podiatry.  Ortho, Dr. Percell Miller consulted and recommended transfer to [facility]. Dr. Percell Miller to discuss with Dr. Sharol Given.  -Daptomycin 2/15-2/18 -Ceftriaxone 2/15>>>  AKI/azotemia: Likely a mix of prerenal and ATN.  Was on multiple nephrotoxic meds including NSAIDs prior to arrival.  FeNa 0.4% Renal US with medical renal disease, bladder wall thickening.  UA with small Hgb and 100 protein.  C3 and C4 normal.  Urine kappa and lambda elevated but ratio within normal.  Creatinine rise is slowing. Recent Labs    01/04/21 1604 01/05/21 0338 01/06/21 0330 01/07/21 0325 01/08/21 0309 01/09/21 0416  BUN 56* 56* 66* 65* 73* 67*  CREATININE 3.94* 4.05* 4.52* 5.27* 5.40* 5.70*  -Avoid nephrotoxic meds. -Follow serum protein electrophoresis. 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 Recent Labs  Lab 01/08/21 1117 01/08/21 1616 01/08/21 2137 01/09/21 0723 01/09/21 1127  GLUCAP 195* 215* 172* 152* 93  -Continue Lantus 35 units nightly -Continue NovoLog AC at 4 units -Continue SSI moderate with nightly coverage  Essential HTN: SBP slightly elevated.  -Continue amlodipine, atenolol, clonidine and increased dose of hydralazine -Continue holding Lasix, HCTZ, lisinopril due to AKI -Tamsulosin added by nephrology 2/18.  Elevated LFTs/ALP/total bili: pattern suggests EtOH use rhabdo but not a drinker.  CK within normal.  Crestor might contribute.  Acute hepatitis panel without significant finding.  RUQ ultrasound could not exclude acalculous cholecystitis.  LFT improving.  Patient has no GI symptoms at all. -Continue holding Crestor. -Continue trending -May consider HIDA scan if worse.  Normocytic anemia: H&H relatively stable.  Anemia panel suggested some iron deficiency. Recent Labs    01/04/21 1604 01/05/21 0338 01/06/21 0330 01/07/21 0325 01/08/21 0309 01/09/21 0416  HGB 9.9* 8.1* 9.5* 9.4* 9.3* 9.6*  -Continue monitoring -May benefit from IV iron down the road.  Hyponatremia: Likely due to AKI.  Resolved. -Continue monitoring  Hyperphosphatemia: Likely due to renal failure. -Continue monitoring  HLD -Continue holding Crestor given elevated LFT  Hypokalemia: Resolved.   Body mass index is 28.84 kg/m.         DVT prophylaxis:  SCDs Start: 01/06/21 2227 SCDs Start: 01/04/21 1945  Code Status: Full code Family Communication: Updated patient's wife at bedside. Level of care: Med-Surg Status is: Inpatient  Remains inpatient appropriate because:Ongoing active pain requiring inpatient pain management, Ongoing diagnostic testing needed not appropriate for outpatient work up, IV treatments appropriate due to intensity of  illness or inability to take PO and Inpatient level of care appropriate due to severity of illness   Dispo: The patient is from: Home              Anticipated d/c is to: Home              Anticipated d/c date is: > 3 days              Patient currently is not medically stable to d/c.   Difficult to place patient No       Consultants:  Podiatry Nephrology Infectious disease   Sch Meds:  Scheduled Meds: . amLODipine  10 mg Oral Daily  . aspirin EC  81 mg Oral QHS  . atenolol  50 mg Oral Daily  . cloNIDine  0.1 mg Oral BID  . hydrALAZINE  50 mg Oral BID  . insulin aspart  0-15 Units Subcutaneous TID WC  . insulin aspart  0-5 Units Subcutaneous QHS  . insulin aspart  4 Units Subcutaneous TID WC  . insulin glargine  35 Units Subcutaneous QHS  . omega-3 acid ethyl esters  1 g Oral Daily  . pregabalin  50 mg Oral TID  . tamsulosin  0.4 mg Oral QPC supper   Continuous Infusions: . cefTRIAXone (ROCEPHIN)  IV Stopped (01/09/21 0848)   PRN Meds:.acetaminophen, fentaNYL (SUBLIMAZE) injection, morphine injection, ondansetron (ZOFRAN) IV,  oxyCODONE, polyethylene glycol, promethazine, senna-docusate, sodium chloride flush  Antimicrobials: Anti-infectives (From admission, onward)   Start     Dose/Rate Route Frequency Ordered Stop   01/07/21 2000  DAPTOmycin (CUBICIN) 800 mg in sodium chloride 0.9 % IVPB  Status:  Discontinued        800 mg 232 mL/hr over 30 Minutes Intravenous Every 48 hours 01/05/21 0809 01/08/21 1503   01/06/21 2040  tobramycin (NEBCIN) powder  Status:  Discontinued          As needed 01/06/21 2110 01/06/21 2212   01/06/21 2040  vancomycin (VANCOCIN) powder  Status:  Discontinued          As needed 01/06/21 2111 01/06/21 2212   01/05/21 1000  DAPTOmycin (CUBICIN) 800 mg in sodium chloride 0.9 % IVPB        800 mg 232 mL/hr over 30 Minutes Intravenous  Once 01/05/21 0809 01/05/21 1139   01/05/21 0900  vancomycin (VANCOREADY) IVPB 1250 mg/250 mL  Status:   Discontinued        1,250 mg 166.7 mL/hr over 90 Minutes Intravenous  Once 01/05/21 0741 01/05/21 0748   01/05/21 0741  vancomycin variable dose per unstable renal function (pharmacist dosing)  Status:  Discontinued         Does not apply See admin instructions 01/05/21 0741 01/05/21 0748   01/04/21 2000  cefTRIAXone (ROCEPHIN) 2 g in sodium chloride 0.9 % 100 mL IVPB        2 g 200 mL/hr over 30 Minutes Intravenous Every 24 hours 01/04/21 1944     01/04/21 1700  vancomycin (VANCOCIN) IVPB 1000 mg/200 mL premix        1,000 mg 200 mL/hr over 60 Minutes Intravenous  Once 01/04/21 1650 01/04/21 1900       I have personally reviewed the following labs and images: CBC: Recent Labs  Lab 01/04/21 1604 01/05/21 0338 01/06/21 0330 01/07/21 0325 01/08/21 0309 01/09/21 0416  WBC 25.3* 15.3* 12.9* 12.1* 15.8* 12.8*  NEUTROABS 23.3*  --   --  10.9* 12.4*  --   HGB 9.9* 8.1* 9.5* 9.4* 9.3* 9.6*  HCT 30.6* 24.7* 28.9* 28.2* 27.6* 28.7*  MCV 96.2 93.9 93.8 92.2 93.2 92.0  PLT 240 196 224 202 200 217   BMP &GFR Recent Labs  Lab 01/05/21 0338 01/05/21 0355 01/06/21 0330 01/07/21 0325 01/08/21 0309 01/09/21 0416  NA 128*  --  131* 133* 134* 135  K 3.2*  --  3.5 3.6 3.5 3.7  CL 94*  --  99 101 105 107  CO2 23  --  22 20* 19* 18*  GLUCOSE 127*  --  133* 229* 188* 175*  BUN 56*  --  66* 65* 73* 67*  CREATININE 4.05*  --  4.52* 5.27* 5.40* 5.70*  CALCIUM 7.2*  --  7.4* 7.5* 7.3* 7.6*  MG  --  1.9  --  2.0 2.2 2.0  PHOS  --   --   --  5.4* 5.6* 5.6*   Estimated Creatinine Clearance: 16.9 mL/min (A) (by C-G formula based on SCr of 5.7 mg/dL (H)). Liver & Pancreas: Recent Labs  Lab 01/04/21 1604 01/05/21 0355 01/06/21 0330 01/07/21 0325 01/08/21 0309 01/09/21 0416  AST 218* 149* 243* 212* 167*  --   ALT 116* 92* 133* 143* 136*  --   ALKPHOS 409* 370* 612* 682* 680*  --   BILITOT 2.1* 1.6* 2.8* 3.2* 2.3*  --   PROT 6.4* 5.3* 6.0* 5.8* 5.3*  --  ALBUMIN 2.3* 1.8* 2.0* 1.7*  1.7* 1.7*   No results for input(s): LIPASE, AMYLASE in the last 168 hours. No results for input(s): AMMONIA in the last 168 hours. Diabetic: No results for input(s): HGBA1C in the last 72 hours. Recent Labs  Lab 01/08/21 1117 01/08/21 1616 01/08/21 2137 01/09/21 0723 01/09/21 1127  GLUCAP 195* 215* 172* 152* 93   Cardiac Enzymes: Recent Labs  Lab 01/06/21 0330  CKTOTAL 65   No results for input(s): PROBNP in the last 8760 hours. Coagulation Profile: Recent Labs  Lab 01/04/21 1649  INR 1.2   Thyroid Function Tests: No results for input(s): TSH, T4TOTAL, FREET4, T3FREE, THYROIDAB in the last 72 hours. Lipid Profile: No results for input(s): CHOL, HDL, LDLCALC, TRIG, CHOLHDL, LDLDIRECT in the last 72 hours. Anemia Panel: No results for input(s): VITAMINB12, FOLATE, FERRITIN, TIBC, IRON, RETICCTPCT in the last 72 hours. Urine analysis:    Component Value Date/Time   COLORURINE YELLOW 01/07/2021 [place] 01/07/2021 1209   LABSPEC 1.009 01/07/2021 1209   PHURINE 6.0 01/07/2021 1209   GLUCOSEU >=500 (A) 01/07/2021 1209   HGBUR SMALL (A) 01/07/2021 1209   BILIRUBINUR NEGATIVE 01/07/2021 1209   KETONESUR 5 (A) 01/07/2021 1209   PROTEINUR 100 (A) 01/07/2021 1209   NITRITE NEGATIVE 01/07/2021 1209   LEUKOCYTESUR NEGATIVE 01/07/2021 1209   Sepsis Labs: Invalid input(s): PROCALCITONIN, [place]  Microbiology: Recent Results (from the past 240 hour(s))  WOUND CULTURE     Status: Abnormal   Collection Time: 01/04/21 12:13 PM  Result Value Ref Range Status   MICRO NUMBER: [id number]  Final   SPECIMEN QUALITY: Adequate  Final   SOURCE: WOUND (SITE NOT SPECIFIED)  Final   STATUS: FINAL  Final   GRAM STAIN:   Final    Rare Polymorphonuclear leukocytes Few epithelial cells Many Gram positive cocci in pairs Many Gram positive cocci in clusters Many Gram negative bacilli   ISOLATE 1: Group G Streptococcus (A)  Final    Comment: Heavy growth of Group G  Streptococcus Beta-hemolytic streptococci are predictably susceptible to Penicillin and other beta-lactams. Susceptibility testing not routinely performed. Please contact the laboratory within 3 days if susceptibility testing is  desired.   Aerobic Culture w Gram Stain (superficial specimen)     Status: None (Preliminary result)   Collection Time: 01/04/21  5:19 PM   Specimen: Foot; Wound  Result Value Ref Range Status   Specimen Description   Final    FOOT RIGHT Performed at [facility] [address] [place], [place] [address]    Special Requests   Final    NONE Performed at [facility], [place] [address] [place], [place] [address]    Gram Stain   Final    MODERATE WBC PRESENT, PREDOMINANTLY PMN ABUNDANT GRAM POSITIVE COCCI ABUNDANT GRAM NEGATIVE RODS MODERATE GRAM POSITIVE RODS    Culture   Final    FEW CITROBACTER KOSERI CULTURE REINCUBATED FOR BETTER GROWTH Performed at [facility], [place] [address], [place], [place] [address]    Report Status PENDING  Incomplete   Organism ID, Bacteria CITROBACTER KOSERI  Final      Susceptibility   Citrobacter koseri - MIC*    CEFAZOLIN <=4 SENSITIVE Sensitive     CEFEPIME <=0.12 SENSITIVE Sensitive     CEFTAZIDIME <=1 SENSITIVE Sensitive     CEFTRIAXONE <=0.25 SENSITIVE Sensitive     CIPROFLOXACIN <=0.25 SENSITIVE Sensitive     GENTAMICIN <=1 SENSITIVE Sensitive  IMIPENEM <=0.25 SENSITIVE Sensitive     TRIMETH/SULFA <=20 SENSITIVE Sensitive     PIP/TAZO 8 SENSITIVE Sensitive     * FEW CITROBACTER KOSERI  Culture, blood (routine x 2)     Status: None   Collection Time: 01/04/21  5:23 PM   Specimen: BLOOD  Result Value Ref Range Status   Specimen Description   Final    BLOOD LEFT ANTECUBITAL Performed at [facility] [address], [place], [place] [address]    Special Requests   Final    BOTTLES DRAWN AEROBIC AND ANAEROBIC Blood Culture results may not be  optimal due to an inadequate volume of blood received in culture bottles Performed at [facility] [address] [place], [place] [address]    Culture   Final    NO GROWTH 5 DAYS Performed at [facility], [place] [address] [place], [place] [address]    Report Status 01/09/2021 FINAL  Final  Culture, blood (routine x 2)     Status: None   Collection Time: 01/04/21  6:04 PM   Specimen: BLOOD  Result Value Ref Range Status   Specimen Description   Final    BLOOD RIGHT ANTECUBITAL Performed at [facility] [address] [place], [place] [address]    Special Requests   Final    BOTTLES DRAWN AEROBIC AND ANAEROBIC Blood Culture adequate volume Performed at [facility] [address], [place], [place] [address]    Culture   Final    NO GROWTH 5 DAYS Performed at [facility], [place] [address] [place], [place] [address]    Report Status 01/09/2021 FINAL  Final  Resp Panel by RT-PCR (Flu A&B, Covid) Nasopharyngeal Swab     Status: None   Collection Time: 01/04/21  6:05 PM   Specimen: Nasopharyngeal Swab; Nasopharyngeal(NP) swabs in vial transport medium  Result Value Ref Range Status   SARS Coronavirus 2 by RT PCR NEGATIVE NEGATIVE Final    Comment: (NOTE) SARS-CoV-2 target nucleic acids are NOT DETECTED.  The SARS-CoV-2 RNA is generally detectable in upper respiratory specimens during the acute phase of infection. The lowest concentration of SARS-CoV-2 viral copies this assay can detect is 138 copies/mL. A negative result does not preclude SARS-Cov-2 infection and should not be used as the sole basis for treatment or other patient management decisions. A negative result may occur with  improper specimen collection/handling, submission of specimen other than nasopharyngeal swab, presence of viral mutation(s) within the areas targeted by this assay, and inadequate number of viral copies(<138 copies/mL). A  negative result must be combined with clinical observations, patient history, and epidemiological information. The expected result is Negative.  Fact Sheet for Patients:  EntrepreneurPulse.com.au  Fact Sheet for Healthcare Providers:  IncredibleEmployment.be  This test is no t yet approved or cleared by the [place] FDA and  has been authorized for detection and/or diagnosis of SARS-CoV-2 by FDA under an Emergency Use Authorization (EUA). This EUA will remain  in effect (meaning this test can be used) for the duration of the COVID-19 declaration under Section 564(b)(1) of the Act, 21 U.S.C.section 360bbb-3(b)(1), unless the authorization is terminated  or revoked sooner.       Influenza A by PCR NEGATIVE NEGATIVE Final   Influenza B by PCR NEGATIVE NEGATIVE Final    Comment: (NOTE) The Xpert Xpress SARS-CoV-2/FLU/RSV plus assay is intended as an aid in the diagnosis of influenza from Nasopharyngeal swab  specimens and should not be used as a sole basis for treatment. Nasal washings and aspirates are unacceptable for Xpert Xpress SARS-CoV-2/FLU/RSV testing.  Fact Sheet for Patients: EntrepreneurPulse.com.au  Fact Sheet for Healthcare Providers: IncredibleEmployment.be  This test is not yet approved or cleared by the [place] FDA and has been authorized for detection and/or diagnosis of SARS-CoV-2 by FDA under an Emergency Use Authorization (EUA). This EUA will remain in effect (meaning this test can be used) for the duration of the COVID-19 declaration under Section 564(b)(1) of the Act, 21 U.S.C. section 360bbb-3(b)(1), unless the authorization is terminated or revoked.  Performed at [facility], [place] [address], [place], [place] [address]   Surgical PCR screen     Status: None   Collection Time: 01/04/21 10:41 PM   Specimen: Nasal Mucosa; Nasal Swab  Result Value Ref  Range Status   MRSA, PCR NEGATIVE NEGATIVE Final   Staphylococcus aureus NEGATIVE NEGATIVE Final    Comment: (NOTE) The Xpert SA Assay (FDA approved for NASAL specimens in patients 53 years of age and older), is one component of a comprehensive surveillance program. It is not intended to diagnose infection nor to guide or monitor treatment. Performed at [facility], [place] [address], [place], [place] [address]   Aerobic/Anaerobic Culture w Gram Stain (surgical/deep wound)     Status: None (Preliminary result)   Collection Time: 01/06/21  8:46 PM   Specimen: Abscess  Result Value Ref Range Status   Specimen Description   Final    ABSCESS RIGHT HEEL Performed at [facility] [address] [place], [place] [address]    Special Requests   Final    NONE Performed at [facility], [place] [address], [place], [place] [address]    Gram Stain NO WBC SEEN RARE GRAM POSITIVE COCCI   Final   Culture   Final    NO GROWTH 1 DAY Performed at [facility], [place] [address], [place], [place] [address]    Report Status PENDING  Incomplete    Radiology Studies: No results found.    Medha Pippen T. [place]  If 7PM-7AM, please contact night-coverage [email or web address] 01/09/2021, 11:35 AM

## 2021-01-09 NOTE — Progress Notes (Signed)
Report called to Caren Griffins, RN on [place] at [facility]. Report called to [facility]; [facility] en route to transfer patient to 5N room 22.

## 2021-01-09 NOTE — Plan of Care (Signed)
  Problem: Education: Goal: Knowledge of General Education information will improve Description Including pain rating scale, medication(s)/side effects and non-pharmacologic comfort measures Outcome: Progressing   

## 2021-01-09 NOTE — Progress Notes (Signed)
   PODIATRY PROGRESS NOTE CHRISHON FRERKING [id number] DOB:Dec 22, 1955 DOA:01/04/2021 YC:[id number], Kip A, MD  Brief narrative: Patient S/P incision and drainage with bone biopsy and placement of antibiotic beads right heel.  DOS: 01/06/2021.  Bone biopsy results positive for acute osteomyelitis RT calcaneus.   Assessment/plan of care: Osteomyelitis right Calcaneus. S/p I&D w/ abx beads RT heel -Bone cultures reviewed. Patient has been dealing with the foot for over 2 years now and already midfoot amputation. At this point the patient would like to proceed with below knee amputation rather and limb salvage. I agree that with would likely serve him best in order to return as fast as possible to a better quality of life.  -Dr. Cyndia Skeeters, [facility] contacted. He will consult orthopedics.  -Podiatry to sign off.   *Thank you for the consult. Please contact me directly with any questions or concerns. Cell [phone].    Edrick Kins, DPM [facility]  Dr. Edrick Kins, DPM    [address], [place] [address]                Office [phone]  Fax [phone]

## 2021-01-09 NOTE — Plan of Care (Signed)

## 2021-01-10 ENCOUNTER — Inpatient Hospital Stay (HOSPITAL_COMMUNITY): Payer: BC Managed Care – PPO

## 2021-01-10 ENCOUNTER — Other Ambulatory Visit (HOSPITAL_COMMUNITY): Payer: BC Managed Care – PPO

## 2021-01-10 DIAGNOSIS — R7881 Bacteremia: Secondary | ICD-10-CM

## 2021-01-10 DIAGNOSIS — Z01818 Encounter for other preprocedural examination: Secondary | ICD-10-CM | POA: Diagnosis not present

## 2021-01-10 DIAGNOSIS — M86171 Other acute osteomyelitis, right ankle and foot: Secondary | ICD-10-CM

## 2021-01-10 DIAGNOSIS — N179 Acute kidney failure, unspecified: Secondary | ICD-10-CM | POA: Diagnosis not present

## 2021-01-10 DIAGNOSIS — E1121 Type 2 diabetes mellitus with diabetic nephropathy: Secondary | ICD-10-CM | POA: Diagnosis not present

## 2021-01-10 DIAGNOSIS — R945 Abnormal results of liver function studies: Secondary | ICD-10-CM | POA: Diagnosis not present

## 2021-01-10 DIAGNOSIS — E43 Unspecified severe protein-calorie malnutrition: Secondary | ICD-10-CM

## 2021-01-10 LAB — ECHOCARDIOGRAM COMPLETE
AR max vel: 2.69 cm2
AV Area VTI: 2.98 cm2
AV Area mean vel: 2.88 cm2
AV Mean grad: 5 mmHg
AV Peak grad: 9.9 mmHg
Ao pk vel: 1.57 m/s
Area-P 1/2: 5.13 cm2
Height: 74 in
S' Lateral: 3.6 cm
Single Plane A4C EF: 55.7 %
Weight: 3594.38 oz

## 2021-01-10 LAB — HEMOGLOBIN AND HEMATOCRIT, BLOOD
HCT: 28.3 % — ABNORMAL LOW (ref 39.0–52.0)
Hemoglobin: 9.7 g/dL — ABNORMAL LOW (ref 13.0–17.0)

## 2021-01-10 LAB — RENAL FUNCTION PANEL
Albumin: 1.5 g/dL — ABNORMAL LOW (ref 3.5–5.0)
Anion gap: 12 (ref 5–15)
BUN: 62 mg/dL — ABNORMAL HIGH (ref 8–23)
CO2: 15 mmol/L — ABNORMAL LOW (ref 22–32)
Calcium: 7.9 mg/dL — ABNORMAL LOW (ref 8.9–10.3)
Chloride: 106 mmol/L (ref 98–111)
Creatinine, Ser: 5.77 mg/dL — ABNORMAL HIGH (ref 0.61–1.24)
GFR, Estimated: 10 mL/min — ABNORMAL LOW (ref >60)
Glucose, Bld: 143 mg/dL — ABNORMAL HIGH (ref 70–99)
Phosphorus: 5.8 mg/dL — ABNORMAL HIGH (ref 2.5–4.6)
Potassium: 4.3 mmol/L (ref 3.5–5.1)
Sodium: 133 mmol/L — ABNORMAL LOW (ref 135–145)

## 2021-01-10 LAB — GLUCOSE, CAPILLARY
Glucose-Capillary: 154 mg/dL — ABNORMAL HIGH (ref 70–99)
Glucose-Capillary: 92 mg/dL (ref 70–99)
Glucose-Capillary: 115 mg/dL — ABNORMAL HIGH (ref 70–99)
Glucose-Capillary: 95 mg/dL (ref 70–99)

## 2021-01-10 LAB — CBC WITH DIFFERENTIAL/PLATELET
Abs Immature Granulocytes: 0.45 10*3/uL — ABNORMAL HIGH (ref 0.00–0.07)
Basophils Absolute: 0 10*3/uL (ref 0.0–0.1)
Basophils Relative: 0 %
Eosinophils Absolute: 0.2 10*3/uL (ref 0.0–0.5)
Eosinophils Relative: 1 %
HCT: 25.7 % — ABNORMAL LOW (ref 39.0–52.0)
Hemoglobin: 9.2 g/dL — ABNORMAL LOW (ref 13.0–17.0)
Immature Granulocytes: 3 %
Lymphocytes Relative: 9 %
Lymphs Abs: 1.4 10*3/uL (ref 0.7–4.0)
MCH: 31.2 pg (ref 26.0–34.0)
MCHC: 35.8 g/dL (ref 30.0–36.0)
MCV: 87.1 fL (ref 80.0–100.0)
Monocytes Absolute: 1.9 10*3/uL — ABNORMAL HIGH (ref 0.1–1.0)
Monocytes Relative: 13 %
Neutro Abs: 10.7 10*3/uL — ABNORMAL HIGH (ref 1.7–7.7)
Neutrophils Relative %: 74 %
Platelets: 240 10*3/uL (ref 150–400)
RBC: 2.95 MIL/uL — ABNORMAL LOW (ref 4.22–5.81)
RDW: 13.8 % (ref 11.5–15.5)
WBC: 14.6 10*3/uL — ABNORMAL HIGH (ref 4.0–10.5)
nRBC: 0 % (ref 0.0–0.2)

## 2021-01-10 LAB — PREPARE RBC (CROSSMATCH)

## 2021-01-10 LAB — ABO/RH: ABO/RH(D): O POS

## 2021-01-10 LAB — MAGNESIUM: Magnesium: 1.7 mg/dL (ref 1.7–2.4)

## 2021-01-10 MED ORDER — HYDROMORPHONE HCL 1 MG/ML IJ SOLN
0.5000 mg | INTRAMUSCULAR | Status: DC | PRN
  Administered 2021-01-10 – 2021-01-11 (×4): 0.5 mg via INTRAVENOUS
  Filled 2021-01-10 (×4): qty 0.5

## 2021-01-10 MED ORDER — CEFAZOLIN SODIUM-DEXTROSE 2-4 GM/100ML-% IV SOLN
2.0000 g | INTRAVENOUS | Status: DC
  Filled 2021-01-10 (×2): qty 100

## 2021-01-10 MED ORDER — FUROSEMIDE 10 MG/ML IJ SOLN
40.0000 mg | Freq: Once | INTRAMUSCULAR | Status: AC
  Administered 2021-01-10: 40 mg via INTRAVENOUS
  Filled 2021-01-10: qty 4

## 2021-01-10 MED ORDER — POLYETHYLENE GLYCOL 3350 17 G PO PACK: 17.0000 g | PACK | Freq: Every day | ORAL | Status: DC | PRN

## 2021-01-10 MED ORDER — ENSURE PRE-SURGERY PO LIQD
296.0000 mL | Freq: Once | ORAL | Status: AC
  Administered 2021-01-10: 296 mL via ORAL
  Filled 2021-01-10: qty 296

## 2021-01-10 MED ORDER — SODIUM CHLORIDE 0.9% IV SOLUTION: Freq: Once | INTRAVENOUS | Status: AC

## 2021-01-10 MED ORDER — CHLORHEXIDINE GLUCONATE 4 % EX LIQD: 60.0000 mL | Freq: Once | CUTANEOUS | Status: DC

## 2021-01-10 MED ORDER — POVIDONE-IODINE 10 % EX SWAB: 2.0000 "application " | Freq: Once | CUTANEOUS | Status: DC

## 2021-01-10 MED ORDER — HYDROMORPHONE HCL 1 MG/ML IJ SOLN: 0.5000 mg | INTRAMUSCULAR | Status: DC | PRN

## 2021-01-10 NOTE — H&P (View-Only) (Signed)
ORTHOPAEDIC CONSULTATION  REQUESTING PHYSICIAN: Barb Merino, MD  Chief Complaint: Abscess with bone infection right heel.  HPI: Fernando Anthony is a 65 y.o. male who presents with abscess and osteomyelitis right calcaneus.  Patient has undergone several years of foot salvage intervention starting with a great toe amputation 5 years ago ray amputation 2 years ago status post transmetatarsal amputation and status post debridement of the abscess right heel 3 days ago.  Patient has had acute renal insufficiency with a creatinine of 5.77 elevated white cell count of 14.6 decreased hemoglobin of 9.2.  Past Medical History:  Diagnosis Date  . Back pain   . Diabetes mellitus   . Headache(784.0)    general  . Hyperlipidemia   . Hypertension   . Osteomyelitis ([place]) 11/2018   RIGHT FOOT   Past Surgical History:  Procedure Laterality Date  . AMPUTATION Right 09/18/2013   Procedure: Amputation Right Great Toe at [facility];  Surgeon: Newt Minion, MD;  Location: [facility];  Service: Orthopedics;  Laterality: Right;  Amputation Right Great Toe at [facility]  . AMPUTATION Right 12/05/2018   Procedure: PARTIAL AMPUTATION FIRST RAY RIGHT FOOT;  Surgeon: Edrick Kins, DPM;  Location: [facility];  Service: Podiatry;  Laterality: Right;  . BONE BIOPSY Right 01/06/2021   Procedure: BONE BIOPSY;  Surgeon: Edrick Kins, DPM;  Location: [facility];  Service: Podiatry;  Laterality: Right;  . CARDIAC CATHETERIZATION  ?1990  . INCISION AND DRAINAGE Right 01/06/2021   Procedure: INCISION AND DRAINAGE, ANTIBIOTIC BEAD PLACEMENT;  Surgeon: Edrick Kins, DPM;  Location: [facility];  Service: Podiatry;  Laterality: Right;  . SPINE SURGERY     Social History   Socioeconomic History  . Marital status: Married    Spouse name: Not on file  . Number of children: Not on file  . Years of education: Not on file  . Highest education level: Not on file  Occupational History  . Not on file  Tobacco Use  . Smoking status: Current  Every Day Smoker    Packs/day: 1.00    Years: 40.00    Pack years: 40.00    Types: Cigarettes  . Smokeless tobacco: Never Used  Vaping Use  . Vaping Use: Former  Substance and Sexual Activity  . Alcohol use: Not Currently  . Drug use: Not Currently    Comment: past use of marijuana  . Sexual activity: Not on file  Other Topics Concern  . Not on file  Social History Narrative  . Not on file   Social Determinants of Health   Financial Resource Strain: Not on file  Food Insecurity: Not on file  Transportation Needs: Not on file  Physical Activity: Not on file  Stress: Not on file  Social Connections: Not on file   Family History  Problem Relation Age of Onset  . Cancer Mother   . Heart disease Father    - negative except otherwise stated in the family history section Allergies  Allergen Reactions  . Vicodin [Hydrocodone-Acetaminophen] Itching   Prior to Admission medications   Medication Sig Start Date End Date Taking? Authorizing Provider  acetaminophen (TYLENOL) 500 MG tablet Take 1,500 mg by mouth every 6 (six) hours as needed.   Yes [provider]  amLODipine (NORVASC) 10 MG tablet Take 10 mg by mouth daily.   Yes [provider]  aspirin 81 MG tablet Take 81 mg by mouth at bedtime.    Yes [provider]  atenolol (TENORMIN) 50 MG tablet Take 50 mg by mouth daily.   Yes [provider]  cloNIDine (CATAPRES) 0.1 MG tablet Take 0.1 mg by mouth 2 (two) times daily. 01/31/19  Yes [provider]  collagenase (SANTYL) ointment Apply 1 application topically daily. 3.5cm x 3.5cm x 0.3cm (LxWxD) RT foot Patient taking differently: Apply 1 application topically daily as needed (foot infection). 08/26/20  Yes Edrick Kins, DPM  doxycycline (VIBRAMYCIN) 100 MG capsule Take 1 capsule (100 mg total) by mouth 2 (two) times daily. Patient taking differently: Take 100 mg by mouth 2 (two) times daily. Start date : 01/02/21 09/25/20  Yes  Daylene Katayama M, DPM  FIASP FLEXTOUCH 100 UNIT/ML SOPN Inject 13 Units into the skin 3 (three) times daily. 01/19/19  Yes [provider]  furosemide (LASIX) 20 MG tablet Take 20 mg by mouth 2 (two) times daily.   Yes [provider]  hydrALAZINE (APRESOLINE) 25 MG tablet Take 12.5 mg by mouth 2 (two) times daily.   Yes [provider]  hydrochlorothiazide (MICROZIDE) 12.5 MG capsule Take 12.5 mg by mouth daily.  08/23/18  Yes [provider]  insulin glargine, 2 Unit Dial, (TOUJEO MAX SOLOSTAR) 300 UNIT/ML Solostar Pen Inject 40 Units into the skin at bedtime.   Yes [provider]  lisinopril (ZESTRIL) 40 MG tablet Take 40 mg by mouth daily.   Yes [provider]  Omega-3 Fatty Acids (FISH OIL) 500 MG CAPS Take 500 mg by mouth at bedtime.    Yes [provider]  pregabalin (LYRICA) 50 MG capsule Take 50 mg by mouth 3 (three) times daily.   Yes [provider]  rosuvastatin (CRESTOR) 20 MG tablet Take 20 mg by mouth daily. 01/09/19  Yes [provider]  sodium chloride (OCEAN) 0.65 % SOLN nasal spray Place 1 spray into both nostrils daily as needed for congestion.   Yes [provider]  Continuous Blood Gluc Sensor (FREESTYLE LIBRE 14 DAY SENSOR) MISC ONE EACH (1 DEVICE DOSE) BY DOES NOT APPLY ROUTE EVERY 14 (FOURTEEN) DAYS. 01/19/19   [provider]  gabapentin (NEURONTIN) 100 MG capsule Take 1 capsule (100 mg total) by mouth 3 (three) times daily. Patient not taking: Reported on 01/04/2021 09/16/14   Charlett Blake, MD   No results found. - pertinent xrays, CT, MRI studies were reviewed and independently interpreted  Positive ROS: All other systems have been reviewed and were otherwise negative with the exception of those mentioned in the HPI and as above.  Physical Exam: General: Alert, no acute distress Psychiatric: Patient is competent for consent with normal mood and affect Lymphatic: No  axillary or cervical lymphadenopathy Cardiovascular: No pedal edema Respiratory: No cyanosis, no use of accessory musculature GI: No organomegaly, abdomen is soft and non-tender    Images:  '@ENCIMAGES'$ @  Labs:  Lab Results  Component Value Date   HGBA1C 10.5 (H) 01/04/2021   ESRSEDRATE 129 (H) 01/04/2021   CRP 19.3 (H) 01/04/2021   LABURIC 7.8 01/07/2021   REPTSTATUS PENDING 01/06/2021   GRAMSTAIN NO WBC SEEN RARE GRAM POSITIVE COCCI  01/06/2021   CULT  01/06/2021    NO GROWTH 2 DAYS NO ANAEROBES ISOLATED; CULTURE IN PROGRESS FOR 5 DAYS Performed at [facility], [place] [address] [place], [place] [address]    LABORGA CITROBACTER KOSERI 01/04/2021    Lab Results  Component Value Date   ALBUMIN 1.5 (L) 01/10/2021   ALBUMIN 1.7 (L) 01/09/2021  ALBUMIN 1.7 (L) 01/08/2021   LABURIC 7.8 01/07/2021    Neurologic: Patient does not have protective sensation bilateral lower extremities.   MUSCULOSKELETAL:   Skin: Examination patient has massive pitting edema in both lower extremities most likely due to the renal insufficiency and albumin of 1.5.  There is no ulcers or cellulitis or signs of infection on the left lower extremity.  Examination right lower extremity patient has a good dorsalis pedis pulse that is palpable previous ankle-brachial indices studies showed adequate circulation with triphasic flow at the anterior tibial artery with ABI 0.96 for the anterior tibial and posterior tibial 0.76 with monophasic flow.  Patient has purulent drainage from the ulcer on the right heel there is maceration and skin breakdown from his surgical debridement.  There is a pressure ulcer over the first ray 2 cm in diameter 1 mm deep without exposed bone or tendon.  Patient's hemoglobin A1c is 10.5.  Recent radiograph shows the placement of antibiotic beads and the plantar aspect of the bone biopsy.  Assessment: Assessment: Abscess osteomyelitis right calcaneus with a  transmetatarsal amputation with ulceration over the forefoot with uncontrolled type 2 diabetes severe protein caloric malnutrition acute renal insufficiency with an elevated white cell count and decreased hemoglobin.  Plan: Plan: Discussed with the patient his best option for limb salvage is a transtibial amputation.  I will get this on the schedule either Monday or Tuesday.  I will have [employer] provide compression stockings for the left leg.  Risk and benefits were discussed including postoperative care and postoperative rehab.  Patient states he understands wished to proceed with surgery at this time.  Thank you for the consult and the opportunity to see Mr. Lenna Gilford, MD [place] [phone] 8:15 AM

## 2021-01-10 NOTE — Progress Notes (Signed)
PROGRESS NOTE    Fernando Anthony  [id number] DOB: Jan 10, 1956 DOA: 01/04/2021 PCP: Curly Rim, MD    Brief Narrative:  65 year old gentleman with insulin-dependent diabetes, diabetic foot ulcer right with multiple procedures in the past, hypertension hyperlipidemia who was following at podiatry office for long time and had multiple debridements presented with calcaneal osteomyelitis.  Admitted to the hospital for surgical debridement.  Persistent infection so needing amputation to clean margin and transferred to [facility].  Patient also developed acute kidney injury and nephrology is following.   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  Citrobacter bacteremia: I&D and antibiotic bead placement 2/16 Inadequate improvement. Seen by orthopedics, Dr. Sharol Given and planning for transtibial amputation to clean margin. Continue Rocephin for 7 days or amputation to clean margin. Daptomycin is stopped on 2/18.  Acute kidney injury with azotemia: Baseline creatinine 1.2.  Presented with significant worsening renal functions. Urine output is adequate, 2 L over last 24 hours. Ultrasound of the kidneys consistent with medical renal disease, no evidence of hydronephrosis. Avoiding all nephrotoxins.  Will change morphine to Dilaudid. Followed by nephrology.  IV Lasix trial today.  Uncontrolled type 2 diabetes with hyperglycemia and diabetic foot infection: Currently blood sugars are well controlled on Lantus 35 units, prandial insulin 4 units and sliding scale insulin.   A1c is more than 10.  We will continue to titrate insulin regimen.  Essential hypertension: Stable on amlodipine, atenolol, clonidine and hydralazine.  Discontinued hydrochlorothiazide and lisinopril due to AKI.  Abnormal LFTs: Already stabilizing.  Right upper quadrant ultrasound normal.  Probably due to infection.  Shortness of breath/anasarca: Suspect due to hypoalbuminemia and pulmonary edema.  Chest x-ray was ordered and reviewed, that shows some interstitial fluid.  Lasix trial today.  Plan of care discussed with orthopedics Dr. Sharol Given. Significant swelling of the legs due to hypoalbuminemia, will benefit with compression and elevation of the leg.  Will optimize with elevation of the leg.   DVT prophylaxis: Place TED hose Start: 01/10/21 1121 SCDs Start: 01/06/21 2227 SCDs Start: 01/04/21 1945   Code Status: Full code Family Communication: Wife at the bedside Disposition Plan: Status is: Inpatient  Remains inpatient appropriate because:IV treatments appropriate due to intensity of illness or inability to take PO and Inpatient level of care appropriate due to severity of illness   Dispo: The patient is from: Home              Anticipated d/c is to: CIR              Anticipated d/c date is: > 3 days              Patient currently is not medically stable to d/c.   Difficult to place patient No         Consultants:   Orthopedics  Nephrology  Podiatry  Procedures:   Right calcaneal debridement 2/16  Antimicrobials:   Daptomycin 2/15-2/18  Rocephin 2/14----   Subjective: Patient was seen and examined.  Wife was at the bedside.  We discussed all findings.  Patient is more concerned about losing breath  going to the bathroom.  Also had some loose bowel movements.  He had 2 loose bowel movement last 24 hours.  Denies any chest pain.  On room air. Has significant swelling of the legs and moderate pain on the right foot.  Objective: Vitals:   01/09/21 1605  01/09/21 2009 01/10/21 0300 01/10/21 0926  BP: (!) 170/84 (!) 153/70 (!) 163/73 (!) 169/78  Pulse: 72 66 74 68  Resp: '17 16 17 19  '$ Temp: 98.2 F (36.8 C) 98.6 F (37 C) 98.7 F (37.1 C) 98.2 F (36.8 C)  TempSrc: Oral  Oral Oral  SpO2: 94% 96% 99% 99%  Weight:      Height:        Intake/Output Summary (Last 24 hours) at 01/10/2021 1150 Last data filed at 01/09/2021 1514 Gross per 24 hour  Intake 0 ml  Output --  Net 0 ml   Filed Weights   01/04/21 1741 01/04/21 2244 01/06/21 1813  Weight: 97.5 kg 101.9 kg 101.9 kg    Examination:  General exam: Appears anxious and in mild distress. Respiratory system: Clear to auscultation. Respiratory effort normal.  No added sounds. Cardiovascular system: S1 & S2 heard, RRR. No JVD Gastrointestinal system: Abdomen is nondistended, soft and nontender. No organomegaly or masses felt. Normal bowel sounds heard. Central nervous system: Alert and oriented. No focal neurological deficits. Extremities: Symmetric 5 x 5 power. Skin:  Right foot on postsurgical dressing.  Not removed by me. 2+ edema bilateral leg up to the thighs.    Data Reviewed: I have personally reviewed following labs and imaging studies  CBC: Recent Labs  Lab 01/04/21 1604 01/05/21 0338 01/06/21 0330 01/07/21 0325 01/08/21 0309 01/09/21 0416 01/10/21 0306  WBC 25.3*   < > 12.9* 12.1* 15.8* 12.8* 14.6*  NEUTROABS 23.3*  --   --  10.9* 12.4*  --  10.7*  HGB 9.9*   < > 9.5* 9.4* 9.3* 9.6* 9.2*  HCT 30.6*   < > 28.9* 28.2* 27.6* 28.7* 25.7*  MCV 96.2   < > 93.8 92.2 93.2 92.0 87.1  PLT 240   < > 224 202 200 217 240   < > = values in this interval not displayed.   Basic Metabolic Panel: Recent Labs  Lab 01/05/21 0355 01/06/21 0330 01/07/21 0325 01/08/21 0309 01/09/21 0416 01/10/21 0306  NA  --  131* 133* 134* 135 133*  K  --  3.5 3.6 3.5 3.7 4.3  CL  --  99 101 105 107 106  CO2  --  22 20* 19* 18* 15*  GLUCOSE  --  133* 229* 188* 175* 143*  BUN  --   66* 65* 73* 67* 62*  CREATININE  --  4.52* 5.27* 5.40* 5.70* 5.77*  CALCIUM  --  7.4* 7.5* 7.3* 7.6* 7.9*  MG 1.9  --  2.0 2.2 2.0 1.7  PHOS  --   --  5.4* 5.6* 5.6* 5.8*   GFR: Estimated Creatinine Clearance: 16.7 mL/min (A) (by C-G formula based on SCr of 5.77 mg/dL (H)). Liver Function Tests: Recent Labs  Lab 01/04/21 1604 01/05/21 0355 01/06/21 0330 01/07/21 0325 01/08/21 0309 01/09/21 0416 01/10/21 0306  AST 218* 149* 243* 212* 167*  --   --   ALT 116* 92* 133* 143* 136*  --   --   ALKPHOS 409* 370* 612* 682* 680*  --   --   BILITOT 2.1* 1.6* 2.8* 3.2* 2.3*  --   --   PROT 6.4* 5.3*  6.0* 5.8* 5.3*  --   --   ALBUMIN 2.3* 1.8* 2.0* 1.7* 1.7* 1.7* 1.5*   No results for input(s): LIPASE, AMYLASE in the last 168 hours. No results for input(s): AMMONIA in the last 168 hours. Coagulation Profile: Recent Labs  Lab 01/04/21 1649  INR 1.2   Cardiac Enzymes: Recent Labs  Lab 01/06/21 0330  CKTOTAL 65   BNP (last 3 results) No results for input(s): PROBNP in the last 8760 hours. HbA1C: No results for input(s): HGBA1C in the last 72 hours. CBG: Recent Labs  Lab 01/09/21 0723 01/09/21 1127 01/09/21 1613 01/09/21 2008 01/10/21 0640  GLUCAP 152* 93 111* 89 154*   Lipid Profile: No results for input(s): CHOL, HDL, LDLCALC, TRIG, CHOLHDL, LDLDIRECT in the last 72 hours. Thyroid Function Tests: No results for input(s): TSH, T4TOTAL, FREET4, T3FREE, THYROIDAB in the last 72 hours. Anemia Panel: No results for input(s): VITAMINB12, FOLATE, FERRITIN, TIBC, IRON, RETICCTPCT in the last 72 hours. Sepsis Labs: Recent Labs  Lab 01/04/21 1604 01/04/21 1802 01/04/21 2015 01/04/21 2253  LATICACIDVEN 2.6* 3.0* 1.4 1.7    Recent Results (from the past 240 hour(s))  WOUND CULTURE     Status: Abnormal   Collection Time: 01/04/21 12:13 PM  Result Value Ref Range Status   MICRO NUMBER: [id number]  Final   SPECIMEN QUALITY: Adequate  Final   SOURCE: WOUND (SITE NOT  SPECIFIED)  Final   STATUS: FINAL  Final   GRAM STAIN:   Final    Rare Polymorphonuclear leukocytes Few epithelial cells Many Gram positive cocci in pairs Many Gram positive cocci in clusters Many Gram negative bacilli   ISOLATE 1: Group G Streptococcus (A)  Final    Comment: Heavy growth of Group G Streptococcus Beta-hemolytic streptococci are predictably susceptible to Penicillin and other beta-lactams. Susceptibility testing not routinely performed. Please contact the laboratory within 3 days if susceptibility testing is  desired.   Aerobic Culture w Gram Stain (superficial specimen)     Status: None   Collection Time: 01/04/21  5:19 PM   Specimen: Foot; Wound  Result Value Ref Range Status   Specimen Description   Final    FOOT RIGHT Performed at [facility] [address] [place], [place] [address]    Special Requests   Final    NONE Performed at [facility], [place] [address] [place], [place] [address]    Gram Stain   Final    MODERATE WBC PRESENT, PREDOMINANTLY PMN ABUNDANT GRAM POSITIVE COCCI ABUNDANT GRAM NEGATIVE RODS MODERATE GRAM POSITIVE RODS Performed at [facility], [place] [address], [place], [place] [address]    Culture FEW CITROBACTER KOSERI  Final   Report Status 01/09/2021 FINAL  Final   Organism ID, Bacteria CITROBACTER KOSERI  Final      Susceptibility   Citrobacter koseri - MIC*    CEFAZOLIN <=4 SENSITIVE Sensitive     CEFEPIME <=0.12 SENSITIVE Sensitive     CEFTAZIDIME <=1 SENSITIVE Sensitive     CEFTRIAXONE <=0.25 SENSITIVE Sensitive     CIPROFLOXACIN <=0.25 SENSITIVE Sensitive     GENTAMICIN <=1 SENSITIVE Sensitive     IMIPENEM <=0.25 SENSITIVE Sensitive     TRIMETH/SULFA <=20 SENSITIVE Sensitive     PIP/TAZO 8 SENSITIVE Sensitive     * FEW CITROBACTER KOSERI  Culture, blood (routine x 2)     Status: None   Collection Time: 01/04/21  5:23 PM   Specimen: BLOOD  Result Value Ref Range  Status   Specimen  Description   Final    BLOOD LEFT ANTECUBITAL Performed at [facility] [address], [place], [place] [address]    Special Requests   Final    BOTTLES DRAWN AEROBIC AND ANAEROBIC Blood Culture results may not be optimal due to an inadequate volume of blood received in culture bottles Performed at [facility] [address], [place], [place] [address]    Culture   Final    NO GROWTH 5 DAYS Performed at [facility], [place] [address] [place], [place] [address]    Report Status 01/09/2021 FINAL  Final  Culture, blood (routine x 2)     Status: Abnormal (Preliminary result)   Collection Time: 01/04/21  6:04 PM   Specimen: BLOOD  Result Value Ref Range Status   Specimen Description   Final    BLOOD RIGHT ANTECUBITAL Performed at [facility] [address], [place], [place] [address]    Special Requests   Final    BOTTLES DRAWN AEROBIC AND ANAEROBIC Blood Culture adequate volume Performed at [facility] [address], [place], [place] [address]    Culture  Setup Time   Final    GRAM NEGATIVE RODS AEROBIC BOTTLE ONLY Organism ID to follow CRITICAL RESULT CALLED TO, READ BACK BY AND VERIFIED WITH: PHARMD VERANDA BRYK BY MESSAN HY. AT 2330 ON 01/09/2021    Culture (A)  Final    CITROBACTER KOSERI SUSCEPTIBILITIES TO FOLLOW Performed at [facility], [place] [address] [place], [place] [address]    Report Status PENDING  Incomplete  Blood Culture ID Panel (Reflexed)     Status: Abnormal   Collection Time: 01/04/21  6:04 PM  Result Value Ref Range Status   Enterococcus faecalis NOT DETECTED NOT DETECTED Final   Enterococcus Faecium NOT DETECTED NOT DETECTED Final   Listeria monocytogenes NOT DETECTED NOT DETECTED Final   Staphylococcus species NOT DETECTED NOT DETECTED Final   Staphylococcus aureus (BCID) NOT DETECTED NOT DETECTED Final   Staphylococcus epidermidis NOT DETECTED NOT DETECTED  Final   Staphylococcus lugdunensis NOT DETECTED NOT DETECTED Final   Streptococcus species NOT DETECTED NOT DETECTED Final   Streptococcus agalactiae NOT DETECTED NOT DETECTED Final   Streptococcus pneumoniae NOT DETECTED NOT DETECTED Final   Streptococcus pyogenes NOT DETECTED NOT DETECTED Final   A.calcoaceticus-baumannii NOT DETECTED NOT DETECTED Final   Bacteroides fragilis NOT DETECTED NOT DETECTED Final   Enterobacterales DETECTED (A) NOT DETECTED Final    Comment: Enterobacterales represent a large order of gram negative bacteria, not a single organism. Refer to culture for further identification. CRITICAL RESULT CALLED TO, READ BACK BY AND VERIFIED WITH: PHARMD VERANDA BRYK BY MESSAN HY. AT 2330 ON 01/09/2021    Enterobacter cloacae complex NOT DETECTED NOT DETECTED Final   Escherichia coli NOT DETECTED NOT DETECTED Final   Klebsiella aerogenes NOT DETECTED NOT DETECTED Final   Klebsiella oxytoca NOT DETECTED NOT DETECTED Final   Klebsiella pneumoniae NOT DETECTED NOT DETECTED Final   Proteus species NOT DETECTED NOT DETECTED Final   Salmonella species NOT DETECTED NOT DETECTED Final   Serratia marcescens NOT DETECTED NOT DETECTED Final   Haemophilus influenzae NOT DETECTED NOT DETECTED Final   Neisseria meningitidis NOT DETECTED NOT DETECTED Final   Pseudomonas aeruginosa NOT DETECTED NOT DETECTED Final   Stenotrophomonas maltophilia NOT DETECTED NOT DETECTED Final   Candida albicans NOT DETECTED NOT DETECTED Final   Candida auris NOT  DETECTED NOT DETECTED Final   Candida glabrata NOT DETECTED NOT DETECTED Final   Candida krusei NOT DETECTED NOT DETECTED Final   Candida parapsilosis NOT DETECTED NOT DETECTED Final   Candida tropicalis NOT DETECTED NOT DETECTED Final   Cryptococcus neoformans/gattii NOT DETECTED NOT DETECTED Final   CTX-M ESBL NOT DETECTED NOT DETECTED Final   Carbapenem resistance IMP NOT DETECTED NOT DETECTED Final   Carbapenem resistance KPC NOT DETECTED  NOT DETECTED Final   Carbapenem resistance NDM NOT DETECTED NOT DETECTED Final   Carbapenem resist OXA 48 LIKE NOT DETECTED NOT DETECTED Final   Carbapenem resistance VIM NOT DETECTED NOT DETECTED Final    Comment: Performed at [facility], [place] [address], [place], [place] [address]  Resp Panel by RT-PCR (Flu A&B, Covid) Nasopharyngeal Swab     Status: None   Collection Time: 01/04/21  6:05 PM   Specimen: Nasopharyngeal Swab; Nasopharyngeal(NP) swabs in vial transport medium  Result Value Ref Range Status   SARS Coronavirus 2 by RT PCR NEGATIVE NEGATIVE Final    Comment: (NOTE) SARS-CoV-2 target nucleic acids are NOT DETECTED.  The SARS-CoV-2 RNA is generally detectable in upper respiratory specimens during the acute phase of infection. The lowest concentration of SARS-CoV-2 viral copies this assay can detect is 138 copies/mL. A negative result does not preclude SARS-Cov-2 infection and should not be used as the sole basis for treatment or other patient management decisions. A negative result may occur with  improper specimen collection/handling, submission of specimen other than nasopharyngeal swab, presence of viral mutation(s) within the areas targeted by this assay, and inadequate number of viral copies(<138 copies/mL). A negative result must be combined with clinical observations, patient history, and epidemiological information. The expected result is Negative.  Fact Sheet for Patients:  EntrepreneurPulse.com.au  Fact Sheet for Healthcare Providers:  IncredibleEmployment.be  This test is no t yet approved or cleared by the [place] FDA and  has been authorized for detection and/or diagnosis of SARS-CoV-2 by FDA under an Emergency Use Authorization (EUA). This EUA will remain  in effect (meaning this test can be used) for the duration of the COVID-19 declaration under Section 564(b)(1) of the Act, 21 U.S.C.section  360bbb-3(b)(1), unless the authorization is terminated  or revoked sooner.       Influenza A by PCR NEGATIVE NEGATIVE Final   Influenza B by PCR NEGATIVE NEGATIVE Final    Comment: (NOTE) The Xpert Xpress SARS-CoV-2/FLU/RSV plus assay is intended as an aid in the diagnosis of influenza from Nasopharyngeal swab specimens and should not be used as a sole basis for treatment. Nasal washings and aspirates are unacceptable for Xpert Xpress SARS-CoV-2/FLU/RSV testing.  Fact Sheet for Patients: EntrepreneurPulse.com.au  Fact Sheet for Healthcare Providers: IncredibleEmployment.be  This test is not yet approved or cleared by the [place] FDA and has been authorized for detection and/or diagnosis of SARS-CoV-2 by FDA under an Emergency Use Authorization (EUA). This EUA will remain in effect (meaning this test can be used) for the duration of the COVID-19 declaration under Section 564(b)(1) of the Act, 21 U.S.C. section 360bbb-3(b)(1), unless the authorization is terminated or revoked.  Performed at [facility], [place] [address], [place], [place] [address]   Surgical PCR screen     Status: None   Collection Time: 01/04/21 10:41 PM   Specimen: Nasal Mucosa; Nasal Swab  Result Value Ref Range Status   MRSA, PCR NEGATIVE NEGATIVE Final   Staphylococcus aureus NEGATIVE NEGATIVE Final  Comment: (NOTE) The Xpert SA Assay (FDA approved for NASAL specimens in patients 75 years of age and older), is one component of a comprehensive surveillance program. It is not intended to diagnose infection nor to guide or monitor treatment. Performed at [facility], [place] [address] [place], [place] [address]   Aerobic/Anaerobic Culture w Gram Stain (surgical/deep wound)     Status: None (Preliminary result)   Collection Time: 01/06/21  8:46 PM   Specimen: Abscess  Result Value Ref Range Status   Specimen Description    Final    ABSCESS RIGHT HEEL Performed at [facility] [address], [place], [place] [address]    Special Requests   Final    NONE Performed at [facility], [place] [address], [place], [place] [address]    Gram Stain NO WBC SEEN RARE GRAM POSITIVE COCCI   Final   Culture   Final    NO GROWTH 2 DAYS NO ANAEROBES ISOLATED; CULTURE IN PROGRESS FOR 5 DAYS Performed at [facility], [address], [place], [place] [address]    Report Status PENDING  Incomplete         Radiology Studies: DG CHEST PORT 1 VIEW  Result Date: 01/10/2021 CLINICAL DATA:  65 year old male with shortness of breath EXAM: PORTABLE CHEST 1 VIEW COMPARISON:  01/04/2021, 12/28/2014 FINDINGS: Cardiomediastinal silhouette unchanged in size and contour. No pneumothorax. No pleural effusion. Coarsened interstitial markings similar to the prior plain film. Interlobular septal thickening bilaterally is more pronounced than the plain film of 2016. No new confluent airspace disease. No displaced fracture IMPRESSION: Persisting interlobular septal thickening from the prior plain film, new from the remote comparison 12/28/2014. This is most suggestive of persisting pulmonary edema although atypical infection could have this appearance. Electronically Signed   By: Corrie Mckusick D.O.   On: 01/10/2021 10:59        Scheduled Meds: . sodium chloride   Intravenous Once  . amLODipine  10 mg Oral Daily  . aspirin EC  81 mg Oral QHS  . atenolol  50 mg Oral Daily  . cloNIDine  0.1 mg Oral BID  . furosemide  40 mg Intravenous Once  . hydrALAZINE  50 mg Oral BID  . insulin aspart  0-15 Units Subcutaneous TID WC  . insulin aspart  0-5 Units Subcutaneous QHS  . insulin aspart  4 Units Subcutaneous TID WC  . insulin glargine  35 Units Subcutaneous QHS  . omega-3 acid ethyl esters  1 g Oral Daily  . pregabalin  50 mg Oral TID  . tamsulosin  0.4 mg Oral QPC supper   Continuous  Infusions: . cefTRIAXone (ROCEPHIN)  IV 2 g (01/09/21 2231)     LOS: 6 days    Time spent: 32 minutes     Barb Merino, MD [facility] Pager [phone]

## 2021-01-10 NOTE — Progress Notes (Signed)
  Echocardiogram 2D Echocardiogram has been performed.  [name] F 01/10/2021, 2:02 PM

## 2021-01-10 NOTE — Progress Notes (Signed)
PHARMACY - PHYSICIAN COMMUNICATION CRITICAL VALUE ALERT - BLOOD CULTURE IDENTIFICATION (BCID)  Fernando Anthony is an 64 y.o. male who presented to [facility] on 01/04/2021 with a chief complaint of wound infection.  Assessment:  Started on broad-spectrum IV ABX for osteomyelitis, then narrowed to Rocephin for pansensitive citrobacter koseri, now w/ blood cx growing enterbacterales in 1 of 4 bottles, no ID yet but likely same as foot cx.  Name of physician (or Provider) ContactedSusa Griffins NP  Current antibiotics: Rocephin  Changes to prescribed antibiotics recommended:  Patient is on recommended antibiotics - No changes needed but f/u on ID to ensure bacteria is the same as foot cx.  Results for orders placed or performed during the hospital encounter of 01/04/21  Blood Culture ID Panel (Reflexed) (Collected: 01/04/2021  6:04 PM)  Result Value Ref Range   Enterococcus faecalis NOT DETECTED NOT DETECTED   Enterococcus Faecium NOT DETECTED NOT DETECTED   Listeria monocytogenes NOT DETECTED NOT DETECTED   Staphylococcus species NOT DETECTED NOT DETECTED   Staphylococcus aureus (BCID) NOT DETECTED NOT DETECTED   Staphylococcus epidermidis NOT DETECTED NOT DETECTED   Staphylococcus lugdunensis NOT DETECTED NOT DETECTED   Streptococcus species NOT DETECTED NOT DETECTED   Streptococcus agalactiae NOT DETECTED NOT DETECTED   Streptococcus pneumoniae NOT DETECTED NOT DETECTED   Streptococcus pyogenes NOT DETECTED NOT DETECTED   A.calcoaceticus-baumannii NOT DETECTED NOT DETECTED   Bacteroides fragilis NOT DETECTED NOT DETECTED   Enterobacterales DETECTED (A) NOT DETECTED   Enterobacter cloacae complex NOT DETECTED NOT DETECTED   Escherichia coli NOT DETECTED NOT DETECTED   Klebsiella aerogenes NOT DETECTED NOT DETECTED   Klebsiella oxytoca NOT DETECTED NOT DETECTED   Klebsiella pneumoniae NOT DETECTED NOT DETECTED   Proteus species NOT DETECTED NOT DETECTED   Salmonella species NOT DETECTED  NOT DETECTED   Serratia marcescens NOT DETECTED NOT DETECTED   Haemophilus influenzae NOT DETECTED NOT DETECTED   Neisseria meningitidis NOT DETECTED NOT DETECTED   Pseudomonas aeruginosa NOT DETECTED NOT DETECTED   Stenotrophomonas maltophilia NOT DETECTED NOT DETECTED   Candida albicans NOT DETECTED NOT DETECTED   Candida auris NOT DETECTED NOT DETECTED   Candida glabrata NOT DETECTED NOT DETECTED   Candida krusei NOT DETECTED NOT DETECTED   Candida parapsilosis NOT DETECTED NOT DETECTED   Candida tropicalis NOT DETECTED NOT DETECTED   Cryptococcus neoformans/gattii NOT DETECTED NOT DETECTED   CTX-M ESBL NOT DETECTED NOT DETECTED   Carbapenem resistance IMP NOT DETECTED NOT DETECTED   Carbapenem resistance KPC NOT DETECTED NOT DETECTED   Carbapenem resistance NDM NOT DETECTED NOT DETECTED   Carbapenem resist OXA 48 LIKE NOT DETECTED NOT DETECTED   Carbapenem resistance VIM NOT DETECTED NOT DETECTED    Wynona Neat, PharmD, BCPS  01/10/2021  12:12 AM

## 2021-01-10 NOTE — Consult Note (Signed)
ORTHOPAEDIC CONSULTATION  REQUESTING PHYSICIAN: Barb Merino, MD  Chief Complaint: Abscess with bone infection right heel.  HPI: Fernando Anthony is a 65 y.o. male who presents with abscess and osteomyelitis right calcaneus.  Patient has undergone several years of foot salvage intervention starting with a great toe amputation 5 years ago ray amputation 2 years ago status post transmetatarsal amputation and status post debridement of the abscess right heel 3 days ago.  Patient has had acute renal insufficiency with a creatinine of 5.77 elevated white cell count of 14.6 decreased hemoglobin of 9.2.  Past Medical History:  Diagnosis Date  . Back pain   . Diabetes mellitus   . Headache(784.0)    general  . Hyperlipidemia   . Hypertension   . Osteomyelitis ([place]) 11/2018   RIGHT FOOT   Past Surgical History:  Procedure Laterality Date  . AMPUTATION Right 09/18/2013   Procedure: Amputation Right Great Toe at [facility];  Surgeon: Newt Minion, MD;  Location: [facility];  Service: Orthopedics;  Laterality: Right;  Amputation Right Great Toe at [facility]  . AMPUTATION Right 12/05/2018   Procedure: PARTIAL AMPUTATION FIRST RAY RIGHT FOOT;  Surgeon: Edrick Kins, DPM;  Location: [facility];  Service: Podiatry;  Laterality: Right;  . BONE BIOPSY Right 01/06/2021   Procedure: BONE BIOPSY;  Surgeon: Edrick Kins, DPM;  Location: [facility];  Service: Podiatry;  Laterality: Right;  . CARDIAC CATHETERIZATION  ?1990  . INCISION AND DRAINAGE Right 01/06/2021   Procedure: INCISION AND DRAINAGE, ANTIBIOTIC BEAD PLACEMENT;  Surgeon: Edrick Kins, DPM;  Location: [facility];  Service: Podiatry;  Laterality: Right;  . SPINE SURGERY     Social History   Socioeconomic History  . Marital status: Married    Spouse name: Not on file  . Number of children: Not on file  . Years of education: Not on file  . Highest education level: Not on file  Occupational History  . Not on file  Tobacco Use  . Smoking status: Current  Every Day Smoker    Packs/day: 1.00    Years: 40.00    Pack years: 40.00    Types: Cigarettes  . Smokeless tobacco: Never Used  Vaping Use  . Vaping Use: Former  Substance and Sexual Activity  . Alcohol use: Not Currently  . Drug use: Not Currently    Comment: past use of marijuana  . Sexual activity: Not on file  Other Topics Concern  . Not on file  Social History Narrative  . Not on file   Social Determinants of Health   Financial Resource Strain: Not on file  Food Insecurity: Not on file  Transportation Needs: Not on file  Physical Activity: Not on file  Stress: Not on file  Social Connections: Not on file   Family History  Problem Relation Age of Onset  . Cancer Mother   . Heart disease Father    - negative except otherwise stated in the family history section Allergies  Allergen Reactions  . Vicodin [Hydrocodone-Acetaminophen] Itching   Prior to Admission medications   Medication Sig Start Date End Date Taking? Authorizing Provider  acetaminophen (TYLENOL) 500 MG tablet Take 1,500 mg by mouth every 6 (six) hours as needed.   Yes [provider]  amLODipine (NORVASC) 10 MG tablet Take 10 mg by mouth daily.   Yes [provider]  aspirin 81 MG tablet Take 81 mg by mouth at bedtime.    Yes [provider]  atenolol (TENORMIN) 50 MG tablet Take 50 mg by mouth daily.   Yes [provider]  cloNIDine (CATAPRES) 0.1 MG tablet Take 0.1 mg by mouth 2 (two) times daily. 01/31/19  Yes [provider]  collagenase (SANTYL) ointment Apply 1 application topically daily. 3.5cm x 3.5cm x 0.3cm (LxWxD) RT foot Patient taking differently: Apply 1 application topically daily as needed (foot infection). 08/26/20  Yes Edrick Kins, DPM  doxycycline (VIBRAMYCIN) 100 MG capsule Take 1 capsule (100 mg total) by mouth 2 (two) times daily. Patient taking differently: Take 100 mg by mouth 2 (two) times daily. Start date : 01/02/21 09/25/20  Yes  Daylene Katayama M, DPM  FIASP FLEXTOUCH 100 UNIT/ML SOPN Inject 13 Units into the skin 3 (three) times daily. 01/19/19  Yes [provider]  furosemide (LASIX) 20 MG tablet Take 20 mg by mouth 2 (two) times daily.   Yes [provider]  hydrALAZINE (APRESOLINE) 25 MG tablet Take 12.5 mg by mouth 2 (two) times daily.   Yes [provider]  hydrochlorothiazide (MICROZIDE) 12.5 MG capsule Take 12.5 mg by mouth daily.  08/23/18  Yes [provider]  insulin glargine, 2 Unit Dial, (TOUJEO MAX SOLOSTAR) 300 UNIT/ML Solostar Pen Inject 40 Units into the skin at bedtime.   Yes [provider]  lisinopril (ZESTRIL) 40 MG tablet Take 40 mg by mouth daily.   Yes [provider]  Omega-3 Fatty Acids (FISH OIL) 500 MG CAPS Take 500 mg by mouth at bedtime.    Yes [provider]  pregabalin (LYRICA) 50 MG capsule Take 50 mg by mouth 3 (three) times daily.   Yes [provider]  rosuvastatin (CRESTOR) 20 MG tablet Take 20 mg by mouth daily. 01/09/19  Yes [provider]  sodium chloride (OCEAN) 0.65 % SOLN nasal spray Place 1 spray into both nostrils daily as needed for congestion.   Yes [provider]  Continuous Blood Gluc Sensor (FREESTYLE LIBRE 14 DAY SENSOR) MISC ONE EACH (1 DEVICE DOSE) BY DOES NOT APPLY ROUTE EVERY 14 (FOURTEEN) DAYS. 01/19/19   [provider]  gabapentin (NEURONTIN) 100 MG capsule Take 1 capsule (100 mg total) by mouth 3 (three) times daily. Patient not taking: Reported on 01/04/2021 09/16/14   Charlett Blake, MD   No results found. - pertinent xrays, CT, MRI studies were reviewed and independently interpreted  Positive ROS: All other systems have been reviewed and were otherwise negative with the exception of those mentioned in the HPI and as above.  Physical Exam: General: Alert, no acute distress Psychiatric: Patient is competent for consent with normal mood and affect Lymphatic: No  axillary or cervical lymphadenopathy Cardiovascular: No pedal edema Respiratory: No cyanosis, no use of accessory musculature GI: No organomegaly, abdomen is soft and non-tender    Images:  '@ENCIMAGES'$ @  Labs:  Lab Results  Component Value Date   HGBA1C 10.5 (H) 01/04/2021   ESRSEDRATE 129 (H) 01/04/2021   CRP 19.3 (H) 01/04/2021   LABURIC 7.8 01/07/2021   REPTSTATUS PENDING 01/06/2021   GRAMSTAIN NO WBC SEEN RARE GRAM POSITIVE COCCI  01/06/2021   CULT  01/06/2021    NO GROWTH 2 DAYS NO ANAEROBES ISOLATED; CULTURE IN PROGRESS FOR 5 DAYS Performed at [facility], [place] [address] [place], [place] [address]    LABORGA CITROBACTER KOSERI 01/04/2021    Lab Results  Component Value Date   ALBUMIN 1.5 (L) 01/10/2021   ALBUMIN 1.7 (L) 01/09/2021  ALBUMIN 1.7 (L) 01/08/2021   LABURIC 7.8 01/07/2021    Neurologic: Patient does not have protective sensation bilateral lower extremities.   MUSCULOSKELETAL:   Skin: Examination patient has massive pitting edema in both lower extremities most likely due to the renal insufficiency and albumin of 1.5.  There is no ulcers or cellulitis or signs of infection on the left lower extremity.  Examination right lower extremity patient has a good dorsalis pedis pulse that is palpable previous ankle-brachial indices studies showed adequate circulation with triphasic flow at the anterior tibial artery with ABI 0.96 for the anterior tibial and posterior tibial 0.76 with monophasic flow.  Patient has purulent drainage from the ulcer on the right heel there is maceration and skin breakdown from his surgical debridement.  There is a pressure ulcer over the first ray 2 cm in diameter 1 mm deep without exposed bone or tendon.  Patient's hemoglobin A1c is 10.5.  Recent radiograph shows the placement of antibiotic beads and the plantar aspect of the bone biopsy.  Assessment: Assessment: Abscess osteomyelitis right calcaneus with a  transmetatarsal amputation with ulceration over the forefoot with uncontrolled type 2 diabetes severe protein caloric malnutrition acute renal insufficiency with an elevated white cell count and decreased hemoglobin.  Plan: Plan: Discussed with the patient his best option for limb salvage is a transtibial amputation.  I will get this on the schedule either Monday or Tuesday.  I will have [employer] provide compression stockings for the left leg.  Risk and benefits were discussed including postoperative care and postoperative rehab.  Patient states he understands wished to proceed with surgery at this time.  Thank you for the consult and the opportunity to see Mr. Lenna Gilford, MD [place] [phone] 8:15 AM

## 2021-01-10 NOTE — Progress Notes (Signed)
Patient ID: Fernando Anthony, male   DOB: [birth year], 65 y.o.   MRN: [id number] [facility] Progress Note   Assessment/ Plan:   1.  Acute kidney injury: Nonoliguric and suspected to have hemodynamically mediated ATN.  Creatinine appears relatively unchanged overnight and likely indicative of the plateau phase of ATN; BUN continues to trend down.  Decent urine output which I will augment today with furosemide 40 mg IV to help promote volume unloading before upcoming surgery tomorrow. 2.  Acute osteomyelitis of the right foot: Status post surgical debridement by podiatry with implantation of antibiotic beads-on systemic antibiotics.  Unfortunately, because of his chronicity of infection that he has dealt with over the last 2 years and unsuccessful efforts at limb salvage, he is now scheduled for right below-knee amputation possibly tomorrow at 4 PM. 3.  Anemia: Likely secondary to chronic illness including his recent chronic infection/osteomyelitis.  Low iron saturation however, infection precluding intravenous iron administration.  Continue to monitor hemoglobin/hematocrit trend. 4.  Hypertension: Intermittently elevated blood pressures noted and currently his diuretic/ACE inhibitor are on hold; continue atenolol, clonidine and amlodipine and tamsulosin.  Will give a single dose of furosemide today. 5.  Diabetes mellitus: Insulin-dependent and poorly controlled at baseline-management per primary service. Subjective:   He has been keeping a log of his urine output and had about 2 L urine output overnight; concerned about leg swelling and surgery tomorrow.   Objective:   BP (!) 169/78 (BP Location: Right Arm)   Pulse 68   Temp 98.2 F (36.8 C) (Oral)   Resp 19   Ht '6\' 2"'$  (1.88 m)   Wt 101.9 kg   SpO2 99%   BMI 28.84 kg/m   Intake/Output Summary (Last 24 hours) at 01/10/2021 1107 Last data filed at 01/09/2021 1514 Gross per 24 hour  Intake 0 ml  Output 350 ml  Net -350 ml   Weight  change:   Physical Exam: Gen: Sleeping comfortably flat in bed CVS: Pulse regular rhythm, normal rate, S1 and S2 normal Resp: Clear to auscultation bilaterally, no rales/rhonchi Abd: Soft, obese, nontender, bowel sounds normal Ext: 1-2+ bilateral pitting lower extremity edema with right foot in dressing  Imaging: DG CHEST PORT 1 VIEW  Result Date: 01/10/2021 CLINICAL DATA:  65 year old male with shortness of breath EXAM: PORTABLE CHEST 1 VIEW COMPARISON:  01/04/2021, 12/28/2014 FINDINGS: Cardiomediastinal silhouette unchanged in size and contour. No pneumothorax. No pleural effusion. Coarsened interstitial markings similar to the prior plain film. Interlobular septal thickening bilaterally is more pronounced than the plain film of 2016. No new confluent airspace disease. No displaced fracture IMPRESSION: Persisting interlobular septal thickening from the prior plain film, new from the remote comparison 12/28/2014. This is most suggestive of persisting pulmonary edema although atypical infection could have this appearance. Electronically Signed   By: Corrie Mckusick D.O.   On: 01/10/2021 10:59    Labs: BMET Recent Labs  Lab 01/04/21 1604 01/05/21 0338 01/06/21 0330 01/07/21 0325 01/08/21 0309 01/09/21 0416 01/10/21 0306  NA 129* 128* 131* 133* 134* 135 133*  K 3.4* 3.2* 3.5 3.6 3.5 3.7 4.3  CL 91* 94* 99 101 105 107 106  CO2 '24 23 22 '$ 20* 19* 18* 15*  GLUCOSE 239* 127* 133* 229* 188* 175* 143*  BUN 56* 56* 66* 65* 73* 67* 62*  CREATININE 3.94* 4.05* 4.52* 5.27* 5.40* 5.70* 5.77*  CALCIUM 7.9* 7.2* 7.4* 7.5* 7.3* 7.6* 7.9*  PHOS  --   --   --  5.4* 5.6*  5.6* 5.8*   CBC Recent Labs  Lab 01/04/21 1604 01/05/21 0338 01/07/21 0325 01/08/21 0309 01/09/21 0416 01/10/21 0306  WBC 25.3*   < > 12.1* 15.8* 12.8* 14.6*  NEUTROABS 23.3*  --  10.9* 12.4*  --  10.7*  HGB 9.9*   < > 9.4* 9.3* 9.6* 9.2*  HCT 30.6*   < > 28.2* 27.6* 28.7* 25.7*  MCV 96.2   < > 92.2 93.2 92.0 87.1  PLT 240    < > 202 200 217 240   < > = values in this interval not displayed.    Medications:    . sodium chloride   Intravenous Once  . amLODipine  10 mg Oral Daily  . aspirin EC  81 mg Oral QHS  . atenolol  50 mg Oral Daily  . cloNIDine  0.1 mg Oral BID  . hydrALAZINE  50 mg Oral BID  . insulin aspart  0-15 Units Subcutaneous TID WC  . insulin aspart  0-5 Units Subcutaneous QHS  . insulin aspart  4 Units Subcutaneous TID WC  . insulin glargine  35 Units Subcutaneous QHS  . omega-3 acid ethyl esters  1 g Oral Daily  . pregabalin  50 mg Oral TID  . tamsulosin  0.4 mg Oral QPC supper   Elmarie Shiley, MD 01/10/2021, 11:07 AM

## 2021-01-11 ENCOUNTER — Encounter (HOSPITAL_COMMUNITY)
Admission: EM | Disposition: A | Discharge status: Rehabilitation Facility | Payer: Self-pay | Source: Home / Self Care | Attending: Internal Medicine

## 2021-01-11 ENCOUNTER — Encounter (HOSPITAL_COMMUNITY): Payer: Self-pay | Admitting: Family Medicine

## 2021-01-11 ENCOUNTER — Inpatient Hospital Stay (HOSPITAL_COMMUNITY): Payer: BC Managed Care – PPO | Admitting: Certified Registered Nurse Anesthetist

## 2021-01-11 DIAGNOSIS — R7881 Bacteremia: Secondary | ICD-10-CM

## 2021-01-11 DIAGNOSIS — E1165 Type 2 diabetes mellitus with hyperglycemia: Secondary | ICD-10-CM | POA: Diagnosis not present

## 2021-01-11 DIAGNOSIS — E43 Unspecified severe protein-calorie malnutrition: Secondary | ICD-10-CM | POA: Diagnosis not present

## 2021-01-11 DIAGNOSIS — E1121 Type 2 diabetes mellitus with diabetic nephropathy: Secondary | ICD-10-CM | POA: Diagnosis not present

## 2021-01-11 DIAGNOSIS — N179 Acute kidney failure, unspecified: Secondary | ICD-10-CM | POA: Diagnosis not present

## 2021-01-11 DIAGNOSIS — M86171 Other acute osteomyelitis, right ankle and foot: Secondary | ICD-10-CM

## 2021-01-11 DIAGNOSIS — R945 Abnormal results of liver function studies: Secondary | ICD-10-CM

## 2021-01-11 HISTORY — PX: APPLICATION OF WOUND VAC: SHX5189

## 2021-01-11 HISTORY — PX: AMPUTATION: SHX166

## 2021-01-11 LAB — GLUCOSE, CAPILLARY
Glucose-Capillary: 115 mg/dL — ABNORMAL HIGH (ref 70–99)
Glucose-Capillary: 132 mg/dL — ABNORMAL HIGH (ref 70–99)
Glucose-Capillary: 142 mg/dL — ABNORMAL HIGH (ref 70–99)
Glucose-Capillary: 145 mg/dL — ABNORMAL HIGH (ref 70–99)
Glucose-Capillary: 202 mg/dL — ABNORMAL HIGH (ref 70–99)
Glucose-Capillary: 212 mg/dL — ABNORMAL HIGH (ref 70–99)
Glucose-Capillary: 295 mg/dL — ABNORMAL HIGH (ref 70–99)

## 2021-01-11 LAB — CBC WITH DIFFERENTIAL/PLATELET
Abs Immature Granulocytes: 0.6 10*3/uL — ABNORMAL HIGH (ref 0.00–0.07)
Basophils Absolute: 0 10*3/uL (ref 0.0–0.1)
Basophils Relative: 0 %
Eosinophils Absolute: 0.2 10*3/uL (ref 0.0–0.5)
Eosinophils Relative: 2 %
HCT: 26.5 % — ABNORMAL LOW (ref 39.0–52.0)
Hemoglobin: 9.2 g/dL — ABNORMAL LOW (ref 13.0–17.0)
Immature Granulocytes: 5 %
Lymphocytes Relative: 10 %
Lymphs Abs: 1.2 10*3/uL (ref 0.7–4.0)
MCH: 30.6 pg (ref 26.0–34.0)
MCHC: 34.7 g/dL (ref 30.0–36.0)
MCV: 88 fL (ref 80.0–100.0)
Monocytes Absolute: 1.7 10*3/uL — ABNORMAL HIGH (ref 0.1–1.0)
Monocytes Relative: 14 %
Neutro Abs: 8.1 10*3/uL — ABNORMAL HIGH (ref 1.7–7.7)
Neutrophils Relative %: 69 %
Platelets: 215 10*3/uL (ref 150–400)
RBC: 3.01 MIL/uL — ABNORMAL LOW (ref 4.22–5.81)
RDW: 14.6 % (ref 11.5–15.5)
WBC: 11.8 10*3/uL — ABNORMAL HIGH (ref 4.0–10.5)
nRBC: 0 % (ref 0.0–0.2)

## 2021-01-11 LAB — COMPREHENSIVE METABOLIC PANEL
ALT: 118 U/L — ABNORMAL HIGH (ref 0–44)
AST: 90 U/L — ABNORMAL HIGH (ref 15–41)
Albumin: 1.5 g/dL — ABNORMAL LOW (ref 3.5–5.0)
Alkaline Phosphatase: 600 U/L — ABNORMAL HIGH (ref 38–126)
Anion gap: 10 (ref 5–15)
BUN: 63 mg/dL — ABNORMAL HIGH (ref 8–23)
CO2: 16 mmol/L — ABNORMAL LOW (ref 22–32)
Calcium: 7.9 mg/dL — ABNORMAL LOW (ref 8.9–10.3)
Chloride: 107 mmol/L (ref 98–111)
Creatinine, Ser: 5.96 mg/dL — ABNORMAL HIGH (ref 0.61–1.24)
GFR, Estimated: 10 mL/min — ABNORMAL LOW (ref >60)
Glucose, Bld: 154 mg/dL — ABNORMAL HIGH (ref 70–99)
Potassium: 4.2 mmol/L (ref 3.5–5.1)
Sodium: 133 mmol/L — ABNORMAL LOW (ref 135–145)
Total Bilirubin: 4.4 mg/dL — ABNORMAL HIGH (ref 0.3–1.2)
Total Protein: 5.5 g/dL — ABNORMAL LOW (ref 6.5–8.1)

## 2021-01-11 LAB — TYPE AND SCREEN
ABO/RH(D): O POS
Antibody Screen: NEGATIVE
Unit division: 0

## 2021-01-11 LAB — BPAM RBC
Blood Product Expiration Date: 202203222359
ISSUE DATE / TIME: 202202201306
Unit Type and Rh: 5100

## 2021-01-11 LAB — CULTURE, BLOOD (ROUTINE X 2): Special Requests: ADEQUATE

## 2021-01-11 LAB — PHOSPHORUS: Phosphorus: 6.1 mg/dL — ABNORMAL HIGH (ref 2.5–4.6)

## 2021-01-11 LAB — MAGNESIUM: Magnesium: 1.6 mg/dL — ABNORMAL LOW (ref 1.7–2.4)

## 2021-01-11 SURGERY — AMPUTATION BELOW KNEE
Anesthesia: Regional | Site: Leg Lower | Laterality: Right

## 2021-01-11 MED ORDER — CHLORHEXIDINE GLUCONATE 0.12 % MT SOLN
OROMUCOSAL | Status: AC
  Administered 2021-01-11: 15 mL via OROMUCOSAL
  Filled 2021-01-11: qty 15

## 2021-01-11 MED ORDER — CLONIDINE HCL (ANALGESIA) 100 MCG/ML EP SOLN
EPIDURAL | Status: DC | PRN
  Administered 2021-01-11: 50 ug

## 2021-01-11 MED ORDER — FENTANYL CITRATE (PF) 100 MCG/2ML IJ SOLN: 25.0000 ug | INTRAMUSCULAR | Status: DC | PRN

## 2021-01-11 MED ORDER — METHOCARBAMOL 1000 MG/10ML IJ SOLN
500.0000 mg | Freq: Four times a day (QID) | INTRAVENOUS | Status: DC | PRN
  Filled 2021-01-11: qty 5

## 2021-01-11 MED ORDER — CEFAZOLIN SODIUM-DEXTROSE 2-3 GM-%(50ML) IV SOLR
INTRAVENOUS | Status: DC | PRN
  Administered 2021-01-11: 2 g via INTRAVENOUS

## 2021-01-11 MED ORDER — METOCLOPRAMIDE HCL 5 MG/ML IJ SOLN
5.0000 mg | Freq: Three times a day (TID) | INTRAMUSCULAR | Status: DC | PRN
Start: 2021-01-11 — End: 2021-01-16

## 2021-01-11 MED ORDER — SODIUM CHLORIDE 0.9 % IV SOLN: INTRAVENOUS | Status: DC

## 2021-01-11 MED ORDER — ACETAMINOPHEN 325 MG PO TABS
325.0000 mg | ORAL_TABLET | Freq: Four times a day (QID) | ORAL | Status: DC | PRN
  Administered 2021-01-11: 650 mg via ORAL
  Filled 2021-01-11: qty 2

## 2021-01-11 MED ORDER — FENTANYL CITRATE (PF) 100 MCG/2ML IJ SOLN
INTRAMUSCULAR | Status: AC
  Administered 2021-01-11: 50 ug via INTRAVENOUS
  Filled 2021-01-11: qty 2

## 2021-01-11 MED ORDER — FENTANYL CITRATE (PF) 100 MCG/2ML IJ SOLN: 50.0000 ug | Freq: Once | INTRAMUSCULAR | Status: DC

## 2021-01-11 MED ORDER — POLYETHYLENE GLYCOL 3350 17 G PO PACK: 17.0000 g | PACK | Freq: Every day | ORAL | Status: DC | PRN

## 2021-01-11 MED ORDER — ONDANSETRON HCL 4 MG/2ML IJ SOLN
INTRAMUSCULAR | Status: DC | PRN
  Administered 2021-01-11: 4 mg via INTRAVENOUS

## 2021-01-11 MED ORDER — PROPOFOL 10 MG/ML IV BOLUS
INTRAVENOUS | Status: DC | PRN
  Administered 2021-01-11: 100 ug/kg/min via INTRAVENOUS

## 2021-01-11 MED ORDER — MIDAZOLAM HCL 2 MG/2ML IJ SOLN: 2.0000 mg | Freq: Once | INTRAMUSCULAR | Status: DC

## 2021-01-11 MED ORDER — DOCUSATE SODIUM 100 MG PO CAPS
100.0000 mg | ORAL_CAPSULE | Freq: Two times a day (BID) | ORAL | Status: DC
  Administered 2021-01-11 – 2021-01-13 (×4): 100 mg via ORAL
  Filled 2021-01-11 (×7): qty 1

## 2021-01-11 MED ORDER — OXYCODONE HCL 5 MG PO TABS
10.0000 mg | ORAL_TABLET | ORAL | Status: DC | PRN
  Administered 2021-01-11 – 2021-01-16 (×25): 15 mg via ORAL
  Filled 2021-01-11 (×23): qty 3

## 2021-01-11 MED ORDER — DEXMEDETOMIDINE (PRECEDEX) IN NS 20 MCG/5ML (4 MCG/ML) IV SYRINGE
PREFILLED_SYRINGE | INTRAVENOUS | Status: DC | PRN
  Administered 2021-01-11 (×6): 4 ug via INTRAVENOUS

## 2021-01-11 MED ORDER — BUPIVACAINE-EPINEPHRINE (PF) 0.5% -1:200000 IJ SOLN
INTRAMUSCULAR | Status: DC | PRN
  Administered 2021-01-11: 10 mL via PERINEURAL
  Administered 2021-01-11: 30 mL via PERINEURAL

## 2021-01-11 MED ORDER — MIDAZOLAM HCL 2 MG/2ML IJ SOLN
INTRAMUSCULAR | Status: AC
  Filled 2021-01-11: qty 2

## 2021-01-11 MED ORDER — CHLORHEXIDINE GLUCONATE 0.12 % MT SOLN: 15.0000 mL | Freq: Once | OROMUCOSAL | Status: AC

## 2021-01-11 MED ORDER — ONDANSETRON HCL 4 MG/2ML IJ SOLN: 4.0000 mg | Freq: Four times a day (QID) | INTRAMUSCULAR | Status: DC | PRN

## 2021-01-11 MED ORDER — METOCLOPRAMIDE HCL 5 MG PO TABS: 5.0000 mg | ORAL_TABLET | Freq: Three times a day (TID) | ORAL | Status: DC | PRN

## 2021-01-11 MED ORDER — OXYCODONE HCL 5 MG PO TABS
5.0000 mg | ORAL_TABLET | ORAL | Status: DC | PRN
  Filled 2021-01-11 (×2): qty 2
  Filled 2021-01-11 (×2): qty 1

## 2021-01-11 MED ORDER — PROPOFOL 10 MG/ML IV BOLUS
INTRAVENOUS | Status: DC | PRN
  Administered 2021-01-11 (×2): 20 mg via INTRAVENOUS
  Administered 2021-01-11: 100 mg via INTRAVENOUS

## 2021-01-11 MED ORDER — SODIUM CHLORIDE 0.9 % IV SOLN: INTRAVENOUS | Status: DC | PRN

## 2021-01-11 MED ORDER — HYDROMORPHONE HCL 1 MG/ML IJ SOLN
0.5000 mg | INTRAMUSCULAR | Status: DC | PRN
  Administered 2021-01-11 – 2021-01-15 (×20): 1 mg via INTRAVENOUS
  Administered 2021-01-15: 0.5 mg via INTRAVENOUS
  Administered 2021-01-15 – 2021-01-16 (×3): 1 mg via INTRAVENOUS
  Filled 2021-01-11 (×24): qty 1

## 2021-01-11 MED ORDER — MAGNESIUM CITRATE PO SOLN: 1.0000 | Freq: Once | ORAL | Status: DC | PRN

## 2021-01-11 MED ORDER — ONDANSETRON HCL 4 MG/2ML IJ SOLN: 4.0000 mg | Freq: Once | INTRAMUSCULAR | Status: DC | PRN

## 2021-01-11 MED ORDER — 0.9 % SODIUM CHLORIDE (POUR BTL) OPTIME
TOPICAL | Status: DC | PRN
  Administered 2021-01-11: 1000 mL

## 2021-01-11 MED ORDER — ONDANSETRON HCL 4 MG PO TABS: 4.0000 mg | ORAL_TABLET | Freq: Four times a day (QID) | ORAL | Status: DC | PRN

## 2021-01-11 MED ORDER — EPHEDRINE SULFATE 50 MG/ML IJ SOLN
INTRAMUSCULAR | Status: DC | PRN
  Administered 2021-01-11: 5 mg via INTRAVENOUS
  Administered 2021-01-11: 10 mg via INTRAVENOUS
  Administered 2021-01-11: 15 mg via INTRAVENOUS

## 2021-01-11 MED ORDER — METHOCARBAMOL 500 MG PO TABS
500.0000 mg | ORAL_TABLET | Freq: Four times a day (QID) | ORAL | Status: DC | PRN
  Administered 2021-01-11 – 2021-01-15 (×6): 500 mg via ORAL
  Filled 2021-01-11 (×6): qty 1

## 2021-01-11 MED ORDER — BISACODYL 10 MG RE SUPP: 10.0000 mg | Freq: Every day | RECTAL | Status: DC | PRN

## 2021-01-11 SURGICAL SUPPLY — 36 items
BLADE SAW RECIP 87.9 MT (BLADE) ×2 IMPLANT
BLADE SURG 21 STRL SS (BLADE) ×2 IMPLANT
BNDG COHESIVE 6X5 TAN STRL LF (GAUZE/BANDAGES/DRESSINGS) ×1 IMPLANT
CANISTER WOUND CARE 500ML ATS (WOUND CARE) ×2 IMPLANT
CUFF TOURN SGL QUICK 34 (TOURNIQUET CUFF) ×2
CUFF TRNQT CYL 34X4.125X (TOURNIQUET CUFF) ×1 IMPLANT
DRAPE DERMATAC (DRAPES) ×2 IMPLANT
DRAPE INCISE IOBAN 66X45 STRL (DRAPES) ×2 IMPLANT
DRAPE U-SHAPE 47X51 STRL (DRAPES) ×2 IMPLANT
DRESSING PREVENA PLUS CUSTOM (GAUZE/BANDAGES/DRESSINGS) ×1 IMPLANT
DRSG PREVENA PLUS CUSTOM (GAUZE/BANDAGES/DRESSINGS)
DURAPREP 26ML APPLICATOR (WOUND CARE) ×2 IMPLANT
ELECT REM PT RETURN 9FT ADLT (ELECTROSURGICAL) ×2
ELECTRODE REM PT RTRN 9FT ADLT (ELECTROSURGICAL) ×1 IMPLANT
GLOVE BIOGEL PI IND STRL 9 (GLOVE) ×1 IMPLANT
GLOVE BIOGEL PI INDICATOR 9 (GLOVE) ×1
GLOVE SURG ORTHO 9.0 STRL STRW (GLOVE) ×2 IMPLANT
GOWN STRL REUS W/ TWL XL LVL3 (GOWN DISPOSABLE) ×2 IMPLANT
GOWN STRL REUS W/TWL XL LVL3 (GOWN DISPOSABLE) ×4
KIT BASIN OR (CUSTOM PROCEDURE TRAY) ×2 IMPLANT
KIT TURNOVER KIT B (KITS) ×2 IMPLANT
MANIFOLD NEPTUNE II (INSTRUMENTS) ×2 IMPLANT
NS IRRIG 1000ML POUR BTL (IV SOLUTION) ×2 IMPLANT
PACK ORTHO EXTREMITY (CUSTOM PROCEDURE TRAY) ×2 IMPLANT
PAD ARMBOARD 7.5X6 YLW CONV (MISCELLANEOUS) ×2 IMPLANT
PREVENA RESTOR ARTHOFORM 46X30 (CANNISTER) ×2 IMPLANT
SPONGE LAP 18X18 RF (DISPOSABLE) IMPLANT
STAPLER VISISTAT 35W (STAPLE) ×1 IMPLANT
STOCKINETTE IMPERVIOUS LG (DRAPES) ×2 IMPLANT
SUT ETHILON 2 0 PSLX (SUTURE) IMPLANT
SUT SILK 2 0 (SUTURE) ×2
SUT SILK 2-0 18XBRD TIE 12 (SUTURE) ×1 IMPLANT
SUT VIC AB 1 CTX 27 (SUTURE) ×4 IMPLANT
TOWEL GREEN STERILE (TOWEL DISPOSABLE) ×2 IMPLANT
TUBE CONNECTING 12X1/4 (SUCTIONS) ×2 IMPLANT
YANKAUER SUCT BULB TIP NO VENT (SUCTIONS) ×2 IMPLANT

## 2021-01-11 NOTE — Anesthesia Preprocedure Evaluation (Addendum)
Anesthesia Evaluation  Patient identified by MRN, date of birth, ID band Patient awake    Reviewed: Allergy & Precautions, NPO status , Patient's Chart, lab work & pertinent test results, reviewed documented beta blocker date and time   Airway Mallampati: II  TM Distance: >3 FB Neck ROM: Full    Dental  (+) Teeth Intact, Dental Advisory Given   Pulmonary Current Smoker and Patient abstained from smoking.,    Pulmonary exam normal breath sounds clear to auscultation       Cardiovascular hypertension, Pt. on home beta blockers and Pt. on medications Normal cardiovascular exam Rhythm:Regular Rate:Normal     Neuro/Psych  Headaches, negative psych ROS   GI/Hepatic negative GI ROS, Neg liver ROS,   Endo/Other  diabetes, Type 2, Insulin Dependent  Renal/GU Renal InsufficiencyRenal disease     Musculoskeletal OSTEOMYELITIS RIGHT FOOT   Abdominal   Peds  Hematology  (+) Blood dyscrasia, anemia ,   Anesthesia Other Findings Day of surgery medications reviewed with the patient.  Reproductive/Obstetrics                            Anesthesia Physical Anesthesia Plan  ASA: III  Anesthesia Plan: Regional   Post-op Pain Management:    Induction: Intravenous  PONV Risk Score and Plan: 0 and Propofol infusion and Midazolam  Airway Management Planned: Nasal Cannula and Natural Airway  Additional Equipment:   Intra-op Plan:   Post-operative Plan:   Informed Consent: I have reviewed the patients History and Physical, chart, labs and discussed the procedure including the risks, benefits and alternatives for the proposed anesthesia with the patient or authorized representative who has indicated his/her understanding and acceptance.     Dental advisory given  Plan Discussed with: CRNA  Anesthesia Plan Comments:         Anesthesia Quick Evaluation

## 2021-01-11 NOTE — Op Note (Addendum)
OPERATIVE REPORT Patient name: Fernando Anthony MRN: [id number] DOB: 02-22-1956  DOS: 01/06/2021  Preop Dx: Osteomyelitis right calcaneus.  Ulcer with abscess right heel Postop Dx: same  Procedure:  1.  Incision and drainage right heel 2.  Bone biopsy/exostectomy with placement of antibiotic beads right calcaneus  Surgeon: Edrick Kins DPM  Anesthesia: 50-50 mixture of 2% lidocaine plain with 0.5% Marcaine plain totaling 20 mL infiltrated in the patient's right lower extremity  Hemostasis: Calf tourniquet inflated to a pressure of 254mHg without Esmarch exsanguination   EBL: Minimal mL Materials: Stimulan Osteoset absorbable antibiotic beads with 500 mg vancomycin powder and 600 mg of tobramycin powder  Injectables: None  Pathology: Bone biopsy right calcaneus sent to pathology for gross and microscopic exam  Condition: The patient tolerated the procedure and anesthesia well. No complications noted or reported   Justification for procedure: The patient is a 65y.o. male who presents today for surgical correction of a draining ulcer/abscess with suspicion of osteomyelitis right calcaneus. All conservative modalities of been unsuccessful in providing any sort of satisfactory alleviation of symptoms with the patient. The patient was told benefits as well as possible side effects of the surgery. The patient consented for surgical correction. The patient consent form was reviewed. All patient questions were answered. No guarantees were expressed or implied. The patient and the surgeon boson the patient consent form with the witness present and placed in the patient's chart.   Procedure in Detail: The patient was brought to the operating room, placed in the operating table in the supine position at which time an aseptic scrub and drape were performed about the patient's respective lower extremity after anesthesia was induced as described above. Attention was then directed to the surgical  area where procedure number one commenced.  Procedure #1: Incision and drainage right heel A 5 cm linear axial skin incision was planned and made overlying the medial aspect of the right heel.  The incision was carried down to the level of bone and calcaneus with care taken to cut clamp ligate and retract away all small neurovascular structures traversing the incision site.  At this time any devitalized necrotic tissue within the incision site and plantar heel was surgically excised and debrided using a surgical #15 scalpel.  There was no tracking or tunneling identified.  No heavy purulence noted.  There was a significant portion of devitalized necrotic tissue to the plantar aspect of the heel however. After appropriate excision and debridement of the devitalized tissue copious irrigation was utilized.  Additional soft tissue dissection was made to expose the medial calcaneal tubercle.  The periosteum was reflected away in preparation for procedure #2.  Procedure #2: Bone biopsy right calcaneus After appropriate exposure and visualization of the medial calcaneal tubercle of the calcaneus through the incision site, a Jamshidi core biopsy needle was utilized to obtain a core sample of calcaneus.  This core sample was removed and placed in a sterile specimen container and sent to pathology for gross microscopic exam.  After obtaining core biopsy, attention was directed to the plantar calcaneal spur.  The osseous structures to the plantar calcaneal spur appeared to be necrotic and demineralized suspicious for osteomyelitis.  A sagittal blade mounted a sagittal saw in combination with an osteotome was utilized to resect away the plantar calcaneal spur.  Verified by intraoperative x-ray fluoroscopy.  At this time OsteoSet absorbable antibiotic beads in combination with 500 mg of vancomycin powder and 600 mg of tobramycin powder were  placed within the medial incision site after copious irrigation.  Primary closure  was obtained using stainless steel skin staples.  Quarter inch iodoform packing was also placed to the plantar ulcer/draining sinus.  Dry sterile compressive dressings were then applied to all previously mentioned incision sites about the patient's lower extremity. The tourniquet which was used for hemostasis was deflated. All normal neurovascular responses including pink color and warmth returned all the digits of patient's lower extremity.  The patient was then transferred from the operating room to the recovery room having tolerated the procedure and anesthesia well. All vital signs are stable. After a brief stay in the recovery room the patient was readmitted to inpatient room with adequate prescriptions for analgesia. Verbal as well as written instructions were provided for the patient regarding wound care. The patient is to keep the dressings clean dry and intact until they are to follow surgeon Dr. Daylene Katayama in the office upon discharge.   Edrick Kins, DPM [facility]  Dr. Edrick Kins, DPM    [address], [place] [address]                Office [phone]  Fax [phone]

## 2021-01-11 NOTE — Interval H&P Note (Signed)
History and Physical Interval Note:  01/11/2021 4:39 PM  [name]  has presented today for surgery, with the diagnosis of OSTEOMYELITIS RIGHT FOOT.  The various methods of treatment have been discussed with the patient and family. After consideration of risks, benefits and other options for treatment, the patient has consented to  Procedure(s): AMPUTATION BELOW KNEE (Right) as a surgical intervention.  The patient's history has been reviewed, patient examined, no change in status, stable for surgery.  I have reviewed the patient's chart and labs.  Questions were answered to the patient's satisfaction.     Newt Minion

## 2021-01-11 NOTE — Progress Notes (Signed)
PROGRESS NOTE    Fernando Anthony  [id number] DOB: November 27, 1955 DOA: 01/04/2021 PCP: Curly Rim, MD    Brief Narrative:  65 year old gentleman with insulin-dependent diabetes, diabetic foot ulcer right with multiple procedures in the past, hypertension hyperlipidemia who was following at podiatry office for long time and had multiple debridements presented with calcaneal osteomyelitis.  Admitted to the hospital for surgical debridement.  Persistent infection so needing amputation to clean margin and transferred to [facility].  Patient also developed acute kidney injury and nephrology is following.   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  Citrobacter bacteremia: I&D and antibiotic bead placement 2/16 Inadequate improvement. Seen by orthopedics, Dr. Sharol Given and planning for transtibial amputation to clean margin  Today. Continue Rocephin for 7 days.  Daptomycin is stopped on 2/18.  Acute kidney injury with azotemia: Baseline creatinine 1.2.  Presented with significant worsening renal functions. Urine output is adequate.  Ultrasound of the kidneys consistent with medical renal disease, no evidence of hydronephrosis. Avoiding all nephrotoxins.  change morphine to Dilaudid. Followed by nephrology.   Uncontrolled type 2 diabetes with hyperglycemia and diabetic foot infection: Currently blood sugars are well controlled on Lantus 35 units, prandial insulin 4 units and sliding scale insulin.  A1c is more than 10.  We will continue to titrate insulin  regimen.  Essential hypertension: Stable on amlodipine, atenolol, clonidine and hydralazine.  Discontinued hydrochlorothiazide and lisinopril due to AKI.  Abnormal LFTs: Bilirubin is 4.  Transaminases improving.  Probably due to acute illness.  May have suffered mild shock liver secondary to acute infection and hypovolemia.   Right upper quadrant ultrasound showed some fluid around the gallbladder but no evidence of a stone.  CBD was 3 mm.   Patient has no abdominal pain.  Will monitor to ensure stabilization.  Shortness of breath/anasarca: Suspect due to hypoalbuminemia and pulmonary edema.  Chest x-ray was ordered and reviewed, that shows some interstitial fluid.  Lasix trial was successful. 2D echocardiogram was essentially normal.   DVT prophylaxis: Place TED hose Start: 01/10/21 1121 SCDs Start: 01/06/21 2227 SCDs Start: 01/04/21 1945   Code Status: Full code Family Communication: Wife at the bedside 2/20. Disposition Plan: Status is: Inpatient  Remains inpatient appropriate because:IV treatments appropriate due to intensity of illness or inability to take PO and Inpatient level of care appropriate due to severity of illness   Dispo: The patient is from: Home              Anticipated d/c is to: CIR              Anticipated d/c date is: > 3 days              Patient currently is not medically stable to d/c.   Difficult to place patient No         Consultants:   Orthopedics  Nephrology  Podiatry  Procedures:   Right calcaneal debridement 2/16  Antimicrobials:   Daptomycin 2/15-2/18  Rocephin 2/14----   Subjective: Patient seen and examined.  No overnight events.  Does have moderate amount of pain  on his right calcaneum.  Leg swelling is better.  His breathing is slightly better today.  Looking forward to go to surgery.  Objective: Vitals:   01/10/21 1549 01/10/21 2000 01/11/21 0200 01/11/21 0924  BP: (!) 145/85 (!) 159/78 (!) 155/76 (!) 154/72  Pulse:  63 69 64 61  Resp: '18 16 16   '$ Temp: 98.4 F (36.9 C) 98.6 F (37 C) 98.4 F (36.9 C) 97.8 F (36.6 C)  TempSrc: Oral Oral  Oral  SpO2: 94% 94% 96% 96%  Weight:      Height:        Intake/Output Summary (Last 24 hours) at 01/11/2021 1118 Last data filed at 01/10/2021 1800 Gross per 24 hour  Intake 321.06 ml  Output 400 ml  Net -78.94 ml   Filed Weights   01/04/21 1741 01/04/21 2244 01/06/21 1813  Weight: 97.5 kg 101.9 kg 101.9 kg    Examination:  General exam: Appears comfortable on room air. Respiratory system: Clear to auscultation. Respiratory effort normal.  No added sounds. Cardiovascular system: S1 & S2 heard, RRR. No JVD Gastrointestinal system: Abdomen is nondistended, soft and nontender. No organomegaly or masses felt. Normal bowel sounds heard. Central nervous system: Alert and oriented. No focal neurological deficits. Extremities: Symmetric 5 x 5 power. Skin:  Right foot on postsurgical dressing.  Not removed by me. 1+ edema bilateral leg.    Data Reviewed: I have personally reviewed following labs and imaging studies  CBC: Recent Labs  Lab 01/04/21 1604 01/05/21 0338 01/07/21 0325 01/08/21 0309 01/09/21 0416 01/10/21 0306 01/10/21 1938 01/11/21 0306  WBC 25.3*   < > 12.1* 15.8* 12.8* 14.6*  --  11.8*  NEUTROABS 23.3*  --  10.9* 12.4*  --  10.7*  --  8.1*  HGB 9.9*   < > 9.4* 9.3* 9.6* 9.2* 9.7* 9.2*  HCT 30.6*   < > 28.2* 27.6* 28.7* 25.7* 28.3* 26.5*  MCV 96.2   < > 92.2 93.2 92.0 87.1  --  88.0  PLT 240   < > 202 200 217 240  --  215   < > = values in this interval not displayed.   Basic Metabolic Panel: Recent Labs  Lab 01/07/21 0325 01/08/21 0309 01/09/21 0416 01/10/21 0306 01/11/21 0306  NA 133* 134* 135 133* 133*  K 3.6 3.5 3.7 4.3 4.2  CL 101 105 107 106 107  CO2 20* 19* 18* 15* 16*  GLUCOSE 229* 188* 175* 143* 154*  BUN 65* 73* 67* 62* 63*  CREATININE 5.27* 5.40* 5.70* 5.77* 5.96*  CALCIUM 7.5* 7.3* 7.6* 7.9* 7.9*  MG 2.0  2.2 2.0 1.7 1.6*  PHOS 5.4* 5.6* 5.6* 5.8* 6.1*   GFR: Estimated Creatinine Clearance: 16.2 mL/min (A) (by C-G formula based on SCr of 5.96 mg/dL (H)). Liver Function Tests: Recent Labs  Lab 01/05/21 0355 01/06/21 0330 01/07/21 0325 01/08/21 0309 01/09/21 0416 01/10/21 0306 01/11/21 0306  AST 149* 243* 212* 167*  --   --  90*  ALT 92* 133* 143* 136*  --   --  118*  ALKPHOS 370* 612* 682* 680*  --   --  600*  BILITOT 1.6* 2.8* 3.2* 2.3*  --   --  4.4*  PROT 5.3* 6.0* 5.8* 5.3*  --   --  5.5*  ALBUMIN 1.8* 2.0* 1.7* 1.7* 1.7* 1.5* 1.5*   No results for input(s): LIPASE, AMYLASE in the last 168 hours. No results for input(s): AMMONIA in the last 168 hours. Coagulation Profile: Recent  Labs  Lab 01/04/21 1649  INR 1.2   Cardiac Enzymes: Recent Labs  Lab 01/06/21 0330  CKTOTAL 65   BNP (last 3 results) No results for input(s): PROBNP in the last 8760 hours. HbA1C: No results for input(s): HGBA1C in the last 72 hours. CBG: Recent Labs  Lab 01/10/21 0640 01/10/21 1225 01/10/21 1607 01/10/21 2217 01/11/21 0929  GLUCAP 154* 92 115* 95 115*   Lipid Profile: No results for input(s): CHOL, HDL, LDLCALC, TRIG, CHOLHDL, LDLDIRECT in the last 72 hours. Thyroid Function Tests: No results for input(s): TSH, T4TOTAL, FREET4, T3FREE, THYROIDAB in the last 72 hours. Anemia Panel: No results for input(s): VITAMINB12, FOLATE, FERRITIN, TIBC, IRON, RETICCTPCT in the last 72 hours. Sepsis Labs: Recent Labs  Lab 01/04/21 1604 01/04/21 1802 01/04/21 2015 01/04/21 2253  LATICACIDVEN 2.6* 3.0* 1.4 1.7    Recent Results (from the past 240 hour(s))  WOUND CULTURE     Status: Abnormal   Collection Time: 01/04/21 12:13 PM  Result Value Ref Range Status   MICRO NUMBER: [id number]  Final   SPECIMEN QUALITY: Adequate  Final   SOURCE: WOUND (SITE NOT SPECIFIED)  Final   STATUS: FINAL  Final   GRAM STAIN:   Final    Rare Polymorphonuclear leukocytes Few epithelial cells Many Gram  positive cocci in pairs Many Gram positive cocci in clusters Many Gram negative bacilli   ISOLATE 1: Group G Streptococcus (A)  Final    Comment: Heavy growth of Group G Streptococcus Beta-hemolytic streptococci are predictably susceptible to Penicillin and other beta-lactams. Susceptibility testing not routinely performed. Please contact the laboratory within 3 days if susceptibility testing is  desired.   Aerobic Culture w Gram Stain (superficial specimen)     Status: None   Collection Time: 01/04/21  5:19 PM   Specimen: Foot; Wound  Result Value Ref Range Status   Specimen Description   Final    FOOT RIGHT Performed at [facility] [address] [place], [place] [address]    Special Requests   Final    NONE Performed at [facility], [place] [address] [place], [place] [address]    Gram Stain   Final    MODERATE WBC PRESENT, PREDOMINANTLY PMN ABUNDANT GRAM POSITIVE COCCI ABUNDANT GRAM NEGATIVE RODS MODERATE GRAM POSITIVE RODS Performed at [facility], [place] [address], [place], [place] [address]    Culture FEW CITROBACTER KOSERI  Final   Report Status 01/09/2021 FINAL  Final   Organism ID, Bacteria CITROBACTER KOSERI  Final      Susceptibility   Citrobacter koseri - MIC*    CEFAZOLIN <=4 SENSITIVE Sensitive     CEFEPIME <=0.12 SENSITIVE Sensitive     CEFTAZIDIME <=1 SENSITIVE Sensitive     CEFTRIAXONE <=0.25 SENSITIVE Sensitive     CIPROFLOXACIN <=0.25 SENSITIVE Sensitive     GENTAMICIN <=1 SENSITIVE Sensitive     IMIPENEM <=0.25 SENSITIVE Sensitive     TRIMETH/SULFA <=20 SENSITIVE Sensitive     PIP/TAZO 8 SENSITIVE Sensitive     * FEW CITROBACTER KOSERI  Culture, blood (routine x 2)     Status: None   Collection Time: 01/04/21  5:23 PM   Specimen: BLOOD  Result Value Ref Range Status   Specimen Description   Final    BLOOD LEFT ANTECUBITAL Performed at [facility] [address] [place], [place]  [address]    Special Requests   Final    BOTTLES DRAWN AEROBIC AND ANAEROBIC  Blood Culture results may not be optimal due to an inadequate volume of blood received in culture bottles Performed at [facility] [address] [place], [place] [address]    Culture   Final    NO GROWTH 5 DAYS Performed at [facility], [place] [address] [place], [place] [address]    Report Status 01/09/2021 FINAL  Final  Culture, blood (routine x 2)     Status: Abnormal   Collection Time: 01/04/21  6:04 PM   Specimen: BLOOD  Result Value Ref Range Status   Specimen Description   Final    BLOOD RIGHT ANTECUBITAL Performed at [facility] [address] [place], [place] [address]    Special Requests   Final    BOTTLES DRAWN AEROBIC AND ANAEROBIC Blood Culture adequate volume Performed at [facility] [address], [place], [place] [address]    Culture  Setup Time   Final    GRAM NEGATIVE RODS AEROBIC BOTTLE ONLY CRITICAL RESULT CALLED TO, READ BACK BY AND VERIFIED WITH: PHARMD VERANDA BRYK BY MESSAN HY. AT 2330 ON 01/09/2021 Performed at [facility] [address], [place], [place] [address]    Culture CITROBACTER KOSERI (A)  Final   Report Status 01/11/2021 FINAL  Final   Organism ID, Bacteria CITROBACTER KOSERI  Final      Susceptibility   Citrobacter koseri - MIC*    CEFAZOLIN <=4 SENSITIVE Sensitive     CEFEPIME <=0.12 SENSITIVE Sensitive     CEFTAZIDIME <=1 SENSITIVE Sensitive     CEFTRIAXONE <=0.25 SENSITIVE Sensitive     CIPROFLOXACIN <=0.25 SENSITIVE Sensitive     GENTAMICIN <=1 SENSITIVE Sensitive     IMIPENEM <=0.25 SENSITIVE Sensitive     TRIMETH/SULFA <=20 SENSITIVE Sensitive     PIP/TAZO 8 SENSITIVE Sensitive     * CITROBACTER KOSERI  Blood Culture ID Panel (Reflexed)     Status: Abnormal   Collection Time: 01/04/21  6:04 PM  Result Value Ref Range Status   Enterococcus faecalis NOT DETECTED NOT DETECTED Final    Enterococcus Faecium NOT DETECTED NOT DETECTED Final   Listeria monocytogenes NOT DETECTED NOT DETECTED Final   Staphylococcus species NOT DETECTED NOT DETECTED Final   Staphylococcus aureus (BCID) NOT DETECTED NOT DETECTED Final   Staphylococcus epidermidis NOT DETECTED NOT DETECTED Final   Staphylococcus lugdunensis NOT DETECTED NOT DETECTED Final   Streptococcus species NOT DETECTED NOT DETECTED Final   Streptococcus agalactiae NOT DETECTED NOT DETECTED Final   Streptococcus pneumoniae NOT DETECTED NOT DETECTED Final   Streptococcus pyogenes NOT DETECTED NOT DETECTED Final   A.calcoaceticus-baumannii NOT DETECTED NOT DETECTED Final   Bacteroides fragilis NOT DETECTED NOT DETECTED Final   Enterobacterales DETECTED (A) NOT DETECTED Final    Comment: Enterobacterales represent a large order of gram negative bacteria, not a single organism. Refer to culture for further identification. CRITICAL RESULT CALLED TO, READ BACK BY AND VERIFIED WITH: PHARMD VERANDA BRYK BY MESSAN HY. AT 2330 ON 01/09/2021    Enterobacter cloacae complex NOT DETECTED NOT DETECTED Final   Escherichia coli NOT DETECTED NOT DETECTED Final   Klebsiella aerogenes NOT DETECTED NOT DETECTED Final   Klebsiella oxytoca NOT DETECTED NOT DETECTED Final   Klebsiella pneumoniae NOT DETECTED NOT DETECTED Final   Proteus species NOT DETECTED NOT DETECTED Final   Salmonella species NOT DETECTED NOT DETECTED Final   Serratia marcescens NOT DETECTED NOT DETECTED Final   Haemophilus influenzae NOT DETECTED NOT  DETECTED Final   Neisseria meningitidis NOT DETECTED NOT DETECTED Final   Pseudomonas aeruginosa NOT DETECTED NOT DETECTED Final   Stenotrophomonas maltophilia NOT DETECTED NOT DETECTED Final   Candida albicans NOT DETECTED NOT DETECTED Final   Candida auris NOT DETECTED NOT DETECTED Final   Candida glabrata NOT DETECTED NOT DETECTED Final   Candida krusei NOT DETECTED NOT DETECTED Final   Candida parapsilosis NOT  DETECTED NOT DETECTED Final   Candida tropicalis NOT DETECTED NOT DETECTED Final   Cryptococcus neoformans/gattii NOT DETECTED NOT DETECTED Final   CTX-M ESBL NOT DETECTED NOT DETECTED Final   Carbapenem resistance IMP NOT DETECTED NOT DETECTED Final   Carbapenem resistance KPC NOT DETECTED NOT DETECTED Final   Carbapenem resistance NDM NOT DETECTED NOT DETECTED Final   Carbapenem resist OXA 48 LIKE NOT DETECTED NOT DETECTED Final   Carbapenem resistance VIM NOT DETECTED NOT DETECTED Final    Comment: Performed at [facility], [address] [place], [place] [address]  Resp Panel by RT-PCR (Flu A&B, Covid) Nasopharyngeal Swab     Status: None   Collection Time: 01/04/21  6:05 PM   Specimen: Nasopharyngeal Swab; Nasopharyngeal(NP) swabs in vial transport medium  Result Value Ref Range Status   SARS Coronavirus 2 by RT PCR NEGATIVE NEGATIVE Final    Comment: (NOTE) SARS-CoV-2 target nucleic acids are NOT DETECTED.  The SARS-CoV-2 RNA is generally detectable in upper respiratory specimens during the acute phase of infection. The lowest concentration of SARS-CoV-2 viral copies this assay can detect is 138 copies/mL. A negative result does not preclude SARS-Cov-2 infection and should not be used as the sole basis for treatment or other patient management decisions. A negative result may occur with  improper specimen collection/handling, submission of specimen other than nasopharyngeal swab, presence of viral mutation(s) within the areas targeted by this assay, and inadequate number of viral copies(<138 copies/mL). A negative result must be combined with clinical observations, patient history, and epidemiological information. The expected result is Negative.  Fact Sheet for Patients:  EntrepreneurPulse.com.au  Fact Sheet for Healthcare Providers:  IncredibleEmployment.be  This test is no t yet approved or cleared by the [place] FDA and   has been authorized for detection and/or diagnosis of SARS-CoV-2 by FDA under an Emergency Use Authorization (EUA). This EUA will remain  in effect (meaning this test can be used) for the duration of the COVID-19 declaration under Section 564(b)(1) of the Act, 21 U.S.C.section 360bbb-3(b)(1), unless the authorization is terminated  or revoked sooner.       Influenza A by PCR NEGATIVE NEGATIVE Final   Influenza B by PCR NEGATIVE NEGATIVE Final    Comment: (NOTE) The Xpert Xpress SARS-CoV-2/FLU/RSV plus assay is intended as an aid in the diagnosis of influenza from Nasopharyngeal swab specimens and should not be used as a sole basis for treatment. Nasal washings and aspirates are unacceptable for Xpert Xpress SARS-CoV-2/FLU/RSV testing.  Fact Sheet for Patients: EntrepreneurPulse.com.au  Fact Sheet for Healthcare Providers: IncredibleEmployment.be  This test is not yet approved or cleared by the [place] FDA and has been authorized for detection and/or diagnosis of SARS-CoV-2 by FDA under an Emergency Use Authorization (EUA). This EUA will remain in effect (meaning this test can be used) for the duration of the COVID-19 declaration under Section 564(b)(1) of the Act, 21 U.S.C. section 360bbb-3(b)(1), unless the authorization is terminated or revoked.  Performed at [facility], [place] [address], [place], [place] [address]   Surgical PCR  screen     Status: None   Collection Time: 01/04/21 10:41 PM   Specimen: Nasal Mucosa; Nasal Swab  Result Value Ref Range Status   MRSA, PCR NEGATIVE NEGATIVE Final   Staphylococcus aureus NEGATIVE NEGATIVE Final    Comment: (NOTE) The Xpert SA Assay (FDA approved for NASAL specimens in patients 64 years of age and older), is one component of a comprehensive surveillance program. It is not intended to diagnose infection nor to guide or monitor treatment. Performed at [facility], [place] [address] [place], [place] [address]   Aerobic/Anaerobic Culture w Gram Stain (surgical/deep wound)     Status: None (Preliminary result)   Collection Time: 01/06/21  8:46 PM   Specimen: Abscess  Result Value Ref Range Status   Specimen Description   Final    ABSCESS RIGHT HEEL Performed at [facility] [address] [place] [address] [place], [place] [address]    Special Requests   Final    NONE Performed at [facility], [place] [address] [place], [place] [address]    Gram Stain NO WBC SEEN RARE GRAM POSITIVE COCCI   Final   Culture   Final    NO GROWTH 3 DAYS NO ANAEROBES ISOLATED; CULTURE IN PROGRESS FOR 5 DAYS Performed at [facility], [address] [place],  [address]    Report Status PENDING  Incomplete         Radiology Studies: DG CHEST PORT 1 VIEW  Result Date: 01/10/2021 CLINICAL DATA:  65 year old male with shortness of breath EXAM: PORTABLE CHEST 1 VIEW COMPARISON:  01/04/2021, 12/28/2014 FINDINGS: Cardiomediastinal silhouette unchanged in size and contour. No pneumothorax. No pleural effusion. Coarsened interstitial markings similar to the prior plain film. Interlobular septal thickening bilaterally is more pronounced than the plain film of 2016. No new confluent airspace disease. No displaced fracture IMPRESSION: Persisting interlobular septal thickening from the prior plain film, new from the remote comparison 12/28/2014. This is most suggestive of persisting pulmonary edema although atypical infection could have this appearance. Electronically Signed   By: Corrie Mckusick D.O.   On: 01/10/2021 10:59   ECHOCARDIOGRAM COMPLETE  Result Date: 01/10/2021    ECHOCARDIOGRAM REPORT   Patient Name:   Fernando Anthony Date of Exam: 01/10/2021 Medical Rec #:  [id number]     Height:       74.0 in Accession #:    [id number]    Weight:       224.6 lb Date of Birth:  1955/12/30     BSA:          2.284 m Patient Age:    58  years      BP:           152/71 mmHg Patient Gender: M             HR:           63 bpm. Exam Location:  Inpatient Procedure: 2D Echo, Cardiac Doppler and Color Doppler Indications:     Pre-op examination  History:         Patient has no prior history of Echocardiogram examinations.                  Signs/Symptoms:Bacteremia.  Sonographer:     [name] RDCS Referring Phys:  [id number] Barb Merino Diagnosing Phys: Oswaldo Milian MD IMPRESSIONS  1. Left ventricular ejection fraction, by estimation, is 55 to 60%. The left ventricle has normal function. The left  ventricle has no regional wall motion abnormalities. There is mild left ventricular hypertrophy. Left ventricular diastolic parameters are indeterminate.  2. Right ventricular systolic function is normal. The right ventricular size is normal. There is moderately elevated pulmonary artery systolic pressure. The estimated right ventricular systolic pressure is [id number] mmHg.  3. Left atrial size was mildly dilated.  4. Right atrial size was mildly dilated.  5. The mitral valve is normal in structure. No evidence of mitral valve regurgitation.  6. The aortic valve was not well visualized. Aortic valve regurgitation is not visualized. No aortic stenosis is present.  7. The inferior vena cava is dilated in size with <50% respiratory variability, suggesting right atrial pressure of 15 mmHg. FINDINGS  Left Ventricle: Left ventricular ejection fraction, by estimation, is 55 to 60%. The left ventricle has normal function. The left ventricle has no regional wall motion abnormalities. The left ventricular internal cavity size was normal in size. There is  mild left ventricular hypertrophy. Left ventricular diastolic parameters are indeterminate. Right Ventricle: The right ventricular size is normal. No increase in right ventricular wall thickness. Right ventricular systolic function is normal. There is moderately elevated pulmonary artery systolic pressure. The  tricuspid regurgitant velocity is 2.87 m/s, and with an assumed right atrial pressure of 15 mmHg, the estimated right ventricular systolic pressure is [id number] mmHg. Left Atrium: Left atrial size was mildly dilated. Right Atrium: Right atrial size was mildly dilated. Pericardium: There is no evidence of pericardial effusion. Mitral Valve: The mitral valve is normal in structure. No evidence of mitral valve regurgitation. Tricuspid Valve: The tricuspid valve is normal in structure. Tricuspid valve regurgitation is trivial. Aortic Valve: The aortic valve was not well visualized. Aortic valve regurgitation is not visualized. No aortic stenosis is present. Aortic valve mean gradient measures 5.0 mmHg. Aortic valve peak gradient measures 9.9 mmHg. Aortic valve area, by VTI measures 2.98 cm. Pulmonic Valve: The pulmonic valve was not well visualized. Pulmonic valve regurgitation is not visualized. Aorta: The aortic root is normal in size and structure. Venous: The inferior vena cava is dilated in size with less than 50% respiratory variability, suggesting right atrial pressure of 15 mmHg. IAS/Shunts: No atrial level shunt detected by color flow Doppler.  LEFT VENTRICLE PLAX 2D LVIDd:         4.90 cm     Diastology LVIDs:         3.60 cm     LV e' medial:    8.27 cm/s LV PW:         1.20 cm     LV E/e' medial:  16.8 LV IVS:        1.00 cm     LV e' lateral:   9.14 cm/s LVOT diam:     2.20 cm     LV E/e' lateral: 15.2 LV SV:         89 LV SV Index:   39 LVOT Area:     3.80 cm  LV Volumes (MOD) LV vol d, MOD A4C: 96.3 ml LV vol s, MOD A4C: 42.7 ml LV SV MOD A4C:     96.3 ml RIGHT VENTRICLE          IVC RV Basal diam:  4.00 cm  IVC diam: 2.50 cm LEFT ATRIUM             Index       RIGHT ATRIUM           Index LA diam:  4.60 cm 2.01 cm/m  RA Area:     23.40 cm LA Vol (A2C):   71.8 ml 31.44 ml/m RA Volume:   85.10 ml  37.27 ml/m LA Vol (A4C):   87.9 ml 38.49 ml/m LA Biplane Vol: 87.1 ml 38.14 ml/m  AORTIC VALVE AV  Area (Vmax):    2.69 cm AV Area (Vmean):   2.88 cm AV Area (VTI):     2.98 cm AV Vmax:           157.00 cm/s AV Vmean:          99.800 cm/s AV VTI:            0.297 m AV Peak Grad:      9.9 mmHg AV Mean Grad:      5.0 mmHg LVOT Vmax:         111.00 cm/s LVOT Vmean:        75.700 cm/s LVOT VTI:          0.233 m LVOT/AV VTI ratio: 0.78  AORTA Ao Root diam: 3.50 cm MITRAL VALVE                TRICUSPID VALVE MV Area (PHT): 5.13 cm     TR Peak grad:   32.9 mmHg MV Decel Time: 148 msec     TR Vmax:        287.00 cm/s MV E velocity: 139.00 cm/s MV A velocity: 78.20 cm/s   SHUNTS MV E/A ratio:  1.78         Systemic VTI:  0.23 m                             Systemic Diam: 2.20 cm Oswaldo Milian MD Electronically signed by Oswaldo Milian MD Signature Date/Time: 01/10/2021/3:12:27 PM    Final (Updated)         Scheduled Meds: . amLODipine  10 mg Oral Daily  . aspirin EC  81 mg Oral QHS  . atenolol  50 mg Oral Daily  . chlorhexidine  60 mL Topical Once  . cloNIDine  0.1 mg Oral BID  . hydrALAZINE  50 mg Oral BID  . insulin aspart  0-15 Units Subcutaneous TID WC  . insulin aspart  0-5 Units Subcutaneous QHS  . insulin aspart  4 Units Subcutaneous TID WC  . insulin glargine  35 Units Subcutaneous QHS  . omega-3 acid ethyl esters  1 g Oral Daily  . povidone-iodine  2 application Topical Once  . pregabalin  50 mg Oral TID  . tamsulosin  0.4 mg Oral QPC supper   Continuous Infusions: .  ceFAZolin (ANCEF) IV    . cefTRIAXone (ROCEPHIN)  IV 2 g (01/10/21 2305)     LOS: 7 days    Time spent: 32 minutes     Barb Merino, MD [facility] Pager [phone]

## 2021-01-11 NOTE — Progress Notes (Signed)
Orthopedic Tech Progress Note Patient Details:  Fernando Anthony [birth year] [id number]  Patient ID: [name], male   DOB: 25-Oct-1956, 65 y.o.   MRN: [id number] Called order into [facility].  Karolee Stamps 01/11/2021, 6:37 AM

## 2021-01-11 NOTE — Op Note (Signed)
   Date of Surgery: 01/11/2021  INDICATIONS: Mr. Fernando Anthony is a 65 y.o.-year-old male who presents with osteomyelitis of the calcaneus.  Patient has failed conservative therapy and presents at this time for transtibial amputation.Marland Kitchen  PREOPERATIVE DIAGNOSIS: Osteomyelitis right calcaneus  POSTOPERATIVE DIAGNOSIS: Same.  PROCEDURE: Transtibial amputation Application of Prevena wound VAC  SURGEON: Fernando Anthony, M.D.  ANESTHESIA:  general  IV FLUIDS AND URINE: See anesthesia.  ESTIMATED BLOOD LOSS: 100 mL.  COMPLICATIONS: None.  DESCRIPTION OF PROCEDURE: The patient was brought to the operating room and underwent a general anesthetic. After adequate levels of anesthesia were obtained patient's lower extremity was prepped using DuraPrep draped into a sterile field. A timeout was called. The foot was draped out of the sterile field with impervious stockinette. A transverse incision was made 11 cm distal to the tibial tubercle. This curved proximally and a large posterior flap was created. The tibia was transected 1 cm proximal to the skin incision. The fibula was transected just proximal to the tibial incision. The tibia was beveled anteriorly. A large posterior flap was created. The sciatic nerve was pulled cut and allowed to retract. The vascular bundles were suture ligated with 2-0 silk. The deep and superficial fascial layers were closed using #1 Vicryl. The skin was closed using staples and 2-0 nylon. The wound was covered with a Prevena wound VAC. There was a good suction fit. A prosthetic shrinker will be applied. Patient was extubated taken to the PACU in stable condition.   DISCHARGE PLANNING:  Antibiotic duration: Continue antibiotics for 24 hours  Weightbearing: Nonweightbearing on the right  Pain medication: Opioid pathway  Dressing care/ Wound VAC: Continue wound VAC for 1 week  Discharge to: Inpatient versus outpatient rehab  Follow-up: In the office 1 week post operative.  Fernando Score, MD [place] 5:37 PM

## 2021-01-11 NOTE — Anesthesia Procedure Notes (Signed)
Procedure Name: MAC Date/Time: 01/11/2021 4:55 PM Performed by: Oletta Lamas, CRNA Pre-anesthesia Checklist: Patient identified, Emergency Drugs available, Suction available, Patient being monitored and Timeout performed Patient Re-evaluated:Patient Re-evaluated prior to induction Oxygen Delivery Method: Simple face mask

## 2021-01-11 NOTE — Anesthesia Procedure Notes (Signed)
Anesthesia Regional Block: Popliteal block   Pre-Anesthetic Checklist: ,, timeout performed, Correct Patient, Correct Site, Correct Laterality, Correct Procedure, Correct Position, site marked, Risks and benefits discussed,  Surgical consent,  Pre-op evaluation,  At surgeon's request and post-op pain management  Laterality: Right  Prep: chloraprep       Needles:  Injection technique: Single-shot  Needle Type: Echogenic Needle     Needle Length: 9cm  Needle Gauge: 21     Additional Needles:   Procedures:,,,, ultrasound used (permanent image in chart),,,,  Narrative:  Start time: 01/11/2021 3:45 PM End time: 01/11/2021 3:50 PM Injection made incrementally with aspirations every 5 mL.  Performed by: Personally  Anesthesiologist: Catalina Gravel, MD  Additional Notes: No pain on injection. No increased resistance to injection. Injection made in 5cc increments.  Good needle visualization.  Patient tolerated procedure well.

## 2021-01-11 NOTE — Transfer of Care (Signed)
Immediate Anesthesia Transfer of Care Note  Patient: Fernando Anthony  Procedure(s) Performed: AMPUTATION BELOW KNEE (Right Leg Lower) APPLICATION OF WOUND VAC (Right Leg Lower)  Patient Location: PACU  Anesthesia Type:General  Level of Consciousness: drowsy and patient cooperative  Airway & Oxygen Therapy: Patient Spontanous Breathing  Post-op Assessment: Report given to RN and Post -op Vital signs reviewed and stable  Post vital signs: Reviewed and stable  Last Vitals:  Vitals Value Taken Time  BP 114/84 01/11/21 2117  Temp 36.8 C 01/11/21 2044  Pulse 66 01/11/21 2117  Resp 9 01/11/21 2117  SpO2 94 % 01/11/21 2117  Vitals shown include unvalidated device data.  Last Pain:  Vitals:   01/11/21 2044  TempSrc: Oral  PainSc:       Patients Stated Pain Goal: 2 ([id number])  Complications: No complications documented.

## 2021-01-11 NOTE — Progress Notes (Signed)
Orthopedic Tech Progress Note Patient Details:  Fernando Anthony [birth year] [id number] Ordered patients shrinker Patient ID: Fernando Anthony, male   DOB: 18-Oct-1956, 65 y.o.   MRN: [id number]   Fernando Anthony 01/11/2021, 6:54 PM

## 2021-01-11 NOTE — Anesthesia Procedure Notes (Signed)
Anesthesia Regional Block: Adductor canal block   Pre-Anesthetic Checklist: ,, timeout performed, Correct Patient, Correct Site, Correct Laterality, Correct Procedure, Correct Position, site marked, Risks and benefits discussed,  Surgical consent,  Pre-op evaluation,  At surgeon's request and post-op pain management  Laterality: Right  Prep: chloraprep       Needles:  Injection technique: Single-shot  Needle Type: Echogenic Needle     Needle Length: 9cm  Needle Gauge: 21     Additional Needles:   Procedures:,,,, ultrasound used (permanent image in chart),,,,  Narrative:  Start time: 01/11/2021 3:50 PM End time: 01/11/2021 3:55 PM Injection made incrementally with aspirations every 5 mL.  Performed by: Personally  Anesthesiologist: Catalina Gravel, MD  Additional Notes: No pain on injection. No increased resistance to injection. Injection made in 5cc increments.  Good needle visualization.  Patient tolerated procedure well.

## 2021-01-11 NOTE — Anesthesia Procedure Notes (Signed)
Procedure Name: LMA Insertion Date/Time: 01/11/2021 5:02 PM Performed by: Oletta Lamas, CRNA Pre-anesthesia Checklist: Patient identified, Emergency Drugs available, Suction available and Patient being monitored Patient Re-evaluated:Patient Re-evaluated prior to induction Oxygen Delivery Method: Circle System Utilized Preoxygenation: Pre-oxygenation with 100% oxygen Induction Type: IV induction Ventilation: Mask ventilation without difficulty LMA: LMA inserted LMA Size: 4.0 Number of attempts: 1 Placement Confirmation: positive ETCO2 Tube secured with: Tape Dental Injury: Teeth and Oropharynx as per pre-operative assessment

## 2021-01-11 NOTE — Consult Note (Signed)
[facility]  Total days of antibiotics 8               Reason for Consult: citrobacter bacteremia    Referring Physician: ghimire  Principal Problem:   Osteomyelitis ([place]) Active Problems:   Uncontrolled diabetes mellitus with diabetic nephropathy ([place])   Hypertension, essential, benign   Abnormal LFTs   Hyponatremia   Hyperlipidemia associated with type 2 diabetes mellitus ([place])   AKI (acute kidney injury) ([place])   Anemia   Acute osteomyelitis of right calcaneus (HCC)   Severe protein-calorie malnutrition (HCC)    HPI: Fernando Anthony is a 65 y.o. male with hx of IDDM with diabetic nephropathy, DFU/osteomyelitis s/p right TMA who was admitted on 2/14 for concner for right plantar l ulcer that had increasing swelling, purulent drainage and pain to his RLE. He was afebrile but did have leukocytosis of 25K plus LA 26 with acute kidney injury of AKI near 4, as well as transaminitis. His infectious work up revealed group G streptococcus on wound cx but deep cx found citrobacter koseri, which was also found in 1 of 4 bottles of blood cx. He was started on ceftriaxone plus daptomycin initially then narrowed to ceftriaxone.his MRI of right foot showing evidence of osteomyelitis He underwent I x D on 2/16 by dr Amalia Hailey, however still felt that he would not have significant healing. He is to under trans tibial amputation this afternoon. Patient notices having loose stools 3-4 per day with antibiotics. He has not had recently since he has been NPO for surgery. Usually has postprandial BM.  Past Medical History:  Diagnosis Date  . Back pain   . Diabetes mellitus   . Headache(784.0)    general  . Hyperlipidemia   . Hypertension   . Osteomyelitis ([place]) 11/2018   RIGHT FOOT    Allergies:  Allergies  Allergen Reactions  . Vicodin [Hydrocodone-Acetaminophen] Itching    MEDICATIONS: . [MAR Hold] amLODipine  10 mg Oral Daily  . [MAR Hold] aspirin EC  81 mg Oral QHS   . [MAR Hold] atenolol  50 mg Oral Daily  . chlorhexidine  60 mL Topical Once  . [MAR Hold] cloNIDine  0.1 mg Oral BID  . fentaNYL      . fentaNYL (SUBLIMAZE) injection  50 mcg Intravenous Once  . [MAR Hold] hydrALAZINE  50 mg Oral BID  . [MAR Hold] insulin aspart  0-15 Units Subcutaneous TID WC  . [MAR Hold] insulin aspart  0-5 Units Subcutaneous QHS  . [MAR Hold] insulin aspart  4 Units Subcutaneous TID WC  . [MAR Hold] insulin glargine  35 Units Subcutaneous QHS  . midazolam      . midazolam  2 mg Intravenous Once  . [MAR Hold] omega-3 acid ethyl esters  1 g Oral Daily  . povidone-iodine  2 application Topical Once  . [MAR Hold] pregabalin  50 mg Oral TID  . [MAR Hold] tamsulosin  0.4 mg Oral QPC supper    Social History   Tobacco Use  . Smoking status: Current Every Day Smoker    Packs/day: 1.00    Years: 40.00    Pack years: 40.00    Types: Cigarettes  . Smokeless tobacco: Never Used  Vaping Use  . Vaping Use: Former  Substance Use Topics  . Alcohol use: Not Currently  . Drug use: Not Currently    Comment: past use of marijuana    Family History  Problem Relation Age of  Onset  . Cancer Mother   . Heart disease Father     Review of Systems -  Constitutional: Negative for fever, chills, diaphoresis, activity change, appetite change, fatigue and unexpected weight change.  HENT: Negative for congestion, sore throat, rhinorrhea, sneezing, trouble swallowing and sinus pressure.  Eyes: Negative for photophobia and visual disturbance.  Respiratory: Negative for cough, chest tightness, shortness of breath, wheezing and stridor.  Cardiovascular: Negative for chest pain, palpitations and leg swelling.  Gastrointestinal: +loose stools. Negative for nausea, vomiting, abdominal pain, diarrhea, constipation, blood in stool, abdominal distention and anal bleeding.  Genitourinary: Negative for dysuria, hematuria, flank pain and difficulty urinating.  Musculoskeletal: Negative  for myalgias, back pain, joint swelling, arthralgias and gait problem.  Skin: +right foot wound Negative for color change, pallor, rash and wound.  Neurological: Negative for dizziness, tremors, weakness and light-headedness.  Hematological: Negative for adenopathy. Does not bruise/bleed easily.  Psychiatric/Behavioral: Negative for behavioral problems, confusion, sleep disturbance, dysphoric mood, decreased concentration and agitation.     OBJECTIVE: Temp:  [97.8 F (36.6 C)-98.6 F (37 C)] 97.8 F (36.6 C) (02/21 0924) Pulse Rate:  [61-69] 63 (02/21 1555) Resp:  [10-18] 12 (02/21 1555) BP: (154-159)/(72-78) 154/72 (02/21 0924) SpO2:  [94 %-97 %] 96 % (02/21 1555) Weight:  [101.9 kg] 101.9 kg (02/21 1509) Physical Exam  Constitutional: He is oriented to person, place, and time. He appears well-developed and well-nourished. No distress.  HENT:  Mouth/Throat: Oropharynx is clear and moist. No oropharyngeal exudate.  Cardiovascular: Normal rate, regular rhythm and normal heart sounds. Exam reveals no gallop and no friction rub.  No murmur heard.  Pulmonary/Chest: Effort normal and breath sounds normal. No respiratory distress. He has no wheezes.  Abdominal: Soft. Bowel sounds are normal. He exhibits no distension. There is no tenderness.  Lymphadenopathy:  He has no cervical adenopathy.  Neurological: He is alert and oriented to person, place, and time.  [id number] foot wrapped, some of the area is blood soaked. Skin: Skin is warm and dry. No rash noted. No erythema.  Psychiatric: He has a normal mood and affect. His behavior is normal.     LABS: Results for orders placed or performed during the hospital encounter of 01/04/21 (from the past 48 hour(s))  Glucose, capillary     Status: Abnormal   Collection Time: 01/09/21  4:13 PM  Result Value Ref Range   Glucose-Capillary 111 (H) 70 - 99 mg/dL    Comment: Glucose reference range applies only to samples taken after fasting for at  least 8 hours.  Glucose, capillary     Status: None   Collection Time: 01/09/21  8:08 PM  Result Value Ref Range   Glucose-Capillary 89 70 - 99 mg/dL    Comment: Glucose reference range applies only to samples taken after fasting for at least 8 hours.  Renal function panel     Status: Abnormal   Collection Time: 01/10/21  3:06 AM  Result Value Ref Range   Sodium 133 (L) 135 - 145 mmol/L   Potassium 4.3 3.5 - 5.1 mmol/L   Chloride 106 98 - 111 mmol/L   CO2 15 (L) 22 - 32 mmol/L   Glucose, Bld 143 (H) 70 - 99 mg/dL    Comment: Glucose reference range applies only to samples taken after fasting for at least 8 hours.   BUN 62 (H) 8 - 23 mg/dL   Creatinine, Ser 5.77 (H) 0.61 - 1.24 mg/dL   Calcium 7.9 (L) 8.9 - 10.3 mg/dL  Phosphorus 5.8 (H) 2.5 - 4.6 mg/dL   Albumin 1.5 (L) 3.5 - 5.0 g/dL   GFR, Estimated 10 (L) >60 mL/min    Comment: (NOTE) Calculated using the CKD-EPI Creatinine Equation (2021)    Anion gap 12 5 - 15    Comment: Performed at [facility] [address], [place], [place] [address]  Magnesium     Status: None   Collection Time: 01/10/21  3:06 AM  Result Value Ref Range   Magnesium 1.7 1.7 - 2.4 mg/dL    Comment: Performed at [facility] [address], [place], [place] [address]  CBC with Differential/Platelet     Status: Abnormal   Collection Time: 01/10/21  3:06 AM  Result Value Ref Range   WBC 14.6 (H) 4.0 - 10.5 K/uL   RBC 2.95 (L) 4.22 - 5.81 MIL/uL   Hemoglobin 9.2 (L) 13.0 - 17.0 g/dL   HCT 25.7 (L) 39.0 - 52.0 %   MCV 87.1 80.0 - 100.0 fL   MCH 31.2 26.0 - 34.0 pg   MCHC 35.8 30.0 - 36.0 g/dL   RDW 13.8 11.5 - 15.5 %   Platelets 240 150 - 400 K/uL   nRBC 0.0 0.0 - 0.2 %   Neutrophils Relative % 74 %   Neutro Abs 10.7 (H) 1.7 - 7.7 K/uL   Lymphocytes Relative 9 %   Lymphs Abs 1.4 0.7 - 4.0 K/uL   Monocytes Relative 13 %   Monocytes Absolute 1.9 (H) 0.1 - 1.0 K/uL   Eosinophils Relative 1 %   Eosinophils Absolute 0.2 0.0 - 0.5 K/uL    Basophils Relative 0 %   Basophils Absolute 0.0 0.0 - 0.1 K/uL   Immature Granulocytes 3 %   Abs Immature Granulocytes 0.45 (H) 0.00 - 0.07 K/uL    Comment: Performed at [facility], [address], [place], [place] [address]  ABO/Rh     Status: None   Collection Time: 01/10/21  3:06 AM  Result Value Ref Range   ABO/RH(D)      O POS Performed at [facility] [address] [place], [place] [address]   Glucose, capillary     Status: Abnormal   Collection Time: 01/10/21  6:40 AM  Result Value Ref Range   Glucose-Capillary 154 (H) 70 - 99 mg/dL    Comment: Glucose reference range applies only to samples taken after fasting for at least 8 hours.  Type and screen [place]     Status: None   Collection Time: 01/10/21  9:58 AM  Result Value Ref Range   ABO/RH(D) O POS    Antibody Screen NEG    Sample Expiration 01/13/2021,2359    Unit Number [id number]    Blood Component Type RED CELLS,LR    Unit division 00    Status of Unit ISSUED,FINAL    Transfusion Status OK TO TRANSFUSE    Crossmatch Result      Compatible Performed at [facility], [place] [address], [place], [place] [address]   Prepare RBC (crossmatch)     Status: None   Collection Time: 01/10/21  9:58 AM  Result Value Ref Range   Order Confirmation      ORDER PROCESSED BY BLOOD BANK Performed at [facility], [place] [address], [place],  [address]   Glucose, capillary     Status: None   Collection Time: 01/10/21 12:25 PM  Result Value Ref Range  Glucose-Capillary 92 70 - 99 mg/dL    Comment: Glucose reference range applies only to samples taken after fasting for at least 8 hours.  Glucose, capillary     Status: Abnormal   Collection Time: 01/10/21  4:07 PM  Result Value Ref Range   Glucose-Capillary 115 (H) 70 - 99 mg/dL    Comment: Glucose reference range applies only to samples taken after fasting for at least 8 hours.  Hemoglobin and hematocrit, blood      Status: Abnormal   Collection Time: 01/10/21  7:38 PM  Result Value Ref Range   Hemoglobin 9.7 (L) 13.0 - 17.0 g/dL   HCT 28.3 (L) 39.0 - 52.0 %    Comment: Performed at [facility], [place] [address], [place], [place] [address]  Glucose, capillary     Status: None   Collection Time: 01/10/21 10:17 PM  Result Value Ref Range   Glucose-Capillary 95 70 - 99 mg/dL    Comment: Glucose reference range applies only to samples taken after fasting for at least 8 hours.  Comprehensive metabolic panel     Status: Abnormal   Collection Time: 01/11/21  3:06 AM  Result Value Ref Range   Sodium 133 (L) 135 - 145 mmol/L   Potassium 4.2 3.5 - 5.1 mmol/L   Chloride 107 98 - 111 mmol/L   CO2 16 (L) 22 - 32 mmol/L   Glucose, Bld 154 (H) 70 - 99 mg/dL    Comment: Glucose reference range applies only to samples taken after fasting for at least 8 hours.   BUN 63 (H) 8 - 23 mg/dL   Creatinine, Ser 5.96 (H) 0.61 - 1.24 mg/dL   Calcium 7.9 (L) 8.9 - 10.3 mg/dL   Total Protein 5.5 (L) 6.5 - 8.1 g/dL   Albumin 1.5 (L) 3.5 - 5.0 g/dL   AST 90 (H) 15 - 41 U/L   ALT 118 (H) 0 - 44 U/L   Alkaline Phosphatase 600 (H) 38 - 126 U/L   Total Bilirubin 4.4 (H) 0.3 - 1.2 mg/dL   GFR, Estimated 10 (L) >60 mL/min    Comment: (NOTE) Calculated using the CKD-EPI Creatinine Equation (2021)    Anion gap 10 5 - 15    Comment: Performed at [facility], [place] [address], [place], [place] [address]  CBC with Differential/Platelet     Status: Abnormal   Collection Time: 01/11/21  3:06 AM  Result Value Ref Range   WBC 11.8 (H) 4.0 - 10.5 K/uL   RBC 3.01 (L) 4.22 - 5.81 MIL/uL   Hemoglobin 9.2 (L) 13.0 - 17.0 g/dL   HCT 26.5 (L) 39.0 - 52.0 %   MCV 88.0 80.0 - 100.0 fL   MCH 30.6 26.0 - 34.0 pg   MCHC 34.7 30.0 - 36.0 g/dL   RDW 14.6 11.5 - 15.5 %   Platelets 215 150 - 400 K/uL   nRBC 0.0 0.0 - 0.2 %   Neutrophils Relative % 69 %   Neutro Abs 8.1 (H) 1.7 - 7.7 K/uL   Lymphocytes Relative 10 %   Lymphs Abs  1.2 0.7 - 4.0 K/uL   Monocytes Relative 14 %   Monocytes Absolute 1.7 (H) 0.1 - 1.0 K/uL   Eosinophils Relative 2 %   Eosinophils Absolute 0.2 0.0 - 0.5 K/uL   Basophils Relative 0 %   Basophils Absolute 0.0 0.0 - 0.1 K/uL   Immature Granulocytes 5 %   Abs Immature Granulocytes 0.60 (H) 0.00 -  0.07 K/uL    Comment: Performed at [facility], [place] [address], [place], [place] [address]  Magnesium     Status: Abnormal   Collection Time: 01/11/21  3:06 AM  Result Value Ref Range   Magnesium 1.6 (L) 1.7 - 2.4 mg/dL    Comment: Performed at [facility] [address], [place], [place] [address]  Phosphorus     Status: Abnormal   Collection Time: 01/11/21  3:06 AM  Result Value Ref Range   Phosphorus 6.1 (H) 2.5 - 4.6 mg/dL    Comment: Performed at [facility] [address] [place], [place] [address]  Glucose, capillary     Status: Abnormal   Collection Time: 01/11/21  9:29 AM  Result Value Ref Range   Glucose-Capillary 115 (H) 70 - 99 mg/dL    Comment: Glucose reference range applies only to samples taken after fasting for at least 8 hours.  Glucose, capillary     Status: Abnormal   Collection Time: 01/11/21 12:06 PM  Result Value Ref Range   Glucose-Capillary 132 (H) 70 - 99 mg/dL    Comment: Glucose reference range applies only to samples taken after fasting for at least 8 hours.  Glucose, capillary     Status: Abnormal   Collection Time: 01/11/21  2:55 PM  Result Value Ref Range   Glucose-Capillary 145 (H) 70 - 99 mg/dL    Comment: Glucose reference range applies only to samples taken after fasting for at least 8 hours.    MICRO: Reviewed. Citrobacter koseri      MIC    CEFAZOLIN <=4 SENSITIVE  Sensitive    CEFEPIME <=0.12 SENS... Sensitive    CEFTAZIDIME <=1 SENSITIVE  Sensitive    CEFTRIAXONE <=0.25 SENS... Sensitive    CIPROFLOXACIN <=0.25 SENS... Sensitive    GENTAMICIN <=1 SENSITIVE  Sensitive    IMIPENEM <=0.25 SENS... Sensitive    PIP/TAZO 8  SENSITIVE  Sensitive    TRIMETH/SULFA <=20 SENSIT... Sensitive     IMAGING: DG CHEST PORT 1 VIEW  Result Date: 01/10/2021 CLINICAL DATA:  65 year old male with shortness of breath EXAM: PORTABLE CHEST 1 VIEW COMPARISON:  01/04/2021, 12/28/2014 FINDINGS: Cardiomediastinal silhouette unchanged in size and contour. No pneumothorax. No pleural effusion. Coarsened interstitial markings similar to the prior plain film. Interlobular septal thickening bilaterally is more pronounced than the plain film of 2016. No new confluent airspace disease. No displaced fracture IMPRESSION: Persisting interlobular septal thickening from the prior plain film, new from the remote comparison 12/28/2014. This is most suggestive of persisting pulmonary edema although atypical infection could have this appearance. Electronically Signed   By: Corrie Mckusick D.O.   On: 01/10/2021 10:59   ECHOCARDIOGRAM COMPLETE  Result Date: 01/10/2021    ECHOCARDIOGRAM REPORT   Patient Name:   Fernando Anthony Date of Exam: 01/10/2021 Medical Rec #:  [id number]     Height:       74.0 in Accession #:    [id number]    Weight:       224.6 lb Date of Birth:  Dec 25, 1955     BSA:          2.284 m Patient Age:    21 years      BP:           152/71 mmHg Patient Gender: M             HR:           63 bpm. Exam  Location:  Inpatient Procedure: 2D Echo, Cardiac Doppler and Color Doppler Indications:     Pre-op examination  History:         Patient has no prior history of Echocardiogram examinations.                  Signs/Symptoms:Bacteremia.  Sonographer:     [name] RDCS Referring Phys:  [id number] Barb Merino Diagnosing Phys: Oswaldo Milian MD IMPRESSIONS  1. Left ventricular ejection fraction, by estimation, is 55 to 60%. The left ventricle has normal function. The left ventricle has no regional wall motion abnormalities. There is mild left ventricular hypertrophy. Left ventricular diastolic parameters are indeterminate.  2. Right ventricular systolic  function is normal. The right ventricular size is normal. There is moderately elevated pulmonary artery systolic pressure. The estimated right ventricular systolic pressure is [id number] mmHg.  3. Left atrial size was mildly dilated.  4. Right atrial size was mildly dilated.  5. The mitral valve is normal in structure. No evidence of mitral valve regurgitation.  6. The aortic valve was not well visualized. Aortic valve regurgitation is not visualized. No aortic stenosis is present.  7. The inferior vena cava is dilated in size with <50% respiratory variability, suggesting right atrial pressure of 15 mmHg. FINDINGS  Left Ventricle: Left ventricular ejection fraction, by estimation, is 55 to 60%. The left ventricle has normal function. The left ventricle has no regional wall motion abnormalities. The left ventricular internal cavity size was normal in size. There is  mild left ventricular hypertrophy. Left ventricular diastolic parameters are indeterminate. Right Ventricle: The right ventricular size is normal. No increase in right ventricular wall thickness. Right ventricular systolic function is normal. There is moderately elevated pulmonary artery systolic pressure. The tricuspid regurgitant velocity is 2.87 m/s, and with an assumed right atrial pressure of 15 mmHg, the estimated right ventricular systolic pressure is [id number] mmHg. Left Atrium: Left atrial size was mildly dilated. Right Atrium: Right atrial size was mildly dilated. Pericardium: There is no evidence of pericardial effusion. Mitral Valve: The mitral valve is normal in structure. No evidence of mitral valve regurgitation. Tricuspid Valve: The tricuspid valve is normal in structure. Tricuspid valve regurgitation is trivial. Aortic Valve: The aortic valve was not well visualized. Aortic valve regurgitation is not visualized. No aortic stenosis is present. Aortic valve mean gradient measures 5.0 mmHg. Aortic valve peak gradient measures 9.9 mmHg. Aortic valve  area, by VTI measures 2.98 cm. Pulmonic Valve: The pulmonic valve was not well visualized. Pulmonic valve regurgitation is not visualized. Aorta: The aortic root is normal in size and structure. Venous: The inferior vena cava is dilated in size with less than 50% respiratory variability, suggesting right atrial pressure of 15 mmHg. IAS/Shunts: No atrial level shunt detected by color flow Doppler.  LEFT VENTRICLE PLAX 2D LVIDd:         4.90 cm     Diastology LVIDs:         3.60 cm     LV e' medial:    8.27 cm/s LV PW:         1.20 cm     LV E/e' medial:  16.8 LV IVS:        1.00 cm     LV e' lateral:   9.14 cm/s LVOT diam:     2.20 cm     LV E/e' lateral: 15.2 LV SV:         89 LV SV Index:   39 LVOT Area:  3.80 cm  LV Volumes (MOD) LV vol d, MOD A4C: 96.3 ml LV vol s, MOD A4C: 42.7 ml LV SV MOD A4C:     96.3 ml RIGHT VENTRICLE          IVC RV Basal diam:  4.00 cm  IVC diam: 2.50 cm LEFT ATRIUM             Index       RIGHT ATRIUM           Index LA diam:        4.60 cm 2.01 cm/m  RA Area:     23.40 cm LA Vol (A2C):   71.8 ml 31.44 ml/m RA Volume:   85.10 ml  37.27 ml/m LA Vol (A4C):   87.9 ml 38.49 ml/m LA Biplane Vol: 87.1 ml 38.14 ml/m  AORTIC VALVE AV Area (Vmax):    2.69 cm AV Area (Vmean):   2.88 cm AV Area (VTI):     2.98 cm AV Vmax:           157.00 cm/s AV Vmean:          99.800 cm/s AV VTI:            0.297 m AV Peak Grad:      9.9 mmHg AV Mean Grad:      5.0 mmHg LVOT Vmax:         111.00 cm/s LVOT Vmean:        75.700 cm/s LVOT VTI:          0.233 m LVOT/AV VTI ratio: 0.78  AORTA Ao Root diam: 3.50 cm MITRAL VALVE                TRICUSPID VALVE MV Area (PHT): 5.13 cm     TR Peak grad:   32.9 mmHg MV Decel Time: 148 msec     TR Vmax:        287.00 cm/s MV E velocity: 139.00 cm/s MV A velocity: 78.20 cm/s   SHUNTS MV E/A ratio:  1.78         Systemic VTI:  0.23 m                             Systemic Diam: 2.20 cm Oswaldo Milian MD Electronically signed by Oswaldo Milian MD  Signature Date/Time: 01/10/2021/3:12:27 PM    Final (Updated)    Assessment/Plan:  65yo M with IDDM, diabetic nephropathy, DFU/osteomyelitis of right foot admitted for worsening wound/early osteomyelitis with secondary citrobacter bacteremia. Currently on day 7 of Iv abtx since positive culture  - recommend to continue on ceftriaxone for 3 additional days, to complete 10 day course of treatment. Can convert to orals (cephalosporins) if ready to discharge home before completion of abtx.  Leukocytosis = anticipate to improve with source control today  transaminitis with hyperbilirubinemia = concern for non-viral hepatitis/cirrhosis. U/S showing mild ascites. Recommend to repeat PT/INR. May need outpatient follow-up.   Elzie Rings [facility] [phone]

## 2021-01-11 NOTE — Progress Notes (Signed)
[facility] Progress Note   Subjective:  Seen at bedside. Foot pain overnight. Plans for transtibial amputation today.  He keeps a log of his UOP and recorded about 2L output yesterday  Feels like swelling is improving but does endorse some DOE.   Objective Vitals:   01/10/21 1549 01/10/21 2000 01/11/21 0200 01/11/21 0924  BP: (!) 145/85 (!) 159/78 (!) 155/76 (!) 154/72  Pulse: 63 69 64 61  Resp: '18 16 16   '$ Temp: 98.4 F (36.9 C) 98.6 F (37 C) 98.4 F (36.9 C) 97.8 F (36.6 C)  TempSrc: Oral Oral  Oral  SpO2: 94% 94% 96% 96%  Weight:      Height:         Additional Objective Labs: Basic Metabolic Panel: Recent Labs  Lab 01/09/21 0416 01/10/21 0306 01/11/21 0306  NA 135 133* 133*  K 3.7 4.3 4.2  CL 107 106 107  CO2 18* 15* 16*  GLUCOSE 175* 143* 154*  BUN 67* 62* 63*  CREATININE 5.70* 5.77* 5.96*  CALCIUM 7.6* 7.9* 7.9*  PHOS 5.6* 5.8* 6.1*   CBC: Recent Labs  Lab 01/07/21 0325 01/08/21 0309 01/09/21 0416 01/10/21 0306 01/10/21 1938 01/11/21 0306  WBC 12.1* 15.8* 12.8* 14.6*  --  11.8*  NEUTROABS 10.9* 12.4*  --  10.7*  --  8.1*  HGB 9.4* 9.3* 9.6* 9.2* 9.7* 9.2*  HCT 28.2* 27.6* 28.7* 25.7* 28.3* 26.5*  MCV 92.2 93.2 92.0 87.1  --  88.0  PLT 202 200 217 240  --  215   Blood Culture    Component Value Date/Time   SDES  01/06/2021 2046    ABSCESS RIGHT HEEL Performed at [facility], [place] [address], [place], [place] [address]    SPECREQUEST  01/06/2021 2046    NONE Performed at [facility], [place] [address], [place], [place] [address]    CULT  01/06/2021 2046    NO GROWTH 3 DAYS NO ANAEROBES ISOLATED; CULTURE IN PROGRESS FOR 5 DAYS Performed at [facility] [address] [place], [place] [address]    REPTSTATUS PENDING 01/06/2021 2046     Physical Exam General: Well appearing, nad  Heart: RRR  Lungs: Clear bilaterally  Abdomen: soft non-tender  Extremities:  1-2+ bilat LE  edema, R foot in bandage  Neuro: A &O x3, non focal    Medications: .  ceFAZolin (ANCEF) IV    . cefTRIAXone (ROCEPHIN)  IV 2 g (01/10/21 2305)   . amLODipine  10 mg Oral Daily  . aspirin EC  81 mg Oral QHS  . atenolol  50 mg Oral Daily  . chlorhexidine  60 mL Topical Once  . cloNIDine  0.1 mg Oral BID  . hydrALAZINE  50 mg Oral BID  . insulin aspart  0-15 Units Subcutaneous TID WC  . insulin aspart  0-5 Units Subcutaneous QHS  . insulin aspart  4 Units Subcutaneous TID WC  . insulin glargine  35 Units Subcutaneous QHS  . omega-3 acid ethyl esters  1 g Oral Daily  . povidone-iodine  2 application Topical Once  . pregabalin  50 mg Oral TID  . tamsulosin  0.4 mg Oral QPC supper     Assessment/Plan: 1. AKI: Nonoliguric and suspected to have hemodynamically mediated ATN. Nonoliguric and suspected to have hemodynamically mediated ATN. Lisinopril, HCTZ discontinued. Renal ultrasound consistent with medical renal disease. Maintaining robust UOP. Got Lasix 40 x1 yesterday for volume removal. BUN/Cr bump today. No  urgent indications for dialysis. Surgery today. Continue strict I/Os.  2. Right foot osteomyelitis:  S/p debridement. On Rocephin. For transtibial amputation per ortho.  3. HTN: Not quite at goal. Lisinopril/HCTZ held. Tamsulosin started. Continue atenolol, clonidine, amlodipine.  4. Anemia-  Secondary to chronic illness. Tsat low, holding IV Fe in the setting of active infection  6. DM - Insulin per primary team.   Lynnda Child PA-C [facility] 01/11/2021,11:29 AM

## 2021-01-12 ENCOUNTER — Encounter (HOSPITAL_COMMUNITY): Payer: Self-pay | Admitting: Orthopedic Surgery

## 2021-01-12 DIAGNOSIS — E1121 Type 2 diabetes mellitus with diabetic nephropathy: Secondary | ICD-10-CM | POA: Diagnosis not present

## 2021-01-12 DIAGNOSIS — N179 Acute kidney failure, unspecified: Secondary | ICD-10-CM | POA: Diagnosis not present

## 2021-01-12 DIAGNOSIS — R945 Abnormal results of liver function studies: Secondary | ICD-10-CM | POA: Diagnosis not present

## 2021-01-12 DIAGNOSIS — M86171 Other acute osteomyelitis, right ankle and foot: Secondary | ICD-10-CM | POA: Diagnosis not present

## 2021-01-12 LAB — COMPREHENSIVE METABOLIC PANEL
ALT: 120 U/L — ABNORMAL HIGH (ref 0–44)
AST: 98 U/L — ABNORMAL HIGH (ref 15–41)
Albumin: 1.5 g/dL — ABNORMAL LOW (ref 3.5–5.0)
Alkaline Phosphatase: 716 U/L — ABNORMAL HIGH (ref 38–126)
Anion gap: 14 (ref 5–15)
BUN: 64 mg/dL — ABNORMAL HIGH (ref 8–23)
CO2: 13 mmol/L — ABNORMAL LOW (ref 22–32)
Calcium: 7.9 mg/dL — ABNORMAL LOW (ref 8.9–10.3)
Chloride: 103 mmol/L (ref 98–111)
Creatinine, Ser: 5.86 mg/dL — ABNORMAL HIGH (ref 0.61–1.24)
GFR, Estimated: 10 mL/min — ABNORMAL LOW (ref >60)
Glucose, Bld: 354 mg/dL — ABNORMAL HIGH (ref 70–99)
Potassium: 4.6 mmol/L (ref 3.5–5.1)
Sodium: 130 mmol/L — ABNORMAL LOW (ref 135–145)
Total Bilirubin: 3.8 mg/dL — ABNORMAL HIGH (ref 0.3–1.2)
Total Protein: 5.8 g/dL — ABNORMAL LOW (ref 6.5–8.1)

## 2021-01-12 LAB — PROTEIN ELECTROPHORESIS, SERUM
A/G Ratio: 0.5 — ABNORMAL LOW (ref 0.7–1.7)
Albumin ELP: 1.9 g/dL — ABNORMAL LOW (ref 2.9–4.4)
Alpha-1-Globulin: 0.4 g/dL (ref 0.0–0.4)
Alpha-2-Globulin: 1.3 g/dL — ABNORMAL HIGH (ref 0.4–1.0)
Beta Globulin: 1 g/dL (ref 0.7–1.3)
Gamma Globulin: 1.1 g/dL (ref 0.4–1.8)
Globulin, Total: 3.7 g/dL (ref 2.2–3.9)
Total Protein ELP: 5.6 g/dL — ABNORMAL LOW (ref 6.0–8.5)

## 2021-01-12 LAB — AEROBIC/ANAEROBIC CULTURE W GRAM STAIN (SURGICAL/DEEP WOUND)
Culture: NO GROWTH
Gram Stain: NONE SEEN

## 2021-01-12 LAB — GLUCOSE, CAPILLARY
Glucose-Capillary: 368 mg/dL — ABNORMAL HIGH (ref 70–99)
Glucose-Capillary: 288 mg/dL — ABNORMAL HIGH (ref 70–99)
Glucose-Capillary: 211 mg/dL — ABNORMAL HIGH (ref 70–99)
Glucose-Capillary: 163 mg/dL — ABNORMAL HIGH (ref 70–99)

## 2021-01-12 MED ORDER — POLYETHYLENE GLYCOL 3350 17 G PO PACK: 17.0000 g | PACK | Freq: Every day | ORAL | Status: DC | PRN

## 2021-01-12 NOTE — Progress Notes (Signed)
Rehab Admissions Coordinator Note:  Patient was screened by Cleatrice Burke for appropriateness for an Inpatient Acute Rehab Consult per therapy recommendations.  At this time, we are recommending Inpatient Rehab consult to assess for candidacy. I will place order per protocol.  Cleatrice Burke RN MSN 01/12/2021, 11:47 AM  I can be reached at [phone].

## 2021-01-12 NOTE — Plan of Care (Signed)
  Problem: Education: Goal: Knowledge of General Education information will improve Description: Including pain rating scale, medication(s)/side effects and non-pharmacologic comfort measures Outcome: Progressing   Problem: Clinical Measurements: Goal: Respiratory complications will improve Outcome: Progressing   Problem: Activity: Goal: Risk for activity intolerance will decrease Outcome: Progressing   Problem: Nutrition: Goal: Adequate nutrition will be maintained Outcome: Progressing   Problem: Coping: Goal: Level of anxiety will decrease Outcome: Progressing   Problem: Safety: Goal: Ability to remain free from injury will improve Outcome: Progressing   

## 2021-01-12 NOTE — Plan of Care (Signed)

## 2021-01-12 NOTE — Progress Notes (Signed)
PROGRESS NOTE    Fernando Anthony  [id number] DOB: 1956/04/30 DOA: 01/04/2021 PCP: Curly Rim, MD    Brief Narrative:  65 year old gentleman with insulin-dependent diabetes, diabetic foot ulcer right with multiple procedures in the past, hypertension hyperlipidemia who was following at podiatry office for long time and had multiple debridements presented with calcaneal osteomyelitis.  Admitted to the hospital for surgical debridement.  Persistent infection so needing amputation to clean margin and transferred to [facility].  Patient also developed acute kidney injury and nephrology is following.   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 Start working with PT OT.  Will refer to inpatient rehab. Daptomycin 2/14-2/18 Rocephin 2/14--ongoing.  Will treat with total 10 days of therapy.  Acute kidney injury with azotemia: Baseline creatinine 1.2.  Presented with significant worsening renal functions. Urine output is adequate.  Ultrasound of the kidneys consistent with medical renal disease, no evidence of hydronephrosis. Avoiding all nephrotoxins.  change morphine to Dilaudid. Followed by nephrology.  Patient had urinary retention after surgery yesterday, relieved now.  Uncontrolled type 2 diabetes with hyperglycemia and diabetic foot infection: Currently blood sugars are well controlled on Lantus 35 units, prandial insulin 4  units and sliding scale insulin.  A1c is more than 10.  We will continue to titrate insulin regimen.  Currently stable.  Essential hypertension: Stable on amlodipine, atenolol, clonidine and hydralazine.  Discontinued hydrochlorothiazide and lisinopril due to AKI.  Abnormal LFTs: Bilirubin is 4.  Transaminases improving.  Probably due to acute illness.  May have suffered mild shock liver secondary to acute infection and hypovolemia.   Right upper quadrant ultrasound showed some fluid around the gallbladder but no evidence of a stone.  CBD was 3 mm.   Patient never had any abdominal pain.  Unlikely acalculus cholecystitis.  We will continue to monitor to ensure stabilization.  DVT prophylaxis: SCDs Start: 01/11/21 1823 Place TED hose Start: 01/10/21 1121 SCDs Start: 01/06/21 2227 SCDs Start: 01/04/21 1945   Code Status: Full code Family Communication: Wife at the bedside  Disposition Plan: Status is: Inpatient  Remains inpatient appropriate because:IV treatments appropriate due to intensity of illness or inability to take PO and Inpatient level of care appropriate due to severity of illness   Dispo: The patient is from: Home              Anticipated d/c is to: [facility]              Anticipated d/c date is: 2 days               Patient currently is not medically stable to d/c.   Difficult to place patient No         Consultants:   Orthopedics  Nephrology  Podiatry  Procedures:   Right calcaneal debridement 2/16  Antimicrobials:   Daptomycin 2/15-2/18  Rocephin 2/14----   Subjective: Patient seen and examined.  Wife at the bedside.  Patient himself had just received pain medication and pain was well  controlled.  Eager to work with PT OT. Had some urinary retention overnight and needed a straight cath.  Now he thinks he can pee. Denies any nausea vomiting or abdominal pain.  Diarrhea has improved.  Objective: Vitals:   01/11/21 2044 01/12/21 0000 01/12/21 0422  01/12/21 0824  BP: (!) 149/73 (!) 147/78 140/75 (!) 145/75  Pulse: 68 67 65 64  Resp: '18 18 18 20  '$ Temp: 98.3 F (36.8 C) (!) 97.5 F (36.4 C) 98 F (36.7 C) 98 F (36.7 C)  TempSrc: Oral Oral Oral Oral  SpO2: 94% 97% 95% 96%  Weight:      Height:        Intake/Output Summary (Last 24 hours) at 01/12/2021 1401 Last data filed at 01/12/2021 0700 Gross per 24 hour  Intake 599.73 ml  Output 1600 ml  Net -1000.27 ml   Filed Weights   01/04/21 2244 01/06/21 1813 01/11/21 1509  Weight: 101.9 kg 101.9 kg 101.9 kg    Examination:  General exam: Appears comfortable on room air. Respiratory system: Clear to auscultation. Respiratory effort normal.  No added sounds. Cardiovascular system: S1 & S2 heard, RRR. No JVD Gastrointestinal system: Abdomen is nondistended, soft and nontender. No organomegaly or masses felt. Normal bowel sounds heard. Central nervous system: Alert and oriented. No focal neurological deficits. Extremities: Symmetric 5 x 5 power. Skin:  Right leg BKA stump fitted with wound vac. Bilateral 1+ edema.    Data Reviewed: I have personally reviewed following labs and imaging studies  CBC: Recent Labs  Lab 01/07/21 0325 01/08/21 0309 01/09/21 0416 01/10/21 0306 01/10/21 1938 01/11/21 0306  WBC 12.1* 15.8* 12.8* 14.6*  --  11.8*  NEUTROABS 10.9* 12.4*  --  10.7*  --  8.1*  HGB 9.4* 9.3* 9.6* 9.2* 9.7* 9.2*  HCT 28.2* 27.6* 28.7* 25.7* 28.3* 26.5*  MCV 92.2 93.2 92.0 87.1  --  88.0  PLT 202 200 217 240  --  [id number]   Basic Metabolic Panel: Recent Labs  Lab 01/07/21 0325 01/08/21 0309 01/09/21 0416 01/10/21 0306 01/11/21 0306 01/12/21 0335  NA 133* 134* 135 133* 133* 130*  K 3.6 3.5 3.7 4.3 4.2 4.6  CL 101 105 107 106 107 103  CO2 20* 19* 18* 15* 16* 13*  GLUCOSE 229* 188* 175* 143* 154* 354*  BUN 65* 73* 67* 62* 63* 64*  CREATININE 5.27* 5.40* 5.70* 5.77* 5.96* 5.86*  CALCIUM 7.5* 7.3* 7.6* 7.9* 7.9* 7.9*  MG 2.0 2.2 2.0 1.7 1.6*  --   PHOS 5.4*  5.6* 5.6* 5.8* 6.1*  --    GFR: Estimated Creatinine Clearance: 16.4 mL/min (A) (by C-G formula based on SCr of 5.86 mg/dL (H)). Liver Function Tests: Recent Labs  Lab 01/06/21 0330 01/07/21 0325 01/08/21 0309 01/09/21 0416 01/10/21 0306 01/11/21 0306 01/12/21 0335  AST 243* 212* 167*  --   --  90* 98*  ALT 133* 143* 136*  --   --  118* 120*  ALKPHOS 612* 682* 680*  --   --  600* 716*  BILITOT 2.8* 3.2* 2.3*  --   --  4.4* 3.8*  PROT 6.0* 5.8* 5.3*  --   --  5.5* 5.8*  ALBUMIN 2.0* 1.7* 1.7* 1.7* 1.5* 1.5* 1.5*   No results for input(s): LIPASE, AMYLASE in the last 168 hours. No results for input(s): AMMONIA in the last 168 hours. Coagulation Profile: No results for input(s): INR, PROTIME in the last 168 hours. Cardiac Enzymes: Recent Labs  Lab 01/06/21 0330  CKTOTAL 65   BNP (last 3 results) No results for input(s): PROBNP in the last 8760 hours. HbA1C: No results for input(s): HGBA1C in the last 72 hours. CBG: Recent Labs  Lab 01/11/21 2042 01/11/21 2054 01/11/21 2348 01/12/21 0821 01/12/21 1202  GLUCAP 212* 202* 295* 368* 288*   Lipid Profile: No results for input(s): CHOL, HDL, LDLCALC, TRIG, CHOLHDL, LDLDIRECT in the last 72 hours. Thyroid Function Tests: No results for input(s): TSH, T4TOTAL, FREET4, T3FREE, THYROIDAB in the last 72 hours. Anemia Panel: No results for input(s): VITAMINB12, FOLATE, FERRITIN, TIBC, IRON, RETICCTPCT in the last 72 hours. Sepsis Labs: No results for input(s): PROCALCITON, LATICACIDVEN in the last 168 hours.  Recent Results (from the past 240 hour(s))  WOUND CULTURE     Status: Abnormal   Collection Time: 01/04/21 12:13 PM  Result Value Ref Range Status   MICRO NUMBER: [id number]  Final   SPECIMEN QUALITY: Adequate  Final   SOURCE: WOUND (SITE NOT SPECIFIED)  Final   STATUS: FINAL  Final   GRAM STAIN:   Final    Rare Polymorphonuclear leukocytes Few epithelial cells Many Gram positive cocci in pairs Many Gram positive  cocci in clusters Many Gram negative bacilli   ISOLATE 1: Group G Streptococcus (A)  Final    Comment: Heavy growth of Group G Streptococcus Beta-hemolytic streptococci are predictably susceptible to Penicillin and other beta-lactams. Susceptibility testing not routinely performed. Please contact the laboratory within 3 days if susceptibility testing is  desired.   Aerobic Culture w Gram Stain (superficial specimen)     Status: None   Collection Time: 01/04/21  5:19 PM   Specimen: Foot; Wound  Result Value Ref Range Status   Specimen Description   Final    FOOT RIGHT Performed at [facility] [address] [place], [place] [address]    Special Requests   Final    NONE Performed at [facility], [place] [address] [place], [place] [address]    Gram Stain   Final    MODERATE WBC PRESENT, PREDOMINANTLY PMN ABUNDANT GRAM POSITIVE COCCI ABUNDANT GRAM NEGATIVE RODS MODERATE GRAM POSITIVE RODS Performed at [facility], [place] [address], [place], [place] [address]    Culture FEW CITROBACTER KOSERI  Final   Report Status 01/09/2021 FINAL  Final   Organism ID, Bacteria CITROBACTER KOSERI  Final      Susceptibility   Citrobacter koseri - MIC*    CEFAZOLIN <=4 SENSITIVE Sensitive     CEFEPIME <=0.12 SENSITIVE Sensitive     CEFTAZIDIME <=1 SENSITIVE Sensitive     CEFTRIAXONE <=0.25 SENSITIVE Sensitive     CIPROFLOXACIN <=0.25 SENSITIVE Sensitive     GENTAMICIN <=1 SENSITIVE Sensitive     IMIPENEM <=0.25 SENSITIVE Sensitive     TRIMETH/SULFA <=20 SENSITIVE Sensitive     PIP/TAZO 8 SENSITIVE Sensitive     * FEW CITROBACTER KOSERI  Culture, blood (routine x 2)     Status: None   Collection Time: 01/04/21  5:23 PM   Specimen: BLOOD  Result Value Ref Range Status   Specimen Description   Final    BLOOD LEFT ANTECUBITAL Performed at [facility] [address] [place], [place] [address]    Special Requests   Final    BOTTLES  DRAWN AEROBIC AND ANAEROBIC Blood Culture results may not be optimal due to an inadequate volume of blood received in culture bottles Performed at [facility] [address]  [place], [place] [address]    Culture   Final    NO GROWTH 5 DAYS Performed at [facility], [place] [address], [place], [place] [address]    Report Status 01/09/2021 FINAL  Final  Culture, blood (routine x 2)     Status: Abnormal   Collection Time: 01/04/21  6:04 PM   Specimen: BLOOD  Result Value Ref Range Status   Specimen Description   Final    BLOOD RIGHT ANTECUBITAL Performed at [facility] [address] [place], [place] [address]    Special Requests   Final    BOTTLES DRAWN AEROBIC AND ANAEROBIC Blood Culture adequate volume Performed at [facility] [address] [place], [place] [address]    Culture  Setup Time   Final    GRAM NEGATIVE RODS AEROBIC BOTTLE ONLY CRITICAL RESULT CALLED TO, READ BACK BY AND VERIFIED WITH: PHARMD VERANDA BRYK BY MESSAN HY. AT 2330 ON 01/09/2021 Performed at [facility] [address], [place], [place] [address]    Culture CITROBACTER KOSERI (A)  Final   Report Status 01/11/2021 FINAL  Final   Organism ID, Bacteria CITROBACTER KOSERI  Final      Susceptibility   Citrobacter koseri - MIC*    CEFAZOLIN <=4 SENSITIVE Sensitive     CEFEPIME <=0.12 SENSITIVE Sensitive     CEFTAZIDIME <=1 SENSITIVE Sensitive     CEFTRIAXONE <=0.25 SENSITIVE Sensitive     CIPROFLOXACIN <=0.25 SENSITIVE Sensitive     GENTAMICIN <=1 SENSITIVE Sensitive     IMIPENEM <=0.25 SENSITIVE Sensitive     TRIMETH/SULFA <=20 SENSITIVE Sensitive     PIP/TAZO 8 SENSITIVE Sensitive     * CITROBACTER KOSERI  Blood Culture ID Panel (Reflexed)     Status: Abnormal   Collection Time: 01/04/21  6:04 PM  Result Value Ref Range Status   Enterococcus faecalis NOT DETECTED NOT DETECTED Final   Enterococcus Faecium NOT DETECTED NOT  DETECTED Final   Listeria monocytogenes NOT DETECTED NOT DETECTED Final   Staphylococcus species NOT DETECTED NOT DETECTED Final   Staphylococcus aureus (BCID) NOT DETECTED NOT DETECTED Final   Staphylococcus epidermidis NOT DETECTED NOT DETECTED Final   Staphylococcus lugdunensis NOT DETECTED NOT DETECTED Final   Streptococcus species NOT DETECTED NOT DETECTED Final   Streptococcus agalactiae NOT DETECTED NOT DETECTED Final   Streptococcus pneumoniae NOT DETECTED NOT DETECTED Final   Streptococcus pyogenes NOT DETECTED NOT DETECTED Final   A.calcoaceticus-baumannii NOT DETECTED NOT DETECTED Final   Bacteroides fragilis NOT DETECTED NOT DETECTED Final   Enterobacterales DETECTED (A) NOT DETECTED Final    Comment: Enterobacterales represent a large order of gram negative bacteria, not a single organism. Refer to culture for further identification. CRITICAL RESULT CALLED TO, READ BACK BY AND VERIFIED WITH: PHARMD VERANDA BRYK BY MESSAN HY. AT 2330 ON 01/09/2021    Enterobacter cloacae complex NOT DETECTED NOT DETECTED Final   Escherichia coli NOT DETECTED NOT DETECTED Final   Klebsiella aerogenes NOT DETECTED NOT DETECTED Final   Klebsiella oxytoca NOT DETECTED NOT DETECTED Final   Klebsiella pneumoniae NOT DETECTED NOT DETECTED Final   Proteus species NOT DETECTED NOT DETECTED Final   Salmonella species NOT DETECTED NOT DETECTED Final   Serratia marcescens NOT DETECTED NOT DETECTED Final   Haemophilus influenzae NOT DETECTED NOT DETECTED Final   Neisseria meningitidis NOT DETECTED NOT DETECTED Final   Pseudomonas aeruginosa NOT DETECTED NOT DETECTED Final   Stenotrophomonas maltophilia NOT DETECTED NOT DETECTED  Final   Candida albicans NOT DETECTED NOT DETECTED Final   Candida auris NOT DETECTED NOT DETECTED Final   Candida glabrata NOT DETECTED NOT DETECTED Final   Candida krusei NOT DETECTED NOT DETECTED Final   Candida parapsilosis NOT DETECTED NOT DETECTED Final   Candida  tropicalis NOT DETECTED NOT DETECTED Final   Cryptococcus neoformans/gattii NOT DETECTED NOT DETECTED Final   CTX-M ESBL NOT DETECTED NOT DETECTED Final   Carbapenem resistance IMP NOT DETECTED NOT DETECTED Final   Carbapenem resistance KPC NOT DETECTED NOT DETECTED Final   Carbapenem resistance NDM NOT DETECTED NOT DETECTED Final   Carbapenem resist OXA 48 LIKE NOT DETECTED NOT DETECTED Final   Carbapenem resistance VIM NOT DETECTED NOT DETECTED Final    Comment: Performed at [facility], [address] [place], [place] [address]  Resp Panel by RT-PCR (Flu A&B, Covid) Nasopharyngeal Swab     Status: None   Collection Time: 01/04/21  6:05 PM   Specimen: Nasopharyngeal Swab; Nasopharyngeal(NP) swabs in vial transport medium  Result Value Ref Range Status   SARS Coronavirus 2 by RT PCR NEGATIVE NEGATIVE Final    Comment: (NOTE) SARS-CoV-2 target nucleic acids are NOT DETECTED.  The SARS-CoV-2 RNA is generally detectable in upper respiratory specimens during the acute phase of infection. The lowest concentration of SARS-CoV-2 viral copies this assay can detect is 138 copies/mL. A negative result does not preclude SARS-Cov-2 infection and should not be used as the sole basis for treatment or other patient management decisions. A negative result may occur with  improper specimen collection/handling, submission of specimen other than nasopharyngeal swab, presence of viral mutation(s) within the areas targeted by this assay, and inadequate number of viral copies(<138 copies/mL). A negative result must be combined with clinical observations, patient history, and epidemiological information. The expected result is Negative.  Fact Sheet for Patients:  EntrepreneurPulse.com.au  Fact Sheet for Healthcare Providers:  IncredibleEmployment.be  This test is no t yet approved or cleared by the [place] FDA and  has been authorized for detection  and/or diagnosis of SARS-CoV-2 by FDA under an Emergency Use Authorization (EUA). This EUA will remain  in effect (meaning this test can be used) for the duration of the COVID-19 declaration under Section 564(b)(1) of the Act, 21 U.S.C.section 360bbb-3(b)(1), unless the authorization is terminated  or revoked sooner.       Influenza A by PCR NEGATIVE NEGATIVE Final   Influenza B by PCR NEGATIVE NEGATIVE Final    Comment: (NOTE) The Xpert Xpress SARS-CoV-2/FLU/RSV plus assay is intended as an aid in the diagnosis of influenza from Nasopharyngeal swab specimens and should not be used as a sole basis for treatment. Nasal washings and aspirates are unacceptable for Xpert Xpress SARS-CoV-2/FLU/RSV testing.  Fact Sheet for Patients: EntrepreneurPulse.com.au  Fact Sheet for Healthcare Providers: IncredibleEmployment.be  This test is not yet approved or cleared by the [place] FDA and has been authorized for detection and/or diagnosis of SARS-CoV-2 by FDA under an Emergency Use Authorization (EUA). This EUA will remain in effect (meaning this test can be used) for the duration of the COVID-19 declaration under Section 564(b)(1) of the Act, 21 U.S.C. section 360bbb-3(b)(1), unless the authorization is terminated or revoked.  Performed at [facility], [place] [address], [place], [place] [address]   Surgical PCR screen     Status: None   Collection Time: 01/04/21 10:41 PM   Specimen: Nasal Mucosa; Nasal Swab  Result Value Ref Range Status  MRSA, PCR NEGATIVE NEGATIVE Final   Staphylococcus aureus NEGATIVE NEGATIVE Final    Comment: (NOTE) The Xpert SA Assay (FDA approved for NASAL specimens in patients 23 years of age and older), is one component of a comprehensive surveillance program. It is not intended to diagnose infection nor to guide or monitor treatment. Performed at [facility], [place] [address], [place], [place] [address]   Aerobic/Anaerobic Culture w Gram Stain (surgical/deep wound)     Status: None   Collection Time: 01/06/21  8:46 PM   Specimen: Abscess  Result Value Ref Range Status   Specimen Description   Final    ABSCESS RIGHT HEEL Performed at [facility] [address], [place], [place] [address]    Special Requests   Final    NONE Performed at [facility], [place] [address], [place], [place] [address]    Gram Stain NO WBC SEEN RARE GRAM POSITIVE COCCI   Final   Culture   Final    No growth aerobically or anaerobically. Performed at [facility], [place] [address], [place], [place] [address]    Report Status 01/12/2021 FINAL  Final  Culture, blood (routine x 2)     Status: None (Preliminary result)   Collection Time: 01/11/21  6:55 PM   Specimen: BLOOD LEFT HAND  Result Value Ref Range Status   Specimen Description BLOOD LEFT HAND  Final   Special Requests   Final    BOTTLES DRAWN AEROBIC ONLY Blood Culture adequate volume   Culture   Final    NO GROWTH < 12 HOURS Performed at [facility], [place] [address], [place], [place] [address]    Report Status PENDING  Incomplete  Culture, blood (routine x 2)     Status: None (Preliminary result)   Collection Time: 01/11/21  6:55 PM   Specimen: BLOOD LEFT HAND  Result Value Ref Range Status   Specimen Description BLOOD LEFT HAND  Final   Special Requests   Final    BOTTLES DRAWN AEROBIC ONLY Blood Culture adequate volume   Culture   Final    NO GROWTH < 12 HOURS Performed at [facility], [place] [address], [place], [place] [address]    Report Status PENDING  Incomplete         Radiology Studies: ECHOCARDIOGRAM COMPLETE  Result Date: 01/10/2021    ECHOCARDIOGRAM REPORT   Patient Name:   DWYANE PRUETTE Date of Exam: 01/10/2021 Medical Rec #:  [id number]     Height:       74.0 in Accession #:    [id number]    Weight:       224.6 lb Date of Birth:  [birth year]      BSA:          2.284 m Patient Age:    49 years      BP:           152/71 mmHg Patient Gender: M             HR:           63 bpm. Exam Location:  Inpatient Procedure: 2D Echo, Cardiac Doppler and Color Doppler Indications:     Pre-op examination  History:         Patient has no prior history of Echocardiogram examinations.                  Signs/Symptoms:Bacteremia.  Sonographer:  [name] RDCS Referring Phys:  [id number] Barb Merino Diagnosing Phys: Oswaldo Milian MD IMPRESSIONS  1. Left ventricular ejection fraction, by estimation, is 55 to 60%. The left ventricle has normal function. The left ventricle has no regional wall motion abnormalities. There is mild left ventricular hypertrophy. Left ventricular diastolic parameters are indeterminate.  2. Right ventricular systolic function is normal. The right ventricular size is normal. There is moderately elevated pulmonary artery systolic pressure. The estimated right ventricular systolic pressure is [id number] mmHg.  3. Left atrial size was mildly dilated.  4. Right atrial size was mildly dilated.  5. The mitral valve is normal in structure. No evidence of mitral valve regurgitation.  6. The aortic valve was not well visualized. Aortic valve regurgitation is not visualized. No aortic stenosis is present.  7. The inferior vena cava is dilated in size with <50% respiratory variability, suggesting right atrial pressure of 15 mmHg. FINDINGS  Left Ventricle: Left ventricular ejection fraction, by estimation, is 55 to 60%. The left ventricle has normal function. The left ventricle has no regional wall motion abnormalities. The left ventricular internal cavity size was normal in size. There is  mild left ventricular hypertrophy. Left ventricular diastolic parameters are indeterminate. Right Ventricle: The right ventricular size is normal. No increase in right ventricular wall thickness. Right ventricular systolic function is normal. There is moderately elevated  pulmonary artery systolic pressure. The tricuspid regurgitant velocity is 2.87 m/s, and with an assumed right atrial pressure of 15 mmHg, the estimated right ventricular systolic pressure is [id number] mmHg. Left Atrium: Left atrial size was mildly dilated. Right Atrium: Right atrial size was mildly dilated. Pericardium: There is no evidence of pericardial effusion. Mitral Valve: The mitral valve is normal in structure. No evidence of mitral valve regurgitation. Tricuspid Valve: The tricuspid valve is normal in structure. Tricuspid valve regurgitation is trivial. Aortic Valve: The aortic valve was not well visualized. Aortic valve regurgitation is not visualized. No aortic stenosis is present. Aortic valve mean gradient measures 5.0 mmHg. Aortic valve peak gradient measures 9.9 mmHg. Aortic valve area, by VTI measures 2.98 cm. Pulmonic Valve: The pulmonic valve was not well visualized. Pulmonic valve regurgitation is not visualized. Aorta: The aortic root is normal in size and structure. Venous: The inferior vena cava is dilated in size with less than 50% respiratory variability, suggesting right atrial pressure of 15 mmHg. IAS/Shunts: No atrial level shunt detected by color flow Doppler.  LEFT VENTRICLE PLAX 2D LVIDd:         4.90 cm     Diastology LVIDs:         3.60 cm     LV e' medial:    8.27 cm/s LV PW:         1.20 cm     LV E/e' medial:  16.8 LV IVS:        1.00 cm     LV e' lateral:   9.14 cm/s LVOT diam:     2.20 cm     LV E/e' lateral: 15.2 LV SV:         89 LV SV Index:   39 LVOT Area:     3.80 cm  LV Volumes (MOD) LV vol d, MOD A4C: 96.3 ml LV vol s, MOD A4C: 42.7 ml LV SV MOD A4C:     96.3 ml RIGHT VENTRICLE          IVC RV Basal diam:  4.00 cm  IVC diam: 2.50 cm LEFT ATRIUM  Index       RIGHT ATRIUM           Index LA diam:        4.60 cm 2.01 cm/m  RA Area:     23.40 cm LA Vol (A2C):   71.8 ml 31.44 ml/m RA Volume:   85.10 ml  37.27 ml/m LA Vol (A4C):   87.9 ml 38.49 ml/m LA Biplane Vol:  87.1 ml 38.14 ml/m  AORTIC VALVE AV Area (Vmax):    2.69 cm AV Area (Vmean):   2.88 cm AV Area (VTI):     2.98 cm AV Vmax:           157.00 cm/s AV Vmean:          99.800 cm/s AV VTI:            0.297 m AV Peak Grad:      9.9 mmHg AV Mean Grad:      5.0 mmHg LVOT Vmax:         111.00 cm/s LVOT Vmean:        75.700 cm/s LVOT VTI:          0.233 m LVOT/AV VTI ratio: 0.78  AORTA Ao Root diam: 3.50 cm MITRAL VALVE                TRICUSPID VALVE MV Area (PHT): 5.13 cm     TR Peak grad:   32.9 mmHg MV Decel Time: 148 msec     TR Vmax:        287.00 cm/s MV E velocity: 139.00 cm/s MV A velocity: 78.20 cm/s   SHUNTS MV E/A ratio:  1.78         Systemic VTI:  0.23 m                             Systemic Diam: 2.20 cm Oswaldo Milian MD Electronically signed by Oswaldo Milian MD Signature Date/Time: 01/10/2021/3:12:27 PM    Final (Updated)         Scheduled Meds: . amLODipine  10 mg Oral Daily  . aspirin EC  81 mg Oral QHS  . atenolol  50 mg Oral Daily  . cloNIDine  0.1 mg Oral BID  . docusate sodium  100 mg Oral BID  . hydrALAZINE  50 mg Oral BID  . insulin aspart  0-15 Units Subcutaneous TID WC  . insulin aspart  0-5 Units Subcutaneous QHS  . insulin aspart  4 Units Subcutaneous TID WC  . insulin glargine  35 Units Subcutaneous QHS  . omega-3 acid ethyl esters  1 g Oral Daily  . pregabalin  50 mg Oral TID  . tamsulosin  0.4 mg Oral QPC supper   Continuous Infusions: . sodium chloride Stopped (01/12/21 0053)  . cefTRIAXone (ROCEPHIN)  IV 2 g (01/11/21 2237)  . methocarbamol (ROBAXIN) IV       LOS: 8 days    Time spent: 32 minutes     Barb Merino, MD [facility] Pager [phone]

## 2021-01-12 NOTE — Progress Notes (Addendum)
[facility] Progress Note   Subjective:  No new c/o   Objective Vitals:   01/12/21 0000 01/12/21 0422 01/12/21 0824 01/12/21 1600  BP: (!) 147/78 140/75 (!) 145/75 (!) 145/72  Pulse: 67 65 64 (!) 57  Resp: '18 18 20 18  '$ Temp: (!) 97.5 F (36.4 C) 98 F (36.7 C) 98 F (36.7 C)   TempSrc: Oral Oral Oral   SpO2: 97% 95% 96% 96%  Weight:      Height:         Additional Objective Labs: Basic Metabolic Panel: Recent Labs  Lab 01/09/21 0416 01/10/21 0306 01/11/21 0306 01/12/21 0335  NA 135 133* 133* 130*  K 3.7 4.3 4.2 4.6  CL 107 106 107 103  CO2 18* 15* 16* 13*  GLUCOSE 175* 143* 154* 354*  BUN 67* 62* 63* 64*  CREATININE 5.70* 5.77* 5.96* 5.86*  CALCIUM 7.6* 7.9* 7.9* 7.9*  PHOS 5.6* 5.8* 6.1*  --    CBC: Recent Labs  Lab 01/07/21 0325 01/08/21 0309 01/09/21 0416 01/10/21 0306 01/10/21 1938 01/11/21 0306  WBC 12.1* 15.8* 12.8* 14.6*  --  11.8*  NEUTROABS 10.9* 12.4*  --  10.7*  --  8.1*  HGB 9.4* 9.3* 9.6* 9.2* 9.7* 9.2*  HCT 28.2* 27.6* 28.7* 25.7* 28.3* 26.5*  MCV 92.2 93.2 92.0 87.1  --  88.0  PLT 202 200 217 240  --  215   Blood Culture    Component Value Date/Time   SDES BLOOD LEFT HAND 01/11/2021 1855   SDES BLOOD LEFT HAND 01/11/2021 1855   SPECREQUEST  01/11/2021 1855    BOTTLES DRAWN AEROBIC ONLY Blood Culture adequate volume   SPECREQUEST  01/11/2021 1855    BOTTLES DRAWN AEROBIC ONLY Blood Culture adequate volume   CULT  01/11/2021 1855    NO GROWTH < 12 HOURS Performed at [facility], [address], [place], [place] [address]    CULT  01/11/2021 1855    NO GROWTH < 12 HOURS Performed at [facility] [address], [place], [place] [address]    REPTSTATUS PENDING 01/11/2021 1855   REPTSTATUS PENDING 01/11/2021 1855     Physical Exam General: Well appearing, nad  Heart: RRR  Lungs: Clear bilaterally  Abdomen: soft non-tender  Extremities:  1-2+ bilat LE edema, R foot in bandage  Neuro: A &O x3,  non focal    Medications:  sodium chloride Stopped (01/12/21 0053)   cefTRIAXone (ROCEPHIN)  IV 2 g (01/11/21 2237)   methocarbamol (ROBAXIN) IV      amLODipine  10 mg Oral Daily   aspirin EC  81 mg Oral QHS   atenolol  50 mg Oral Daily   cloNIDine  0.1 mg Oral BID   docusate sodium  100 mg Oral BID   hydrALAZINE  50 mg Oral BID   insulin aspart  0-15 Units Subcutaneous TID WC   insulin aspart  0-5 Units Subcutaneous QHS   insulin aspart  4 Units Subcutaneous TID WC   insulin glargine  35 Units Subcutaneous QHS   omega-3 acid ethyl esters  1 g Oral Daily   pregabalin  50 mg Oral TID   tamsulosin  0.4 mg Oral QPC supper     Assessment/Plan: 1. AKI: b/l creat from mid 2021 was 1.09.  Here creat 4.0 on admit 2/15, then peaked at 5.96 on 2/21, down to 5.86 today. Good UOP, BP's wnl, no hypotension here. Renal ultrasound consistent with medical renal disease.  Suspect ATN. Lisinopril, HCTZ dc'd. No indication for RRT at this time. Will follow.  2. 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 Per orthopedics.  3. HTN: Lisinopril/HCTZ held. Continue atenolol, clonidine, amlodipine.  4. Anemia-  Secondary to chronic illness. Tsat low, holding IV Fe in the setting of active infection  6. DM - Insulin per primary team.  7. Urinary retention: new per patient the last several days, not sure cause, had to have I/O cath today 1st time. Consulting pharm for question of medication side effect, possibly IV pain meds (?).   Kelly Splinter, MD 01/12/2021, 4:56 PM

## 2021-01-12 NOTE — Evaluation (Signed)
Physical Therapy Evaluation Patient Details Name: Fernando Anthony MRN: [id number] DOB: [birth year] Today's Date: 01/12/2021   History of Present Illness  Pt is a 65 y.o. male admitted 01/04/21 with R ankle osteomyelitis, AKI. S/p R foot I&D, antibiotic bead placement and bone biopsy 2/16. 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    Clinical Impression  Pt presents with an overall decrease in functional mobility secondary to above. PTA, pt mod indep with RW use due to R foot pain, lives with supportive wife. Initiated educ on precautions, positioning, limb guard wear, therex, and importance of mobility. Today, pt able to initiate transfer training with RW and modA; pt with difficulty hopping on L foot, unable to offload completely, reliant on scooting steps. Pt limited by generalized weakness, pain, decreased activity tolerance and impaired balance strategies/postural reactions; at high risk for falls. Pt very motivated; feel pt would benefit from intensive CIR-level therapies to maximize functional mobility and independence prior to return home. Pt and wife hopeful for d/c home, but interested in CIR if an option.     Follow Up Recommendations CIR;Supervision for mobility/OOB    Equipment Recommendations   (TBD)    Recommendations for Other Services       Precautions / Restrictions Precautions Precautions: Fall;Other (comment) Precaution Comments: RLE wound vac Required Braces or Orthoses: Other Brace Other Brace: R residual limb guard Restrictions Weight Bearing Restrictions: Yes RLE Weight Bearing: Non weight bearing      Mobility  Bed Mobility Overal bed mobility: Modified Independent             General bed mobility comments: HOB elevated, increased time and effort scooting to EOB    Transfers Overall transfer level: Needs  assistance Equipment used: Rolling walker (2 wheeled) Transfers: Sit to/from Stand Sit to Stand: Mod assist;From elevated surface         General transfer comment: Pt unable to stand from low bed height, reliant on modA to elevate trunk and maintain balance standing from elevated bed height to RW; additional sit<>stand trials from recliner, heavy reliance on BUE support with modA to maintain balance transitioning BUE support from recliner arm rests to RW, poor eccentric control into sitting; reports LLE feeling weak  Ambulation/Gait Ambulation/Gait assistance: Mod assist Gait Distance (Feet): 2 Feet Assistive device: Rolling walker (2 wheeled)   Gait velocity: Decreased   General Gait Details: Pt unable to offload L foot in order to take complete hop on LLE with RW; able to scoot pivotally from bed to recliner, the hop minimally forwards/backwards with RW and modA for stability  Stairs            Wheelchair Mobility    Modified Rankin (Stroke Patients Only)       Balance Overall balance assessment: Needs assistance   Sitting balance-Leahy Scale: Fair Sitting balance - Comments: Assist from wife to don boxer shorts sitting; pt able to offload buttocks R/L for pressure relief while seated in recliner     Standing balance-Leahy Scale: Poor Standing balance comment: Heavy reliance on BUE support                             Pertinent Vitals/Pain Pain Assessment: Faces Faces Pain Scale: Hurts a little bit Pain Location: R residual limb Pain Descriptors / Indicators: Throbbing Pain Intervention(s): Monitored during session;Repositioned    Home Living Family/patient  expects to be discharged to:: Private residence Living Arrangements: Spouse/significant other Available Help at Discharge: Family;Available 24 hours/day Type of Home: House Home Access: Ramped entrance     Home Layout: One level Home Equipment: Walker - 2 wheels;Crutches;Bedside  commode;Shower seat;Grab bars - tub/shower;Electric scooter;Other (comment) (knee scooter) Additional Comments: Wife works full-time, but has taken time off and can work from home temporarily    Prior Function Level of Independence: Independent with assistive device(s)         Comments: Mod indep ambulating with RW; uses scooter provided at grocery store. Wife drives. Has been getting into tub, uses seat for showers     Hand Dominance        Extremity/Trunk Assessment   Upper Extremity Assessment Upper Extremity Assessment: Overall WFL for tasks assessed    Lower Extremity Assessment Lower Extremity Assessment: RLE deficits/detail;LLE deficits/detail RLE Deficits / Details: S/p R transtibial amputation; noted swelling throughout (limb guard and shrinker donned); able to perform SLR LLE Deficits / Details: Functionally >3/5 throughout, pt reports LLE feeling weak with standing attempts; able to perform SLR while seated but unable to maintain hip flexion and poor eccentric control with SLR       Communication   Communication: No difficulties  Cognition Arousal/Alertness: Awake/alert Behavior During Therapy: WFL for tasks assessed/performed Overall Cognitive Status: Within Functional Limits for tasks assessed                                 General Comments: WFL for tasks during session. Reports, "My wife is here because I'm having a harder time remembering things"      General Comments General comments (skin integrity, edema, etc.): Wife present and supportive. Initiated ampuation educ re: limb guard wear (daily skin checks by removing), precautions/positioning (resting in knee extension), ROM/therex (will plan for HEP training next session), pressure relief, phantom limb pain, DME needs, CIR vs. HH therapies    Exercises Amputee Exercises Straight Leg Raises: AROM;Both;Seated   Assessment/Plan    PT Assessment Patient needs continued PT services  PT  Problem List Decreased strength;Decreased activity tolerance;Decreased balance;Decreased mobility;Decreased knowledge of use of DME;Decreased knowledge of precautions;Pain       PT Treatment Interventions DME instruction;Gait training;Stair training;Functional mobility training;Therapeutic activities;Therapeutic exercise;Balance training;Patient/family education;Wheelchair mobility training    PT Goals (Current goals can be found in the Care Plan section)  Acute Rehab PT Goals Patient Stated Goal: Hopeful for d/c home, but interested in CIR option PT Goal Formulation: With patient/family Time For Goal Achievement: 01/26/21 Potential to Achieve Goals: Good    Frequency Min 5X/week   Barriers to discharge        Co-evaluation               AM-PAC PT "6 Clicks" Mobility  Outcome Measure Help needed turning from your back to your side while in a flat bed without using bedrails?: A Little Help needed moving from lying on your back to sitting on the side of a flat bed without using bedrails?: A Little Help needed moving to and from a bed to a chair (including a wheelchair)?: A Lot Help needed standing up from a chair using your arms (e.g., wheelchair or bedside chair)?: A Lot Help needed to walk in hospital room?: A Lot Help needed climbing 3-5 steps with a railing? : A Lot 6 Click Score: 14    End of Session Equipment Utilized During Treatment: Gait belt  Activity Tolerance: Patient tolerated treatment well Patient left: in chair;with call bell/phone within reach;with family/visitor present Nurse Communication: Mobility status PT Visit Diagnosis: Other abnormalities of gait and mobility (R26.89);Muscle weakness (generalized) (M62.81)    Time: [id number] PT Time Calculation (min) (ACUTE ONLY): 29 min   Charges:   PT Evaluation $PT Eval Moderate Complexity: 1 Mod PT Treatments $Therapeutic Activity: 8-22 mins   Mabeline Caras, PT, DPT Acute Rehabilitation Services  Pager  [phone] Office [phone]  Derry Lory 01/12/2021, 11:10 AM

## 2021-01-12 NOTE — Progress Notes (Addendum)
Pharmacy Consult: Medications Associated with Urinary Retention  Fernando Anthony is a 65 yr old male with advancing renal insufficiency was admitted on 01/04/21 with R plantar ulcer wound infection/osteomyelitis R calcaneus. Pt is S/P transtibial amputation on 01/11/21. He has new-onset urinary retention for which he is receiving Flomax. Pharmacy is consulted to review pt's med list re: whether any of the pt's meds are associated with urinary retention.  Anesthesia, medications and surgical pain all may potentially impact micturition and cause urinary retention. Opioid medications (e.g., oxycodone, hydromorphone that the pt is receving) have been associated with urinary retention in the post-op setting. In addition, muscle relaxants (pt is receiving methocarbamol) and anticholinergics (promethazine) may cause urinary retention.  Because the pt's urinary retention is new, his home medications are less likely to be a cause of his urinary retention. However, two of his home medications (calcium channel blocker/amlodipine and hydralazine) have been associated with urinary retention.  Given the timing of onset of the pt's urinary retention, opioid analgesics, residual effects of anesthesia and surgical pain are likely contributing to his urinary retention.  Fernando Anthony, PharmD, BCPS, [facility] Clinical Pharmacist

## 2021-01-12 NOTE — Anesthesia Postprocedure Evaluation (Signed)
Anesthesia Post Note  Patient: Fernando Anthony  Procedure(s) Performed: AMPUTATION BELOW KNEE (Right Leg Lower) APPLICATION OF WOUND VAC (Right Leg Lower)     Patient location during evaluation: PACU Anesthesia Type: Regional and General Level of consciousness: awake and alert Pain management: pain level controlled Vital Signs Assessment: post-procedure vital signs reviewed and stable Respiratory status: spontaneous breathing, nonlabored ventilation, respiratory function stable and patient connected to nasal cannula oxygen Cardiovascular status: blood pressure returned to baseline and stable Postop Assessment: no apparent nausea or vomiting Anesthetic complications: no   No complications documented.  Last Vitals:  Vitals:   01/12/21 0000 01/12/21 0422  BP: (!) 147/78 140/75  Pulse: 67 65  Resp: 18 18  Temp: (!) 36.4 C 36.7 C  SpO2: 97% 95%    Last Pain:  Vitals:   01/12/21 0629  TempSrc:   PainSc: Rodriguez Hevia

## 2021-01-13 ENCOUNTER — Telehealth: Payer: Self-pay | Admitting: Orthopedic Surgery

## 2021-01-13 DIAGNOSIS — M86171 Other acute osteomyelitis, right ankle and foot: Secondary | ICD-10-CM | POA: Diagnosis not present

## 2021-01-13 DIAGNOSIS — N179 Acute kidney failure, unspecified: Secondary | ICD-10-CM | POA: Diagnosis not present

## 2021-01-13 DIAGNOSIS — R945 Abnormal results of liver function studies: Secondary | ICD-10-CM | POA: Diagnosis not present

## 2021-01-13 DIAGNOSIS — E1121 Type 2 diabetes mellitus with diabetic nephropathy: Secondary | ICD-10-CM | POA: Diagnosis not present

## 2021-01-13 LAB — GLUCOSE, CAPILLARY
Glucose-Capillary: 161 mg/dL — ABNORMAL HIGH (ref 70–99)
Glucose-Capillary: 115 mg/dL — ABNORMAL HIGH (ref 70–99)
Glucose-Capillary: 86 mg/dL (ref 70–99)
Glucose-Capillary: 104 mg/dL — ABNORMAL HIGH (ref 70–99)

## 2021-01-13 LAB — CBC WITH DIFFERENTIAL/PLATELET
Abs Immature Granulocytes: 0.31 10*3/uL — ABNORMAL HIGH (ref 0.00–0.07)
Basophils Absolute: 0 10*3/uL (ref 0.0–0.1)
Basophils Relative: 0 %
Eosinophils Absolute: 0.3 10*3/uL (ref 0.0–0.5)
Eosinophils Relative: 2 %
HCT: 23.8 % — ABNORMAL LOW (ref 39.0–52.0)
Hemoglobin: 8.2 g/dL — ABNORMAL LOW (ref 13.0–17.0)
Immature Granulocytes: 2 %
Lymphocytes Relative: 9 %
Lymphs Abs: 1.2 10*3/uL (ref 0.7–4.0)
MCH: 30.5 pg (ref 26.0–34.0)
MCHC: 34.5 g/dL (ref 30.0–36.0)
MCV: 88.5 fL (ref 80.0–100.0)
Monocytes Absolute: 1.5 10*3/uL — ABNORMAL HIGH (ref 0.1–1.0)
Monocytes Relative: 12 %
Neutro Abs: 10.1 10*3/uL — ABNORMAL HIGH (ref 1.7–7.7)
Neutrophils Relative %: 75 %
Platelets: 227 10*3/uL (ref 150–400)
RBC: 2.69 MIL/uL — ABNORMAL LOW (ref 4.22–5.81)
RDW: 15.4 % (ref 11.5–15.5)
WBC: 13.4 10*3/uL — ABNORMAL HIGH (ref 4.0–10.5)
nRBC: 0 % (ref 0.0–0.2)

## 2021-01-13 LAB — COMPREHENSIVE METABOLIC PANEL
ALT: 114 U/L — ABNORMAL HIGH (ref 0–44)
AST: 135 U/L — ABNORMAL HIGH (ref 15–41)
Albumin: 1.5 g/dL — ABNORMAL LOW (ref 3.5–5.0)
Alkaline Phosphatase: 668 U/L — ABNORMAL HIGH (ref 38–126)
Anion gap: 11 (ref 5–15)
BUN: 67 mg/dL — ABNORMAL HIGH (ref 8–23)
CO2: 16 mmol/L — ABNORMAL LOW (ref 22–32)
Calcium: 7.7 mg/dL — ABNORMAL LOW (ref 8.9–10.3)
Chloride: 104 mmol/L (ref 98–111)
Creatinine, Ser: 5.78 mg/dL — ABNORMAL HIGH (ref 0.61–1.24)
GFR, Estimated: 10 mL/min — ABNORMAL LOW (ref >60)
Glucose, Bld: 173 mg/dL — ABNORMAL HIGH (ref 70–99)
Potassium: 4.3 mmol/L (ref 3.5–5.1)
Sodium: 131 mmol/L — ABNORMAL LOW (ref 135–145)
Total Bilirubin: 2.8 mg/dL — ABNORMAL HIGH (ref 0.3–1.2)
Total Protein: 5.4 g/dL — ABNORMAL LOW (ref 6.5–8.1)

## 2021-01-13 LAB — PROTIME-INR
INR: 1.1 (ref 0.8–1.2)
Prothrombin Time: 13.9 seconds (ref 11.4–15.2)

## 2021-01-13 LAB — MAGNESIUM: Magnesium: 1.8 mg/dL (ref 1.7–2.4)

## 2021-01-13 LAB — PHOSPHORUS: Phosphorus: 6.7 mg/dL — ABNORMAL HIGH (ref 2.5–4.6)

## 2021-01-13 LAB — SURGICAL PATHOLOGY

## 2021-01-13 MED ORDER — DARBEPOETIN ALFA 40 MCG/0.4ML IJ SOSY
40.0000 ug | PREFILLED_SYRINGE | Freq: Once | INTRAMUSCULAR | Status: AC
  Administered 2021-01-13: 40 ug via SUBCUTANEOUS
  Filled 2021-01-13: qty 0.4

## 2021-01-13 MED ORDER — HYDROMORPHONE HCL 1 MG/ML IJ SOLN
1.0000 mg | Freq: Once | INTRAMUSCULAR | Status: AC
  Administered 2021-01-13: 1 mg via INTRAVENOUS
  Filled 2021-01-13: qty 1

## 2021-01-13 MED ORDER — FUROSEMIDE 10 MG/ML IJ SOLN
80.0000 mg | Freq: Once | INTRAMUSCULAR | Status: AC
  Administered 2021-01-13: 80 mg via INTRAVENOUS
  Filled 2021-01-13: qty 8

## 2021-01-13 NOTE — Telephone Encounter (Signed)
[employer] forms received for wife FMLA. Sent to [employer].

## 2021-01-13 NOTE — Progress Notes (Signed)
And is postop day 2 status post below-knee amputation.  He is sitting in his bed comfortably.  He did say he had a significant increase in pain and was wondering if he is getting a medication  Vital signs stable wound VAC is functioning however has no green checks there is 0 cc in the canister.   Status post above.  Reviewed patient's pain medication he has up to 15 mg of oxycodone every 4 hours as needed for breakthrough pain.  Patient also expressed to me that he was screened for inpatient rehab but he does not want to spend time in the hospital waiting for bed.  His wife has been contacting home health agencies and he said he actually would prefer to go home with home health.  Will reconnect with transition of care and from a orthopedic standpoint could be discharged once this is set up.  Would need 1 week follow-up in our office

## 2021-01-13 NOTE — Progress Notes (Signed)
Inpatient Rehabilitation Admissions Coordinator  Inpatient rehab consult received. I met with patient and his spouse at bedside. We discussed goals and expectations of a possible CIR admit. They would like me to pursue insurance approval and then will decide if CIR vs Home with [facility] pending his progress as I seek authorization. I await OT eval and PT updates today to begin authorization. I have notified acute team and TOC of goals.  Danne Baxter, RN, MSN Rehab Admissions Coordinator [phone] 01/13/2021 1:15 PM

## 2021-01-13 NOTE — Plan of Care (Signed)
  Problem: Education: Goal: Knowledge of General Education information will improve Description: Including pain rating scale, medication(s)/side effects and non-pharmacologic comfort measures 01/13/2021 0607 by Sherre Lain, RN Outcome: Progressing 01/13/2021 0607 by Sherre Lain, RN Outcome: Progressing   Problem: Health Behavior/Discharge Planning: Goal: Ability to manage health-related needs will improve 01/13/2021 0607 by Sherre Lain, RN Outcome: Progressing 01/13/2021 0607 by Sherre Lain, RN Outcome: Progressing   Problem: Clinical Measurements: Goal: Ability to maintain clinical measurements within normal limits will improve 01/13/2021 0607 by Sherre Lain, RN Outcome: Progressing 01/13/2021 0607 by Sherre Lain, RN Outcome: Progressing Goal: Will remain free from infection 01/13/2021 0607 by Sherre Lain, RN Outcome: Progressing 01/13/2021 0607 by Sherre Lain, RN Outcome: Progressing Goal: Diagnostic test results will improve 01/13/2021 0607 by Sherre Lain, RN Outcome: Progressing 01/13/2021 0607 by Sherre Lain, RN Outcome: Progressing Goal: Respiratory complications will improve 01/13/2021 0607 by Sherre Lain, RN Outcome: Progressing 01/13/2021 0607 by Sherre Lain, RN Outcome: Progressing Goal: Cardiovascular complication will be avoided 01/13/2021 0607 by Sherre Lain, RN Outcome: Progressing 01/13/2021 0607 by Sherre Lain, RN Outcome: Progressing   Problem: Activity: Goal: Risk for activity intolerance will decrease 01/13/2021 0607 by Sherre Lain, RN Outcome: Progressing 01/13/2021 0607 by Sherre Lain, RN Outcome: Progressing   Problem: Nutrition: Goal: Adequate nutrition will be maintained 01/13/2021 0607 by Sherre Lain, RN Outcome: Progressing 01/13/2021 0607 by Sherre Lain, RN Outcome: Progressing   Problem: Coping: Goal: Level of anxiety will  decrease 01/13/2021 0607 by Sherre Lain, RN Outcome: Progressing 01/13/2021 0607 by Sherre Lain, RN Outcome: Progressing   Problem: Elimination: Goal: Will not experience complications related to bowel motility 01/13/2021 0607 by Sherre Lain, RN Outcome: Progressing 01/13/2021 0607 by Sherre Lain, RN Outcome: Progressing Goal: Will not experience complications related to urinary retention 01/13/2021 0607 by Sherre Lain, RN Outcome: Progressing 01/13/2021 0607 by Sherre Lain, RN Outcome: Progressing   Problem: Pain Managment: Goal: General experience of comfort will improve 01/13/2021 0607 by Sherre Lain, RN Outcome: Progressing 01/13/2021 0607 by Sherre Lain, RN Outcome: Progressing   Problem: Safety: Goal: Ability to remain free from injury will improve 01/13/2021 0607 by Sherre Lain, RN Outcome: Progressing 01/13/2021 0607 by Sherre Lain, RN Outcome: Progressing   Problem: Skin Integrity: Goal: Risk for impaired skin integrity will decrease 01/13/2021 0607 by Sherre Lain, RN Outcome: Progressing 01/13/2021 0607 by Sherre Lain, RN Outcome: Progressing

## 2021-01-13 NOTE — Progress Notes (Signed)
[facility] Progress Note   Subjective:   Had 1 liter UOP over 2/22.  He's had trouble urinating/low stream for the past couple of weeks.  We discussed possible need for foley.  We discussed risks/benefits/indications for ESA/aranesp and he agrees to receive.   Review of systems:  Denies shortness of breath at rest but has with exertion Scrotal swelling Denies n/v No cp   Objective Vitals:   01/12/21 1600 01/12/21 1955 01/13/21 0400 01/13/21 0810  BP: (!) 145/72 138/71 (!) 149/77 (!) 159/77  Pulse: (!) 57 (!) 55 (!) 59 61  Resp: '18 18 18 18  '$ Temp:  98 F (36.7 C) 98.5 F (36.9 C) 98 F (36.7 C)  TempSrc:  Oral    SpO2: 96% 99% 95% 97%  Weight:      Height:         Additional Objective Labs: Basic Metabolic Panel: Recent Labs  Lab 01/10/21 0306 01/11/21 0306 01/12/21 0335 01/13/21 0242  NA 133* 133* 130* 131*  K 4.3 4.2 4.6 4.3  CL 106 107 103 104  CO2 15* 16* 13* 16*  GLUCOSE 143* 154* 354* 173*  BUN 62* 63* 64* 67*  CREATININE 5.77* 5.96* 5.86* 5.78*  CALCIUM 7.9* 7.9* 7.9* 7.7*  PHOS 5.8* 6.1*  --  6.7*   CBC: Recent Labs  Lab 01/08/21 0309 01/09/21 0416 01/10/21 0306 01/10/21 1938 01/11/21 0306 01/13/21 0242  WBC 15.8* 12.8* 14.6*  --  11.8* 13.4*  NEUTROABS 12.4*  --  10.7*  --  8.1* 10.1*  HGB 9.3* 9.6* 9.2* 9.7* 9.2* 8.2*  HCT 27.6* 28.7* 25.7* 28.3* 26.5* 23.8*  MCV 93.2 92.0 87.1  --  88.0 88.5  PLT 200 217 240  --  215 227   Blood Culture    Component Value Date/Time   SDES BLOOD LEFT HAND 01/11/2021 1855   SDES BLOOD LEFT HAND 01/11/2021 1855   SPECREQUEST  01/11/2021 1855    BOTTLES DRAWN AEROBIC ONLY Blood Culture adequate volume   SPECREQUEST  01/11/2021 1855    BOTTLES DRAWN AEROBIC ONLY Blood Culture adequate volume   CULT  01/11/2021 1855    NO GROWTH 2 DAYS Performed at [facility], [place] [address], [place], [place] [address]    CULT  01/11/2021 1855    NO GROWTH 2 DAYS Performed at [facility] [address] [place], [place] [address]    REPTSTATUS PENDING 01/11/2021 1855   REPTSTATUS PENDING 01/11/2021 1855     Physical Exam General adult male in bed in no acute distress HEENT normocephalic atraumatic extraocular movements intact sclera anicteric Neck supple trachea midline Lungs clear to auscultation bilaterally normal work of breathing at rest  Heart S1S2 no rub Abdomen soft nontender nondistended Extremities right BKA; left leg 2+ edema  Psych normal mood and affect Neuro - alert and oriented x 3 provides hx and follows commands GU scrotal edema; no foley   Medications: . sodium chloride Stopped (01/12/21 0053)  . cefTRIAXone (ROCEPHIN)  IV 2 g (01/12/21 2325)  . methocarbamol (ROBAXIN) IV     . amLODipine  10 mg Oral Daily  . aspirin EC  81 mg Oral QHS  . atenolol  50 mg Oral Daily  . cloNIDine  0.1 mg Oral BID  . docusate sodium  100 mg Oral BID  . hydrALAZINE  50 mg Oral BID  . insulin aspart  0-15 Units Subcutaneous TID WC  . insulin aspart  0-5 Units Subcutaneous QHS  .  insulin aspart  4 Units Subcutaneous TID WC  . insulin glargine  35 Units Subcutaneous QHS  . omega-3 acid ethyl esters  1 g Oral Daily  . pregabalin  50 mg Oral TID  . tamsulosin  0.4 mg Oral QPC supper     Assessment/Plan: 1. AKI: b/l creat from mid 2021 was 1.09.  Here creat 4.0 on admit 2/15, then peaked at 5.96 on 2/21.  Renal ultrasound consistent with medical renal disease. Suspect ATN. Lisinopril, HCTZ dc'd - No indication for RRT at this time. Will follow.  - changed to renal carb modified diet.  May need binder - stopped Prn mag citrate with AKI - lasix IV once today   2. 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 Per orthopedics.   3. HTN: Lisinopril/HCTZ held. Lasix IV once today  4. Anemia-  Secondary to chronic illness. Tsat low, holding IV Fe in the setting of active infection. AKI contributing as well.  aranesp 40 mcg once  today  6. DM - Insulin per primary team.   7. Urinary retention: new per patient the last several days.  on flomax.  S/p in/out cath.  Foley if over 250 mL retained on post void bladder scan.  Consulted pharm for question of medication side effect, possibly IV pain meds (?).    Claudia Desanctis, MD 01/13/2021, 9:47 AM

## 2021-01-13 NOTE — Evaluation (Signed)
Occupational Therapy Evaluation Patient Details Name: Fernando Anthony MRN: [id number] DOB: October 26, 1956 Today's Date: 01/13/2021    History of Present Illness Pt is a 65 y.o. male admitted 01/04/21 with R ankle osteomyelitis, AKI. S/p R foot I&D, antibiotic bead placement and bone biopsy 2/16. 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   Clinical Impression   Pt admitted with the above diagnoses and presents with below problem list. Pt will benefit from continued acute OT to address the below listed deficits and maximize independence with basic ADLs prior to d/c to venue below. At baseline, pt is mod I with ADLs. Pt is currently mod A with LB ADLs, UB ADLs with setup in supported sitting. Pain a limiting factor this session but able to sit EOB several minutes with BUE support and scoot laterally along EOB. Plan to trial functional OOB transfers next session. Educated on bed level trunk and hip bridging exercises. Spouse present and involved during session. Feel pt would benefit from intensive rehab at time of d/c.      Follow Up Recommendations  CIR    Equipment Recommendations  Defer to next venue    Recommendations for Other Services       Precautions / Restrictions Precautions Precautions: Fall;Other (comment) Precaution Comments: RLE wound vac Required Braces or Orthoses: Other Brace Other Brace: R residual limb guard Restrictions Weight Bearing Restrictions: Yes RLE Weight Bearing: Non weight bearing      Mobility Bed Mobility Overal bed mobility: Modified Independent             General bed mobility comments: HOB elevated, increased time and effort scooting to EOB    Transfers Overall transfer level: Needs assistance               General transfer comment: completed lateral scoots along EOB. Pain limiting session. Pt unable to  tolerate residual limb protector.    Balance Overall balance assessment: Needs assistance   Sitting balance-Leahy Scale: Fair Sitting balance - Comments: good static balance, fair dynamic balance.                                   ADL either performed or assessed with clinical judgement   ADL Overall ADL's : Needs assistance/impaired Eating/Feeding: Set up;Sitting   Grooming: Set up;Sitting   Upper Body Bathing: Set up;Sitting   Lower Body Bathing: Moderate assistance;Sitting/lateral leans   Upper Body Dressing : Set up;Sitting   Lower Body Dressing: Moderate assistance;Sitting/lateral leans Lower Body Dressing Details (indicate cue type and reason): partial stand Toilet Transfer: Education officer, museum;Moderate assistance;Minimal assistance Toilet Transfer Details (indicate cue type and reason): squat pivot vs lateral scoots to drop arm chair Toileting- Clothing Manipulation and Hygiene: Sitting/lateral lean;Sit to/from stand;Moderate assistance Toileting - Clothing Manipulation Details (indicate cue type and reason): partial stand       General ADL Comments: Supported sitting for UB ADLs. completes lateral scoots well at min guard level for safety and cues for technique. Transfers laterally or squat pivot     Vision         Perception     Praxis      Pertinent Vitals/Pain Pain Assessment: Faces Faces Pain Scale: Hurts little more Pain Location: R residual limb Pain Descriptors / Indicators: Throbbing Pain Intervention(s): Monitored during session;Repositioned     Hand Dominance  Extremity/Trunk Assessment Upper Extremity Assessment Upper Extremity Assessment: Overall WFL for tasks assessed   Lower Extremity Assessment Lower Extremity Assessment: Defer to PT evaluation       Communication Communication Communication: No difficulties   Cognition Arousal/Alertness: Awake/alert Behavior During Therapy: WFL for tasks  assessed/performed Overall Cognitive Status: Within Functional Limits for tasks assessed                                     General Comments  Wife present and involved during session.    Exercises Exercises: Other exercises Other Exercises Other Exercises: discussed longsitting<>supine reps and hip bridging exercises   Shoulder Instructions      Home Living Family/patient expects to be discharged to:: Private residence Living Arrangements: Spouse/significant other Available Help at Discharge: Family;Available 24 hours/day Type of Home: House Home Access: Ramped entrance     Home Layout: One level     Bathroom Shower/Tub: Teacher, early years/pre: Standard Bathroom Accessibility: No (electric scooter will not fit into bathroom)   Home Equipment: Walker - 2 wheels;Crutches;Bedside commode;Shower seat;Grab bars - tub/shower;Electric scooter;Other (comment)   Additional Comments: Wife works full-time, but has taken time off and can work from home temporarily      Prior Functioning/Environment Level of Independence: Independent with assistive device(s)        Comments: Mod indep ambulating with RW; uses scooter provided at grocery store. Wife drives. Has been getting into tub, uses seat for showers        OT Problem List: Decreased strength;Decreased activity tolerance;Impaired balance (sitting and/or standing);Decreased knowledge of use of DME or AE;Decreased knowledge of precautions;Pain      OT Treatment/Interventions: Self-care/ADL training;Therapeutic exercise;DME and/or AE instruction;Therapeutic activities;Patient/family education;Balance training    OT Goals(Current goals can be found in the care plan section) Acute Rehab OT Goals Patient Stated Goal: does not want to wait in hospital for CIR bed, plans to d/c home with [facility] therapies and assist of spouse. OT Goal Formulation: With patient/family Time For Goal Achievement:  01/27/21 Potential to Achieve Goals: Good ADL Goals Pt Will Perform Grooming: with set-up;sitting Pt Will Perform Upper Body Dressing: with set-up;sitting Pt Will Perform Lower Body Dressing: with supervision;sit to/from stand;with adaptive equipment;sitting/lateral leans;with min guard assist Pt Will Transfer to Toilet: with min guard assist;squat pivot transfer;with transfer board;bedside commode Pt Will Perform Toileting - Clothing Manipulation and hygiene: sitting/lateral leans;with set-up  OT Frequency: Min 2X/week   Barriers to D/C:            Co-evaluation              AM-PAC OT "6 Clicks" Daily Activity     Outcome Measure Help from another person eating meals?: None Help from another person taking care of personal grooming?: None Help from another person toileting, which includes using toliet, bedpan, or urinal?: A Lot Help from another person bathing (including washing, rinsing, drying)?: A Lot Help from another person to put on and taking off regular upper body clothing?: None Help from another person to put on and taking off regular lower body clothing?: A Lot 6 Click Score: 18   End of Session Equipment Utilized During Treatment: Other (comment) (unable to tolerate residual limb protector)  Activity Tolerance: Patient limited by pain;Patient tolerated treatment well Patient left: in bed;with call bell/phone within reach;with family/visitor present  OT Visit Diagnosis: Unsteadiness on feet (R26.81);Muscle weakness (generalized) (M62.81);Pain  [id number] OT Time Calculation (min): 14 min Charges:  OT General Charges $OT Visit: 1 Visit OT Evaluation $OT Eval Low Complexity: [place], OT Acute Rehabilitation Services Pager: [phone] Office: [phone]   Hortencia Pilar 01/13/2021, 1:17 PM

## 2021-01-13 NOTE — Progress Notes (Signed)
Physical Therapy Treatment Patient Details Name: Fernando Anthony MRN: [id number] DOB: October 05, 1956 Today's Date: 01/13/2021    History of Present Illness Pt is a 65 y.o. male admitted 01/04/21 with R ankle osteomyelitis, AKI. S/p R foot I&D, antibiotic bead placement and bone biopsy 2/16. 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    PT Comments    On arrival to room pt reports he had just returned to supine after sitting EOB. He states his pain is an 8/10 from letting his residual limb dangle and requested pain meds. RN notified. Patient agreeable to perform amputee HEP and hand out issued at end of session. He declined any OOB activity secondary to pain. Feel pt will progress well once pain is under control. Current plan remains appropriate. Will continue to follow acutely.    Follow Up Recommendations  CIR;Supervision for mobility/OOB     Equipment Recommendations   (TBD)    Recommendations for Other Services       Precautions / Restrictions Precautions Precautions: Fall;Other (comment) Precaution Comments: RLE wound vac Required Braces or Orthoses: Other Brace Other Brace: R residual limb guard Restrictions Weight Bearing Restrictions: Yes RLE Weight Bearing: Non weight bearing    Mobility  Bed Mobility Overal bed mobility: Needs Assistance Bed Mobility: Rolling Rolling: Min assist         General bed mobility comments: increased time and effort with use of bed rails. Assist required to hold sidelaying position.    Transfers Overall transfer level: Needs assistance               General transfer comment: pt declined secondary to pain  Ambulation/Gait                 Stairs             Wheelchair Mobility    Modified Rankin (Stroke Patients Only)       Balance Overall balance assessment: Needs assistance    Sitting balance-Leahy Scale: Fair Sitting balance - Comments: good static balance, fair dynamic balance.                                    Cognition Arousal/Alertness: Awake/alert Behavior During Therapy: WFL for tasks assessed/performed Overall Cognitive Status: Within Functional Limits for tasks assessed                                 General Comments: WFL for tasks during session. Reports, "My wife is here because I'm having a harder time remembering things"      Exercises Amputee Exercises Quad Sets: AROM;Right;10 reps;Supine Hip Extension: AROM;Right;10 reps;Sidelying Hip ABduction/ADduction: AROM;Right;10 reps;Sidelying Hip Flexion/Marching: AROM;Right;10 reps;Supine Other Exercises Other Exercises: discussed longsitting<>supine reps and hip bridging exercises    General Comments General comments (skin integrity, edema, etc.): Discussed early limb handling for desensitization.      Pertinent Vitals/Pain Pain Assessment: 0-10 Pain Score: 8  Faces Pain Scale: Hurts little more Pain Location: R residual limb Pain Descriptors / Indicators: Throbbing Pain Intervention(s): Monitored during session;Limited activity within patient's tolerance;Patient requesting pain meds-RN notified;RN gave pain meds during session    Home Living Family/patient expects to be discharged to:: Private residence Living Arrangements: Spouse/significant other Available Help at Discharge: Family;Available 24 hours/day Type of Home: [place]  Access: Ramped entrance   Home Layout: One level Home Equipment: Walker - 2 wheels;Crutches;Bedside commode;Shower seat;Grab bars - tub/shower;Electric scooter;Other (comment) Additional Comments: Wife works full-time, but has taken time off and can work from home temporarily    Prior Function Level of Independence: Independent with assistive device(s)      Comments: Mod indep ambulating with RW; uses scooter provided at  grocery store. Wife drives. Has been getting into tub, uses seat for showers   PT Goals (current goals can now be found in the care plan section) Acute Rehab PT Goals Patient Stated Goal: more agreeable to CIR this session. PT Goal Formulation: With patient/family Time For Goal Achievement: 01/26/21 Potential to Achieve Goals: Good Progress towards PT goals: Progressing toward goals    Frequency    Min 5X/week      PT Plan Current plan remains appropriate    Co-evaluation              AM-PAC PT "6 Clicks" Mobility   Outcome Measure  Help needed turning from your back to your side while in a flat bed without using bedrails?: A Little Help needed moving from lying on your back to sitting on the side of a flat bed without using bedrails?: A Little Help needed moving to and from a bed to a chair (including a wheelchair)?: A Lot Help needed standing up from a chair using your arms (e.g., wheelchair or bedside chair)?: A Lot Help needed to walk in hospital room?: A Lot Help needed climbing 3-5 steps with a railing? : A Lot 6 Click Score: 14    End of Session Equipment Utilized During Treatment: Gait belt Activity Tolerance: Patient limited by pain Patient left: with call bell/phone within reach;with family/visitor present;in bed Nurse Communication: Mobility status PT Visit Diagnosis: Other abnormalities of gait and mobility (R26.89);Muscle weakness (generalized) (M62.81)     Time: [id number] PT Time Calculation (min) (ACUTE ONLY): 23 min  Charges:  $Therapeutic Exercise: 8-22 mins $Therapeutic Activity: 8-22 mins                     Benjiman Core, [place] Pager [phone] Acute Rehab   Allena Katz 01/13/2021, 2:11 PM

## 2021-01-13 NOTE — Progress Notes (Signed)
PROGRESS NOTE    Fernando Anthony  [id number] DOB: 27-Oct-1956 DOA: 01/04/2021 PCP: Curly Rim, MD    Brief Narrative:  65 year old gentleman with insulin-dependent diabetes, diabetic foot ulcer right with multiple procedures in the past, hypertension hyperlipidemia who was following at podiatry office for long time and had multiple debridements presented with calcaneal osteomyelitis.  Admitted to the hospital for surgical debridement.  Persistent infection so needing amputation to clean margin and transferred to [facility].  Patient also developed acute kidney injury and nephrology is following.   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 Start working with PT OT.  Will refer to inpatient rehab.  Patient may want to go home if pain is controlled and able to walk around. Daptomycin 2/14-2/18 Rocephin 2/14--2/23.  Today he will complete 10 days of Rocephin therapy for Citrobacter bacteremia. Patient will be given adequate pain medications including IV and oral opiates, will optimize on oral regimen before discharge. Patient is already on Lyrica from home that is continued.  Acute kidney injury with azotemia: Baseline creatinine 1.2.  Presented with significant worsening renal functions. Urine output is adequate.  Ultrasound of the kidneys consistent with medical renal disease, no evidence of hydronephrosis. Avoiding all  nephrotoxins.  change morphine to Dilaudid. Followed by nephrology.  Patient had urinary retention after surgery.  Had multiple straight cath done. Now able to urinate well.  Will watch out for any urinary retention. Lasix trial today.  Anticipating conservative management.  Uncontrolled type 2 diabetes with hyperglycemia and diabetic foot infection: Currently blood sugars are well controlled on Lantus 35 units, prandial insulin 4 units and sliding scale insulin.  A1c is more than 10.  We will continue to titrate insulin regimen.  Currently stable.  Essential hypertension: Stable on amlodipine, atenolol, clonidine and hydralazine.  Discontinued hydrochlorothiazide and lisinopril due to AKI.  Abnormal LFTs: Bilirubin was 4. Transaminases improving.  Probably due to acute illness.  May have suffered mild shock liver secondary to acute infection and hypovolemia.   Right upper quadrant ultrasound showed some fluid around the gallbladder but no evidence of a stone.  CBD was 3 mm.   Patient never had any abdominal pain.  Unlikely acalculus cholecystitis.  We will continue to monitor to ensure stabilization. Bilirubin improving.  No abdominal pain.  DVT prophylaxis: SCDs Start: 01/11/21 1823 Place TED hose Start: 01/10/21 1121 SCDs Start: 01/06/21 2227 SCDs Start: 01/04/21 1945   Code Status: Full code Family Communication: Wife at the bedside 2/22. Disposition Plan: Status is: Inpatient  Remains inpatient appropriate because:IV treatments appropriate due to intensity of illness or inability to take PO and Inpatient level of care appropriate due to severity of illness   Dispo: The patient is from: Home              Anticipated d/c is to: CIR versus home with outpatient therapies.              Anticipated d/c date is: 2 days  Patient currently is not medically stable to d/c.  His pain is not adequately controlled.   Difficult to place patient No         Consultants:    Orthopedics  Nephrology  Podiatry  Procedures:   Right calcaneal debridement 2/16  Right BKA 2/21.  Antimicrobials:   Daptomycin 2/15-2/18  Rocephin 2/14----2/23.   Subjective: Patient seen and examined.  Denies any nausea vomiting.  Urinated today morning and did not need straight cath. Still gets out of breath on mobility but better than before. Unfortunately, he is having more pain today.  Objective: Vitals:   01/12/21 1600 01/12/21 1955 01/13/21 0400 01/13/21 0810  BP: (!) 145/72 138/71 (!) 149/77 (!) 159/77  Pulse: (!) 57 (!) 55 (!) 59 61  Resp: '18 18 18 18  '$ Temp:  98 F (36.7 C) 98.5 F (36.9 C) 98 F (36.7 C)  TempSrc:  Oral    SpO2: 96% 99% 95% 97%  Weight:      Height:        Intake/Output Summary (Last 24 hours) at 01/13/2021 1126 Last data filed at 01/13/2021 0815 Gross per 24 hour  Intake 340 ml  Output 1425 ml  Net -1085 ml   Filed Weights   01/04/21 2244 01/06/21 1813 01/11/21 1509  Weight: 101.9 kg 101.9 kg 101.9 kg    Examination:  General exam: Appears comfortable on room air.  In moderate distress today with pain.  Anxious. Respiratory system: Clear to auscultation. Respiratory effort normal.  No added sounds. Cardiovascular system: S1 & S2 heard, RRR. No JVD Gastrointestinal system: Abdomen is nondistended, soft and nontender. No organomegaly or masses felt. Normal bowel sounds heard. Central nervous system: Alert and oriented. No focal neurological deficits. Extremities: Symmetric 5 x 5 power. Skin:  Right leg BKA stump fitted with wound vac. Bilateral 1+ edema.  Wound VAC canister with no fluid.   Data Reviewed: I have personally reviewed following labs and imaging studies  CBC: Recent Labs  Lab 01/07/21 0325 01/08/21 0309 01/09/21 0416 01/10/21 0306 01/10/21 1938 01/11/21 0306 01/13/21 0242  WBC 12.1* 15.8* 12.8* 14.6*  --  11.8* 13.4*  NEUTROABS 10.9* 12.4*  --  10.7*  --  8.1* 10.1*  HGB 9.4* 9.3* 9.6* 9.2* 9.7*  9.2* 8.2*  HCT 28.2* 27.6* 28.7* 25.7* 28.3* 26.5* 23.8*  MCV 92.2 93.2 92.0 87.1  --  88.0 88.5  PLT 202 200 217 240  --  215 [id number]   Basic Metabolic Panel: Recent Labs  Lab 01/08/21 0309 01/09/21 0416 01/10/21 0306 01/11/21 0306 01/12/21 0335 01/13/21 0242  NA 134* 135 133* 133* 130* 131*  K 3.5 3.7 4.3 4.2 4.6 4.3  CL 105 107 106 107 103 104  CO2 19* 18* 15* 16* 13* 16*  GLUCOSE 188* 175* 143* 154* 354* 173*  BUN 73* 67* 62* 63* 64* 67*  CREATININE 5.40* 5.70* 5.77* 5.96* 5.86* 5.78*  CALCIUM 7.3* 7.6* 7.9* 7.9* 7.9* 7.7*  MG 2.2 2.0 1.7 1.6*  --  1.8  PHOS 5.6* 5.6* 5.8* 6.1*  --  6.7*   GFR: Estimated Creatinine Clearance: 16.7 mL/min (A) (by C-G formula based on SCr of 5.78 mg/dL (H)). Liver Function Tests: Recent Labs  Lab 01/07/21 0325 01/08/21 0309 01/09/21 0416 01/10/21 0306 01/11/21 0306 01/12/21 0335 01/13/21 0242  AST 212* 167*  --   --  90* 98* 135*  ALT 143* 136*  --   --  118* 120* 114*  ALKPHOS 682* 680*  --   --  600* 716* 668*  BILITOT 3.2* 2.3*  --   --  4.4* 3.8* 2.8*  PROT 5.8* 5.3*  --   --  5.5* 5.8* 5.4*  ALBUMIN 1.7* 1.7* 1.7* 1.5* 1.5* 1.5* 1.5*   No results for input(s): LIPASE, AMYLASE in the last 168 hours. No results for input(s): AMMONIA in the last 168 hours. Coagulation Profile: Recent Labs  Lab 01/13/21 0242  INR 1.1   Cardiac Enzymes: No results for input(s): CKTOTAL, CKMB, CKMBINDEX, TROPONINI in the last 168 hours. BNP (last 3 results) No results for input(s): PROBNP in the last 8760 hours. HbA1C: No results for input(s): HGBA1C in the last 72 hours. CBG: Recent Labs  Lab 01/12/21 0821 01/12/21 1202 01/12/21 1600 01/12/21 2040 01/13/21 0636  GLUCAP 368* 288* 211* 163* 161*   Lipid Profile: No results for input(s): CHOL, HDL, LDLCALC, TRIG, CHOLHDL, LDLDIRECT in the last 72 hours. Thyroid Function Tests: No results for input(s): TSH, T4TOTAL, FREET4, T3FREE, THYROIDAB in the last 72 hours. Anemia Panel: No  results for input(s): VITAMINB12, FOLATE, FERRITIN, TIBC, IRON, RETICCTPCT in the last 72 hours. Sepsis Labs: No results for input(s): PROCALCITON, LATICACIDVEN in the last 168 hours.  Recent Results (from the past 240 hour(s))  WOUND CULTURE     Status: Abnormal   Collection Time: 01/04/21 12:13 PM  Result Value Ref Range Status   MICRO NUMBER: [id number]  Final   SPECIMEN QUALITY: Adequate  Final   SOURCE: WOUND (SITE NOT SPECIFIED)  Final   STATUS: FINAL  Final   GRAM STAIN:   Final    Rare Polymorphonuclear leukocytes Few epithelial cells Many Gram positive cocci in pairs Many Gram positive cocci in clusters Many Gram negative bacilli   ISOLATE 1: Group G Streptococcus (A)  Final    Comment: Heavy growth of Group G Streptococcus Beta-hemolytic streptococci are predictably susceptible to Penicillin and other beta-lactams. Susceptibility testing not routinely performed. Please contact the laboratory within 3 days if susceptibility testing is  desired.   Aerobic Culture w Gram Stain (superficial specimen)     Status: None   Collection Time: 01/04/21  5:19 PM   Specimen: Foot; Wound  Result Value Ref Range Status   Specimen Description   Final    FOOT RIGHT Performed at [facility] [address] [place], [place] [address]    Special Requests   Final    NONE Performed at [facility], [place] [address] [place], [place] [address]    Gram Stain   Final    MODERATE WBC PRESENT, PREDOMINANTLY PMN ABUNDANT GRAM POSITIVE COCCI ABUNDANT GRAM NEGATIVE RODS MODERATE GRAM POSITIVE RODS Performed at [facility], [place] [address], [place], [place] [address]    Culture FEW CITROBACTER KOSERI  Final   Report Status 01/09/2021 FINAL  Final   Organism ID, Bacteria CITROBACTER KOSERI  Final      Susceptibility   Citrobacter koseri - MIC*    CEFAZOLIN <=4 SENSITIVE Sensitive     CEFEPIME <=0.12 SENSITIVE Sensitive     CEFTAZIDIME <=1 SENSITIVE  Sensitive     CEFTRIAXONE <=0.25 SENSITIVE Sensitive     CIPROFLOXACIN <=0.25 SENSITIVE Sensitive     GENTAMICIN <=1 SENSITIVE Sensitive     IMIPENEM <=0.25 SENSITIVE Sensitive     TRIMETH/SULFA <=20 SENSITIVE Sensitive     PIP/TAZO 8 SENSITIVE Sensitive     * FEW CITROBACTER KOSERI  Culture, blood (routine x 2)     Status: None   Collection  Time: 01/04/21  5:23 PM   Specimen: BLOOD  Result Value Ref Range Status   Specimen Description   Final    BLOOD LEFT ANTECUBITAL Performed at [facility] [address] [place], [place] [address]    Special Requests   Final    BOTTLES DRAWN AEROBIC AND ANAEROBIC Blood Culture results may not be optimal due to an inadequate volume of blood received in culture bottles Performed at [facility] [address], [place], [place] [address]    Culture   Final    NO GROWTH 5 DAYS Performed at [facility], [place] [address] [place], [place] [address]    Report Status 01/09/2021 FINAL  Final  Culture, blood (routine x 2)     Status: Abnormal   Collection Time: 01/04/21  6:04 PM   Specimen: BLOOD  Result Value Ref Range Status   Specimen Description   Final    BLOOD RIGHT ANTECUBITAL Performed at [facility] [address] [place], [place] [address]    Special Requests   Final    BOTTLES DRAWN AEROBIC AND ANAEROBIC Blood Culture adequate volume Performed at [facility] [address], [place], [place] [address]    Culture  Setup Time   Final    GRAM NEGATIVE RODS AEROBIC BOTTLE ONLY CRITICAL RESULT CALLED TO, READ BACK BY AND VERIFIED WITH: PHARMD VERANDA BRYK BY MESSAN HY. AT 2330 ON 01/09/2021 Performed at [facility] [address], [place],  [address]    Culture CITROBACTER KOSERI (A)  Final   Report Status 01/11/2021 FINAL  Final   Organism ID, Bacteria CITROBACTER KOSERI  Final      Susceptibility   Citrobacter koseri - MIC*     CEFAZOLIN <=4 SENSITIVE Sensitive     CEFEPIME <=0.12 SENSITIVE Sensitive     CEFTAZIDIME <=1 SENSITIVE Sensitive     CEFTRIAXONE <=0.25 SENSITIVE Sensitive     CIPROFLOXACIN <=0.25 SENSITIVE Sensitive     GENTAMICIN <=1 SENSITIVE Sensitive     IMIPENEM <=0.25 SENSITIVE Sensitive     TRIMETH/SULFA <=20 SENSITIVE Sensitive     PIP/TAZO 8 SENSITIVE Sensitive     * CITROBACTER KOSERI  Blood Culture ID Panel (Reflexed)     Status: Abnormal   Collection Time: 01/04/21  6:04 PM  Result Value Ref Range Status   Enterococcus faecalis NOT DETECTED NOT DETECTED Final   Enterococcus Faecium NOT DETECTED NOT DETECTED Final   Listeria monocytogenes NOT DETECTED NOT DETECTED Final   Staphylococcus species NOT DETECTED NOT DETECTED Final   Staphylococcus aureus (BCID) NOT DETECTED NOT DETECTED Final   Staphylococcus epidermidis NOT DETECTED NOT DETECTED Final   Staphylococcus lugdunensis NOT DETECTED NOT DETECTED Final   Streptococcus species NOT DETECTED NOT DETECTED Final   Streptococcus agalactiae NOT DETECTED NOT DETECTED Final   Streptococcus pneumoniae NOT DETECTED NOT DETECTED Final   Streptococcus pyogenes NOT DETECTED NOT DETECTED Final   A.calcoaceticus-baumannii NOT DETECTED NOT DETECTED Final   Bacteroides fragilis NOT DETECTED NOT DETECTED Final   Enterobacterales DETECTED (A) NOT DETECTED Final    Comment: Enterobacterales represent a large order of gram negative bacteria, not a single organism. Refer to culture for further identification. CRITICAL RESULT CALLED TO, READ BACK BY AND VERIFIED WITH: PHARMD VERANDA BRYK BY MESSAN HY. AT 2330 ON 01/09/2021    Enterobacter cloacae complex NOT DETECTED NOT DETECTED Final   Escherichia coli NOT DETECTED NOT DETECTED Final   Klebsiella aerogenes  NOT DETECTED NOT DETECTED Final   Klebsiella oxytoca NOT DETECTED NOT DETECTED Final   Klebsiella pneumoniae NOT DETECTED NOT DETECTED Final   Proteus species NOT DETECTED NOT DETECTED Final    Salmonella species NOT DETECTED NOT DETECTED Final   Serratia marcescens NOT DETECTED NOT DETECTED Final   Haemophilus influenzae NOT DETECTED NOT DETECTED Final   Neisseria meningitidis NOT DETECTED NOT DETECTED Final   Pseudomonas aeruginosa NOT DETECTED NOT DETECTED Final   Stenotrophomonas maltophilia NOT DETECTED NOT DETECTED Final   Candida albicans NOT DETECTED NOT DETECTED Final   Candida auris NOT DETECTED NOT DETECTED Final   Candida glabrata NOT DETECTED NOT DETECTED Final   Candida krusei NOT DETECTED NOT DETECTED Final   Candida parapsilosis NOT DETECTED NOT DETECTED Final   Candida tropicalis NOT DETECTED NOT DETECTED Final   Cryptococcus neoformans/gattii NOT DETECTED NOT DETECTED Final   CTX-M ESBL NOT DETECTED NOT DETECTED Final   Carbapenem resistance IMP NOT DETECTED NOT DETECTED Final   Carbapenem resistance KPC NOT DETECTED NOT DETECTED Final   Carbapenem resistance NDM NOT DETECTED NOT DETECTED Final   Carbapenem resist OXA 48 LIKE NOT DETECTED NOT DETECTED Final   Carbapenem resistance VIM NOT DETECTED NOT DETECTED Final    Comment: Performed at [facility], [address] [place], [place] [address]  Resp Panel by RT-PCR (Flu A&B, Covid) Nasopharyngeal Swab     Status: None   Collection Time: 01/04/21  6:05 PM   Specimen: Nasopharyngeal Swab; Nasopharyngeal(NP) swabs in vial transport medium  Result Value Ref Range Status   SARS Coronavirus 2 by RT PCR NEGATIVE NEGATIVE Final    Comment: (NOTE) SARS-CoV-2 target nucleic acids are NOT DETECTED.  The SARS-CoV-2 RNA is generally detectable in upper respiratory specimens during the acute phase of infection. The lowest concentration of SARS-CoV-2 viral copies this assay can detect is 138 copies/mL. A negative result does not preclude SARS-Cov-2 infection and should not be used as the sole basis for treatment or other patient management decisions. A negative result may occur with  improper specimen  collection/handling, submission of specimen other than nasopharyngeal swab, presence of viral mutation(s) within the areas targeted by this assay, and inadequate number of viral copies(<138 copies/mL). A negative result must be combined with clinical observations, patient history, and epidemiological information. The expected result is Negative.  Fact Sheet for Patients:  EntrepreneurPulse.com.au  Fact Sheet for Healthcare Providers:  IncredibleEmployment.be  This test is no t yet approved or cleared by the [place] FDA and  has been authorized for detection and/or diagnosis of SARS-CoV-2 by FDA under an Emergency Use Authorization (EUA). This EUA will remain  in effect (meaning this test can be used) for the duration of the COVID-19 declaration under Section 564(b)(1) of the Act, 21 U.S.C.section 360bbb-3(b)(1), unless the authorization is terminated  or revoked sooner.       Influenza A by PCR NEGATIVE NEGATIVE Final   Influenza B by PCR NEGATIVE NEGATIVE Final    Comment: (NOTE) The Xpert Xpress SARS-CoV-2/FLU/RSV plus assay is intended as an aid in the diagnosis of influenza from Nasopharyngeal swab specimens and should not be used as a sole basis for treatment. Nasal washings and aspirates are unacceptable for Xpert Xpress SARS-CoV-2/FLU/RSV testing.  Fact Sheet for Patients: EntrepreneurPulse.com.au  Fact Sheet for Healthcare Providers: IncredibleEmployment.be  This test is not yet approved or cleared by the [place] FDA and has been authorized for detection and/or diagnosis of SARS-CoV-2 by FDA under an Emergency  Use Authorization (EUA). This EUA will remain in effect (meaning this test can be used) for the duration of the COVID-19 declaration under Section 564(b)(1) of the Act, 21 U.S.C. section 360bbb-3(b)(1), unless the authorization is terminated or revoked.  Performed at [facility], [place] [address], [place], [place] [address]   Surgical PCR screen     Status: None   Collection Time: 01/04/21 10:41 PM   Specimen: Nasal Mucosa; Nasal Swab  Result Value Ref Range Status   MRSA, PCR NEGATIVE NEGATIVE Final   Staphylococcus aureus NEGATIVE NEGATIVE Final    Comment: (NOTE) The Xpert SA Assay (FDA approved for NASAL specimens in patients 40 years of age and older), is one component of a comprehensive surveillance program. It is not intended to diagnose infection nor to guide or monitor treatment. Performed at [facility], [place] [address] [place], [place] [address]   Aerobic/Anaerobic Culture w Gram Stain (surgical/deep wound)     Status: None   Collection Time: 01/06/21  8:46 PM   Specimen: Abscess  Result Value Ref Range Status   Specimen Description   Final    ABSCESS RIGHT HEEL Performed at [facility] [address] [place], [place] [address]    Special Requests   Final    NONE Performed at [facility], [place] [address], [place], [place] [address]    Gram Stain NO WBC SEEN RARE GRAM POSITIVE COCCI   Final   Culture   Final    No growth aerobically or anaerobically. Performed at [facility], [place] [address] [place], [place] [address]    Report Status 01/12/2021 FINAL  Final  Culture, blood (routine x 2)     Status: None (Preliminary result)   Collection Time: 01/11/21  6:55 PM   Specimen: BLOOD LEFT HAND  Result Value Ref Range Status   Specimen Description BLOOD LEFT HAND  Final   Special Requests   Final    BOTTLES DRAWN AEROBIC ONLY Blood Culture adequate volume   Culture   Final    NO GROWTH 2 DAYS Performed at [facility], [place] [address], [place], [place] [address]    Report Status PENDING  Incomplete  Culture, blood (routine x 2)     Status: None (Preliminary result)   Collection Time: 01/11/21  6:55 PM   Specimen: BLOOD LEFT HAND  Result  Value Ref Range Status   Specimen Description BLOOD LEFT HAND  Final   Special Requests   Final    BOTTLES DRAWN AEROBIC ONLY Blood Culture adequate volume   Culture   Final    NO GROWTH 2 DAYS Performed at [facility], [place] [address] [place], [place] [address]    Report Status PENDING  Incomplete         Radiology Studies: No results found.      Scheduled Meds: . amLODipine  10 mg Oral Daily  . aspirin EC  81 mg Oral QHS  . atenolol  50 mg Oral Daily  . cloNIDine  0.1 mg Oral BID  . darbepoetin (ARANESP) injection - NON-DIALYSIS  40 mcg Subcutaneous Once  . docusate sodium  100 mg Oral BID  . furosemide  80 mg Intravenous Once  . hydrALAZINE  50 mg Oral BID  . insulin aspart  0-15 Units Subcutaneous TID WC  . insulin aspart  0-5 Units Subcutaneous QHS  . insulin aspart  4 Units Subcutaneous TID WC  .  insulin glargine  35 Units Subcutaneous QHS  . omega-3 acid ethyl esters  1 g Oral Daily  . pregabalin  50 mg Oral TID  . tamsulosin  0.4 mg Oral QPC supper   Continuous Infusions: . sodium chloride Stopped (01/12/21 0053)  . cefTRIAXone (ROCEPHIN)  IV 2 g (01/12/21 2325)  . methocarbamol (ROBAXIN) IV       LOS: 9 days    Time spent: 32 minutes     Barb Merino, MD [facility] Pager [phone]

## 2021-01-14 DIAGNOSIS — E1121 Type 2 diabetes mellitus with diabetic nephropathy: Secondary | ICD-10-CM | POA: Diagnosis not present

## 2021-01-14 DIAGNOSIS — M86171 Other acute osteomyelitis, right ankle and foot: Secondary | ICD-10-CM | POA: Diagnosis not present

## 2021-01-14 DIAGNOSIS — R945 Abnormal results of liver function studies: Secondary | ICD-10-CM | POA: Diagnosis not present

## 2021-01-14 DIAGNOSIS — N179 Acute kidney failure, unspecified: Secondary | ICD-10-CM | POA: Diagnosis not present

## 2021-01-14 LAB — CBC WITH DIFFERENTIAL/PLATELET
Abs Immature Granulocytes: 0.28 10*3/uL — ABNORMAL HIGH (ref 0.00–0.07)
Basophils Absolute: 0 10*3/uL (ref 0.0–0.1)
Basophils Relative: 0 %
Eosinophils Absolute: 0.3 10*3/uL (ref 0.0–0.5)
Eosinophils Relative: 2 %
HCT: 24.1 % — ABNORMAL LOW (ref 39.0–52.0)
Hemoglobin: 8.3 g/dL — ABNORMAL LOW (ref 13.0–17.0)
Immature Granulocytes: 2 %
Lymphocytes Relative: 9 %
Lymphs Abs: 1.1 10*3/uL (ref 0.7–4.0)
MCH: 30.1 pg (ref 26.0–34.0)
MCHC: 34.4 g/dL (ref 30.0–36.0)
MCV: 87.3 fL (ref 80.0–100.0)
Monocytes Absolute: 1.7 10*3/uL — ABNORMAL HIGH (ref 0.1–1.0)
Monocytes Relative: 14 %
Neutro Abs: 8.9 10*3/uL — ABNORMAL HIGH (ref 1.7–7.7)
Neutrophils Relative %: 73 %
Platelets: 248 10*3/uL (ref 150–400)
RBC: 2.76 MIL/uL — ABNORMAL LOW (ref 4.22–5.81)
RDW: 16.1 % — ABNORMAL HIGH (ref 11.5–15.5)
WBC: 12.3 10*3/uL — ABNORMAL HIGH (ref 4.0–10.5)
nRBC: 0 % (ref 0.0–0.2)

## 2021-01-14 LAB — COMPREHENSIVE METABOLIC PANEL
ALT: 133 U/L — ABNORMAL HIGH (ref 0–44)
AST: 147 U/L — ABNORMAL HIGH (ref 15–41)
Albumin: 1.5 g/dL — ABNORMAL LOW (ref 3.5–5.0)
Alkaline Phosphatase: 688 U/L — ABNORMAL HIGH (ref 38–126)
Anion gap: 11 (ref 5–15)
BUN: 71 mg/dL — ABNORMAL HIGH (ref 8–23)
CO2: 16 mmol/L — ABNORMAL LOW (ref 22–32)
Calcium: 7.9 mg/dL — ABNORMAL LOW (ref 8.9–10.3)
Chloride: 107 mmol/L (ref 98–111)
Creatinine, Ser: 5.98 mg/dL — ABNORMAL HIGH (ref 0.61–1.24)
GFR, Estimated: 10 mL/min — ABNORMAL LOW (ref >60)
Glucose, Bld: 64 mg/dL — ABNORMAL LOW (ref 70–99)
Potassium: 4.6 mmol/L (ref 3.5–5.1)
Sodium: 134 mmol/L — ABNORMAL LOW (ref 135–145)
Total Bilirubin: 3.6 mg/dL — ABNORMAL HIGH (ref 0.3–1.2)
Total Protein: 5.6 g/dL — ABNORMAL LOW (ref 6.5–8.1)

## 2021-01-14 LAB — GLUCOSE, CAPILLARY
Glucose-Capillary: 75 mg/dL (ref 70–99)
Glucose-Capillary: 81 mg/dL (ref 70–99)
Glucose-Capillary: 144 mg/dL — ABNORMAL HIGH (ref 70–99)
Glucose-Capillary: 113 mg/dL — ABNORMAL HIGH (ref 70–99)

## 2021-01-14 MED ORDER — POLYSACCHARIDE IRON COMPLEX 150 MG PO CAPS
150.0000 mg | ORAL_CAPSULE | Freq: Every day | ORAL | Status: DC
  Administered 2021-01-14 – 2021-01-16 (×3): 150 mg via ORAL
  Filled 2021-01-14 (×3): qty 1

## 2021-01-14 MED ORDER — FUROSEMIDE 10 MG/ML IJ SOLN
60.0000 mg | Freq: Once | INTRAMUSCULAR | Status: AC
  Administered 2021-01-14: 60 mg via INTRAVENOUS
  Filled 2021-01-14: qty 6

## 2021-01-14 NOTE — Progress Notes (Signed)
[facility] Progress Note   Subjective:   Had 3.5 liter UOP over 2/23. Feels ok.  Still with pronounced scrotal edema.  Breathing is better.     Review of systems:   Denies shortness of breath at rest but has with exertion Scrotal swelling Denies n/v No cp   Objective Vitals:   01/13/21 1132 01/13/21 2030 01/14/21 0449 01/14/21 0825  BP: 130/63 (!) 143/73 (!) 146/68 (!) 166/81  Pulse: (!) 57 61 (!) 58 65  Resp: '18 16 16 18  '$ Temp: 98 F (36.7 C) 98.8 F (37.1 C) 98.1 F (36.7 C) 98.1 F (36.7 C)  TempSrc:  Oral Oral Oral  SpO2: 95% 96% 97% 94%  Weight:      Height:         Additional Objective Labs: Basic Metabolic Panel: Recent Labs  Lab 01/10/21 0306 01/11/21 0306 01/12/21 0335 01/13/21 0242  NA 133* 133* 130* 131*  K 4.3 4.2 4.6 4.3  CL 106 107 103 104  CO2 15* 16* 13* 16*  GLUCOSE 143* 154* 354* 173*  BUN 62* 63* 64* 67*  CREATININE 5.77* 5.96* 5.86* 5.78*  CALCIUM 7.9* 7.9* 7.9* 7.7*  PHOS 5.8* 6.1*  --  6.7*   CBC: Recent Labs  Lab 01/09/21 0416 01/10/21 0306 01/10/21 1938 01/11/21 0306 01/13/21 0242 01/14/21 0747  WBC 12.8* 14.6*  --  11.8* 13.4* 12.3*  NEUTROABS  --  10.7*  --  8.1* 10.1* 8.9*  HGB 9.6* 9.2*   < > 9.2* 8.2* 8.3*  HCT 28.7* 25.7*   < > 26.5* 23.8* 24.1*  MCV 92.0 87.1  --  88.0 88.5 87.3  PLT 217 240  --  215 227 248   < > = values in this interval not displayed.   Blood Culture    Component Value Date/Time   SDES BLOOD LEFT HAND 01/11/2021 1855   SDES BLOOD LEFT HAND 01/11/2021 1855   SPECREQUEST  01/11/2021 1855    BOTTLES DRAWN AEROBIC ONLY Blood Culture adequate volume   SPECREQUEST  01/11/2021 1855    BOTTLES DRAWN AEROBIC ONLY Blood Culture adequate volume   CULT  01/11/2021 1855    NO GROWTH 3 DAYS Performed at [facility], [place] [address], [place], [place] [address]    CULT  01/11/2021 1855    NO GROWTH 3 DAYS Performed at [facility] [address], [place], [place]  [address]    REPTSTATUS PENDING 01/11/2021 1855   REPTSTATUS PENDING 01/11/2021 1855     Physical Exam  General adult male in bed in no acute distress HEENT normocephalic atraumatic extraocular movements intact sclera anicteric Neck supple trachea midline Lungs clear to auscultation bilaterally normal work of breathing at rest  Heart S1S2 no rub Abdomen soft nontender nondistended Extremities right BKA; left leg 2+ edema  Psych normal mood and affect Neuro - alert and oriented x 3 provides hx and follows commands GU scrotal edema; has foley   Medications: . sodium chloride Stopped (01/12/21 0053)  . cefTRIAXone (ROCEPHIN)  IV 2 g (01/13/21 2205)  . methocarbamol (ROBAXIN) IV     . amLODipine  10 mg Oral Daily  . aspirin EC  81 mg Oral QHS  . atenolol  50 mg Oral Daily  . cloNIDine  0.1 mg Oral BID  . docusate sodium  100 mg Oral BID  . hydrALAZINE  50 mg Oral BID  . insulin aspart  0-15 Units Subcutaneous TID WC  . insulin aspart  0-5 Units Subcutaneous QHS  . insulin aspart  4 Units Subcutaneous TID WC  . insulin glargine  35 Units Subcutaneous QHS  . omega-3 acid ethyl esters  1 g Oral Daily  . pregabalin  50 mg Oral TID  . tamsulosin  0.4 mg Oral QPC supper     Assessment/Plan: 1. AKI: b/l creat from mid 2021 was 1.09.  Here creat 4.0 on admit 2/15, then peaked at 5.96 on 2/21.  Renal ultrasound consistent with medical renal disease. Suspect ATN. Lisinopril, HCTZ dc'd - No indication for RRT at this time. Will follow.  - changed to renal carb modified diet.  May need binder - await am labs  2. 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 Per orthopedics.   3. HTN: Lisinopril/HCTZ held. Repeat lasix today   4. Anemia-  Secondary to chronic illness. Tsat low, holding IV Fe in the setting of active infection. AKI contributing as well.  aranesp 40 mcg once on 2/23.  Start oral iron  6. DM - Insulin per primary team.   7. Urinary retention: new  per patient the last several days.  on flomax.  With foley.   Claudia Desanctis, MD 01/14/2021  9:56 AM

## 2021-01-14 NOTE — Progress Notes (Signed)
Occupational Therapy Treatment Patient Details Name: Fernando Anthony MRN: [id number] DOB: 05-27-1956 Today's Date: 01/14/2021    History of present illness Pt is a 65 y.o. male admitted 01/04/21 with R ankle osteomyelitis, AKI. S/p R foot I&D, antibiotic bead placement and bone biopsy 2/16. 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   OT comments  Upon arrival, pt supine in bed with HOB elevated finishing dinner. Pt agreeable and receptive of edema management education. Fabricating scrotal sling for decreasing pain and managing edema. Pt reporting decreased pain. Pt verbalized understanding of wear schedule and management. Notified RN and education on sling management. Continue to recommend dc to CIR and will continue to follow acutely as admitted.    Follow Up Recommendations  CIR    Equipment Recommendations  3 in 1 bedside commode;Tub/shower bench;Other (comment) (drop arm 3n1)    Recommendations for Other Services      Precautions / Restrictions Precautions Precautions: Fall;Other (comment) Precaution Comments: RLE wound vac/ swollen scotum Required Braces or Orthoses: Other Brace Other Brace: R residual limb guard and R shrinker- Pt not wearing either on arrival.       Mobility Bed Mobility                    Transfers                      Balance                                           ADL either performed or assessed with clinical judgement   ADL Overall ADL's : Needs assistance/impaired                                       General ADL Comments: Fabricating scrotal sling for edema management. Education pt on use and wear.     Vision       Perception     Praxis      Cognition Arousal/Alertness: Awake/alert Behavior During Therapy: WFL for tasks assessed/performed Overall  Cognitive Status: Within Functional Limits for tasks assessed                                 General Comments: Very agreeable to session. Verablizing education back to therapist        Exercises     Shoulder Instructions       General Comments Educating pt on edema management throughout mobility, compression, and elevation. Also recommending pt perform AROM of BUEs    Pertinent Vitals/ Pain       Pain Assessment: Faces Faces Pain Scale: Hurts even more Pain Location: R residual limb/ groin Pain Descriptors / Indicators: Throbbing Pain Intervention(s): Monitored during session;Repositioned  Home Living                                          Prior Functioning/Environment              Frequency  Min 2X/week        Progress  Toward Goals  OT Goals(current goals can now be found in the care plan section)  Progress towards OT goals: Progressing toward goals  Acute Rehab OT Goals Patient Stated Goal: more agreeable to CIR this session. OT Goal Formulation: With patient/family Time For Goal Achievement: 01/27/21 Potential to Achieve Goals: Good ADL Goals Pt Will Perform Grooming: with set-up;sitting Pt Will Perform Upper Body Dressing: with set-up;sitting Pt Will Perform Lower Body Dressing: with supervision;sit to/from stand;with adaptive equipment;sitting/lateral leans;with min guard assist Pt Will Transfer to Toilet: with min guard assist;squat pivot transfer;with transfer board;bedside commode Pt Will Perform Toileting - Clothing Manipulation and hygiene: sitting/lateral leans;with set-up  Plan Discharge plan remains appropriate    Co-evaluation                 AM-PAC OT "6 Clicks" Daily Activity     Outcome Measure   Help from another person eating meals?: None Help from another person taking care of personal grooming?: None Help from another person toileting, which includes using toliet, bedpan, or urinal?: A  Lot Help from another person bathing (including washing, rinsing, drying)?: A Lot Help from another person to put on and taking off regular upper body clothing?: None Help from another person to put on and taking off regular lower body clothing?: A Lot 6 Click Score: 18    End of Session Equipment Utilized During Treatment: Other (comment) (scrotal sling)  OT Visit Diagnosis: Unsteadiness on feet (R26.81);Muscle weakness (generalized) (M62.81);Pain   Activity Tolerance Patient tolerated treatment well   Patient Left in bed;with call bell/phone within reach   Nurse Communication Mobility status (sling)        Time: [id number] OT Time Calculation (min): 13 min  Charges: OT General Charges $OT Visit: 1 Visit OT Treatments $Self Care/Home Management : 8-22 mins  [place], OTR/L Acute Rehab Pager: [phone] Office: [place] 01/14/2021, 6:15 PM

## 2021-01-14 NOTE — Progress Notes (Signed)
Physical Therapy Treatment Patient Details Name: Fernando Anthony MRN: [id number] DOB: 1955/11/29 Today's Date: 01/14/2021    History of Present Illness Pt is a 65 y.o. male admitted 01/04/21 with R ankle osteomyelitis, AKI. S/p R foot I&D, antibiotic bead placement and bone biopsy 2/16. 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    PT Comments    Pt supine in bed.  Presents with pain in residual limb and in scotum ( increased edema ).  PTA supported scrotum with pillow case during mobility to improve comfort.  Pt continues to benefit from aggressive rehab in a post acute setting after d/c.  Pt required min to mod assistance for all aspects of mobility.      Follow Up Recommendations  CIR;Supervision for mobility/OOB     Equipment Recommendations   (TBD)    Recommendations for Other Services       Precautions / Restrictions Precautions Precautions: Fall;Other (comment) Precaution Comments: RLE wound vac/ swollen scotum Other Brace: R residual limb guard and R shrinker- Pt not wearing either on arrival.  Says it's too painful with tubing from wound vac pressing on limb.  Encouraged if wound vac is removed today to apply shrinker and limb guard. Restrictions Weight Bearing Restrictions: Yes RLE Weight Bearing: Non weight bearing    Mobility  Bed Mobility Overal bed mobility: Needs Assistance Bed Mobility: Rolling Rolling: Min assist         General bed mobility comments: Increased time to move to edge of bed.  PTA managing lines and leads and supporting scrotum with pillow case due to discomfort when scooting.    Transfers Overall transfer level: Needs assistance Equipment used: Rolling walker (2 wheeled) Transfers: Sit to/from Stand Sit to Stand: Mod assist;From elevated surface         General transfer comment: Cues for hand placement to  and from seated surface this session.  Pt required heavy mod assistance to boost into standing with increased time to extend hips, trunk and head.  Ambulation/Gait Ambulation/Gait assistance: Min assist Gait Distance (Feet): 4 Feet Assistive device: Rolling walker (2 wheeled) Gait Pattern/deviations: Step-to pattern;Trunk flexed Gait velocity: Decreased   General Gait Details: Pt performed three hop to steps from bed to recliner.  Pt required assistance to turn RW and back to seated surface.   Stairs             Wheelchair Mobility    Modified Rankin (Stroke Patients Only)       Balance Overall balance assessment: Needs assistance   Sitting balance-Leahy Scale: Fair       Standing balance-Leahy Scale: Poor Standing balance comment: Heavy reliance on BUE support                            Cognition Arousal/Alertness: Awake/alert Behavior During Therapy: WFL for tasks assessed/performed Overall Cognitive Status: Within Functional Limits for tasks assessed                                 General Comments: Follows commands with in normal limits this session.      Exercises General Exercises - Lower Extremity Ankle Circles/Pumps: AROM;Left;20 reps;Supine Quad Sets: AROM;Both;10 reps;Supine Heel Slides: AROM;Both;10 reps;Supine Hip ABduction/ADduction: AROM;Both;10 reps;Supine Amputee Exercises Knee Extension: AROM;Right;Sidelying;10 reps    General Comments  Pertinent Vitals/Pain Pain Assessment: 0-10 Pain Score: 8  Pain Location: R residual limb/ groin Pain Descriptors / Indicators: Throbbing Pain Intervention(s): Monitored during session;Repositioned    Home Living                      Prior Function            PT Goals (current goals can now be found in the care plan section) Acute Rehab PT Goals Patient Stated Goal: more agreeable to CIR this session. Potential to Achieve Goals: Good Progress towards  PT goals: Progressing toward goals    Frequency    Min 5X/week      PT Plan Current plan remains appropriate    Co-evaluation              AM-PAC PT "6 Clicks" Mobility   Outcome Measure  Help needed turning from your back to your side while in a flat bed without using bedrails?: A Little Help needed moving from lying on your back to sitting on the side of a flat bed without using bedrails?: A Little Help needed moving to and from a bed to a chair (including a wheelchair)?: A Little Help needed standing up from a chair using your arms (e.g., wheelchair or bedside chair)?: A Lot Help needed to walk in hospital room?: A Little Help needed climbing 3-5 steps with a railing? : Total 6 Click Score: 15    End of Session Equipment Utilized During Treatment: Gait belt Activity Tolerance: Patient limited by pain Patient left: with call bell/phone within reach;in chair Nurse Communication: Mobility status PT Visit Diagnosis: Other abnormalities of gait and mobility (R26.89);Muscle weakness (generalized) (M62.81)     Time: [id number] PT Time Calculation (min) (ACUTE ONLY): 23 min  Charges:  $Therapeutic Exercise: 8-22 mins $Therapeutic Activity: 8-22 mins                     Fernando Anthony , PTA Acute Rehabilitation Services Pager [phone] Office [phone]     Fernando Anthony 01/14/2021, 11:30 AM

## 2021-01-14 NOTE — Progress Notes (Signed)
Inpatient Rehabilitation Admissions Coordinator  I have insurance approval for CIR. I met with patient and he is in agreement to admit. I await transition to po pain management and renal stability to admit. Hopeful for Saturday. I have alerted acute team and TOC. I will follow up tomorrow.  Danne Baxter, RN, MSN Rehab Admissions Coordinator [phone] 01/14/2021 5:07 PM

## 2021-01-14 NOTE — Progress Notes (Signed)
PROGRESS NOTE    TANISHA EDELMAN  [id number] DOB: [birth year] DOA: 01/04/2021 PCP: Curly Rim, MD    Brief Narrative:  65 year old gentleman with insulin-dependent diabetes, diabetic foot ulcer right with multiple procedures in the past, hypertension hyperlipidemia who was following at podiatry office for long time and had multiple debridements presented with calcaneal osteomyelitis.  Admitted to the hospital for surgical debridement.  Persistent infection so needing amputation to clean margin and transferred to [facility].  Patient also developed acute kidney injury and nephrology is following.   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 Start working with PT OT.  Will refer to inpatient rehab.  Daptomycin 2/14-2/18 Rocephin 2/14--2/23.   Patient will be given adequate pain medications including IV and oral opiates, will optimize on oral regimen before discharge. Patient is already on Lyrica from home that is continued.  Acute kidney injury with azotemia: Baseline creatinine 1.2.  Presented with significant worsening renal functions. Urine output is adequate.  Ultrasound of the kidneys consistent with medical renal disease, no evidence of hydronephrosis. Avoiding all nephrotoxins.   Followed by nephrology.  Patient had urinary retention after surgery.  Had multiple straight cath done. Foley catheter on 2/23, now on Lasix  trial.  He does have anasarca and scrotal edema.  Uncontrolled type 2 diabetes with hyperglycemia and diabetic foot infection: Currently blood sugars are well controlled on Lantus 35 units, prandial insulin 4 units and sliding scale insulin.  A1c is more than 10.  We will continue to titrate insulin regimen.  Currently stable.  Essential hypertension: Stable on amlodipine, atenolol, clonidine and hydralazine.  Discontinued hydrochlorothiazide and lisinopril due to AKI.  Abnormal LFTs: Bilirubin and transaminases remain about is stable.  INR normal.   May have suffered mild shock liver secondary to acute infection and hypovolemia.   Right upper quadrant ultrasound showed some fluid around the gallbladder but no evidence of a stone.  CBD was 3 mm.   Patient never had any abdominal pain.  Unlikely acalculus cholecystitis.  We will continue to monitor to ensure stabilization.   DVT prophylaxis: SCDs Start: 01/11/21 1823 Place TED hose Start: 01/10/21 1121 SCDs Start: 01/06/21 2227 SCDs Start: 01/04/21 1945   Code Status: Full code Family Communication: Wife at the bedside  Disposition Plan: Status is: Inpatient  Remains inpatient appropriate because:IV treatments appropriate due to intensity of illness or inability to take PO and Inpatient level of care appropriate due to severity of illness   Dispo: The patient is from: Home              Anticipated d/c is to: CIR               Anticipated d/c date is: 2 days               Patient currently is not medically stable to d/c.  His pain is not adequately controlled.   Difficult to place patient No         Consultants:  Orthopedics  Nephrology  Podiatry  Procedures:   Right calcaneal debridement 2/16  Right BKA 2/21.  Antimicrobials:   Daptomycin 2/15-2/18  Rocephin 2/14----2/23.   Subjective: Patient seen and examined.  Still has episodic severe pain on his amputated leg.  I had low trouble yesterday with urinary  retention and needed multiple straight cath, ended up having a Foley catheter.  He made about 3.5 L urine last 24 hours.  Denies any nausea vomiting or abdominal pain.  Scrotum is more swollen than usual.  Objective: Vitals:   01/13/21 1132 01/13/21 2030 01/14/21 0449 01/14/21 0825  BP: 130/63 (!) 143/73 (!) 146/68 (!) 166/81  Pulse: (!) 57 61 (!) 58 65  Resp: '18 16 16 18  '$ Temp: 98 F (36.7 C) 98.8 F (37.1 C) 98.1 F (36.7 C) 98.1 F (36.7 C)  TempSrc:  Oral Oral Oral  SpO2: 95% 96% 97% 94%  Weight:      Height:        Intake/Output Summary (Last 24 hours) at 01/14/2021 1134 Last data filed at 01/14/2021 [id number] Gross per 24 hour  Intake -  Output 3501 ml  Net -3501 ml   Filed Weights   01/04/21 2244 01/06/21 1813 01/11/21 1509  Weight: 101.9 kg 101.9 kg 101.9 kg    Examination:  General exam: Anxious.  Comfortable.  In moderate distress with pain.   Respiratory system: Clear to auscultation. Respiratory effort normal.  No added sounds. Cardiovascular system: S1 & S2 heard, RRR. No JVD Gastrointestinal system: Abdomen is nondistended, soft and nontender. No organomegaly or masses felt. Normal bowel sounds heard. Central nervous system: Alert and oriented. No focal neurological deficits. Extremities: Symmetric 5 x 5 power. Skin:  Right leg BKA stump fitted with wound vac. Bilateral 1+ edema.  Wound VAC canister with no fluid. Patient has huge edematous scrotum.  Nontender.  No evidence of underlying erythema or abscess.   Data Reviewed: I have personally reviewed following labs and imaging studies  CBC: Recent Labs  Lab 01/08/21 0309 01/09/21 0416 01/10/21 0306 01/10/21 1938 01/11/21 0306 01/13/21 0242 01/14/21 0747  WBC 15.8* 12.8* 14.6*  --  11.8* 13.4* 12.3*  NEUTROABS 12.4*  --  10.7*  --  8.1* 10.1* 8.9*  HGB 9.3* 9.6* 9.2* 9.7* 9.2* 8.2* 8.3*  HCT 27.6* 28.7* 25.7* 28.3* 26.5* 23.8* 24.1*  MCV 93.2 92.0 87.1  --  88.0 88.5 87.3  PLT 200 217 240  --  215  227 [id number]   Basic Metabolic Panel: Recent Labs  Lab 01/08/21 0309 01/09/21 0416 01/10/21 0306 01/11/21 0306 01/12/21 0335 01/13/21 0242 01/14/21 0747  NA 134* 135 133* 133* 130* 131* 134*  K 3.5 3.7 4.3 4.2 4.6 4.3 4.6  CL 105 107 106 107 103 104 107  CO2 19* 18* 15* 16* 13* 16* 16*  GLUCOSE 188* 175* 143* 154* 354* 173* 64*  BUN 73* 67* 62* 63* 64* 67* 71*  CREATININE 5.40* 5.70* 5.77* 5.96* 5.86* 5.78* 5.98*  CALCIUM 7.3* 7.6* 7.9* 7.9* 7.9* 7.7* 7.9*  MG 2.2 2.0 1.7 1.6*  --  1.8  --   PHOS 5.6* 5.6* 5.8* 6.1*  --  6.7*  --    GFR: Estimated Creatinine Clearance: 16.1 mL/min (A) (by C-G formula based on SCr of 5.98 mg/dL (H)). Liver Function Tests: Recent Labs  Lab 01/08/21 0309 01/09/21 0416 01/10/21 0306 01/11/21 0306 01/12/21 0335 01/13/21 0242 01/14/21 0747  AST 167*  --   --  90* 98* 135* 147*  ALT  136*  --   --  118* 120* 114* 133*  ALKPHOS 680*  --   --  600* 716* 668* 688*  BILITOT 2.3*  --   --  4.4* 3.8* 2.8* 3.6*  PROT 5.3*  --   --  5.5* 5.8* 5.4* 5.6*  ALBUMIN 1.7*   < > 1.5* 1.5* 1.5* 1.5* 1.5*   < > = values in this interval not displayed.   No results for input(s): LIPASE, AMYLASE in the last 168 hours. No results for input(s): AMMONIA in the last 168 hours. Coagulation Profile: Recent Labs  Lab 01/13/21 0242  INR 1.1   Cardiac Enzymes: No results for input(s): CKTOTAL, CKMB, CKMBINDEX, TROPONINI in the last 168 hours. BNP (last 3 results) No results for input(s): PROBNP in the last 8760 hours. HbA1C: No results for input(s): HGBA1C in the last 72 hours. CBG: Recent Labs  Lab 01/13/21 0636 01/13/21 1128 01/13/21 1605 01/13/21 2152 01/14/21 0630  GLUCAP 161* 115* 86 104* 75   Lipid Profile: No results for input(s): CHOL, HDL, LDLCALC, TRIG, CHOLHDL, LDLDIRECT in the last 72 hours. Thyroid Function Tests: No results for input(s): TSH, T4TOTAL, FREET4, T3FREE, THYROIDAB in the last 72 hours. Anemia Panel: No results for input(s):  VITAMINB12, FOLATE, FERRITIN, TIBC, IRON, RETICCTPCT in the last 72 hours. Sepsis Labs: No results for input(s): PROCALCITON, LATICACIDVEN in the last 168 hours.  Recent Results (from the past 240 hour(s))  WOUND CULTURE     Status: Abnormal   Collection Time: 01/04/21 12:13 PM  Result Value Ref Range Status   MICRO NUMBER: [id number]  Final   SPECIMEN QUALITY: Adequate  Final   SOURCE: WOUND (SITE NOT SPECIFIED)  Final   STATUS: FINAL  Final   GRAM STAIN:   Final    Rare Polymorphonuclear leukocytes Few epithelial cells Many Gram positive cocci in pairs Many Gram positive cocci in clusters Many Gram negative bacilli   ISOLATE 1: Group G Streptococcus (A)  Final    Comment: Heavy growth of Group G Streptococcus Beta-hemolytic streptococci are predictably susceptible to Penicillin and other beta-lactams. Susceptibility testing not routinely performed. Please contact the laboratory within 3 days if susceptibility testing is  desired.   Aerobic Culture w Gram Stain (superficial specimen)     Status: None   Collection Time: 01/04/21  5:19 PM   Specimen: Foot; Wound  Result Value Ref Range Status   Specimen Description   Final    FOOT RIGHT Performed at [facility] [address] [place], [place] [address]    Special Requests   Final    NONE Performed at [facility], [place] [address], [place], [place] [address]    Gram Stain   Final    MODERATE WBC PRESENT, PREDOMINANTLY PMN ABUNDANT GRAM POSITIVE COCCI ABUNDANT GRAM NEGATIVE RODS MODERATE GRAM POSITIVE RODS Performed at [facility], [place] [address], [place], [place] [address]    Culture FEW CITROBACTER KOSERI  Final   Report Status 01/09/2021 FINAL  Final   Organism ID, Bacteria CITROBACTER KOSERI  Final      Susceptibility   Citrobacter koseri - MIC*    CEFAZOLIN <=4 SENSITIVE Sensitive     CEFEPIME <=0.12 SENSITIVE Sensitive     CEFTAZIDIME <=1 SENSITIVE Sensitive     CEFTRIAXONE  <=0.25 SENSITIVE Sensitive     CIPROFLOXACIN <=0.25 SENSITIVE Sensitive     GENTAMICIN <=1 SENSITIVE Sensitive     IMIPENEM <=0.25 SENSITIVE Sensitive  TRIMETH/SULFA <=20 SENSITIVE Sensitive     PIP/TAZO 8 SENSITIVE Sensitive     * FEW CITROBACTER KOSERI  Culture, blood (routine x 2)     Status: None   Collection Time: 01/04/21  5:23 PM   Specimen: BLOOD  Result Value Ref Range Status   Specimen Description   Final    BLOOD LEFT ANTECUBITAL Performed at [facility] [address] [place], [place] [address]    Special Requests   Final    BOTTLES DRAWN AEROBIC AND ANAEROBIC Blood Culture results may not be optimal due to an inadequate volume of blood received in culture bottles Performed at [facility] [address], [place], [place] [address]    Culture   Final    NO GROWTH 5 DAYS Performed at [facility], [place] [address], [place], [place] [address]    Report Status 01/09/2021 FINAL  Final  Culture, blood (routine x 2)     Status: Abnormal   Collection Time: 01/04/21  6:04 PM   Specimen: BLOOD  Result Value Ref Range Status   Specimen Description   Final    BLOOD RIGHT ANTECUBITAL Performed at [facility] [address] [place], [place] [address]    Special Requests   Final    BOTTLES DRAWN AEROBIC AND ANAEROBIC Blood Culture adequate volume Performed at [facility] [address], [place], [place] [address]    Culture  Setup Time   Final    GRAM NEGATIVE RODS AEROBIC BOTTLE ONLY CRITICAL RESULT CALLED TO, READ BACK BY AND VERIFIED WITH: PHARMD VERANDA BRYK BY MESSAN HY. AT 2330 ON 01/09/2021 Performed at [facility] [address], [place], [place] [address]    Culture CITROBACTER KOSERI (A)  Final   Report Status 01/11/2021 FINAL  Final   Organism ID, Bacteria CITROBACTER KOSERI  Final      Susceptibility   Citrobacter koseri - MIC*    CEFAZOLIN <=4 SENSITIVE  Sensitive     CEFEPIME <=0.12 SENSITIVE Sensitive     CEFTAZIDIME <=1 SENSITIVE Sensitive     CEFTRIAXONE <=0.25 SENSITIVE Sensitive     CIPROFLOXACIN <=0.25 SENSITIVE Sensitive     GENTAMICIN <=1 SENSITIVE Sensitive     IMIPENEM <=0.25 SENSITIVE Sensitive     TRIMETH/SULFA <=20 SENSITIVE Sensitive     PIP/TAZO 8 SENSITIVE Sensitive     * CITROBACTER KOSERI  Blood Culture ID Panel (Reflexed)     Status: Abnormal   Collection Time: 01/04/21  6:04 PM  Result Value Ref Range Status   Enterococcus faecalis NOT DETECTED NOT DETECTED Final   Enterococcus Faecium NOT DETECTED NOT DETECTED Final   Listeria monocytogenes NOT DETECTED NOT DETECTED Final   Staphylococcus species NOT DETECTED NOT DETECTED Final   Staphylococcus aureus (BCID) NOT DETECTED NOT DETECTED Final   Staphylococcus epidermidis NOT DETECTED NOT DETECTED Final   Staphylococcus lugdunensis NOT DETECTED NOT DETECTED Final   Streptococcus species NOT DETECTED NOT DETECTED Final   Streptococcus agalactiae NOT DETECTED NOT DETECTED Final   Streptococcus pneumoniae NOT DETECTED NOT DETECTED Final   Streptococcus pyogenes NOT DETECTED NOT DETECTED Final   A.calcoaceticus-baumannii NOT DETECTED NOT DETECTED Final   Bacteroides fragilis NOT DETECTED NOT DETECTED Final   Enterobacterales DETECTED (A) NOT DETECTED Final    Comment: Enterobacterales represent a large order of gram negative bacteria, not a single organism. Refer to culture for further identification. CRITICAL RESULT CALLED TO, READ BACK BY AND VERIFIED  WITH: PHARMD VERANDA BRYK BY MESSAN HY. AT 2330 ON 01/09/2021    Enterobacter cloacae complex NOT DETECTED NOT DETECTED Final   Escherichia coli NOT DETECTED NOT DETECTED Final   Klebsiella aerogenes NOT DETECTED NOT DETECTED Final   Klebsiella oxytoca NOT DETECTED NOT DETECTED Final   Klebsiella pneumoniae NOT DETECTED NOT DETECTED Final   Proteus species NOT DETECTED NOT DETECTED Final   Salmonella species NOT  DETECTED NOT DETECTED Final   Serratia marcescens NOT DETECTED NOT DETECTED Final   Haemophilus influenzae NOT DETECTED NOT DETECTED Final   Neisseria meningitidis NOT DETECTED NOT DETECTED Final   Pseudomonas aeruginosa NOT DETECTED NOT DETECTED Final   Stenotrophomonas maltophilia NOT DETECTED NOT DETECTED Final   Candida albicans NOT DETECTED NOT DETECTED Final   Candida auris NOT DETECTED NOT DETECTED Final   Candida glabrata NOT DETECTED NOT DETECTED Final   Candida krusei NOT DETECTED NOT DETECTED Final   Candida parapsilosis NOT DETECTED NOT DETECTED Final   Candida tropicalis NOT DETECTED NOT DETECTED Final   Cryptococcus neoformans/gattii NOT DETECTED NOT DETECTED Final   CTX-M ESBL NOT DETECTED NOT DETECTED Final   Carbapenem resistance IMP NOT DETECTED NOT DETECTED Final   Carbapenem resistance KPC NOT DETECTED NOT DETECTED Final   Carbapenem resistance NDM NOT DETECTED NOT DETECTED Final   Carbapenem resist OXA 48 LIKE NOT DETECTED NOT DETECTED Final   Carbapenem resistance VIM NOT DETECTED NOT DETECTED Final    Comment: Performed at [facility], [address] [place], [place] [address]  Resp Panel by RT-PCR (Flu A&B, Covid) Nasopharyngeal Swab     Status: None   Collection Time: 01/04/21  6:05 PM   Specimen: Nasopharyngeal Swab; Nasopharyngeal(NP) swabs in vial transport medium  Result Value Ref Range Status   SARS Coronavirus 2 by RT PCR NEGATIVE NEGATIVE Final    Comment: (NOTE) SARS-CoV-2 target nucleic acids are NOT DETECTED.  The SARS-CoV-2 RNA is generally detectable in upper respiratory specimens during the acute phase of infection. The lowest concentration of SARS-CoV-2 viral copies this assay can detect is 138 copies/mL. A negative result does not preclude SARS-Cov-2 infection and should not be used as the sole basis for treatment or other patient management decisions. A negative result may occur with  improper specimen collection/handling, submission  of specimen other than nasopharyngeal swab, presence of viral mutation(s) within the areas targeted by this assay, and inadequate number of viral copies(<138 copies/mL). A negative result must be combined with clinical observations, patient history, and epidemiological information. The expected result is Negative.  Fact Sheet for Patients:  EntrepreneurPulse.com.au  Fact Sheet for Healthcare Providers:  IncredibleEmployment.be  This test is no t yet approved or cleared by the [place] FDA and  has been authorized for detection and/or diagnosis of SARS-CoV-2 by FDA under an Emergency Use Authorization (EUA). This EUA will remain  in effect (meaning this test can be used) for the duration of the COVID-19 declaration under Section 564(b)(1) of the Act, 21 U.S.C.section 360bbb-3(b)(1), unless the authorization is terminated  or revoked sooner.       Influenza A by PCR NEGATIVE NEGATIVE Final   Influenza B by PCR NEGATIVE NEGATIVE Final    Comment: (NOTE) The Xpert Xpress SARS-CoV-2/FLU/RSV plus assay is intended as an aid in the diagnosis of influenza from Nasopharyngeal swab specimens and should not be used as a sole basis for treatment. Nasal washings and aspirates are unacceptable for Xpert Xpress SARS-CoV-2/FLU/RSV testing.  Fact Sheet for Patients: EntrepreneurPulse.com.au  Fact Sheet for Healthcare Providers: IncredibleEmployment.be  This test is not yet approved or cleared by the [place] FDA and has been authorized for detection and/or diagnosis of SARS-CoV-2 by FDA under an Emergency Use Authorization (EUA). This EUA will remain in effect (meaning this test can be used) for the duration of the COVID-19 declaration under Section 564(b)(1) of the Act, 21 U.S.C. section 360bbb-3(b)(1), unless the authorization is terminated or revoked.  Performed at [facility], [place]  [address], [place], [place] [address]   Surgical PCR screen     Status: None   Collection Time: 01/04/21 10:41 PM   Specimen: Nasal Mucosa; Nasal Swab  Result Value Ref Range Status   MRSA, PCR NEGATIVE NEGATIVE Final   Staphylococcus aureus NEGATIVE NEGATIVE Final    Comment: (NOTE) The Xpert SA Assay (FDA approved for NASAL specimens in patients 82 years of age and older), is one component of a comprehensive surveillance program. It is not intended to diagnose infection nor to guide or monitor treatment. Performed at [facility], [place] [address], [place], [place] [address]   Aerobic/Anaerobic Culture w Gram Stain (surgical/deep wound)     Status: None   Collection Time: 01/06/21  8:46 PM   Specimen: Abscess  Result Value Ref Range Status   Specimen Description   Final    ABSCESS RIGHT HEEL Performed at [facility] [address], [place], [place] [address]    Special Requests   Final    NONE Performed at [facility], [place] [address] [place], [place] [address]    Gram Stain NO WBC SEEN RARE GRAM POSITIVE COCCI   Final   Culture   Final    No growth aerobically or anaerobically. Performed at [facility], [place] [address], [place], [place] [address]    Report Status 01/12/2021 FINAL  Final  Culture, blood (routine x 2)     Status: None (Preliminary result)   Collection Time: 01/11/21  6:55 PM   Specimen: BLOOD LEFT HAND  Result Value Ref Range Status   Specimen Description BLOOD LEFT HAND  Final   Special Requests   Final    BOTTLES DRAWN AEROBIC ONLY Blood Culture adequate volume   Culture   Final    NO GROWTH 3 DAYS Performed at [facility], [place] [address], [place], [place] [address]    Report Status PENDING  Incomplete  Culture, blood (routine x 2)     Status: None (Preliminary result)   Collection Time: 01/11/21  6:55 PM   Specimen: BLOOD LEFT HAND  Result Value Ref Range Status   Specimen  Description BLOOD LEFT HAND  Final   Special Requests   Final    BOTTLES DRAWN AEROBIC ONLY Blood Culture adequate volume   Culture   Final    NO GROWTH 3 DAYS Performed at [facility], [place] [address], [place], [place] [address]    Report Status PENDING  Incomplete         Radiology Studies: No results found.      Scheduled Meds: . amLODipine  10 mg Oral Daily  . aspirin EC  81 mg Oral QHS  . atenolol  50 mg Oral Daily  . cloNIDine  0.1 mg Oral BID  . docusate sodium  100 mg Oral BID  . hydrALAZINE  50 mg Oral BID  . insulin aspart  0-15 Units Subcutaneous TID WC  . insulin aspart  0-5 Units Subcutaneous QHS  . insulin aspart  4 Units Subcutaneous TID WC  . insulin glargine  35 Units Subcutaneous QHS  . iron polysaccharides  150 mg Oral Daily  . omega-3 acid ethyl esters  1 g Oral Daily  . pregabalin  50 mg Oral TID  . tamsulosin  0.4 mg Oral QPC supper   Continuous Infusions: . sodium chloride Stopped (01/12/21 0053)  . cefTRIAXone (ROCEPHIN)  IV 2 g (01/13/21 2205)  . methocarbamol (ROBAXIN) IV       LOS: 10 days    Time spent: 32 minutes     Barb Merino, MD [facility] Pager [phone]

## 2021-01-14 NOTE — Plan of Care (Signed)
?  Problem: Education: ?Goal: Knowledge of General Education information will improve ?Description: Including pain rating scale, medication(s)/side effects and non-pharmacologic comfort measures ?Outcome: Progressing ?  ?Problem: Health Behavior/Discharge Planning: ?Goal: Ability to manage health-related needs will improve ?Outcome: Progressing ?  ?Problem: Nutrition: ?Goal: Adequate nutrition will be maintained ?Outcome: Progressing ?  ?Problem: Coping: ?Goal: Level of anxiety will decrease ?Outcome: Progressing ?  ?Problem: Skin Integrity: ?Goal: Risk for impaired skin integrity will decrease ?Outcome: Progressing ?  ?

## 2021-01-15 DIAGNOSIS — N179 Acute kidney failure, unspecified: Secondary | ICD-10-CM | POA: Diagnosis not present

## 2021-01-15 DIAGNOSIS — R945 Abnormal results of liver function studies: Secondary | ICD-10-CM | POA: Diagnosis not present

## 2021-01-15 DIAGNOSIS — M86171 Other acute osteomyelitis, right ankle and foot: Secondary | ICD-10-CM | POA: Diagnosis not present

## 2021-01-15 DIAGNOSIS — E1121 Type 2 diabetes mellitus with diabetic nephropathy: Secondary | ICD-10-CM | POA: Diagnosis not present

## 2021-01-15 LAB — COMPREHENSIVE METABOLIC PANEL
ALT: 141 U/L — ABNORMAL HIGH (ref 0–44)
AST: 160 U/L — ABNORMAL HIGH (ref 15–41)
Albumin: 1.5 g/dL — ABNORMAL LOW (ref 3.5–5.0)
Alkaline Phosphatase: 701 U/L — ABNORMAL HIGH (ref 38–126)
Anion gap: 11 (ref 5–15)
BUN: 70 mg/dL — ABNORMAL HIGH (ref 8–23)
CO2: 18 mmol/L — ABNORMAL LOW (ref 22–32)
Calcium: 8.1 mg/dL — ABNORMAL LOW (ref 8.9–10.3)
Chloride: 105 mmol/L (ref 98–111)
Creatinine, Ser: 5.86 mg/dL — ABNORMAL HIGH (ref 0.61–1.24)
GFR, Estimated: 10 mL/min — ABNORMAL LOW (ref >60)
Glucose, Bld: 108 mg/dL — ABNORMAL HIGH (ref 70–99)
Potassium: 4.7 mmol/L (ref 3.5–5.1)
Sodium: 134 mmol/L — ABNORMAL LOW (ref 135–145)
Total Bilirubin: 3.5 mg/dL — ABNORMAL HIGH (ref 0.3–1.2)
Total Protein: 5.8 g/dL — ABNORMAL LOW (ref 6.5–8.1)

## 2021-01-15 LAB — CBC WITH DIFFERENTIAL/PLATELET
Abs Immature Granulocytes: 0.25 10*3/uL — ABNORMAL HIGH (ref 0.00–0.07)
Basophils Absolute: 0 10*3/uL (ref 0.0–0.1)
Basophils Relative: 0 %
Eosinophils Absolute: 0.2 10*3/uL (ref 0.0–0.5)
Eosinophils Relative: 2 %
HCT: 23.1 % — ABNORMAL LOW (ref 39.0–52.0)
Hemoglobin: 8 g/dL — ABNORMAL LOW (ref 13.0–17.0)
Immature Granulocytes: 2 %
Lymphocytes Relative: 9 %
Lymphs Abs: 0.9 10*3/uL (ref 0.7–4.0)
MCH: 30.5 pg (ref 26.0–34.0)
MCHC: 34.6 g/dL (ref 30.0–36.0)
MCV: 88.2 fL (ref 80.0–100.0)
Monocytes Absolute: 1.4 10*3/uL — ABNORMAL HIGH (ref 0.1–1.0)
Monocytes Relative: 13 %
Neutro Abs: 7.5 10*3/uL (ref 1.7–7.7)
Neutrophils Relative %: 74 %
Platelets: 230 10*3/uL (ref 150–400)
RBC: 2.62 MIL/uL — ABNORMAL LOW (ref 4.22–5.81)
RDW: 16.4 % — ABNORMAL HIGH (ref 11.5–15.5)
WBC: 10.3 10*3/uL (ref 4.0–10.5)
nRBC: 0 % (ref 0.0–0.2)

## 2021-01-15 LAB — GLUCOSE, CAPILLARY
Glucose-Capillary: 78 mg/dL (ref 70–99)
Glucose-Capillary: 55 mg/dL — ABNORMAL LOW (ref 70–99)
Glucose-Capillary: 69 mg/dL — ABNORMAL LOW (ref 70–99)
Glucose-Capillary: 104 mg/dL — ABNORMAL HIGH (ref 70–99)
Glucose-Capillary: 209 mg/dL — ABNORMAL HIGH (ref 70–99)
Glucose-Capillary: 211 mg/dL — ABNORMAL HIGH (ref 70–99)

## 2021-01-15 MED ORDER — HYDROMORPHONE HCL 2 MG PO TABS
2.0000 mg | ORAL_TABLET | Freq: Once | ORAL | Status: AC
  Administered 2021-01-16: 2 mg via ORAL
  Filled 2021-01-15: qty 1

## 2021-01-15 MED ORDER — INSULIN GLARGINE 100 UNIT/ML ~~LOC~~ SOLN
28.0000 [IU] | Freq: Every day | SUBCUTANEOUS | Status: DC
  Administered 2021-01-15: 28 [IU] via SUBCUTANEOUS
  Filled 2021-01-15 (×2): qty 0.28

## 2021-01-15 MED ORDER — SEVELAMER CARBONATE 800 MG PO TABS
800.0000 mg | ORAL_TABLET | Freq: Three times a day (TID) | ORAL | Status: DC
  Administered 2021-01-15 – 2021-01-16 (×5): 800 mg via ORAL
  Filled 2021-01-15 (×5): qty 1

## 2021-01-15 MED ORDER — FUROSEMIDE 10 MG/ML IJ SOLN
60.0000 mg | Freq: Once | INTRAMUSCULAR | Status: AC
  Administered 2021-01-15: 60 mg via INTRAVENOUS
  Filled 2021-01-15: qty 6

## 2021-01-15 NOTE — Progress Notes (Signed)
[facility] Progress Note   Subjective:   Had 4.5 liter UOP over 2/24. Feels ok.  He's glad kidney numbers are pretty stable.  Wife here as well .  Review of systems:    Denies shortness of breath at rest but has with exertion - maybe a little better Scrotal swelling Denies n/v No cp   Objective Vitals:   01/14/21 1335 01/14/21 2002 01/15/21 0414 01/15/21 0801  BP: (!) 156/73 (!) 153/73 (!) 143/73 (!) 157/73  Pulse: 62 (!) 59 (!) 56 (!) 56  Resp: '19 18 16 17  '$ Temp: 98.5 F (36.9 C) 98.8 F (37.1 C) 98.6 F (37 C) 98.3 F (36.8 C)  TempSrc: Oral Oral Oral Oral  SpO2: 96% 97% 97% 95%  Weight:      Height:         Additional Objective Labs: Basic Metabolic Panel: Recent Labs  Lab 01/10/21 0306 01/11/21 0306 01/12/21 0335 01/13/21 0242 01/14/21 0747 01/15/21 0155  NA 133* 133*   < > 131* 134* 134*  K 4.3 4.2   < > 4.3 4.6 4.7  CL 106 107   < > 104 107 105  CO2 15* 16*   < > 16* 16* 18*  GLUCOSE 143* 154*   < > 173* 64* 108*  BUN 62* 63*   < > 67* 71* 70*  CREATININE 5.77* 5.96*   < > 5.78* 5.98* 5.86*  CALCIUM 7.9* 7.9*   < > 7.7* 7.9* 8.1*  PHOS 5.8* 6.1*  --  6.7*  --   --    < > = values in this interval not displayed.   CBC: Recent Labs  Lab 01/10/21 0306 01/10/21 1938 01/11/21 0306 01/13/21 0242 01/14/21 0747 01/15/21 0155  WBC 14.6*  --  11.8* 13.4* 12.3* 10.3  NEUTROABS 10.7*  --  8.1* 10.1* 8.9* 7.5  HGB 9.2*   < > 9.2* 8.2* 8.3* 8.0*  HCT 25.7*   < > 26.5* 23.8* 24.1* 23.1*  MCV 87.1  --  88.0 88.5 87.3 88.2  PLT 240  --  215 227 248 230   < > = values in this interval not displayed.   Blood Culture    Component Value Date/Time   SDES BLOOD LEFT HAND 01/11/2021 1855   SDES BLOOD LEFT HAND 01/11/2021 1855   SPECREQUEST  01/11/2021 1855    BOTTLES DRAWN AEROBIC ONLY Blood Culture adequate volume   SPECREQUEST  01/11/2021 1855    BOTTLES DRAWN AEROBIC ONLY Blood Culture adequate volume   CULT  01/11/2021 1855    NO  GROWTH 3 DAYS Performed at [facility], [place] [address] [place], [place] [address]    CULT  01/11/2021 1855    NO GROWTH 3 DAYS Performed at [facility] [address] [place], [place] [address]    REPTSTATUS PENDING 01/11/2021 1855   REPTSTATUS PENDING 01/11/2021 1855     Physical Exam  General adult male in bed in no acute distress HEENT normocephalic atraumatic extraocular movements intact sclera anicteric Neck supple trachea midline Lungs clear to auscultation bilaterally normal work of breathing at rest  Heart S1S2 no rub Abdomen soft nontender nondistended Extremities right BKA; left leg 2+ edema; no arm edema Psych normal mood and affect Neuro - alert and oriented x 3 provides hx and follows commands GU scrotal edema; has foley   Medications: . sodium chloride Stopped (01/12/21 0053)  . methocarbamol (ROBAXIN) IV     . amLODipine  10 mg Oral Daily  . aspirin EC  81 mg Oral QHS  . atenolol  50 mg Oral Daily  . cloNIDine  0.1 mg Oral BID  . docusate sodium  100 mg Oral BID  . hydrALAZINE  50 mg Oral BID  . insulin aspart  0-15 Units Subcutaneous TID WC  . insulin aspart  0-5 Units Subcutaneous QHS  . insulin aspart  4 Units Subcutaneous TID WC  . insulin glargine  35 Units Subcutaneous QHS  . iron polysaccharides  150 mg Oral Daily  . omega-3 acid ethyl esters  1 g Oral Daily  . pregabalin  50 mg Oral TID  . tamsulosin  0.4 mg Oral QPC supper     Assessment/Plan: 1. AKI: b/l creat from mid 2021 was 1.09.  Here creat 4.0 on admit 2/15, then peaked at 5.96 on 2/21.  Suspect ATN but urinary retention as below.  Renal ultrasound consistent with medical renal disease. Lisinopril, HCTZ dc'd - No indication for RRT at this time. Will follow  - changed to renal carb modified diet.   2. 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 Per orthopedics.   3. HTN: Lisinopril/HCTZ held. Repeat lasix today   4. Anemia-  Secondary to  chronic illness. Tsat low, holding IV Fe in the setting of active infection. AKI contributing as well.  aranesp 40 mcg once on 2/23.  Started oral iron  5. hyperphosphatemia - renal diet. Start renvela as binder for now  6. DM - Insulin per primary team.   7. Urinary retention: new per patient the last several days.  on flomax.  Now with foley and continue same.   Hoping for CIR soon per SW.   Claudia Desanctis, MD 01/15/2021  8:54 AM

## 2021-01-15 NOTE — PMR Pre-admission (Signed)
PMR Admission Coordinator Pre-Admission Assessment  Patient: Fernando Anthony is an 65 y.o., male MRN: [id number] DOB: 17-Aug-1956 Height: 6' 2.02" (188 cm) Weight: 101.9 kg  Insurance Information HMO:     PPO:      PCP:      IPA:      80/20:      OTHER:  PRIMARY: [employer] [place]      Policy#: [id number]      Subscriber: wife CM Name: Altha Harm      Phone#: [phone]     Fax#: [phone] Pre-Cert#: [id number] approved until 3/2      Employer: [employer] Benefits:  Phone #: [phone]     Name: 01/14/2021 Eff. Date: 2/24     Deduct: $2800      Out of Pocket Max: $2800 CIR: 100%      SNF: 100% 12 0 days per year Outpatient: 100%     Co-Pay: 60 visits combined Home Health: 100%      Co-Pay: 50 visits DME: 100%     Co-Pay: none Providers: in network  SECONDARY: none      Policy#:      Phone#:   Development worker, community:       Phone#:   The Engineer, petroleum" for patients in Inpatient Rehabilitation Facilities with attached "Privacy Act [place] Records" was provided and verbally reviewed with: N/A  Emergency Contact Information Contact Information    Name Relation Home Work Mobile   Douglas Spouse [phone]      Current Medical History  Patient Admitting Diagnosis: BKA  History of Present Illness:  65 year old right-handed male with history of diabetes mellitus, hypertension, hyperlipidemia, tobacco abuse, peripheral vascular disease with partial amputation first ray right foot 12/05/2018 as well as right great toe amputation at [facility] 09/18/2013.   Presented 01/04/2021 with right plantar ulceration as well as bouts of malaise and nausea with scrotal swelling.  He had initially been seeing podiatry for his right plantar ulceration.  MRI of right foot showed open wound involving plantar aspect of the heel with gas noted in the soft tissues.  Non enhancing soft tissue suggestive of tissue necrosis.  No discrete rim-enhancing drain able  soft tissue abscess.  Abnormal signal intensity and enhancement along the plantar cortex of the calcaneus consistent with early osteomyelitis.  Admission chemistry sodium 129 potassium 3.4 glucose 239 BUN 56 creatinine 3.94 from baseline 11/29/2019 of 1.01, WBC 25,300, hemoglobin 9.9, BNP 449, sedimentation rate 129, lactic acid 3.0, blood cultures positive enterobacter ales, hemoglobin A1c 10.5.  Infectious disease currently consulted for positive blood cultures placed on broad-spectrum antibiotics with follow-up orthopedic service Dr. 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  All current antibiotics have been discontinued with latest WBC of 10,300.  Nephrology consulted for AKI suspect ATN and renal ultrasound showed no hydronephrosis.  His lisinopril and HCTZ were discontinued with latest creatinine 5.86 and no current plan for hemodialysis at this time..  Patient noted significant scrotal edema some urinary retention placed on Flomax a Foley catheter tube had been placed.  Acute on chronic anemia remained stable latest hemoglobin 8.0.     Patient's medical record from [facility] has been reviewed by the rehabilitation admission coordinator and physician.  Past Medical History  Past Medical History:  Diagnosis Date  . Back pain   . Diabetes mellitus   . Headache(784.0)    general  . Hyperlipidemia   . Hypertension   .  Osteomyelitis ([place]) 11/2018   RIGHT FOOT    Family History   family history includes Cancer in his mother; Heart disease in his father.  Prior Rehab/Hospitalizations Has the patient had prior rehab or hospitalizations prior to admission? Yes  Has the patient had major surgery during 100 days prior to admission? Yes   Current Medications  Current Facility-Administered Medications:  .  0.9 %  sodium chloride infusion, , Intravenous, Continuous, Newt Minion, MD, Stopped at 01/12/21  [phone] .  acetaminophen (TYLENOL) tablet 325-650 mg, 325-650 mg, Oral, Q6H PRN, Newt Minion, MD, 650 mg at 01/11/21 2230 .  amLODipine (NORVASC) tablet 10 mg, 10 mg, Oral, Daily, Newt Minion, MD, 10 mg at 01/15/21 0820 .  aspirin EC tablet 81 mg, 81 mg, Oral, QHS, Newt Minion, MD, 81 mg at 01/14/21 2210 .  atenolol (TENORMIN) tablet 50 mg, 50 mg, Oral, Daily, Newt Minion, MD, 50 mg at 01/14/21 2210 .  bisacodyl (DULCOLAX) suppository 10 mg, 10 mg, Rectal, Daily PRN, Newt Minion, MD .  cloNIDine (CATAPRES) tablet 0.1 mg, 0.1 mg, Oral, BID, Newt Minion, MD, 0.1 mg at 01/15/21 0820 .  docusate sodium (COLACE) capsule 100 mg, 100 mg, Oral, BID, Newt Minion, MD, 100 mg at 01/13/21 8280 .  hydrALAZINE (APRESOLINE) tablet 50 mg, 50 mg, Oral, BID, Newt Minion, MD, 50 mg at 01/15/21 0820 .  HYDROmorphone (DILAUDID) injection 0.5-1 mg, 0.5-1 mg, Intravenous, Q4H PRN, Newt Minion, MD, 1 mg at 01/15/21 1457 .  insulin aspart (novoLOG) injection 0-15 Units, 0-15 Units, Subcutaneous, TID WC, Newt Minion, MD, 2 Units at 01/14/21 1709 .  insulin aspart (novoLOG) injection 0-5 Units, 0-5 Units, Subcutaneous, QHS, Newt Minion, MD, 2 Units at 01/11/21 2106 .  insulin aspart (novoLOG) injection 4 Units, 4 Units, Subcutaneous, TID WC, Newt Minion, MD, 4 Units at 01/14/21 1709 .  insulin glargine (LANTUS) injection 28 Units, 28 Units, Subcutaneous, QHS, Ghimire, Kuber, MD .  iron polysaccharides (NIFEREX) capsule 150 mg, 150 mg, Oral, Daily, Claudia Desanctis, MD, 150 mg at 01/15/21 0820 .  methocarbamol (ROBAXIN) tablet 500 mg, 500 mg, Oral, Q6H PRN, 500 mg at 01/13/21 1150 **OR** methocarbamol (ROBAXIN) 500 mg in dextrose 5 % 50 mL IVPB, 500 mg, Intravenous, Q6H PRN, Newt Minion, MD .  metoCLOPramide (REGLAN) tablet 5-10 mg, 5-10 mg, Oral, Q8H PRN **OR** metoCLOPramide (REGLAN) injection 5-10 mg, 5-10 mg, Intravenous, Q8H PRN, Newt Minion, MD .  omega-3 acid ethyl esters (LOVAZA)  capsule 1 g, 1 g, Oral, Daily, Newt Minion, MD, 1 g at 01/15/21 [phone] .  ondansetron (ZOFRAN) tablet 4 mg, 4 mg, Oral, Q6H PRN **OR** ondansetron (ZOFRAN) injection 4 mg, 4 mg, Intravenous, Q6H PRN, Newt Minion, MD .  oxyCODONE (Oxy IR/ROXICODONE) immediate release tablet 10-15 mg, 10-15 mg, Oral, Q4H PRN, Newt Minion, MD, 15 mg at 01/15/21 1612 .  oxyCODONE (Oxy IR/ROXICODONE) immediate release tablet 5-10 mg, 5-10 mg, Oral, Q4H PRN, Newt Minion, MD .  polyethylene glycol (MIRALAX / GLYCOLAX) packet 17 g, 17 g, Oral, Daily PRN, Barb Merino, MD .  pregabalin (LYRICA) capsule 50 mg, 50 mg, Oral, TID, Newt Minion, MD, 50 mg at 01/15/21 1546 .  promethazine (PHENERGAN) tablet 12.5 mg, 12.5 mg, Oral, Q4H PRN, Newt Minion, MD .  senna-docusate (Senokot-S) tablet 1 tablet, 1 tablet, Oral, QHS PRN, Newt Minion, MD, 1 tablet at 01/08/21 2126 .  sevelamer carbonate (RENVELA) tablet 800 mg, 800 mg, Oral, TID WC, Claudia Desanctis, MD, 800 mg at 01/15/21 1230 .  sodium chloride flush (NS) 0.9 % injection 3 mL, 3 mL, Intravenous, PRN, Newt Minion, MD .  tamsulosin [facility]) capsule 0.4 mg, 0.4 mg, Oral, QPC supper, Newt Minion, MD, 0.4 mg at 01/14/21 1709  Patients Current Diet:  Diet Order            Diet renal/carb modified with fluid restriction Diet-HS Snack? Nothing; Fluid restriction: 1500 mL Fluid; Room service appropriate? Yes; Fluid consistency: Thin  Diet effective now                 Precautions / Restrictions Precautions Precautions: Fall,Other (comment) Precaution Comments: RLE wound vac/ swollen scotum Other Brace: R residual limb guard and R shrinker- Pt still not wearing shrinker.  Says it's too painful with tubing from wound vac pressing on limb.  Encouraged patient to wear shrinker but continues not to. Restrictions Weight Bearing Restrictions: Yes RLE Weight Bearing: Non weight bearing   Has the patient had 2 or more falls or a fall with injury in the  past year? No  Prior Activity Level Limited Community (1-2x/wk): Mod I with RW  Prior Functional Level Self Care: Did the patient need help bathing, dressing, using the toilet or eating? Independent  Indoor Mobility: Did the patient need assistance with walking from room to room (with or without device)? Independent  Stairs: Did the patient need assistance with internal or external stairs (with or without device)? Independent  Functional Cognition: Did the patient need help planning regular tasks such as shopping or remembering to take medications? [employer] / Equipment Home Assistive Devices/Equipment: Engineer, drilling (specify type),Cane (specify quad or straight),Shower chair with back (reading glasses, scooter) Home Equipment: Walker - 2 wheels,Crutches,Bedside commode,Shower seat,Grab bars - tub/shower,Electric scooter,Other (comment)  Prior Device Use: Indicate devices/aids used by the patient prior to current illness, exacerbation or injury? Walker  Current Functional Level Cognition  Overall Cognitive Status: Within Functional Limits for tasks assessed Orientation Level: Oriented X4 General Comments: Very agreeable to session. Verablizing education back to therapist    Extremity Assessment (includes Sensation/Coordination)  Upper Extremity Assessment: Overall WFL for tasks assessed (Increased edema)  Lower Extremity Assessment: Defer to PT evaluation RLE Deficits / Details: S/p R transtibial amputation; noted swelling throughout (limb guard and shrinker donned); able to perform SLR LLE Deficits / Details: Functionally >3/5 throughout, pt reports LLE feeling weak with standing attempts; able to perform SLR while seated but unable to maintain hip flexion and poor eccentric control with SLR    ADLs  Overall ADL's : Needs assistance/impaired Eating/Feeding: Set up,Sitting Grooming: Set up,Sitting Upper Body Bathing: Set up,Sitting Lower Body  Bathing: Moderate assistance,Sitting/lateral leans Upper Body Dressing : Set up,Sitting Lower Body Dressing: Moderate assistance,Sitting/lateral leans Lower Body Dressing Details (indicate cue type and reason): partial stand Toilet Transfer: Squat-pivot,Transfer board,Moderate Technical brewer Details (indicate cue type and reason): squat pivot vs lateral scoots to drop arm chair Toileting- Clothing Manipulation and Hygiene: Sitting/lateral lean,Sit to/from stand,Moderate assistance Toileting - Clothing Manipulation Details (indicate cue type and reason): partial stand General ADL Comments: Fabricating scrotal sling for edema management. Education pt on use and wear.    Mobility  Overal bed mobility: Needs Assistance Bed Mobility: Rolling Rolling: Min guard General bed mobility comments: Cues for hand placement to improve sequencing.  Pt able to progress LEs to edge of bed.  Transfers  Overall transfer level: Needs assistance Equipment used: Rolling walker (2 wheeled) Transfers: Sit to/from Stand Sit to Stand: Mod assist,From elevated surface General transfer comment: Mod assistance to boost into standing.  Cues for hand/foot placement and forward weight shifting.    Ambulation / Gait / Stairs / Wheelchair Mobility  Ambulation/Gait Ambulation/Gait assistance: Herbalist (Feet): 4 Feet Assistive device: Rolling walker (2 wheeled) Gait Pattern/deviations: Step-to pattern,Trunk flexed General Gait Details: Pt performed three hop to steps from bed to recliner.  Pt required assistance to turn RW and back to seated surface. Gait velocity: Decreased    Posture / Balance Dynamic Sitting Balance Sitting balance - Comments: good static balance, fair dynamic balance. Balance Overall balance assessment: Needs assistance Sitting balance-Leahy Scale: Fair Sitting balance - Comments: good static balance, fair dynamic balance. Standing balance-Leahy  Scale: Poor Standing balance comment: Heavy reliance on BUE support    Special needs/care consideration Wound VAC to surgical site    Previous Home Environment  Living Arrangements: Spouse/significant other  Lives With: Spouse Available Help at Discharge:  (wife initially 24/7 then she will return to work) Type of Home: House Home Layout: One level Home Access: Ramped entrance Bathroom Shower/Tub: Chiropodist: Standard Bathroom Accessibility: Yes How Accessible: Accessible via walker Home Care Services: No Additional Comments: Wife works full-time, but has taken time off and can work from home temporarily  Discharge Living Setting Plans for Discharge Living Setting: Patient's home,Lives with (comment) (wife) Type of Home at Discharge: House Discharge Home Layout: One level Discharge Home Access: [place] entrance Discharge Bathroom Shower/Tub: [place] unit Discharge Bathroom Toilet: Standard Discharge Bathroom Accessibility: Yes How Accessible: Accessible via walker Does the patient have any problems obtaining your medications?: No  Social/Family/Support Systems Contact Information: wife, [place] Anticipated Caregiver: wife Anticipated Ambulance person Information: see above Ability/Limitations of Caregiver: wife temporarily off work Building control surveyor Availability: 24/7 Discharge Plan Discussed with Primary Caregiver: Yes Is Caregiver In Agreement with Plan?: Yes Does Caregiver/Family have Issues with Lodging/Transportation while Pt is in Rehab?: No  Goals Patient/Family Goal for Rehab: Mod I to supervision with PT and OT Expected length of stay: ELOS 7 to 10 days Pt/Family Agrees to Admission and willing to participate: Yes Program Orientation Provided & Reviewed with Pt/Caregiver Including Roles  & Responsibilities: Yes  Decrease burden of Care through IP rehab admission: n/a  Possible need for SNF placement upon discharge: not anticipated  Patient  Condition: I have reviewed medical records from [facility], spoken with  patient and spouse. I met with patient at the bedside for inpatient rehabilitation assessment.  Patient will benefit from ongoing PT and OT, can actively participate in 3 hours of therapy a day 5 days of the week, and can make measurable gains during the admission.  Patient will also benefit from the coordinated team approach during an Inpatient Acute Rehabilitation admission.  The patient will receive intensive therapy as well as Rehabilitation physician, nursing, social worker, and care management interventions.  Due to bladder management, bowel management, safety, skin/wound care, disease management, medication administration, pain management and patient education the patient requires 24 hour a day rehabilitation nursing.  The patient is currently mod assist overall with mobility and basic ADLs.  Discharge setting and therapy post discharge at home with home health is anticipated.  Patient has agreed to participate in the Acute Inpatient Rehabilitation Program and will admit Saturday 01/16/2021 when bed is available.  Preadmission Screen Completed By:  Cleatrice Burke, 01/15/2021  4:14 PM ______________________________________________________________________   Discussed status with Dr. Dagoberto Ligas on  01/15/2021 at  1615 and received approval for admission Saturday 01/16/2021 when bed is available.  Admission Coordinator:  Cleatrice Burke, RN, time  6483 Date 01/15/2021   Assessment/Plan: Diagnosis: 1. Does the need for close, 24 hr/day Medical supervision in concert with the patient's rehab needs make it unreasonable for this patient to be served in a less intensive setting? Yes 2. Co-Morbidities requiring supervision/potential complications: HTN, DM with A1c of 10.5, R osteomyelitis s/p BKA with VAC; urinary retention 3. Due to bladder management, bowel management, safety, skin/wound care, disease management,  medication administration, pain management and patient education, does the patient require 24 hr/day rehab nursing? Yes 4. Does the patient require coordinated care of a physician, rehab nurse, PT, OT, and SLP to address physical and functional deficits in the context of the above medical diagnosis(es)? Yes Addressing deficits in the following areas: balance, endurance, locomotion, strength, transferring, bowel/bladder control, bathing, dressing, feeding, grooming and toileting 5. Can the patient actively participate in an intensive therapy program of at least 3 hrs of therapy 5 days a week? Yes 6. The potential for patient to make measurable gains while on inpatient rehab is good 7. Anticipated functional outcomes upon discharge from inpatient rehab: modified independent and supervision PT, modified independent and supervision OT, n/a SLP 8. Estimated rehab length of stay to reach the above functional goals is: 7-10 days 9. Anticipated discharge destination: Home 10. Overall Rehab/Functional Prognosis: good   MD Signature:

## 2021-01-15 NOTE — Progress Notes (Signed)
Inpatient Rehabilitation Admissions Coordinator  CIR bed is available to admit patient to on Saturday. I have received medical clearance from Dr. Sloan Leiter as well as Dr. Royce Macadamia. He will be moving to our [facility] rehab unit after lunch Saturday. At 12 noon, 5N nurse can contact our rehab charge at [phone] to give report and identify room at [facility] he will be admitted to. I will make the necessary arrangements to admit Saturday. Dr Dagoberto Ligas , Rehab MD, will give final approval in the morning to admit.  Danne Baxter, RN, MSN Rehab Admissions Coordinator [phone] 01/15/2021 2:41 PM

## 2021-01-15 NOTE — Progress Notes (Signed)
Hypoglycemic Event  CBG: 55, 69  Treatment: 4oz of juice provided  Symptoms: Pt aysmptomatic  Follow-up CBG: Time:1249 CBG Result:104  Possible Reasons for Event: Not enough food intake  Comments/MD notified attending MD notified with no new orders at this time.    Mellody Dance, [place]

## 2021-01-15 NOTE — Progress Notes (Signed)
Occupational Therapy Treatment Patient Details Name: Fernando Anthony MRN: [id number] DOB: 09/09/1956 Today's Date: 01/15/2021    History of present illness Pt is a 65 y.o. male admitted 01/04/21 with R ankle osteomyelitis, AKI. S/p R foot I&D, antibiotic bead placement and bone biopsy 2/16. 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   OT comments  Upon arrival, pt sitting in recliner. Pt reports he used scrotal sling this AM and during transfer to recliner with PTA. Declined to use sling during transfer back to bed. Pt requiring Mod A and RW for stand pivot to bed. Pt requiring Min A for return to bed. Pt planning of taking a bath with wife assistance and plans to don sling after bath. Continue to recommend dc to CIR and will continue to follow acutely as admitted.    Follow Up Recommendations  CIR    Equipment Recommendations  3 in 1 bedside commode;Tub/shower bench;Other (comment) (drop arm 3n1)    Recommendations for Other Services      Precautions / Restrictions Precautions Precautions: Fall;Other (comment) Precaution Comments: RLE wound vac/ swollen scotum Required Braces or Orthoses: Other Brace Other Brace: R residual limb guard and R shrinker- Pt still not wearing shrinker.  Says it's too painful with tubing from wound vac pressing on limb.  Encouraged patient to wear shrinker but continues not to. Restrictions Weight Bearing Restrictions: Yes RLE Weight Bearing: Non weight bearing       Mobility Bed Mobility Overal bed mobility: Needs Assistance Bed Mobility: Sit to Supine Rolling: Min guard     Sit to supine: Min assist   General bed mobility comments: Min A for managing BLEs    Transfers Overall transfer level: Needs assistance Equipment used: Rolling walker (2 wheeled) Transfers: Sit to/from Stand Sit to Stand: From elevated  surface;Min assist         General transfer comment: Mod assistance to boost into standing.  Cues for hand/foot placement and forward weight shifting.    Balance Overall balance assessment: Needs assistance   Sitting balance-Leahy Scale: Fair Sitting balance - Comments: good static balance, fair dynamic balance.     Standing balance-Leahy Scale: Poor Standing balance comment: Heavy reliance on BUE support                           ADL either performed or assessed with clinical judgement   ADL Overall ADL's : Needs assistance/impaired                         Toilet Transfer: Minimal Insurance risk surveyor;Moderate assistance (simulated to/from recliner and EOB) Toilet Transfer Details (indicate cue type and reason): Min-Mod A for gaining balance in standing and then Min A for maintaining balance during pivot         Functional mobility during ADLs: Minimal assistance;Rolling walker;Cueing for sequencing;Cueing for safety;Moderate assistance General ADL Comments: Pt performing stand pivot to/from recliner and EOB. Discussed sling wear and pt reporting he wore it this morning. No discomfort with sling. Plans to wear again after bath (wife present).     Vision       Perception     Praxis      Cognition Arousal/Alertness: Awake/alert Behavior During Therapy: WFL for tasks assessed/performed Overall Cognitive Status: Within Functional Limits for tasks assessed  General Comments: Very agreeable to session. Verablizing education back to therapist        Exercises General Exercises - Lower Extremity Ankle Circles/Pumps: AROM;Left;20 reps;Supine Quad Sets: AROM;Left;10 reps;Supine Heel Slides: AROM;Left;10 reps;Supine Hip ABduction/ADduction: AROM;Left;10 reps;Supine Straight Leg Raises: Left;AAROM;10 reps;Supine Amputee Exercises Quad Sets: AROM;Right;10 reps;Supine Hip  ABduction/ADduction: AROM;Right;10 reps;Supine Knee Flexion: AROM;Right;10 reps;Supine Knee Extension: AROM;Right;10 reps;Supine   Shoulder Instructions       General Comments Wife present during session    Pertinent Vitals/ Pain       Pain Assessment: Faces Faces Pain Scale: Hurts little more Pain Location: R residual limb/ groin Pain Descriptors / Indicators: Throbbing Pain Intervention(s): Monitored during session;Limited activity within patient's tolerance;Repositioned  Home Living     Available Help at Discharge:  (wife initially 24/7 then she will return to work)                     Architectural technologist: Yes How Accessible: Accessible via walker        Lives With: Spouse    Prior Functioning/Environment              Frequency  Min 2X/week        Progress Toward Goals  OT Goals(current goals can now be found in the care plan section)  Progress towards OT goals: Progressing toward goals  Acute Rehab OT Goals Patient Stated Goal: more agreeable to CIR this session. OT Goal Formulation: With patient/family Time For Goal Achievement: 01/27/21 Potential to Achieve Goals: Good ADL Goals Pt Will Perform Grooming: with set-up;sitting Pt Will Perform Upper Body Dressing: with set-up;sitting Pt Will Perform Lower Body Dressing: with supervision;sit to/from stand;with adaptive equipment;sitting/lateral leans;with min guard assist Pt Will Transfer to Toilet: with min guard assist;squat pivot transfer;with transfer board;bedside commode Pt Will Perform Toileting - Clothing Manipulation and hygiene: sitting/lateral leans;with set-up  Plan Discharge plan remains appropriate    Co-evaluation                 AM-PAC OT "6 Clicks" Daily Activity     Outcome Measure   Help from another person eating meals?: None Help from another person taking care of personal grooming?: None Help from another person toileting, which includes using toliet,  bedpan, or urinal?: A Lot Help from another person bathing (including washing, rinsing, drying)?: A Lot Help from another person to put on and taking off regular upper body clothing?: None Help from another person to put on and taking off regular lower body clothing?: A Lot 6 Click Score: 18    End of Session Equipment Utilized During Treatment: Rolling walker;Gait belt (scrotal sling)  OT Visit Diagnosis: Unsteadiness on feet (R26.81);Muscle weakness (generalized) (M62.81);Pain   Activity Tolerance Patient tolerated treatment well   Patient Left with call bell/phone within reach;in bed;with family/visitor present   Nurse Communication Mobility status        Time: 1517-1530 OT Time Calculation (min): 13 min  Charges: OT Treatments $Self Care/Home Management : 8-22 mins  [address], OTR/L Acute Rehab Pager: [phone] Office: [place] 01/15/2021, 4:23 PM

## 2021-01-15 NOTE — Plan of Care (Signed)
  Problem: Education: Goal: Knowledge of General Education information will improve Description: Including pain rating scale, medication(s)/side effects and non-pharmacologic comfort measures Outcome: Progressing   Problem: Health Behavior/Discharge Planning: Goal: Ability to manage health-related needs will improve Outcome: Progressing   Problem: Clinical Measurements: Goal: Will remain free from infection Outcome: Progressing   Problem: Activity: Goal: Risk for activity intolerance will decrease Outcome: Progressing   Problem: Nutrition: Goal: Adequate nutrition will be maintained Outcome: Progressing   Problem: Pain Managment: Goal: General experience of comfort will improve Outcome: Progressing   

## 2021-01-15 NOTE — Progress Notes (Signed)
PROGRESS NOTE    Fernando Anthony  [id number] DOB: [birth year] DOA: 01/04/2021 PCP: Curly Rim, MD    Brief Narrative:  65 year old gentleman with insulin-dependent diabetes, diabetic foot ulcer right with multiple procedures in the past, hypertension hyperlipidemia who was following at podiatry office for long time and had multiple debridements presented with calcaneal osteomyelitis.  Admitted to the hospital for surgical debridement.  Persistent infection so needing amputation to clean margin and transferred to [facility].  Patient also developed acute kidney injury and nephrology is following.   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 Start working with PT OT.  Will refer to inpatient rehab.  Daptomycin 2/14-2/18 Rocephin 2/14--2/23.   Patient will be given adequate pain medications including IV and oral opiates, will optimize on oral regimen before discharge. Patient is already on Lyrica from home that is continued.  Acute kidney injury with azotemia: Baseline creatinine 1.2.  Presented with significant worsening renal functions. Urine output is adequate.  Ultrasound of the kidneys consistent with medical renal disease, no evidence of hydronephrosis. Avoiding all nephrotoxins.   Followed by nephrology.  Patient had urinary retention after surgery.  Had multiple straight cath done. Foley catheter on 2/23, now on Lasix  trial.  He does have anasarca and scrotal edema. Urinated more than 4 L last 24 hours.  Uncontrolled type 2 diabetes with hyperglycemia and diabetic foot infection: Currently blood sugars are well controlled on Lantus 35 units, prandial insulin 4 units and sliding scale insulin.  A1c is more than 10.  We will continue to titrate insulin regimen.  Currently stable.  Essential hypertension: Stable on amlodipine, atenolol, clonidine and hydralazine.  Discontinued hydrochlorothiazide and lisinopril due to AKI.  Abnormal LFTs: Bilirubin and transaminases remain about is stable.  INR normal.   May have suffered mild shock liver secondary to acute infection and hypovolemia.   Right upper quadrant ultrasound showed some fluid around the gallbladder but no evidence of a stone.  CBD was 3 mm.   Patient never had any abdominal pain.  Unlikely acalculus cholecystitis.  We will continue to monitor to ensure stabilization.   Patient will continue to work with PT OT. He will try to use less IV pain medications in order to transition to inpatient rehab. Will see if he can go to CIR with nephrology following.   DVT prophylaxis: SCDs Start: 01/11/21 1823 Place TED hose Start: 01/10/21 1121 SCDs Start: 01/06/21 2227 SCDs Start: 01/04/21 1945   Code Status: Full code Family Communication: Wife at the bedside  Disposition Plan: Status is: Inpatient  Remains inpatient appropriate because:IV treatments appropriate due to intensity of illness or inability to take PO and Inpatient level of care appropriate due to severity of illness   Dispo: The patient is from: Home              Anticipated d/c is to: CIR               Anticipated d/c date is: Architectural technologist.  Patient currently is not medically stable to d/c.  His pain is not adequately controlled.   Difficult to place patient No         Consultants:   Orthopedics  Nephrology  Podiatry  Procedures:   Right calcaneal debridement  2/16  Right BKA 2/21.  Antimicrobials:   Daptomycin 2/15-2/18  Rocephin 2/14----2/23.   Subjective: Patient seen and examined. No overnight events. Pain control is still issue but better than before. Still intermittently using Dilaudid but mostly managing on oxycodone. Urine output is more than 4 L, scrotal swelling persist but better. Denies any nausea vomiting or chest pain.  Objective: Vitals:   01/14/21 1335 01/14/21 2002 01/15/21 0414 01/15/21 0801  BP: (!) 156/73 (!) 153/73 (!) 143/73 (!) 157/73  Pulse: 62 (!) 59 (!) 56 (!) 56  Resp: '19 18 16 17  '$ Temp: 98.5 F (36.9 C) 98.8 F (37.1 C) 98.6 F (37 C) 98.3 F (36.8 C)  TempSrc: Oral Oral Oral Oral  SpO2: 96% 97% 97% 95%  Weight:      Height:        Intake/Output Summary (Last 24 hours) at 01/15/2021 1325 Last data filed at 01/15/2021 0738 Gross per 24 hour  Intake -  Output 4450 ml  Net -4450 ml   Filed Weights   01/04/21 2244 01/06/21 1813 01/11/21 1509  Weight: 101.9 kg 101.9 kg 101.9 kg    Examination:  General exam: Comfortable.  In moderate distress with pain.   Respiratory system: Clear to auscultation. Respiratory effort normal.  No added sounds. Cardiovascular system: S1 & S2 heard, RRR. No JVD Gastrointestinal system: Abdomen is nondistended, soft and nontender. No organomegaly or masses felt. Normal bowel sounds heard. Central nervous system: Alert and oriented. No focal neurological deficits. Extremities: Symmetric 5 x 5 power. Skin:  Right leg BKA stump fitted with wound vac. Bilateral 1+ edema.  Wound VAC canister with no fluid. Patient has huge edematous scrotum.  Nontender.  No evidence of underlying erythema or abscess. Less tense than yesterday.   Data Reviewed: I have personally reviewed following labs and imaging studies  CBC: Recent Labs  Lab 01/10/21 0306 01/10/21 1938 01/11/21 0306 01/13/21 0242 01/14/21 0747 01/15/21 0155  WBC 14.6*  --  11.8* 13.4* 12.3* 10.3   NEUTROABS 10.7*  --  8.1* 10.1* 8.9* 7.5  HGB 9.2* 9.7* 9.2* 8.2* 8.3* 8.0*  HCT 25.7* 28.3* 26.5* 23.8* 24.1* 23.1*  MCV 87.1  --  88.0 88.5 87.3 88.2  PLT 240  --  215 227 248 [id number]   Basic Metabolic Panel: Recent Labs  Lab 01/09/21 0416 01/10/21 0306 01/11/21 0306 01/12/21 0335 01/13/21 0242 01/14/21 0747 01/15/21 0155  NA 135 133* 133* 130* 131* 134* 134*  K 3.7 4.3 4.2 4.6 4.3 4.6 4.7  CL 107 106 107 103 104 107 105  CO2 18* 15* 16* 13* 16* 16* 18*  GLUCOSE 175* 143* 154* 354* 173* 64* 108*  BUN 67* 62* 63* 64* 67* 71* 70*  CREATININE 5.70* 5.77* 5.96* 5.86* 5.78* 5.98* 5.86*  CALCIUM 7.6* 7.9* 7.9* 7.9* 7.7* 7.9* 8.1*  MG 2.0 1.7 1.6*  --  1.8  --   --   PHOS 5.6* 5.8* 6.1*  --  6.7*  --   --    GFR: Estimated Creatinine Clearance: 16.4 mL/min (A) (by C-G formula based on SCr of 5.86 mg/dL (H)). Liver Function Tests: Recent Labs  Lab 01/11/21 0306 01/12/21 0335 01/13/21 0242 01/14/21 0747 01/15/21 0155  AST 90*  98* 135* 147* 160*  ALT 118* 120* 114* 133* 141*  ALKPHOS 600* 716* 668* 688* 701*  BILITOT 4.4* 3.8* 2.8* 3.6* 3.5*  PROT 5.5* 5.8* 5.4* 5.6* 5.8*  ALBUMIN 1.5* 1.5* 1.5* 1.5* 1.5*   No results for input(s): LIPASE, AMYLASE in the last 168 hours. No results for input(s): AMMONIA in the last 168 hours. Coagulation Profile: Recent Labs  Lab 01/13/21 0242  INR 1.1   Cardiac Enzymes: No results for input(s): CKTOTAL, CKMB, CKMBINDEX, TROPONINI in the last 168 hours. BNP (last 3 results) No results for input(s): PROBNP in the last 8760 hours. HbA1C: No results for input(s): HGBA1C in the last 72 hours. CBG: Recent Labs  Lab 01/14/21 2005 01/15/21 0643 01/15/21 1135 01/15/21 1217 01/15/21 1249  GLUCAP 113* 78 55* 69* 104*   Lipid Profile: No results for input(s): CHOL, HDL, LDLCALC, TRIG, CHOLHDL, LDLDIRECT in the last 72 hours. Thyroid Function Tests: No results for input(s): TSH, T4TOTAL, FREET4, T3FREE, THYROIDAB in the last 72  hours. Anemia Panel: No results for input(s): VITAMINB12, FOLATE, FERRITIN, TIBC, IRON, RETICCTPCT in the last 72 hours. Sepsis Labs: No results for input(s): PROCALCITON, LATICACIDVEN in the last 168 hours.  Recent Results (from the past 240 hour(s))  Aerobic/Anaerobic Culture w Gram Stain (surgical/deep wound)     Status: None   Collection Time: 01/06/21  8:46 PM   Specimen: Abscess  Result Value Ref Range Status   Specimen Description   Final    ABSCESS RIGHT HEEL Performed at [facility] [address] [place], [place] [address]    Special Requests   Final    NONE Performed at [facility], [place] [address], [place], [place] [address]    Gram Stain NO WBC SEEN RARE GRAM POSITIVE COCCI   Final   Culture   Final    No growth aerobically or anaerobically. Performed at [facility], [place] [address] [place], [place] [address]    Report Status 01/12/2021 FINAL  Final  Culture, blood (routine x 2)     Status: None (Preliminary result)   Collection Time: 01/11/21  6:55 PM   Specimen: BLOOD LEFT HAND  Result Value Ref Range Status   Specimen Description BLOOD LEFT HAND  Final   Special Requests   Final    BOTTLES DRAWN AEROBIC ONLY Blood Culture adequate volume   Culture   Final    NO GROWTH 4 DAYS Performed at [facility], [place] [address], [place], [place] [address]    Report Status PENDING  Incomplete  Culture, blood (routine x 2)     Status: None (Preliminary result)   Collection Time: 01/11/21  6:55 PM   Specimen: BLOOD LEFT HAND  Result Value Ref Range Status   Specimen Description BLOOD LEFT HAND  Final   Special Requests   Final    BOTTLES DRAWN AEROBIC ONLY Blood Culture adequate volume   Culture   Final    NO GROWTH 4 DAYS Performed at [facility], [place] [address], [place], [place] [address]    Report Status PENDING  Incomplete         Radiology Studies: No results found.      Scheduled  Meds: . amLODipine  10 mg Oral Daily  . aspirin EC  81 mg Oral QHS  . atenolol  50 mg Oral Daily  . cloNIDine  0.1 mg Oral BID  . docusate sodium  100 mg Oral BID  .  hydrALAZINE  50 mg Oral BID  . insulin aspart  0-15 Units Subcutaneous TID WC  . insulin aspart  0-5 Units Subcutaneous QHS  . insulin aspart  4 Units Subcutaneous TID WC  . insulin glargine  35 Units Subcutaneous QHS  . iron polysaccharides  150 mg Oral Daily  . omega-3 acid ethyl esters  1 g Oral Daily  . pregabalin  50 mg Oral TID  . sevelamer carbonate  800 mg Oral TID WC  . tamsulosin  0.4 mg Oral QPC supper   Continuous Infusions: . sodium chloride Stopped (01/12/21 0053)  . methocarbamol (ROBAXIN) IV       LOS: 11 days    Time spent: 32 minutes     Barb Merino, MD [facility] Pager [phone]

## 2021-01-15 NOTE — Progress Notes (Signed)
Physical Therapy Treatment Patient Details Name: Fernando Anthony MRN: [id number] DOB: 1956-01-07 Today's Date: 01/15/2021    History of Present Illness Pt is a 65 y.o. male admitted 01/04/21 with R ankle osteomyelitis, AKI. S/p R foot I&D, antibiotic bead placement and bone biopsy 2/16. 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    PT Comments    Pt supine in bed on arrival.  He reports he had a difficult time progressing OOB to commode with nursing staff.  Pt performed all mobility this session with min to mod assistance.  Continue to recommend aggressive rehab in a post acute setting to maximize functional gains before returning home.  Plan for progresion of gt next session if pain allows.      Follow Up Recommendations  CIR;Supervision for mobility/OOB     Equipment Recommendations   (TBD)    Recommendations for Other Services       Precautions / Restrictions Precautions Precautions: Fall;Other (comment) Precaution Comments: RLE wound vac/ swollen scotum Required Braces or Orthoses: Other Brace Other Brace: R residual limb guard and R shrinker- Pt still not wearing shrinker.  Says it's too painful with tubing from wound vac pressing on limb.  Encouraged patient to wear shrinker but continues not to. Restrictions Weight Bearing Restrictions: Yes RLE Weight Bearing: Non weight bearing    Mobility  Bed Mobility Overal bed mobility: Needs Assistance Bed Mobility: Rolling Rolling: Min guard         General bed mobility comments: Cues for hand placement to improve sequencing.  Pt able to progress LEs to edge of bed.    Transfers Overall transfer level: Needs assistance Equipment used: Rolling walker (2 wheeled) Transfers: Sit to/from Stand Sit to Stand: Mod assist;From elevated surface         General transfer comment: Mod assistance to  boost into standing.  Cues for hand/foot placement and forward weight shifting.  Ambulation/Gait Ambulation/Gait assistance: Min assist Gait Distance (Feet): 4 Feet Assistive device: Rolling walker (2 wheeled) Gait Pattern/deviations: Step-to pattern;Trunk flexed Gait velocity: Decreased   General Gait Details: Pt performed three hop to steps from bed to recliner.  Pt required assistance to turn RW and back to seated surface.   Stairs             Wheelchair Mobility    Modified Rankin (Stroke Patients Only)       Balance Overall balance assessment: Needs assistance   Sitting balance-Leahy Scale: Fair Sitting balance - Comments: good static balance, fair dynamic balance.     Standing balance-Leahy Scale: Poor Standing balance comment: Heavy reliance on BUE support                            Cognition Arousal/Alertness: Awake/alert Behavior During Therapy: WFL for tasks assessed/performed Overall Cognitive Status: Within Functional Limits for tasks assessed                                 General Comments: Very agreeable to session. Verablizing education back to therapist      Exercises General Exercises - Lower Extremity Ankle Circles/Pumps: AROM;Left;20 reps;Supine Quad Sets: AROM;Left;10 reps;Supine Heel Slides: AROM;Left;10 reps;Supine Hip ABduction/ADduction: AROM;Left;10 reps;Supine Straight Leg Raises: Left;AAROM;10 reps;Supine Amputee Exercises Quad Sets: AROM;Right;10 reps;Supine Hip ABduction/ADduction: AROM;Right;10 reps;Supine Knee Flexion: AROM;Right;10 reps;Supine Knee Extension: AROM;Right;10  reps;Supine    General Comments        Pertinent Vitals/Pain Pain Assessment: Faces Faces Pain Scale: Hurts even more Pain Location: R residual limb/ groin Pain Descriptors / Indicators: Throbbing Pain Intervention(s): Monitored during session;Repositioned    Home Living                      Prior Function             PT Goals (current goals can now be found in the care plan section) Acute Rehab PT Goals Patient Stated Goal: more agreeable to CIR this session. Potential to Achieve Goals: Good Progress towards PT goals: Progressing toward goals    Frequency    Min 5X/week      PT Plan Current plan remains appropriate    Co-evaluation              AM-PAC PT "6 Clicks" Mobility   Outcome Measure  Help needed turning from your back to your side while in a flat bed without using bedrails?: A Little Help needed moving from lying on your back to sitting on the side of a flat bed without using bedrails?: A Little Help needed moving to and from a bed to a chair (including a wheelchair)?: A Lot Help needed standing up from a chair using your arms (e.g., wheelchair or bedside chair)?: A Lot Help needed to walk in hospital room?: A Little   6 Click Score: 13    End of Session Equipment Utilized During Treatment: Gait belt Activity Tolerance: Patient limited by pain Patient left: with call bell/phone within reach;in chair Nurse Communication: Mobility status PT Visit Diagnosis: Other abnormalities of gait and mobility (R26.89);Muscle weakness (generalized) (M62.81)     Time: [id number] PT Time Calculation (min) (ACUTE ONLY): 27 min  Charges:  $Therapeutic Exercise: 8-22 mins $Therapeutic Activity: 8-22 mins                     Erasmo Leventhal , PTA Acute Rehabilitation Services Pager [phone] Office [phone]     Isiac Breighner Eli Hose 01/15/2021, 12:49 PM

## 2021-01-15 NOTE — H&P (Incomplete)
Physical Medicine and Rehabilitation Admission H&P    Chief Complaint  Patient presents with  . Foot Ulcer  : HPI: Fernando Anthony is a 65 year old right-handed male with history of diabetes mellitus, hypertension, hyperlipidemia, tobacco abuse, peripheral vascular disease with partial amputation first ray right foot 12/05/2018 as well as right great toe amputation at [facility] 09/18/2013.  Per chart review patient lives with spouse.  1 level home with ramped entrance.  Wife works full-time and is currently taking time off and can work from home temporarily.  Patient independent with assistive device prior to admission modified independent ambulating with rolling walker uses a scooter when out in the community.  Presented 01/04/2021 with right plantar ulceration as well as bouts of malaise and nausea with scrotal swelling.  He had initially been seeing podiatry for his right plantar ulceration.  MRI of right foot showed open wound involving plantar aspect of the heel with gas noted in the soft tissues.  Nonenhancing soft tissue suggestive of tissue necrosis.  No discrete rim-enhancing drainable soft tissue abscess.  Abnormal signal intensity and enhancement along the plantar cortex of the calcaneus consistent with early osteomyelitis.  Admission chemistry sodium 129 potassium 3.4 glucose 239 BUN 56 creatinine 3.94 from baseline 11/29/2019 of 1.01, WBC 25,300, hemoglobin 9.9, BNP 449, sedimentation rate 129, lactic acid 3.0, blood cultures positive enterobacterales, hemoglobin A1c 10.5.  Infectious disease currently consulted for positive blood cultures placed on broad-spectrum antibiotics with follow-up orthopedic service Dr. 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  All current antibiotics have been discontinued with latest WBC of 10,300.  Nephrology consulted for AKI suspect ATN and renal ultrasound showed no hydronephrosis.  His  lisinopril and HCTZ were discontinued with latest creatinine 5.86 and no current plan for hemodialysis at this time..  Patient noted significant scrotal edema some urinary retention placed on Flomax a Foley catheter tube had been placed.  Acute on chronic anemia remained stable latest hemoglobin 8.0.  Therapy evaluations completed due to patient decreased functional mobility was admitted for a comprehensive rehab program.  Review of Systems  Constitutional: Positive for malaise/fatigue. Negative for chills and fever.  HENT: Negative for hearing loss.   Eyes: Negative for blurred vision and double vision.  Respiratory: Negative for cough and shortness of breath.   Cardiovascular: Positive for leg swelling. Negative for chest pain.  Gastrointestinal: Positive for constipation and nausea. Negative for vomiting.  Genitourinary: Negative for dysuria, flank pain and hematuria.       Scrotal edema  Musculoskeletal: Positive for joint pain and myalgias.  Skin: Negative for rash.  Neurological: Positive for weakness and headaches.  All other systems reviewed and are negative.  Past Medical History:  Diagnosis Date  . Back pain   . Diabetes mellitus   . Headache(784.0)    general  . Hyperlipidemia   . Hypertension   . Osteomyelitis ([place]) 11/2018   RIGHT FOOT   Past Surgical History:  Procedure Laterality Date  . AMPUTATION Right 09/18/2013   Procedure: Amputation Right Great Toe at [facility];  Surgeon: Newt Minion, MD;  Location: [facility];  Service: Orthopedics;  Laterality: Right;  Amputation Right Great Toe at [facility]  . AMPUTATION Right 12/05/2018   Procedure: PARTIAL AMPUTATION FIRST RAY RIGHT FOOT;  Surgeon: Edrick Kins, DPM;  Location: [facility];  Service: Podiatry;  Laterality: Right;  . AMPUTATION Right 01/11/2021   Procedure: AMPUTATION BELOW KNEE;  Surgeon: Newt Minion,  MD;  Location: [facility];  Service: Orthopedics;  Laterality: Right;  . APPLICATION OF WOUND VAC Right 01/11/2021   Procedure:  APPLICATION OF WOUND VAC;  Surgeon: Newt Minion, MD;  Location: [facility];  Service: Orthopedics;  Laterality: Right;  . BONE BIOPSY Right 01/06/2021   Procedure: BONE BIOPSY;  Surgeon: Edrick Kins, DPM;  Location: [facility];  Service: Podiatry;  Laterality: Right;  . CARDIAC CATHETERIZATION  ?1990  . INCISION AND DRAINAGE Right 01/06/2021   Procedure: INCISION AND DRAINAGE, ANTIBIOTIC BEAD PLACEMENT;  Surgeon: Edrick Kins, DPM;  Location: [facility];  Service: Podiatry;  Laterality: Right;  . SPINE SURGERY     Family History  Problem Relation Age of Onset  . Cancer Mother   . Heart disease Father    Social History:  reports that he has been smoking cigarettes. He has a 40.00 pack-year smoking history. He has never used smokeless tobacco. He reports previous alcohol use. He reports previous drug use. Allergies:  Allergies  Allergen Reactions  . Vicodin [Hydrocodone-Acetaminophen] Itching   Medications Prior to Admission  Medication Sig Dispense Refill  . acetaminophen (TYLENOL) 500 MG tablet Take 1,500 mg by mouth every 6 (six) hours as needed.    Marland Kitchen amLODipine (NORVASC) 10 MG tablet Take 10 mg by mouth daily.    Marland Kitchen aspirin 81 MG tablet Take 81 mg by mouth at bedtime.     Marland Kitchen atenolol (TENORMIN) 50 MG tablet Take 50 mg by mouth daily.    . cloNIDine (CATAPRES) 0.1 MG tablet Take 0.1 mg by mouth 2 (two) times daily.    . collagenase (SANTYL) ointment Apply 1 application topically daily. 3.5cm x 3.5cm x 0.3cm (LxWxD) RT foot (Patient taking differently: Apply 1 application topically daily as needed (foot infection).) 30 g 3  . doxycycline (VIBRAMYCIN) 100 MG capsule Take 1 capsule (100 mg total) by mouth 2 (two) times daily. (Patient taking differently: Take 100 mg by mouth 2 (two) times daily. Start date : 01/02/21) 28 capsule 0  . FIASP FLEXTOUCH 100 UNIT/ML SOPN Inject 13 Units into the skin 3 (three) times daily.    . furosemide (LASIX) 20 MG tablet Take 20 mg by mouth 2 (two) times daily.     . hydrALAZINE (APRESOLINE) 25 MG tablet Take 12.5 mg by mouth 2 (two) times daily.    . hydrochlorothiazide (MICROZIDE) 12.5 MG capsule Take 12.5 mg by mouth daily.     . insulin glargine, 2 Unit Dial, (TOUJEO MAX SOLOSTAR) 300 UNIT/ML Solostar Pen Inject 40 Units into the skin at bedtime.    Marland Kitchen lisinopril (ZESTRIL) 40 MG tablet Take 40 mg by mouth daily.    . Omega-3 Fatty Acids (FISH OIL) 500 MG CAPS Take 500 mg by mouth at bedtime.     . pregabalin (LYRICA) 50 MG capsule Take 50 mg by mouth 3 (three) times daily.    . rosuvastatin (CRESTOR) 20 MG tablet Take 20 mg by mouth daily.    . sodium chloride (OCEAN) 0.65 % SOLN nasal spray Place 1 spray into both nostrils daily as needed for congestion.    . Continuous Blood Gluc Sensor (FREESTYLE LIBRE 14 DAY SENSOR) MISC ONE EACH (1 DEVICE DOSE) BY DOES NOT APPLY ROUTE EVERY 14 (FOURTEEN) DAYS.    Marland Kitchen gabapentin (NEURONTIN) 100 MG capsule Take 1 capsule (100 mg total) by mouth 3 (three) times daily. (Patient not taking: Reported on 01/04/2021) 90 capsule 0    Drug Regimen Review Drug regimen was reviewed  and remains appropriate with no significant issues identified  Home: Home Living Family/patient expects to be discharged to:: Private residence Living Arrangements: Spouse/significant other Available Help at Discharge: Family,Available 24 hours/day Type of Home: House Home Access: [place]: One level Bathroom Shower/Tub: Chiropodist: Standard Bathroom Accessibility: No (electric scooter will not fit into bathroom) Home Equipment: Walker - 2 wheels,Crutches,Bedside commode,Shower seat,Grab bars - tub/shower,Electric scooter,Other (comment) Additional Comments: Wife works full-time, but has taken time off and can work from home temporarily   Functional History: Prior Function Level of Independence: Independent with assistive device(s) Comments: Mod indep ambulating with RW; uses scooter provided at  grocery store. Wife drives. Has been getting into tub, uses seat for showers  Functional Status:  Mobility: Bed Mobility Overal bed mobility: Needs Assistance Bed Mobility: Rolling Rolling: Min assist General bed mobility comments: Increased time to move to edge of bed.  PTA managing lines and leads and supporting scrotum with pillow case due to discomfort when scooting. Transfers Overall transfer level: Needs assistance Equipment used: Rolling walker (2 wheeled) Transfers: Sit to/from Stand Sit to Stand: Mod assist,From elevated surface General transfer comment: Cues for hand placement to and from seated surface this session.  Pt required heavy mod assistance to boost into standing with increased time to extend hips, trunk and head. Ambulation/Gait Ambulation/Gait assistance: Min assist Gait Distance (Feet): 4 Feet Assistive device: Rolling walker (2 wheeled) Gait Pattern/deviations: Step-to pattern,Trunk flexed General Gait Details: Pt performed three hop to steps from bed to recliner.  Pt required assistance to turn RW and back to seated surface. Gait velocity: Decreased    ADL: ADL Overall ADL's : Needs assistance/impaired Eating/Feeding: Set up,Sitting Grooming: Set up,Sitting Upper Body Bathing: Set up,Sitting Lower Body Bathing: Moderate assistance,Sitting/lateral leans Upper Body Dressing : Set up,Sitting Lower Body Dressing: Moderate assistance,Sitting/lateral leans Lower Body Dressing Details (indicate cue type and reason): partial stand Toilet Transfer: Squat-pivot,Transfer board,Moderate Technical brewer Details (indicate cue type and reason): squat pivot vs lateral scoots to drop arm chair Toileting- Clothing Manipulation and Hygiene: Sitting/lateral lean,Sit to/from stand,Moderate assistance Toileting - Clothing Manipulation Details (indicate cue type and reason): partial stand General ADL Comments: Fabricating scrotal sling for edema  management. Education pt on use and wear.  Cognition: Cognition Overall Cognitive Status: Within Functional Limits for tasks assessed Orientation Level: Oriented X4 Cognition Arousal/Alertness: Awake/alert Behavior During Therapy: WFL for tasks assessed/performed Overall Cognitive Status: Within Functional Limits for tasks assessed General Comments: Very agreeable to session. Verablizing education back to therapist  Physical Exam: Blood pressure (!) 143/73, pulse (!) 56, temperature 98.6 F (37 C), temperature source Oral, resp. rate 16, height 6' 2.02" (1.88 m), weight 101.9 kg, SpO2 97 %. Physical Exam Genitourinary:    Comments: Patient with significant scrotal edema with Foley catheter tube in place. Skin:    Comments: Wound VAC in place to right transtibial amputation site.  Neurological:     Comments: Patient is alert in no acute distress.  Oriented x3 and follows commands.     Results for orders placed or performed during the hospital encounter of 01/04/21 (from the past 48 hour(s))  Glucose, capillary     Status: Abnormal   Collection Time: 01/13/21  6:36 AM  Result Value Ref Range   Glucose-Capillary 161 (H) 70 - 99 mg/dL    Comment: Glucose reference range applies only to samples taken after fasting for at least 8 hours.  Glucose, capillary     Status: Abnormal  Collection Time: 01/13/21 11:28 AM  Result Value Ref Range   Glucose-Capillary 115 (H) 70 - 99 mg/dL    Comment: Glucose reference range applies only to samples taken after fasting for at least 8 hours.  Glucose, capillary     Status: None   Collection Time: 01/13/21  4:05 PM  Result Value Ref Range   Glucose-Capillary 86 70 - 99 mg/dL    Comment: Glucose reference range applies only to samples taken after fasting for at least 8 hours.  Glucose, capillary     Status: Abnormal   Collection Time: 01/13/21  9:52 PM  Result Value Ref Range   Glucose-Capillary 104 (H) 70 - 99 mg/dL    Comment: Glucose  reference range applies only to samples taken after fasting for at least 8 hours.  Glucose, capillary     Status: None   Collection Time: 01/14/21  6:30 AM  Result Value Ref Range   Glucose-Capillary 75 70 - 99 mg/dL    Comment: Glucose reference range applies only to samples taken after fasting for at least 8 hours.  Comprehensive metabolic panel     Status: Abnormal   Collection Time: 01/14/21  7:47 AM  Result Value Ref Range   Sodium 134 (L) 135 - 145 mmol/L   Potassium 4.6 3.5 - 5.1 mmol/L   Chloride 107 98 - 111 mmol/L   CO2 16 (L) 22 - 32 mmol/L   Glucose, Bld 64 (L) 70 - 99 mg/dL    Comment: Glucose reference range applies only to samples taken after fasting for at least 8 hours.   BUN 71 (H) 8 - 23 mg/dL   Creatinine, Ser 5.98 (H) 0.61 - 1.24 mg/dL   Calcium 7.9 (L) 8.9 - 10.3 mg/dL   Total Protein 5.6 (L) 6.5 - 8.1 g/dL   Albumin 1.5 (L) 3.5 - 5.0 g/dL   AST 147 (H) 15 - 41 U/L   ALT 133 (H) 0 - 44 U/L   Alkaline Phosphatase 688 (H) 38 - 126 U/L   Total Bilirubin 3.6 (H) 0.3 - 1.2 mg/dL   GFR, Estimated 10 (L) >60 mL/min    Comment: (NOTE) Calculated using the CKD-EPI Creatinine Equation (2021)    Anion gap 11 5 - 15    Comment: Performed at [facility], [place] [address] [place], [place] [address]  CBC with Differential/Platelet     Status: Abnormal   Collection Time: 01/14/21  7:47 AM  Result Value Ref Range   WBC 12.3 (H) 4.0 - 10.5 K/uL   RBC 2.76 (L) 4.22 - 5.81 MIL/uL   Hemoglobin 8.3 (L) 13.0 - 17.0 g/dL   HCT 24.1 (L) 39.0 - 52.0 %   MCV 87.3 80.0 - 100.0 fL   MCH 30.1 26.0 - 34.0 pg   MCHC 34.4 30.0 - 36.0 g/dL   RDW 16.1 (H) 11.5 - 15.5 %   Platelets 248 150 - 400 K/uL   nRBC 0.0 0.0 - 0.2 %   Neutrophils Relative % 73 %   Neutro Abs 8.9 (H) 1.7 - 7.7 K/uL   Lymphocytes Relative 9 %   Lymphs Abs 1.1 0.7 - 4.0 K/uL   Monocytes Relative 14 %   Monocytes Absolute 1.7 (H) 0.1 - 1.0 K/uL   Eosinophils Relative 2 %   Eosinophils Absolute 0.3 0.0 -  0.5 K/uL   Basophils Relative 0 %   Basophils Absolute 0.0 0.0 - 0.1 K/uL   Immature Granulocytes 2 %   Abs  Immature Granulocytes 0.28 (H) 0.00 - 0.07 K/uL    Comment: Performed at [facility], [place] [address], [place], [place] [address]  Glucose, capillary     Status: None   Collection Time: 01/14/21 11:53 AM  Result Value Ref Range   Glucose-Capillary 81 70 - 99 mg/dL    Comment: Glucose reference range applies only to samples taken after fasting for at least 8 hours.  Glucose, capillary     Status: Abnormal   Collection Time: 01/14/21  4:11 PM  Result Value Ref Range   Glucose-Capillary 144 (H) 70 - 99 mg/dL    Comment: Glucose reference range applies only to samples taken after fasting for at least 8 hours.  Glucose, capillary     Status: Abnormal   Collection Time: 01/14/21  8:05 PM  Result Value Ref Range   Glucose-Capillary 113 (H) 70 - 99 mg/dL    Comment: Glucose reference range applies only to samples taken after fasting for at least 8 hours.  Comprehensive metabolic panel     Status: Abnormal   Collection Time: 01/15/21  1:55 AM  Result Value Ref Range   Sodium 134 (L) 135 - 145 mmol/L   Potassium 4.7 3.5 - 5.1 mmol/L   Chloride 105 98 - 111 mmol/L   CO2 18 (L) 22 - 32 mmol/L   Glucose, Bld 108 (H) 70 - 99 mg/dL    Comment: Glucose reference range applies only to samples taken after fasting for at least 8 hours.   BUN 70 (H) 8 - 23 mg/dL   Creatinine, Ser 5.86 (H) 0.61 - 1.24 mg/dL   Calcium 8.1 (L) 8.9 - 10.3 mg/dL   Total Protein 5.8 (L) 6.5 - 8.1 g/dL   Albumin 1.5 (L) 3.5 - 5.0 g/dL   AST 160 (H) 15 - 41 U/L   ALT 141 (H) 0 - 44 U/L   Alkaline Phosphatase 701 (H) 38 - 126 U/L   Total Bilirubin 3.5 (H) 0.3 - 1.2 mg/dL   GFR, Estimated 10 (L) >60 mL/min    Comment: (NOTE) Calculated using the CKD-EPI Creatinine Equation (2021)    Anion gap 11 5 - 15    Comment: Performed at [facility], [place] [address] [place], [place] [address]  CBC with  Differential/Platelet     Status: Abnormal   Collection Time: 01/15/21  1:55 AM  Result Value Ref Range   WBC 10.3 4.0 - 10.5 K/uL   RBC 2.62 (L) 4.22 - 5.81 MIL/uL   Hemoglobin 8.0 (L) 13.0 - 17.0 g/dL   HCT 23.1 (L) 39.0 - 52.0 %   MCV 88.2 80.0 - 100.0 fL   MCH 30.5 26.0 - 34.0 pg   MCHC 34.6 30.0 - 36.0 g/dL   RDW 16.4 (H) 11.5 - 15.5 %   Platelets 230 150 - 400 K/uL    Comment: REPEATED TO VERIFY   nRBC 0.0 0.0 - 0.2 %   Neutrophils Relative % 74 %   Neutro Abs 7.5 1.7 - 7.7 K/uL   Lymphocytes Relative 9 %   Lymphs Abs 0.9 0.7 - 4.0 K/uL   Monocytes Relative 13 %   Monocytes Absolute 1.4 (H) 0.1 - 1.0 K/uL   Eosinophils Relative 2 %   Eosinophils Absolute 0.2 0.0 - 0.5 K/uL   Basophils Relative 0 %   Basophils Absolute 0.0 0.0 - 0.1 K/uL   Immature Granulocytes 2 %   Abs Immature Granulocytes 0.25 (H) 0.00 - 0.07 K/uL  Comment: Performed at [facility], [place] [address], [place], [place] [address]   No results found.     Medical Problem List and Plan: 1.  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  Wound VAC as directed  -patient may *** shower  -ELOS/Goals: *** 2.  Antithrombotics: -DVT/anticoagulation: SCDs left lower extremity  -antiplatelet therapy: Aspirin 81 mg daily 3. Pain Management: Lyrica 50 mg 3 times daily, Robaxin and oxycodone as needed 4. Mood: Provide emotional support  -antipsychotic agents: N/A 5. Neuropsych: This patient is capable of making decisions on his own behalf. 6. Skin/Wound Care: Routine skin checks 7. Fluids/Electrolytes/Nutrition: Routine in and outs with follow-up chemistries 8.  Acute on chronic anemia.  Continue Niferex.  Follow-up CBC 9.  AKI/ATN.  Follow-up nephrology services.  No current plan for hemodialysis 10.  Hypertension.  Hydralazine 50 mg twice daily, Norvasc 10 mg daily, clonidine 0.1 mg twice daily, Tenormin 50 mg daily.  Monitor with increased mobility 11.  Diabetes  mellitus with peripheral neuropathy.  Hemoglobin A1c 10.5.  NovoLog 4 units 3 times daily, Lantus insulin 35 units nightly.  Check blood sugars before meals and at bedtime 12.  Hyperlipidemia.  Lovaza 13.  Urinary retention with scrotal edema.  Flomax 0.4 mg daily.  Continue Foley tube for now until scrotal edema improves and then plan voiding trial. 14.  History of tobacco abuse.  Counseling  ***  Cathlyn Parsons, PA-C 01/15/2021

## 2021-01-15 NOTE — Plan of Care (Signed)

## 2021-01-15 NOTE — Progress Notes (Signed)
Inpatient Diabetes Program Recommendations  AACE/ADA: New Consensus Statement on Inpatient Glycemic Control (2015)  Target Ranges:  Prepandial:   less than 140 mg/dL      Peak postprandial:   less than 180 mg/dL (1-2 hours)      Critically ill patients:  140 - 180 mg/dL   Lab Results  Component Value Date   GLUCAP 104 (H) 01/15/2021   HGBA1C 10.5 (H) 01/04/2021    Review of Glycemic Control Results for MERIK, MORELAN (MRN [id number]) as of 01/15/2021 15:27  Ref. Range 01/15/2021 11:35 01/15/2021 12:17 01/15/2021 12:49  Glucose-Capillary Latest Ref Range: 70 - 99 mg/dL 55 (L) 69 (L) 104 (H)   Diabetes history: Type 2 DM Outpatient Diabetes medications: Touejo 40 units QHS, Fiasp 12 units TID Current orders for Inpatient glycemic control: Lantus 35 unit QHS, Novolog 4 units TID, Novolog 0-15 units TID, Novolog 0-5 units QHS  Inpatient Diabetes Program Recommendations:    Noted hypoglycemia this AM of 55 mg/dL. Consider reducing Lantus to 25 units QHS given renal status.   Thanks, Bronson Curb, MSN, RNC-OB Diabetes Coordinator [phone] (8a-5p)

## 2021-01-16 ENCOUNTER — Inpatient Hospital Stay (HOSPITAL_COMMUNITY)
Admission: RE | Admit: 2021-01-16 | Discharge: 2021-01-28 | DRG: 559 | Disposition: A | Discharge status: Home Health | Payer: BC Managed Care – PPO | Source: Intra-hospital | Attending: Physical Medicine and Rehabilitation | Admitting: Physical Medicine and Rehabilitation

## 2021-01-16 ENCOUNTER — Other Ambulatory Visit: Payer: Self-pay

## 2021-01-16 ENCOUNTER — Encounter (HOSPITAL_COMMUNITY): Payer: Self-pay | Admitting: Physical Medicine and Rehabilitation

## 2021-01-16 DIAGNOSIS — F419 Anxiety disorder, unspecified: Secondary | ICD-10-CM | POA: Diagnosis present

## 2021-01-16 DIAGNOSIS — E1122 Type 2 diabetes mellitus with diabetic chronic kidney disease: Secondary | ICD-10-CM | POA: Diagnosis present

## 2021-01-16 DIAGNOSIS — N5089 Other specified disorders of the male genital organs: Secondary | ICD-10-CM | POA: Diagnosis present

## 2021-01-16 DIAGNOSIS — Z89511 Acquired absence of right leg below knee: Secondary | ICD-10-CM | POA: Diagnosis not present

## 2021-01-16 DIAGNOSIS — E11649 Type 2 diabetes mellitus with hypoglycemia without coma: Secondary | ICD-10-CM | POA: Diagnosis not present

## 2021-01-16 DIAGNOSIS — R339 Retention of urine, unspecified: Secondary | ICD-10-CM | POA: Diagnosis present

## 2021-01-16 DIAGNOSIS — I10 Essential (primary) hypertension: Secondary | ICD-10-CM | POA: Diagnosis present

## 2021-01-16 DIAGNOSIS — E43 Unspecified severe protein-calorie malnutrition: Secondary | ICD-10-CM | POA: Diagnosis present

## 2021-01-16 DIAGNOSIS — E872 Acidosis: Secondary | ICD-10-CM | POA: Diagnosis present

## 2021-01-16 DIAGNOSIS — G4700 Insomnia, unspecified: Secondary | ICD-10-CM | POA: Diagnosis present

## 2021-01-16 DIAGNOSIS — Z8249 Family history of ischemic heart disease and other diseases of the circulatory system: Secondary | ICD-10-CM | POA: Diagnosis not present

## 2021-01-16 DIAGNOSIS — F17210 Nicotine dependence, cigarettes, uncomplicated: Secondary | ICD-10-CM | POA: Diagnosis present

## 2021-01-16 DIAGNOSIS — Z794 Long term (current) use of insulin: Secondary | ICD-10-CM

## 2021-01-16 DIAGNOSIS — E8770 Fluid overload, unspecified: Secondary | ICD-10-CM | POA: Diagnosis present

## 2021-01-16 DIAGNOSIS — N170 Acute kidney failure with tubular necrosis: Secondary | ICD-10-CM | POA: Diagnosis present

## 2021-01-16 DIAGNOSIS — I5033 Acute on chronic diastolic (congestive) heart failure: Secondary | ICD-10-CM | POA: Diagnosis not present

## 2021-01-16 DIAGNOSIS — R7401 Elevation of levels of liver transaminase levels: Secondary | ICD-10-CM | POA: Diagnosis present

## 2021-01-16 DIAGNOSIS — N184 Chronic kidney disease, stage 4 (severe): Secondary | ICD-10-CM | POA: Diagnosis not present

## 2021-01-16 DIAGNOSIS — N1831 Chronic kidney disease, stage 3a: Secondary | ICD-10-CM | POA: Diagnosis present

## 2021-01-16 DIAGNOSIS — R0609 Other forms of dyspnea: Secondary | ICD-10-CM | POA: Diagnosis not present

## 2021-01-16 DIAGNOSIS — Z79899 Other long term (current) drug therapy: Secondary | ICD-10-CM | POA: Diagnosis not present

## 2021-01-16 DIAGNOSIS — E1151 Type 2 diabetes mellitus with diabetic peripheral angiopathy without gangrene: Secondary | ICD-10-CM | POA: Diagnosis present

## 2021-01-16 DIAGNOSIS — D638 Anemia in other chronic diseases classified elsewhere: Secondary | ICD-10-CM | POA: Diagnosis present

## 2021-01-16 DIAGNOSIS — N179 Acute kidney failure, unspecified: Secondary | ICD-10-CM | POA: Diagnosis not present

## 2021-01-16 DIAGNOSIS — M86271 Subacute osteomyelitis, right ankle and foot: Secondary | ICD-10-CM

## 2021-01-16 DIAGNOSIS — G546 Phantom limb syndrome with pain: Secondary | ICD-10-CM | POA: Diagnosis present

## 2021-01-16 DIAGNOSIS — Z7982 Long term (current) use of aspirin: Secondary | ICD-10-CM

## 2021-01-16 DIAGNOSIS — R001 Bradycardia, unspecified: Secondary | ICD-10-CM

## 2021-01-16 DIAGNOSIS — E1142 Type 2 diabetes mellitus with diabetic polyneuropathy: Secondary | ICD-10-CM | POA: Diagnosis present

## 2021-01-16 DIAGNOSIS — Z4781 Encounter for orthopedic aftercare following surgical amputation: Secondary | ICD-10-CM | POA: Diagnosis present

## 2021-01-16 DIAGNOSIS — I129 Hypertensive chronic kidney disease with stage 1 through stage 4 chronic kidney disease, or unspecified chronic kidney disease: Secondary | ICD-10-CM | POA: Diagnosis present

## 2021-01-16 DIAGNOSIS — F4320 Adjustment disorder, unspecified: Secondary | ICD-10-CM | POA: Diagnosis not present

## 2021-01-16 DIAGNOSIS — J811 Chronic pulmonary edema: Secondary | ICD-10-CM | POA: Diagnosis present

## 2021-01-16 DIAGNOSIS — R0602 Shortness of breath: Secondary | ICD-10-CM

## 2021-01-16 DIAGNOSIS — E785 Hyperlipidemia, unspecified: Secondary | ICD-10-CM | POA: Diagnosis present

## 2021-01-16 LAB — CBC WITH DIFFERENTIAL/PLATELET
Abs Immature Granulocytes: 0.21 10*3/uL — ABNORMAL HIGH (ref 0.00–0.07)
Basophils Absolute: 0.1 10*3/uL (ref 0.0–0.1)
Basophils Relative: 1 %
Eosinophils Absolute: 0.2 10*3/uL (ref 0.0–0.5)
Eosinophils Relative: 2 %
HCT: 20.9 % — ABNORMAL LOW (ref 39.0–52.0)
Hemoglobin: 7.6 g/dL — ABNORMAL LOW (ref 13.0–17.0)
Immature Granulocytes: 2 %
Lymphocytes Relative: 11 %
Lymphs Abs: 1 10*3/uL (ref 0.7–4.0)
MCH: 31.7 pg (ref 26.0–34.0)
MCHC: 36.4 g/dL — ABNORMAL HIGH (ref 30.0–36.0)
MCV: 87.1 fL (ref 80.0–100.0)
Monocytes Absolute: 1.3 10*3/uL — ABNORMAL HIGH (ref 0.1–1.0)
Monocytes Relative: 14 %
Neutro Abs: 6.6 10*3/uL (ref 1.7–7.7)
Neutrophils Relative %: 70 %
Platelets: 222 10*3/uL (ref 150–400)
RBC: 2.4 MIL/uL — ABNORMAL LOW (ref 4.22–5.81)
RDW: 16.7 % — ABNORMAL HIGH (ref 11.5–15.5)
WBC: 9.4 10*3/uL (ref 4.0–10.5)
nRBC: 0 % (ref 0.0–0.2)

## 2021-01-16 LAB — PHOSPHORUS: Phosphorus: 8 mg/dL — ABNORMAL HIGH (ref 2.5–4.6)

## 2021-01-16 LAB — COMPREHENSIVE METABOLIC PANEL
ALT: 156 U/L — ABNORMAL HIGH (ref 0–44)
AST: 151 U/L — ABNORMAL HIGH (ref 15–41)
Albumin: 1.5 g/dL — ABNORMAL LOW (ref 3.5–5.0)
Alkaline Phosphatase: 681 U/L — ABNORMAL HIGH (ref 38–126)
Anion gap: 11 (ref 5–15)
BUN: 72 mg/dL — ABNORMAL HIGH (ref 8–23)
CO2: 16 mmol/L — ABNORMAL LOW (ref 22–32)
Calcium: 7.8 mg/dL — ABNORMAL LOW (ref 8.9–10.3)
Chloride: 105 mmol/L (ref 98–111)
Creatinine, Ser: 5.76 mg/dL — ABNORMAL HIGH (ref 0.61–1.24)
GFR, Estimated: 10 mL/min — ABNORMAL LOW (ref >60)
Glucose, Bld: 101 mg/dL — ABNORMAL HIGH (ref 70–99)
Potassium: 4.2 mmol/L (ref 3.5–5.1)
Sodium: 132 mmol/L — ABNORMAL LOW (ref 135–145)
Total Bilirubin: 3.5 mg/dL — ABNORMAL HIGH (ref 0.3–1.2)
Total Protein: 5.6 g/dL — ABNORMAL LOW (ref 6.5–8.1)

## 2021-01-16 LAB — PREPARE RBC (CROSSMATCH)

## 2021-01-16 LAB — CBC
HCT: 27.1 % — ABNORMAL LOW (ref 39.0–52.0)
Hemoglobin: 9.4 g/dL — ABNORMAL LOW (ref 13.0–17.0)
MCH: 30.7 pg (ref 26.0–34.0)
MCHC: 34.7 g/dL (ref 30.0–36.0)
MCV: 88.6 fL (ref 80.0–100.0)
Platelets: 232 10*3/uL (ref 150–400)
RBC: 3.06 MIL/uL — ABNORMAL LOW (ref 4.22–5.81)
RDW: 17.7 % — ABNORMAL HIGH (ref 11.5–15.5)
WBC: 11.3 10*3/uL — ABNORMAL HIGH (ref 4.0–10.5)
nRBC: 0 % (ref 0.0–0.2)

## 2021-01-16 LAB — GLUCOSE, CAPILLARY
Glucose-Capillary: 72 mg/dL (ref 70–99)
Glucose-Capillary: 139 mg/dL — ABNORMAL HIGH (ref 70–99)
Glucose-Capillary: 144 mg/dL — ABNORMAL HIGH (ref 70–99)
Glucose-Capillary: 160 mg/dL — ABNORMAL HIGH (ref 70–99)

## 2021-01-16 LAB — CULTURE, BLOOD (ROUTINE X 2)
Culture: NO GROWTH
Culture: NO GROWTH
Special Requests: ADEQUATE
Special Requests: ADEQUATE

## 2021-01-16 MED ORDER — TAMSULOSIN HCL 0.4 MG PO CAPS: 0.4000 mg | ORAL_CAPSULE | Freq: Every day | ORAL | Status: DC

## 2021-01-16 MED ORDER — CLONIDINE HCL 0.1 MG PO TABS
0.1000 mg | ORAL_TABLET | Freq: Two times a day (BID) | ORAL | Status: DC
  Administered 2021-01-16 – 2021-01-22 (×12): 0.1 mg via ORAL
  Filled 2021-01-16 (×12): qty 1

## 2021-01-16 MED ORDER — DULOXETINE HCL 20 MG PO CPEP
20.0000 mg | ORAL_CAPSULE | Freq: Every day | ORAL | Status: DC
  Administered 2021-01-16: 20 mg via ORAL
  Filled 2021-01-16 (×2): qty 1

## 2021-01-16 MED ORDER — OXYCODONE HCL 5 MG PO TABS
5.0000 mg | ORAL_TABLET | ORAL | Status: DC | PRN
  Administered 2021-01-24 (×2): 10 mg via ORAL
  Filled 2021-01-16: qty 2
  Filled 2021-01-16: qty 1
  Filled 2021-01-16 (×7): qty 2

## 2021-01-16 MED ORDER — DOCUSATE SODIUM 100 MG PO CAPS
100.0000 mg | ORAL_CAPSULE | Freq: Two times a day (BID) | ORAL | Status: DC
  Administered 2021-01-16 – 2021-01-25 (×14): 100 mg via ORAL
  Filled 2021-01-16 (×21): qty 1

## 2021-01-16 MED ORDER — SODIUM CHLORIDE 0.9% IV SOLUTION: Freq: Once | INTRAVENOUS | Status: AC

## 2021-01-16 MED ORDER — METHOCARBAMOL 500 MG PO TABS
500.0000 mg | ORAL_TABLET | Freq: Four times a day (QID) | ORAL | Status: DC | PRN
  Administered 2021-01-17 – 2021-01-25 (×12): 500 mg via ORAL
  Filled 2021-01-16 (×14): qty 1

## 2021-01-16 MED ORDER — INSULIN ASPART 100 UNIT/ML ~~LOC~~ SOLN
4.0000 [IU] | Freq: Three times a day (TID) | SUBCUTANEOUS | Status: DC
Start: 2021-01-17 — End: 2021-01-19
  Administered 2021-01-17 – 2021-01-19 (×6): 4 [IU] via SUBCUTANEOUS

## 2021-01-16 MED ORDER — INSULIN ASPART 100 UNIT/ML ~~LOC~~ SOLN: 4.0000 [IU] | Freq: Three times a day (TID) | SUBCUTANEOUS | 11 refills | Status: DC

## 2021-01-16 MED ORDER — OXYCODONE HCL 5 MG PO TABS
10.0000 mg | ORAL_TABLET | ORAL | Status: DC | PRN
  Administered 2021-01-16 – 2021-01-21 (×20): 15 mg via ORAL
  Filled 2021-01-16 (×18): qty 3
  Filled 2021-01-16: qty 2
  Filled 2021-01-16: qty 3

## 2021-01-16 MED ORDER — METHOCARBAMOL 500 MG PO TABS: 500.0000 mg | ORAL_TABLET | Freq: Four times a day (QID) | ORAL | Status: DC | PRN

## 2021-01-16 MED ORDER — METHOCARBAMOL 1000 MG/10ML IJ SOLN
500.0000 mg | Freq: Four times a day (QID) | INTRAVENOUS | Status: DC | PRN
  Filled 2021-01-16: qty 5

## 2021-01-16 MED ORDER — OMEGA-3-ACID ETHYL ESTERS 1 G PO CAPS
1.0000 g | ORAL_CAPSULE | Freq: Every day | ORAL | Status: DC
  Administered 2021-01-17 – 2021-01-28 (×12): 1 g via ORAL
  Filled 2021-01-16 (×12): qty 1

## 2021-01-16 MED ORDER — SENNOSIDES-DOCUSATE SODIUM 8.6-50 MG PO TABS: 1.0000 | ORAL_TABLET | Freq: Every evening | ORAL | Status: DC | PRN

## 2021-01-16 MED ORDER — INSULIN ASPART 100 UNIT/ML ~~LOC~~ SOLN: 0.0000 [IU] | Freq: Three times a day (TID) | SUBCUTANEOUS | 11 refills | Status: DC

## 2021-01-16 MED ORDER — ONDANSETRON HCL 4 MG PO TABS: 4.0000 mg | ORAL_TABLET | Freq: Four times a day (QID) | ORAL | Status: DC | PRN

## 2021-01-16 MED ORDER — BISACODYL 10 MG RE SUPP: 10.0000 mg | Freq: Every day | RECTAL | Status: DC | PRN

## 2021-01-16 MED ORDER — SEVELAMER CARBONATE 800 MG PO TABS
800.0000 mg | ORAL_TABLET | Freq: Three times a day (TID) | ORAL | Status: DC
  Administered 2021-01-17 – 2021-01-28 (×34): 800 mg via ORAL
  Filled 2021-01-16 (×34): qty 1

## 2021-01-16 MED ORDER — ONDANSETRON HCL 4 MG/2ML IJ SOLN: 4.0000 mg | Freq: Four times a day (QID) | INTRAMUSCULAR | Status: DC | PRN

## 2021-01-16 MED ORDER — HYDRALAZINE HCL 50 MG PO TABS
50.0000 mg | ORAL_TABLET | Freq: Two times a day (BID) | ORAL | Status: DC
  Administered 2021-01-16 – 2021-01-26 (×20): 50 mg via ORAL
  Filled 2021-01-16 (×20): qty 1

## 2021-01-16 MED ORDER — ACETAMINOPHEN 325 MG PO TABS
325.0000 mg | ORAL_TABLET | Freq: Four times a day (QID) | ORAL | Status: DC | PRN
  Administered 2021-01-17: 650 mg via ORAL
  Filled 2021-01-16: qty 2

## 2021-01-16 MED ORDER — INSULIN GLARGINE 100 UNIT/ML ~~LOC~~ SOLN: 25.0000 [IU] | Freq: Every day | SUBCUTANEOUS | 11 refills | Status: DC

## 2021-01-16 MED ORDER — POLYSACCHARIDE IRON COMPLEX 150 MG PO CAPS
150.0000 mg | ORAL_CAPSULE | Freq: Every day | ORAL | Status: DC
  Administered 2021-01-17 – 2021-01-25 (×9): 150 mg via ORAL
  Filled 2021-01-16 (×9): qty 1

## 2021-01-16 MED ORDER — SEVELAMER CARBONATE 800 MG PO TABS: 800.0000 mg | ORAL_TABLET | Freq: Three times a day (TID) | ORAL | Status: DC

## 2021-01-16 MED ORDER — AMLODIPINE BESYLATE 10 MG PO TABS
10.0000 mg | ORAL_TABLET | Freq: Every day | ORAL | Status: DC
  Administered 2021-01-17 – 2021-01-28 (×12): 10 mg via ORAL
  Filled 2021-01-16 (×12): qty 1

## 2021-01-16 MED ORDER — PREGABALIN 25 MG PO CAPS
50.0000 mg | ORAL_CAPSULE | Freq: Three times a day (TID) | ORAL | Status: DC
  Administered 2021-01-16 – 2021-01-28 (×35): 50 mg via ORAL
  Filled 2021-01-16 (×35): qty 2

## 2021-01-16 MED ORDER — ASPIRIN EC 81 MG PO TBEC
81.0000 mg | DELAYED_RELEASE_TABLET | Freq: Every day | ORAL | Status: DC
  Administered 2021-01-16 – 2021-01-27 (×12): 81 mg via ORAL
  Filled 2021-01-16 (×12): qty 1

## 2021-01-16 MED ORDER — TAMSULOSIN HCL 0.4 MG PO CAPS
0.4000 mg | ORAL_CAPSULE | Freq: Every day | ORAL | Status: DC
  Administered 2021-01-17 – 2021-01-27 (×11): 0.4 mg via ORAL
  Filled 2021-01-16 (×11): qty 1

## 2021-01-16 MED ORDER — POLYETHYLENE GLYCOL 3350 17 G PO PACK: 17.0000 g | PACK | Freq: Every day | ORAL | 0 refills | Status: AC | PRN

## 2021-01-16 MED ORDER — ATENOLOL 50 MG PO TABS
50.0000 mg | ORAL_TABLET | Freq: Every day | ORAL | Status: DC
  Administered 2021-01-16 – 2021-01-21 (×6): 50 mg via ORAL
  Filled 2021-01-16 (×7): qty 1

## 2021-01-16 MED ORDER — INSULIN ASPART 100 UNIT/ML ~~LOC~~ SOLN
0.0000 [IU] | Freq: Three times a day (TID) | SUBCUTANEOUS | Status: DC
  Administered 2021-01-17: 3 [IU] via SUBCUTANEOUS
  Administered 2021-01-17 – 2021-01-19 (×4): 2 [IU] via SUBCUTANEOUS
  Administered 2021-01-19: 3 [IU] via SUBCUTANEOUS
  Administered 2021-01-20 – 2021-01-26 (×7): 2 [IU] via SUBCUTANEOUS
  Administered 2021-01-26 – 2021-01-27 (×2): 3 [IU] via SUBCUTANEOUS

## 2021-01-16 MED ORDER — ACETAMINOPHEN 325 MG PO TABS: 325.0000 mg | ORAL_TABLET | Freq: Four times a day (QID) | ORAL | Status: AC | PRN

## 2021-01-16 MED ORDER — OXYCODONE HCL 10 MG PO TABS: 10.0000 mg | ORAL_TABLET | ORAL | 0 refills | Status: DC | PRN

## 2021-01-16 MED ORDER — POLYSACCHARIDE IRON COMPLEX 150 MG PO CAPS: 150.0000 mg | ORAL_CAPSULE | Freq: Every day | ORAL | Status: DC

## 2021-01-16 MED ORDER — POLYETHYLENE GLYCOL 3350 17 G PO PACK: 17.0000 g | PACK | Freq: Every day | ORAL | Status: DC | PRN

## 2021-01-16 MED ORDER — TRAZODONE HCL 50 MG PO TABS
25.0000 mg | ORAL_TABLET | Freq: Every day | ORAL | Status: DC
  Administered 2021-01-16 – 2021-01-27 (×12): 50 mg via ORAL
  Filled 2021-01-16 (×12): qty 1

## 2021-01-16 NOTE — Plan of Care (Signed)
  Problem: Education: Goal: Knowledge of General Education information will improve Description: Including pain rating scale, medication(s)/side effects and non-pharmacologic comfort measures Outcome: Progressing   Problem: Health Behavior/Discharge Planning: Goal: Ability to manage health-related needs will improve Outcome: Progressing   Problem: Activity: Goal: Risk for activity intolerance will decrease Outcome: Progressing   Problem: Pain Managment: Goal: General experience of comfort will improve Outcome: Progressing   Problem: Skin Integrity: Goal: Risk for impaired skin integrity will decrease Outcome: Progressing   

## 2021-01-16 NOTE — Progress Notes (Signed)
Patient transferred to CIR, room 5C09, via bed. Wife accompanied patient to new room. Transfer delayed due to blood transfusion.  Massie Bougie, RN

## 2021-01-16 NOTE — Progress Notes (Signed)
[facility] Progress Note   Subjective:   Had 2.9 liter UOP over 2/25.  He's been told going to rehab today after lunch.  We discussed risks/benefits/indications for PRBC's and he consents to blood transfusion  Review of systems:    Denies shortness of breath at rest but has with exertion Scrotal swelling Denies n/v No cp   Objective Vitals:   01/15/21 1532 01/15/21 1947 01/16/21 0458 01/16/21 0738  BP: (!) 161/78 (!) 147/67 (!) 143/68 135/66  Pulse: 60 (!) 57 (!) 54 (!) 51  Resp: '17 15 17 18  '$ Temp: 98.5 F (36.9 C) 98.4 F (36.9 C) 98.3 F (36.8 C) 98.2 F (36.8 C)  TempSrc: Oral Oral Oral Oral  SpO2: 97% 96% 96% 98%  Weight:      Height:         Additional Objective Labs: Basic Metabolic Panel: Recent Labs  Lab 01/11/21 0306 01/12/21 0335 01/13/21 0242 01/14/21 0747 01/15/21 0155 01/16/21 0153  NA 133*   < > 131* 134* 134* 132*  K 4.2   < > 4.3 4.6 4.7 4.2  CL 107   < > 104 107 105 105  CO2 16*   < > 16* 16* 18* 16*  GLUCOSE 154*   < > 173* 64* 108* 101*  BUN 63*   < > 67* 71* 70* 72*  CREATININE 5.96*   < > 5.78* 5.98* 5.86* 5.76*  CALCIUM 7.9*   < > 7.7* 7.9* 8.1* 7.8*  PHOS 6.1*  --  6.7*  --   --  8.0*   < > = values in this interval not displayed.   CBC: Recent Labs  Lab 01/11/21 0306 01/13/21 0242 01/14/21 0747 01/15/21 0155 01/16/21 0153  WBC 11.8* 13.4* 12.3* 10.3 9.4  NEUTROABS 8.1* 10.1* 8.9* 7.5 6.6  HGB 9.2* 8.2* 8.3* 8.0* 7.6*  HCT 26.5* 23.8* 24.1* 23.1* 20.9*  MCV 88.0 88.5 87.3 88.2 87.1  PLT 215 227 248 230 222   Blood Culture    Component Value Date/Time   SDES BLOOD LEFT HAND 01/11/2021 1855   SDES BLOOD LEFT HAND 01/11/2021 1855   SPECREQUEST  01/11/2021 1855    BOTTLES DRAWN AEROBIC ONLY Blood Culture adequate volume   SPECREQUEST  01/11/2021 1855    BOTTLES DRAWN AEROBIC ONLY Blood Culture adequate volume   CULT  01/11/2021 1855    NO GROWTH 4 DAYS Performed at [facility], [place] [address] [place], [place] [address]    CULT  01/11/2021 1855    NO GROWTH 4 DAYS Performed at [facility] [address] [place], [place] [address]    REPTSTATUS PENDING 01/11/2021 1855   REPTSTATUS PENDING 01/11/2021 1855     Physical Exam   General adult male in bed in no acute distress HEENT normocephalic atraumatic extraocular movements intact sclera anicteric Neck supple trachea midline Lungs clear to auscultation bilaterally normal work of breathing at rest  Heart S1S2 no rub Abdomen soft nontender nondistended Extremities right BKA; left leg 2+ edema; no arm edema Psych normal mood and affect Neuro - alert and oriented x 3 provides hx and follows commands GU scrotal edema; has foley   Medications: . sodium chloride Stopped (01/12/21 0053)  . methocarbamol (ROBAXIN) IV     . amLODipine  10 mg Oral Daily  . aspirin EC  81 mg Oral QHS  . atenolol  50 mg Oral Daily  . cloNIDine  0.1 mg Oral BID  . docusate  sodium  100 mg Oral BID  . hydrALAZINE  50 mg Oral BID  . insulin aspart  0-15 Units Subcutaneous TID WC  . insulin aspart  0-5 Units Subcutaneous QHS  . insulin aspart  4 Units Subcutaneous TID WC  . insulin glargine  28 Units Subcutaneous QHS  . iron polysaccharides  150 mg Oral Daily  . omega-3 acid ethyl esters  1 g Oral Daily  . pregabalin  50 mg Oral TID  . sevelamer carbonate  800 mg Oral TID WC  . tamsulosin  0.4 mg Oral QPC supper     Assessment/Plan: 1. AKI: b/l creat from mid 2021 was 1.09.  Here creat 4.0 on admit 2/15, then peaked at 5.96 on 2/21.  Suspect ATN but urinary retention as below.  Renal ultrasound consistent with medical renal disease. Lisinopril, HCTZ dc'd - No indication for RRT at this time.  Essentially stable but quite impaired and has tolerated lasix - PRBC's today to optimize - lasix holiday today then nephro will assess resuming tomorrow after the blood is in - renal carb modified diet.   2. Right foot osteomyelitis:  S/p  debridement. 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 Per orthopedics.   3. HTN: Lisinopril/HCTZ held. Acceptable control  4. Anemia-  Secondary to chronic illness. Tsat low, holding IV Fe in the setting of active infection. AKI contributing as well.  aranesp 40 mcg once on 2/23.  On oral iron.  PRBC's on 2/26 ordered   5. hyperphosphatemia - renal diet. Started renvela as binder   6. DM - Insulin per primary team.   7. Urinary retention: new per patient the last couple of weeks.  on flomax.  Now with foley and continue same.   CIR today is planned  Nephrology will follow at [place], MD 01/16/2021  9:44 AM

## 2021-01-16 NOTE — Progress Notes (Signed)
To rehab unit alert patient accompanied by RN, NT and wife. Wound vac, foley  Noted. Foley care and CHG done. Oriented to unit set up.

## 2021-01-16 NOTE — Discharge Summary (Addendum)
Physician Discharge Summary  Fernando Anthony [id number] DOB: Jun 11, 1956 DOA: 01/04/2021  PCP: Curly Rim, MD  Admit date: 01/04/2021 Discharge date: 01/16/2021  Admitted From: Home Disposition: Acute inpatient rehab  Recommendations for Outpatient Follow-up:  1. Follow up with PCP in 1-2 weeks after discharge. 2. Please check CMP/CBC every other day or as ordered by nephrology. 3. Patient will be seen by nephrology, followed by orthopedics at rehab.  Home Health: Not applicable Equipment/Devices: Not applicable  Discharge Condition: Stable CODE STATUS: Full code Diet recommendation: Low-salt and low-carb diet.  Discharge summary: 65 year old gentleman with insulin-dependent diabetes, diabetic foot ulcer right with multiple procedures in the past, hypertension , hyperlipidemia who was following at podiatry office for long time and had multiple debridements presented with calcaneal osteomyelitis.  Admitted to the hospital for surgical debridement.  Persistent infection so needing amputation to clean margin and transferred to [facility].  Patient also developed acute kidney injury, acute liver failure and nephrology is following.  #1 .right foot osteomyelitis/diabetic foot infection.  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 Nonweightbearing right amputation the stump. Has wound VAC on, continue.  Will be followed by orthopedics. Daptomycin 2/14-2/18 Rocephin 2/14--2/23.   Pain management was a challenge.  Today, able to keep up on oral oxycodone 10 mg every 4 hours as needed.  Patient on Lyrica from home that is continued. Need to be careful with Tylenol given mildly abnormal liver functions.  #2 .acute kidney injury with azotemia: Baseline creatinine 1.2.  Presented with significant worsening renal functions. Urine output is adequate.  Ultrasound of the kidneys consistent with  medical renal disease, no evidence of hydronephrosis. Avoiding all nephrotoxins.   Followed by nephrology.  Patient had urinary retention after surgery.  Had multiple straight cath done. Foley catheter on 2/23, now on intermittent Lasix trial.  He does have anasarca and scrotal edema. Urinating normal, he had more than 3 L urine output in 24 hours.  Some improvement of scrotal edema. Patient will be followed by nephrology at rehab.  #3 .uncontrolled type 2 diabetes with hyperglycemia and diabetic foot infection: Currently blood sugars are well controlled on Lantus 25 units, prandial insulin 4 units and sliding scale insulin.  A1c is more than 10.  His insulin requirement has slightly gone down today, may need to titrate as per blood sugars.  #4 .essential hypertension: Stable on amlodipine, atenolol, clonidine and hydralazine.  Discontinued hydrochlorothiazide and lisinopril due to AKI.  #5 .abnormal LFTs: Bilirubin and transaminases remain slightly elevated but stable. INR normal.   May have suffered mild shock liver secondary to acute infection and hypovolemia.   Right upper quadrant ultrasound showed some fluid around the gallbladder but no evidence of a stone.  CBD was 3 mm.   Patient never had any abdominal pain.  Unlikely acalculus cholecystitis.  Please recheck to ensure stabilization.  #6. acute on chronic anemia: Hemoglobin 7.6 probably related to acute illness.  Planning for transfusion 1 unit PRBC today before transfer to rehab.  Patient medically stabilizing.  He can be transferred to acute inpatient rehab to work with multidisciplinary rehab along with complex medical follow-up.  Addendum 01/20/21 4:30 PM this is diagnostic clarification only as requested: Sepsis ruled out.   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)   Hypertension, essential, benign   Abnormal LFTs    Hyponatremia   Hyperlipidemia  associated with type 2 diabetes mellitus ([place])   AKI (acute kidney injury) ([place])   Anemia   Acute osteomyelitis of right calcaneus (HCC)   Severe protein-calorie malnutrition [facility])    Discharge Instructions  Discharge Instructions    Call MD for:  redness, tenderness, or signs of infection (pain, swelling, redness, odor or green/yellow discharge around incision site)   Complete by: As directed    Call MD for:  severe uncontrolled pain   Complete by: As directed    Call MD for:  temperature >100.4   Complete by: As directed    Diet - low sodium heart healthy   Complete by: As directed    Diet Carb Modified   Complete by: As directed    Increase activity slowly   Complete by: As directed    Leave dressing on - Keep it clean, dry, and intact until clinic visit   Complete by: As directed      Allergies as of 01/16/2021      Reactions   Vicodin [hydrocodone-acetaminophen] Itching      Medication List    STOP taking these medications   doxycycline 100 MG capsule Commonly known as: VIBRAMYCIN   Fiasp FlexTouch 100 UNIT/ML FlexTouch Pen Generic drug: insulin aspart   furosemide 20 MG tablet Commonly known as: LASIX   gabapentin 100 MG capsule Commonly known as: NEURONTIN   hydrochlorothiazide 12.5 MG capsule Commonly known as: MICROZIDE   lisinopril 40 MG tablet Commonly known as: ZESTRIL   rosuvastatin 20 MG tablet Commonly known as: CRESTOR   Santyl ointment Generic drug: collagenase   Toujeo Max SoloStar 300 UNIT/ML Solostar Pen Generic drug: insulin glargine (2 Unit Dial) Replaced by: insulin glargine 100 UNIT/ML injection     TAKE these medications   acetaminophen 325 MG tablet Commonly known as: TYLENOL Take 1-2 tablets (325-650 mg total) by mouth every 6 (six) hours as needed for mild pain (pain score 1-3 or temp > 100.5). What changed:   medication strength  how much to take  reasons to take this   amLODipine 10  MG tablet Commonly known as: NORVASC Take 10 mg by mouth daily.   aspirin 81 MG tablet Take 81 mg by mouth at bedtime.   atenolol 50 MG tablet Commonly known as: TENORMIN Take 50 mg by mouth daily.   cloNIDine 0.1 MG tablet Commonly known as: CATAPRES Take 0.1 mg by mouth 2 (two) times daily.   Fish Oil 500 MG Caps Take 500 mg by mouth at bedtime.   FreeStyle Libre 14 Day Sensor Misc ONE EACH (1 DEVICE DOSE) BY DOES NOT APPLY ROUTE EVERY 14 (FOURTEEN) DAYS.   hydrALAZINE 25 MG tablet Commonly known as: APRESOLINE Take 12.5 mg by mouth 2 (two) times daily.   insulin aspart 100 UNIT/ML injection Commonly known as: novoLOG Inject 4 Units into the skin 3 (three) times daily with meals.   insulin aspart 100 UNIT/ML injection Commonly known as: novoLOG Inject 0-15 Units into the skin 3 (three) times daily with meals.   insulin glargine 100 UNIT/ML injection Commonly known as: LANTUS Inject 0.25 mLs (25 Units total) into the skin at bedtime. Replaces: Toujeo Max SoloStar 300 UNIT/ML Solostar Pen   iron polysaccharides 150 MG capsule Commonly known as: NIFEREX Take 1 capsule (150 mg total) by mouth daily. Start taking on: January 17, 2021   methocarbamol 500 MG tablet Commonly known as: ROBAXIN Take 1 tablet (500 mg total) by mouth every 6 (six) hours as needed for  muscle spasms.   Oxycodone HCl 10 MG Tabs Take 1 tablet (10 mg total) by mouth every 4 (four) hours as needed for severe pain (pain score 7-10).   polyethylene glycol 17 g packet Commonly known as: MIRALAX / GLYCOLAX Take 17 g by mouth daily as needed for mild constipation (PRN for DAYTIME - has separate Senokot-S for bedtime prn).   pregabalin 50 MG capsule Commonly known as: LYRICA Take 50 mg by mouth 3 (three) times daily.   senna-docusate 8.6-50 MG tablet Commonly known as: Senokot-S Take 1 tablet by mouth at bedtime as needed for mild constipation.   sevelamer carbonate 800 MG tablet Commonly  known as: RENVELA Take 1 tablet (800 mg total) by mouth 3 (three) times daily with meals.   sodium chloride 0.65 % Soln nasal spray Commonly known as: OCEAN Place 1 spray into both nostrils daily as needed for congestion.   tamsulosin 0.4 MG Caps capsule Commonly known as: FLOMAX Take 1 capsule (0.4 mg total) by mouth daily after supper.            Discharge Care Instructions  (From admission, onward)         Start     Ordered   01/16/21 0000  Leave dressing on - Keep it clean, dry, and intact until clinic visit        01/16/21 1129          Follow-up Information    Newt Minion, MD In 1 week.   Specialty: Orthopedic Surgery Contact information: [address] [place] [address] [phone]              Allergies  Allergen Reactions  . Vicodin [Hydrocodone-Acetaminophen] Itching    Consultations:  Orthopedics  Nephrology   Procedures/Studies: DG Chest 2 View  Result Date: 01/04/2021 CLINICAL DATA:  Diabetic foot wound EXAM: CHEST - 2 VIEW COMPARISON:  12/28/2014 FINDINGS: Heart size is upper limits of normal. Mild pulmonary vascular congestion. No focal airspace consolidation, pleural effusion, or pneumothorax. IMPRESSION: Mild pulmonary vascular congestion. No focal airspace consolidation. Electronically Signed   By: Davina Poke D.O.   On: 01/04/2021 18:09   US RENAL  Result Date: 01/05/2021 CLINICAL DATA:  Acute kidney injury EXAM: RENAL / URINARY TRACT ULTRASOUND COMPLETE COMPARISON:  None recent FINDINGS: Right Kidney: Renal measurements: 12.2 x 6.7 x 6.4 cm = volume: 272 mL. There is no hydronephrosis. There is increased cortical echogenicity. The cortex is heterogeneous in appearance. Left Kidney: Renal measurements: 13.7 x 6.1 x 5.7 cm = volume: 248 mL. There is no hydronephrosis. There is increased cortical echogenicity. The cortex is heterogeneous. Bladder: The bladder is decompressed. However, there appears to be some bladder wall  thickening. Other: There is a trace amount of free fluid in the upper abdomen. IMPRESSION: 1. No hydronephrosis. 2. Echogenic kidneys bilaterally which can be seen in patients with medical renal disease. 3. Heterogeneous appearance of both kidneys which is a nonspecific finding but can be seen in patients with pyelonephritis. Correlation with urinalysis is recommended. 4. Bladder wall thickening which may be secondary to underdistention versus cystitis versus chronic outlet obstruction. 5. Trace free fluid in the upper abdomen. Electronically Signed   By: Constance Holster M.D.   On: 01/05/2021 15:17   MR FOOT RIGHT W WO CONTRAST  Result Date: 01/05/2021 CLINICAL DATA:  Chronic heel ulcer. EXAM: MRI OF THE RIGHT FOREFOOT WITHOUT AND WITH CONTRAST TECHNIQUE: Multiplanar, multisequence MR imaging of the right foot was performed before and after  the administration of intravenous contrast. CONTRAST:  10 cc Gadavist COMPARISON:  Radiographs, same date. FINDINGS: Surgical changes related to a prior transmetatarsal amputation. There is an open wound involving the plantar aspect of the heel with gas noted in the soft tissues. I do not see a discrete rim enhancing drainable soft tissue abscess. Nonenhancing soft tissue is suggestive of tissue necrosis. Diffuse surrounding cellulitis and myositis. No definite findings for pyomyositis. The plantar fascia is thickened and demonstrates inflammatory changes. No discrete tear/rupture. Abnormal signal intensity and enhancement along the plantar cortex of the calcaneus consistent with early osteomyelitis. Markedly thickened Achilles tendon consistent with chronic tendinopathy. No findings suspicious for septic arthritis. IMPRESSION: 1. Open wound involving the plantar aspect of the heel with gas noted in the soft tissues. Nonenhancing soft tissue is suggestive of tissue necrosis. No discrete rim enhancing drainable soft tissue abscess. 2. Abnormal signal intensity and  enhancement along the plantar cortex of the calcaneus consistent with early osteomyelitis. 3. Diffuse cellulitis and myositis. 4. Markedly thickened Achilles tendon consistent with chronic tendinopathy. Electronically Signed   By: Marijo Sanes M.D.   On: 01/05/2021 08:00   DG CHEST PORT 1 VIEW  Result Date: 01/10/2021 CLINICAL DATA:  65 year old male with shortness of breath EXAM: PORTABLE CHEST 1 VIEW COMPARISON:  01/04/2021, 12/28/2014 FINDINGS: Cardiomediastinal silhouette unchanged in size and contour. No pneumothorax. No pleural effusion. Coarsened interstitial markings similar to the prior plain film. Interlobular septal thickening bilaterally is more pronounced than the plain film of 2016. No new confluent airspace disease. No displaced fracture IMPRESSION: Persisting interlobular septal thickening from the prior plain film, new from the remote comparison 12/28/2014. This is most suggestive of persisting pulmonary edema although atypical infection could have this appearance. Electronically Signed   By: Corrie Mckusick D.O.   On: 01/10/2021 10:59   DG Foot Complete Right  Result Date: 01/06/2021 CLINICAL DATA:  Postop. Incision and drainage with bone biopsy and placement of antibiotic beads. EXAM: RIGHT FOOT COMPLETE - 3+ VIEW COMPARISON:  Radiograph and MRI 01/04/2021 FINDINGS: Interval placement of antibiotic beads subjacent to the calcaneus with overlying skin staples. Patient has prior transmetatarsal amputation with smooth resection margin. IMPRESSION: Interval placement of antibiotic beads subjacent to the calcaneus. Electronically Signed   By: Keith Rake M.D.   On: 01/06/2021 21:57   DG Foot Complete Right  Result Date: 01/05/2021 Please see detailed radiograph report in office note.  DG Foot Complete Right  Result Date: 01/04/2021 CLINICAL DATA:  Nonhealing foot wound EXAM: RIGHT FOOT COMPLETE - 3+ VIEW COMPARISON:  07/03/2019 FINDINGS: Patient is status post transmetatarsal  amputation of the first-fifth rays. Resection margins appear preserved without evidence of a new erosion. There is ulceration plantar aspect of the heel underlying the calcaneal tuberosity. Cortical irregularity of the plantar surface of the posterior calcaneus near the plantar fascia attachment is suspicious for acute osteomyelitis. Air within the soft tissues underlying the site of ulceration suspicious for sinus tract. Vascular calcifications are present. IMPRESSION: 1. Cortical irregularity of the plantar surface of the posterior calcaneus is suspicious for acute osteomyelitis. 2. Plantar foot ulceration with suspected sinus tract extending to the underlying calcaneus. Electronically Signed   By: Davina Poke D.O.   On: 01/04/2021 18:08   ECHOCARDIOGRAM COMPLETE  Result Date: 01/10/2021    ECHOCARDIOGRAM REPORT   Patient Name:   Fernando Anthony Date of Exam: 01/10/2021 Medical Rec #:  [id number]     Height:       74.0 in  Accession #:    [id number]    Weight:       224.6 lb Date of Birth:  November 17, 1956     BSA:          2.284 m Patient Age:    25 years      BP:           152/71 mmHg Patient Gender: M             HR:           63 bpm. Exam Location:  Inpatient Procedure: 2D Echo, Cardiac Doppler and Color Doppler Indications:     Pre-op examination  History:         Patient has no prior history of Echocardiogram examinations.                  Signs/Symptoms:Bacteremia.  Sonographer:     [name] RDCS Referring Phys:  [id number] Barb Merino Diagnosing Phys: Oswaldo Milian MD IMPRESSIONS  1. Left ventricular ejection fraction, by estimation, is 55 to 60%. The left ventricle has normal function. The left ventricle has no regional wall motion abnormalities. There is mild left ventricular hypertrophy. Left ventricular diastolic parameters are indeterminate.  2. Right ventricular systolic function is normal. The right ventricular size is normal. There is moderately elevated pulmonary artery systolic pressure.  The estimated right ventricular systolic pressure is [id number] mmHg.  3. Left atrial size was mildly dilated.  4. Right atrial size was mildly dilated.  5. The mitral valve is normal in structure. No evidence of mitral valve regurgitation.  6. The aortic valve was not well visualized. Aortic valve regurgitation is not visualized. No aortic stenosis is present.  7. The inferior vena cava is dilated in size with <50% respiratory variability, suggesting right atrial pressure of 15 mmHg. FINDINGS  Left Ventricle: Left ventricular ejection fraction, by estimation, is 55 to 60%. The left ventricle has normal function. The left ventricle has no regional wall motion abnormalities. The left ventricular internal cavity size was normal in size. There is  mild left ventricular hypertrophy. Left ventricular diastolic parameters are indeterminate. Right Ventricle: The right ventricular size is normal. No increase in right ventricular wall thickness. Right ventricular systolic function is normal. There is moderately elevated pulmonary artery systolic pressure. The tricuspid regurgitant velocity is 2.87 m/s, and with an assumed right atrial pressure of 15 mmHg, the estimated right ventricular systolic pressure is [id number] mmHg. Left Atrium: Left atrial size was mildly dilated. Right Atrium: Right atrial size was mildly dilated. Pericardium: There is no evidence of pericardial effusion. Mitral Valve: The mitral valve is normal in structure. No evidence of mitral valve regurgitation. Tricuspid Valve: The tricuspid valve is normal in structure. Tricuspid valve regurgitation is trivial. Aortic Valve: The aortic valve was not well visualized. Aortic valve regurgitation is not visualized. No aortic stenosis is present. Aortic valve mean gradient measures 5.0 mmHg. Aortic valve peak gradient measures 9.9 mmHg. Aortic valve area, by VTI measures 2.98 cm. Pulmonic Valve: The pulmonic valve was not well visualized. Pulmonic valve regurgitation is  not visualized. Aorta: The aortic root is normal in size and structure. Venous: The inferior vena cava is dilated in size with less than 50% respiratory variability, suggesting right atrial pressure of 15 mmHg. IAS/Shunts: No atrial level shunt detected by color flow Doppler.  LEFT VENTRICLE PLAX 2D LVIDd:         4.90 cm     Diastology LVIDs:  3.60 cm     LV e' medial:    8.27 cm/s LV PW:         1.20 cm     LV E/e' medial:  16.8 LV IVS:        1.00 cm     LV e' lateral:   9.14 cm/s LVOT diam:     2.20 cm     LV E/e' lateral: 15.2 LV SV:         89 LV SV Index:   39 LVOT Area:     3.80 cm  LV Volumes (MOD) LV vol d, MOD A4C: 96.3 ml LV vol s, MOD A4C: 42.7 ml LV SV MOD A4C:     96.3 ml RIGHT VENTRICLE          IVC RV Basal diam:  4.00 cm  IVC diam: 2.50 cm LEFT ATRIUM             Index       RIGHT ATRIUM           Index LA diam:        4.60 cm 2.01 cm/m  RA Area:     23.40 cm LA Vol (A2C):   71.8 ml 31.44 ml/m RA Volume:   85.10 ml  37.27 ml/m LA Vol (A4C):   87.9 ml 38.49 ml/m LA Biplane Vol: 87.1 ml 38.14 ml/m  AORTIC VALVE AV Area (Vmax):    2.69 cm AV Area (Vmean):   2.88 cm AV Area (VTI):     2.98 cm AV Vmax:           157.00 cm/s AV Vmean:          99.800 cm/s AV VTI:            0.297 m AV Peak Grad:      9.9 mmHg AV Mean Grad:      5.0 mmHg LVOT Vmax:         111.00 cm/s LVOT Vmean:        75.700 cm/s LVOT VTI:          0.233 m LVOT/AV VTI ratio: 0.78  AORTA Ao Root diam: 3.50 cm MITRAL VALVE                TRICUSPID VALVE MV Area (PHT): 5.13 cm     TR Peak grad:   32.9 mmHg MV Decel Time: 148 msec     TR Vmax:        287.00 cm/s MV E velocity: 139.00 cm/s MV A velocity: 78.20 cm/s   SHUNTS MV E/A ratio:  1.78         Systemic VTI:  0.23 m                             Systemic Diam: 2.20 cm Oswaldo Milian MD Electronically signed by Oswaldo Milian MD Signature Date/Time: 01/10/2021/3:12:27 PM    Final (Updated)    US ABDOMEN LIMITED RUQ (LIVER/GB)  Result Date:  01/07/2021 CLINICAL DATA:  Elevated liver enzymes EXAM: ULTRASOUND ABDOMEN LIMITED RIGHT UPPER QUADRANT COMPARISON:  Abdominal ultrasound December 04, 2018 FINDINGS: Gallbladder: Gallbladder is somewhat contracted. Gallbladder wall is borderline thickened without gallbladder wall edema. No gallstones are evident. No pericholecystic fluid. No sonographic Murphy sign noted by sonographer. Common bile duct: Diameter: 3 mm. No intrahepatic or extrahepatic biliary duct dilatation. Liver: No focal lesion identified. Within normal limits in parenchymal echogenicity. Portal vein is  patent on color Doppler imaging with normal direction of blood flow towards the liver. Other: Mild ascites noted.  There is a right pleural effusion. IMPRESSION: 1. Gallbladder appears contracted with gallbladder wall borderline thickened. There is ascites which could account for gallbladder wall thickening. No gallstones evident. Note that acalculus cholecystitis potentially could present in this manner. This finding may warrant nuclear medicine hepatobiliary gene study to assess for cystic duct patency. 2.  Mild ascites. 3.  Right pleural effusion noted. Electronically Signed   By: Lowella Grip III M.D.   On: 01/07/2021 19:59    (Echo, Carotid, EGD, Colonoscopy, ERCP)    Subjective: Patient seen and examined.  Wife at the bedside.  No overnight events.  Still has moderate pain but he has been managing it with oral oxycodone since yesterday evening. Still has scrotal edema but slightly improved.  He is looking forward to go to rehab.   Discharge Exam: Vitals:   01/16/21 0458 01/16/21 0738  BP: (!) 143/68 135/66  Pulse: (!) 54 (!) 51  Resp: 17 18  Temp: 98.3 F (36.8 C) 98.2 F (36.8 C)  SpO2: 96% 98%   Vitals:   01/15/21 1532 01/15/21 1947 01/16/21 0458 01/16/21 0738  BP: (!) 161/78 (!) 147/67 (!) 143/68 135/66  Pulse: 60 (!) 57 (!) 54 (!) 51  Resp: '17 15 17 18  '$ Temp: 98.5 F (36.9 C) 98.4 F (36.9 C) 98.3 F  (36.8 C) 98.2 F (36.8 C)  TempSrc: Oral Oral Oral Oral  SpO2: 97% 96% 96% 98%  Weight:      Height:        General: Pt is alert, awake, not in acute distress Appropriately mildly anxious.  Not in any distress. Cardiovascular: RRR, S1/S2 +, no rubs, no gallops Respiratory: CTA bilaterally, no wheezing, no rhonchi Abdominal: Soft, NT, ND, bowel sounds +.  No rigidity or tenderness.  Murphy sign negative. Extremities:  Patient has large scrotal edema, less tense than before.  Foley catheter with clear urine. Bilateral leg with 2+ edema. Right below-knee amputation stump fitted with wound VAC, dressing not removed.  Wound VAC with no fluid in the canister.    The results of significant diagnostics from this hospitalization (including imaging, microbiology, ancillary and laboratory) are listed below for reference.     Microbiology: Recent Results (from the past 240 hour(s))  Aerobic/Anaerobic Culture w Gram Stain (surgical/deep wound)     Status: None   Collection Time: 01/06/21  8:46 PM   Specimen: Abscess  Result Value Ref Range Status   Specimen Description   Final    ABSCESS RIGHT HEEL Performed at [facility] [address] [place], [place] [address]    Special Requests   Final    NONE Performed at [facility], [place] [address], [place], [place] [address]    Gram Stain NO WBC SEEN RARE GRAM POSITIVE COCCI   Final   Culture   Final    No growth aerobically or anaerobically. Performed at [facility], [place] [address], [place],  [address]    Report Status 01/12/2021 FINAL  Final  Culture, blood (routine x 2)     Status: None (Preliminary result)   Collection Time: 01/11/21  6:55 PM   Specimen: BLOOD LEFT HAND  Result Value Ref Range Status   Specimen Description BLOOD LEFT HAND  Final   Special Requests   Final    BOTTLES DRAWN AEROBIC ONLY Blood Culture adequate volume  Culture   Final    NO GROWTH 4  DAYS Performed at [facility], [place] [address] [place], [place] [address]    Report Status PENDING  Incomplete  Culture, blood (routine x 2)     Status: None (Preliminary result)   Collection Time: 01/11/21  6:55 PM   Specimen: BLOOD LEFT HAND  Result Value Ref Range Status   Specimen Description BLOOD LEFT HAND  Final   Special Requests   Final    BOTTLES DRAWN AEROBIC ONLY Blood Culture adequate volume   Culture   Final    NO GROWTH 4 DAYS Performed at [facility], [place] [address], [place],  [address]    Report Status PENDING  Incomplete     Labs: BNP (last 3 results) Recent Labs    01/04/21 1604  BNP [id number]*   Basic Metabolic Panel: Recent Labs  Lab 01/10/21 0306 01/11/21 0306 01/12/21 0335 01/13/21 0242 01/14/21 0747 01/15/21 0155 01/16/21 0153  NA 133* 133* 130* 131* 134* 134* 132*  K 4.3 4.2 4.6 4.3 4.6 4.7 4.2  CL 106 107 103 104 107 105 105  CO2 15* 16* 13* 16* 16* 18* 16*  GLUCOSE 143* 154* 354* 173* 64* 108* 101*  BUN 62* 63* 64* 67* 71* 70* 72*  CREATININE 5.77* 5.96* 5.86* 5.78* 5.98* 5.86* 5.76*  CALCIUM 7.9* 7.9* 7.9* 7.7* 7.9* 8.1* 7.8*  MG 1.7 1.6*  --  1.8  --   --   --   PHOS 5.8* 6.1*  --  6.7*  --   --  8.0*   Liver Function Tests: Recent Labs  Lab 01/12/21 0335 01/13/21 0242 01/14/21 0747 01/15/21 0155 01/16/21 0153  AST 98* 135* 147* 160* 151*  ALT 120* 114* 133* 141* 156*  ALKPHOS 716* 668* 688* 701* 681*  BILITOT 3.8* 2.8* 3.6* 3.5* 3.5*  PROT 5.8* 5.4* 5.6* 5.8* 5.6*  ALBUMIN 1.5* 1.5* 1.5* 1.5* 1.5*   No results for input(s): LIPASE, AMYLASE in the last 168 hours. No results for input(s): AMMONIA in the last 168 hours. CBC: Recent Labs  Lab 01/11/21 0306 01/13/21 0242 01/14/21 0747 01/15/21 0155 01/16/21 0153  WBC 11.8* 13.4* 12.3* 10.3 9.4  NEUTROABS 8.1* 10.1* 8.9* 7.5 6.6  HGB 9.2* 8.2* 8.3* 8.0* 7.6*  HCT 26.5* 23.8* 24.1* 23.1* 20.9*  MCV 88.0 88.5 87.3 88.2 87.1  PLT 215 227 248 230 222    Cardiac Enzymes: No results for input(s): CKTOTAL, CKMB, CKMBINDEX, TROPONINI in the last 168 hours. BNP: Invalid input(s): POCBNP CBG: Recent Labs  Lab 01/15/21 1217 01/15/21 1249 01/15/21 1643 01/15/21 1947 01/16/21 0627  GLUCAP 69* 104* 209* 211* 72   D-Dimer No results for input(s): DDIMER in the last 72 hours. Hgb A1c No results for input(s): HGBA1C in the last 72 hours. Lipid Profile No results for input(s): CHOL, HDL, LDLCALC, TRIG, CHOLHDL, LDLDIRECT in the last 72 hours. Thyroid function studies No results for input(s): TSH, T4TOTAL, T3FREE, THYROIDAB in the last 72 hours.  Invalid input(s): FREET3 Anemia work up No results for input(s): VITAMINB12, FOLATE, FERRITIN, TIBC, IRON, RETICCTPCT in the last 72 hours. Urinalysis    Component Value Date/Time   COLORURINE YELLOW 01/07/2021 1209   APPEARANCEUR CLEAR 01/07/2021 1209   LABSPEC 1.009 01/07/2021 1209   PHURINE 6.0 01/07/2021 1209   GLUCOSEU >=500 (A) 01/07/2021 1209   HGBUR SMALL (A) 01/07/2021 1209   BILIRUBINUR NEGATIVE 01/07/2021 1209   KETONESUR 5 (A) 01/07/2021 1209   PROTEINUR  100 (A) 01/07/2021 1209   NITRITE NEGATIVE 01/07/2021 1209   LEUKOCYTESUR NEGATIVE 01/07/2021 1209   Sepsis Labs Invalid input(s): PROCALCITONIN,  WBC,  LACTICIDVEN Microbiology Recent Results (from the past 240 hour(s))  Aerobic/Anaerobic Culture w Gram Stain (surgical/deep wound)     Status: None   Collection Time: 01/06/21  8:46 PM   Specimen: Abscess  Result Value Ref Range Status   Specimen Description   Final    ABSCESS RIGHT HEEL Performed at [facility] [address], [place], [place] [address]    Special Requests   Final    NONE Performed at [facility], [place] [address], [place], [place] [address]    Gram Stain NO WBC SEEN RARE GRAM POSITIVE COCCI   Final   Culture   Final    No growth aerobically or anaerobically. Performed at [facility], [place]  [address], [place], [place] [address]    Report Status 01/12/2021 FINAL  Final  Culture, blood (routine x 2)     Status: None (Preliminary result)   Collection Time: 01/11/21  6:55 PM   Specimen: BLOOD LEFT HAND  Result Value Ref Range Status   Specimen Description BLOOD LEFT HAND  Final   Special Requests   Final    BOTTLES DRAWN AEROBIC ONLY Blood Culture adequate volume   Culture   Final    NO GROWTH 4 DAYS Performed at [facility], [place] [address], [place], [place] [address]    Report Status PENDING  Incomplete  Culture, blood (routine x 2)     Status: None (Preliminary result)   Collection Time: 01/11/21  6:55 PM   Specimen: BLOOD LEFT HAND  Result Value Ref Range Status   Specimen Description BLOOD LEFT HAND  Final   Special Requests   Final    BOTTLES DRAWN AEROBIC ONLY Blood Culture adequate volume   Culture   Final    NO GROWTH 4 DAYS Performed at [facility], [place] [address] [place], [place] [address]    Report Status PENDING  Incomplete     Time coordinating discharge: 40 minutes  SIGNED:   Barb Merino, MD  [facility] 01/16/2021, 11:31 AM

## 2021-01-16 NOTE — Care Management (Addendum)
Patient to discharge to [place] RN

## 2021-01-17 DIAGNOSIS — Z89511 Acquired absence of right leg below knee: Secondary | ICD-10-CM

## 2021-01-17 LAB — GLUCOSE, CAPILLARY
Glucose-Capillary: 139 mg/dL — ABNORMAL HIGH (ref 70–99)
Glucose-Capillary: 155 mg/dL — ABNORMAL HIGH (ref 70–99)
Glucose-Capillary: 127 mg/dL — ABNORMAL HIGH (ref 70–99)
Glucose-Capillary: 65 mg/dL — ABNORMAL LOW (ref 70–99)
Glucose-Capillary: 81 mg/dL (ref 70–99)
Glucose-Capillary: 143 mg/dL — ABNORMAL HIGH (ref 70–99)

## 2021-01-17 LAB — TYPE AND SCREEN
ABO/RH(D): O POS
Antibody Screen: NEGATIVE
Unit division: 0

## 2021-01-17 LAB — BPAM RBC
Blood Product Expiration Date: 202203312359
ISSUE DATE / TIME: 202202261433
Unit Type and Rh: 5100

## 2021-01-17 LAB — RENAL FUNCTION PANEL
Albumin: 1.5 g/dL — ABNORMAL LOW (ref 3.5–5.0)
Anion gap: 11 (ref 5–15)
BUN: 73 mg/dL — ABNORMAL HIGH (ref 8–23)
CO2: 16 mmol/L — ABNORMAL LOW (ref 22–32)
Calcium: 7.9 mg/dL — ABNORMAL LOW (ref 8.9–10.3)
Chloride: 108 mmol/L (ref 98–111)
Creatinine, Ser: 5.64 mg/dL — ABNORMAL HIGH (ref 0.61–1.24)
GFR, Estimated: 11 mL/min — ABNORMAL LOW (ref >60)
Glucose, Bld: 113 mg/dL — ABNORMAL HIGH (ref 70–99)
Phosphorus: 7.6 mg/dL — ABNORMAL HIGH (ref 2.5–4.6)
Potassium: 4.4 mmol/L (ref 3.5–5.1)
Sodium: 135 mmol/L (ref 135–145)

## 2021-01-17 MED ORDER — INSULIN GLARGINE 100 UNIT/ML ~~LOC~~ SOLN
20.0000 [IU] | Freq: Every day | SUBCUTANEOUS | Status: DC
  Administered 2021-01-17 – 2021-01-28 (×11): 20 [IU] via SUBCUTANEOUS
  Filled 2021-01-17 (×13): qty 0.2

## 2021-01-17 MED ORDER — FUROSEMIDE 10 MG/ML IJ SOLN
80.0000 mg | Freq: Once | INTRAMUSCULAR | Status: AC
  Administered 2021-01-17: 80 mg via INTRAVENOUS
  Filled 2021-01-17: qty 8

## 2021-01-17 MED ORDER — CHLORHEXIDINE GLUCONATE CLOTH 2 % EX PADS
6.0000 | MEDICATED_PAD | Freq: Every day | CUTANEOUS | Status: DC
  Administered 2021-01-17 – 2021-01-24 (×8): 6 via TOPICAL

## 2021-01-17 NOTE — Plan of Care (Signed)
  Problem: RH BLADDER ELIMINATION Goal: RH STG MANAGE BLADDER WITH EQUIPMENT WITH ASSISTANCE Description: STG Manage Bladder With Equipment With max Assistance Outcome: Not Progressing; foley cath

## 2021-01-17 NOTE — Plan of Care (Signed)
  Problem: RH Balance Goal: LTG: Patient will maintain dynamic sitting balance (OT) Description: LTG:  Patient will maintain dynamic sitting balance with assistance during activities of daily living (OT) Flowsheets (Taken 01/17/2021 1357) LTG: Pt will maintain dynamic sitting balance during ADLs with: Independent Goal: LTG Patient will maintain dynamic standing with ADLs (OT) Description: LTG:  Patient will maintain dynamic standing balance with assist during activities of daily living (OT)  Flowsheets (Taken 01/17/2021 1357) LTG: Pt will maintain dynamic standing balance during ADLs with: Supervision/Verbal cueing   Problem: Sit to Stand Goal: LTG:  Patient will perform sit to stand in prep for activites of daily living with assistance level (OT) Description: LTG:  Patient will perform sit to stand in prep for activites of daily living with assistance level (OT) Flowsheets (Taken 01/17/2021 1357) LTG: PT will perform sit to stand in prep for activites of daily living with assistance level: Independent with assistive device   Problem: RH Grooming Goal: LTG Patient will perform grooming w/assist,cues/equip (OT) Description: LTG: Patient will perform grooming with assist, with/without cues using equipment (OT) Flowsheets (Taken 01/17/2021 1357) LTG: Pt will perform grooming with assistance level of: Set up assist    Problem: RH Bathing Goal: LTG Patient will bathe all body parts with assist levels (OT) Description: LTG: Patient will bathe all body parts with assist levels (OT) Flowsheets (Taken 01/17/2021 1357) LTG: Pt will perform bathing with assistance level/cueing: Supervision/Verbal cueing   Problem: RH Dressing Goal: LTG Patient will perform upper body dressing (OT) Description: LTG Patient will perform upper body dressing with assist, with/without cues (OT). Flowsheets (Taken 01/17/2021 1357) LTG: Pt will perform upper body dressing with assistance level of: Set up assist Goal: LTG  Patient will perform lower body dressing w/assist (OT) Description: LTG: Patient will perform lower body dressing with assist, with/without cues in positioning using equipment (OT) Flowsheets (Taken 01/17/2021 1357) LTG: Pt will perform lower body dressing with assistance level of: Minimal Assistance - Patient > 75%   Problem: RH Toileting Goal: LTG Patient will perform toileting task (3/3 steps) with assistance level (OT) Description: LTG: Patient will perform toileting task (3/3 steps) with assistance level (OT)  Flowsheets (Taken 01/17/2021 1357) LTG: Pt will perform toileting task (3/3 steps) with assistance level: Independent with assistive device   Problem: RH Toilet Transfers Goal: LTG Patient will perform toilet transfers w/assist (OT) Description: LTG: Patient will perform toilet transfers with assist, with/without cues using equipment (OT) Flowsheets (Taken 01/17/2021 1357) LTG: Pt will perform toilet transfers with assistance level of: Independent with assistive device   Problem: RH Tub/Shower Transfers Goal: LTG Patient will perform tub/shower transfers w/assist (OT) Description: LTG: Patient will perform tub/shower transfers with assist, with/without cues using equipment (OT) Flowsheets (Taken 01/17/2021 1357) LTG: Pt will perform tub/shower stall transfers with assistance level of: Supervision/Verbal cueing

## 2021-01-17 NOTE — Progress Notes (Addendum)
Pt wearing TED hose. Pt stable at this time. RN will continue to monitor. Blood sugar 143 no orders for coverage.

## 2021-01-17 NOTE — Progress Notes (Signed)
Inpatient Rehabilitation Medication Review by a Pharmacist  A complete drug regimen review was completed for this patient to identify any potential clinically significant medication issues.  Clinically significant medication issues were identified:  yes   Type of Medication Issue Identified Description of Issue Urgent (address now) Non-Urgent (address on AM team rounds) Plan Plan Accepted by Provider? (Yes / No / Pending AM Rounds)  Drug Interaction(s) (clinically significant)       Duplicate Therapy       Allergy       No Medication Administration End Date       Incorrect Dose  Hydralazine 12.5 mg BID ordered at discharge, currently receiving 50 mg BID in CIR, however patient received 50 mg BID while inpatient. BP ok.  Non-urgent Monitor BP, adjust dose as needed.    Additional Drug Therapy Needed  Lantus 25 units daily ordered at discharge (patient received 28 units daily while inpatient, last dose on 2/25 PM). Lantus is not currently ordered. BG <180 currently.  Non-urgent Secure chat sent to provider to resume Lantus as appropriate. Pending response  Other         Name of provider notified for urgent issues identified: Dr. Dagoberto Ligas  Provider Method of Notification: Secure chat  Pharmacist comments: Pending response.   Time spent performing this drug regimen review (minutes):  15 minutes   Rebbeca Paul, PharmD PGY1 Pharmacy Resident 01/17/2021 9:18 AM  Please check [email or web address] for unit-specific pharmacy phone numbers.

## 2021-01-17 NOTE — Progress Notes (Signed)
PROGRESS NOTE   Subjective/Complaints:  Slept better with trazodone last night- no side effects so far from Duloxetine.   Feeling a little better- like scrotum is a little better- also, pain was better until did therapy- didn't take pain meds before therapy.    ROS:  Pt denies SOB, abd pain, CP, N/V/C/D, and vision changes   Objective:   No results found. Recent Labs    01/16/21 0153 01/16/21 1859  WBC 9.4 11.3*  HGB 7.6* 9.4*  HCT 20.9* 27.1*  PLT 222 232   Recent Labs    01/16/21 0153 01/17/21 1217  NA 132* 135  K 4.2 4.4  CL 105 108  CO2 16* 16*  GLUCOSE 101* 113*  BUN 72* 73*  CREATININE 5.76* 5.64*  CALCIUM 7.8* 7.9*    Intake/Output Summary (Last 24 hours) at 01/17/2021 1614 Last data filed at 01/17/2021 1435 Gross per 24 hour  Intake 475 ml  Output 2125 ml  Net -1650 ml        Physical Exam: Vital Signs Blood pressure 127/65, pulse (!) 51, temperature 98.4 F (36.9 C), temperature source Oral, resp. rate 17, height '6\' 2"'$  (1.88 m), weight 120.1 kg, SpO2 97 %.  Physical Exam Vitals and nursing note reviewed. Nursing notes reviewed and d/w pt with nurse.   General: awake, alert, appropriate, NAD- sitting up in bed; nurse at bedside HENT: conjugate gaze; oropharynx moist CV: regular rate; no JVD Pulmonary: CTA B/L; no W/R/R- good air movement GI: soft, NT, ND, (+)BS Psychiatric: appropriate- less frustrated today Neurological: Ox3 Genitourinary:    Comments: has foley, but scrotum is smaller- medium grapefruit with a few more normal wrinkles of scrotum.  Musculoskeletal:     Cervical back: Normal range of motion. No rigidity.     Comments: UEs 5/5 in biceps, triceps, WE, grip and finger abd B/L LLE- HF 4/5, KE, 4/5, DF and PF 5-/5 RLE- HF 3-/5, KE 3-/5 R BKA- with VAC in place with knee immobilizer  Skin:    Comments: Wound VAC in place to right transtibial amputation site. Also  wearing a knee immobilizer VAC has nothing in canister- but providing suction- still in place Mild swelling of forearms and swelling to groin B/L- 3-4+ pitting edema B/L Buttocks look OK Has a few tiny heat rash spots on back  Neuro: Decreased sensation to light touch from mid calf downwards RLE- knee downwards is decreased greatly     Assessment/Plan: 1. Functional deficits which require 3+ hours per day of interdisciplinary therapy in a comprehensive inpatient rehab setting.  Physiatrist is providing close team supervision and 24 hour management of active medical problems listed below.  Physiatrist and rehab team continue to assess barriers to discharge/monitor patient progress toward functional and medical goals  Care Tool:  Bathing              Bathing assist       Upper Body Dressing/Undressing Upper body dressing        Upper body assist      Lower Body Dressing/Undressing Lower body dressing            Lower body assist  Toileting Toileting    Toileting assist Assist for toileting: 2 Helpers     Transfers Chair/bed transfer  Transfers assist     Chair/bed transfer assist level: Moderate Assistance - Patient 50 - 74%     Locomotion Ambulation   Ambulation assist   Ambulation activity did not occur: Safety/medical concerns          Walk 10 feet activity   Assist  Walk 10 feet activity did not occur: Safety/medical concerns        Walk 50 feet activity   Assist Walk 50 feet with 2 turns activity did not occur: Safety/medical concerns         Walk 150 feet activity   Assist Walk 150 feet activity did not occur: Safety/medical concerns         Walk 10 feet on uneven surface  activity   Assist Walk 10 feet on uneven surfaces activity did not occur: Safety/medical concerns         Wheelchair     Assist Will patient use wheelchair at discharge?: Yes Type of Wheelchair: Manual    Wheelchair  assist level: Dependent - Patient 0%      Wheelchair 50 feet with 2 turns activity    Assist        Assist Level: Dependent - Patient 0%   Wheelchair 150 feet activity     Assist      Assist Level: Dependent - Patient 0%   Blood pressure 127/65, pulse (!) 51, temperature 98.4 F (36.9 C), temperature source Oral, resp. rate 17, height '6\' 2"'$  (1.88 m), weight 120.1 kg, SpO2 97 %.  Medical Problem List and Plan: 1.  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  Wound VAC as directed             -patient may  Shower once VAC off- and R BKA covered             -ELOS/Goals: 14-18 days- mod I to supervision 2.  Antithrombotics: -DVT/anticoagulation: SCDs left lower extremity             -antiplatelet therapy: Aspirin 81 mg daily 3. Pain Management: Lyrica 50 mg 3 times daily, Robaxin and oxycodone as needed- add Duloxetine 20 mg QHS for nerve pain  2/27- no side effects so far- con't regimen 4. Mood: Provide emotional support             -antipsychotic agents: N/A 5. Neuropsych: This patient is capable of making decisions on his own behalf. 6. Skin/Wound Care: Routine skin checks 7. Fluids/Electrolytes/Nutrition: Routine in and outs with follow-up chemistries 8.  Acute on chronic anemia.  Continue Niferex.  Follow-up CBC 9.  AKI/ATN.  Follow-up nephrology services.  No current plan for hemodialysis- Current Cr 5.76 10.  Hypertension.  Hydralazine 50 mg twice daily, Norvasc 10 mg daily, clonidine 0.1 mg twice daily, Tenormin 50 mg daily.  Monitor with increased mobility  2/27- BP controlled- is slightly bradycardic- con't regimen for now 11.  Diabetes mellitus with peripheral neuropathy.  Hemoglobin A1c 10.5.  NovoLog 4 units 3 times daily, Lantus insulin 35 units nightly.  Check blood sugars before meals and at bedtime  2/27- BGs 127-160- con't regimen for now-  12.  Hyperlipidemia.  Lovaza 13.  Urinary retention with scrotal edema.   Flomax 0.4 mg daily.  Continue Foley tube for now until scrotal edema improves and then plan voiding trial.  2/27- elevating on hand towel- is working- also , Renal  giving 80 mg IV Lasix x1 today- should also help LE edema/and scrotal edema.  14.  History of tobacco abuse.  Counseling 15. Protein malnutrition- severe- Alb 1.5- cause of edema likely 16. LE edema- severe- don't see Diuretic- likely due to Renal issues- elevating scrotum- con't regimen  2/27- see #13 17. Insomnia- try Trazodone 25-50 mg QHS for sleep   2/27- working- con't regimen     LOS: 1 days A FACE TO FACE EVALUATION WAS PERFORMED  Erna Brossard 01/17/2021, 4:14 PM

## 2021-01-17 NOTE — Evaluation (Signed)
Physical Therapy Assessment and Plan  Patient Details  Name: Fernando Anthony MRN: [id number] Date of Birth: 1956-06-16  PT Diagnosis: Abnormality of gait, Difficulty walking, Edema, Impaired sensation and Pain in residual limb Rehab Potential: Good ELOS: 12-14   Today's Date: 01/17/2021 PT Individual Time: 0800-0915 PT Individual Time Calculation (min): 75 min    Hospital Problem: Principal Problem:   Right below-knee amputee ([place]) Active Problems:   AKI (acute kidney injury) ([place])   Severe protein-calorie malnutrition ([place])   Past Medical History:  Past Medical History:  Diagnosis Date  . Back pain   . Diabetes mellitus   . Headache(784.0)    general  . Hyperlipidemia   . Hypertension   . Osteomyelitis ([place]) 11/2018   RIGHT FOOT   Past Surgical History:  Past Surgical History:  Procedure Laterality Date  . AMPUTATION Right 09/18/2013   Procedure: Amputation Right Great Toe at [facility];  Surgeon: Newt Minion, MD;  Location: [place];  Service: Orthopedics;  Laterality: Right;  Amputation Right Great Toe at MTP  . AMPUTATION Right 12/05/2018   Procedure: PARTIAL AMPUTATION FIRST RAY RIGHT FOOT;  Surgeon: Edrick Kins, DPM;  Location: [place];  Service: Podiatry;  Laterality: Right;  . AMPUTATION Right 01/11/2021   Procedure: AMPUTATION BELOW KNEE;  Surgeon: Newt Minion, MD;  Location: [place];  Service: Orthopedics;  Laterality: Right;  . APPLICATION OF WOUND VAC Right 01/11/2021   Procedure: APPLICATION OF WOUND VAC;  Surgeon: Newt Minion, MD;  Location: [place];  Service: Orthopedics;  Laterality: Right;  . BONE BIOPSY Right 01/06/2021   Procedure: BONE BIOPSY;  Surgeon: Edrick Kins, DPM;  Location: [facility];  Service: Podiatry;  Laterality: Right;  . CARDIAC CATHETERIZATION  ?1990  . INCISION AND DRAINAGE Right 01/06/2021   Procedure: INCISION AND DRAINAGE, ANTIBIOTIC BEAD PLACEMENT;  Surgeon: Edrick Kins, DPM;  Location: [facility];  Service: Podiatry;  Laterality: Right;  .  SPINE SURGERY      Assessment & Plan Clinical Impression:  Fernando Anthony is a 65 year old right-handed male with history of diabetes mellitus, hypertension, hyperlipidemia, tobacco abuse, peripheral vascular disease with partial amputation first ray right foot 12/05/2018 as well as right great toe amputation at MTP 09/18/2013.  Per chart review patient lives with spouse.  1 level home with ramped entrance.  Wife works full-time and is currently taking time off and can work from home temporarily.  Patient independent with assistive device prior to admission modified independent ambulating with rolling walker uses a scooter when out in the community.  Presented 01/04/2021 with right plantar ulceration as well as bouts of malaise and nausea with scrotal swelling.  He had initially been seeing podiatry for his right plantar ulceration.  MRI of right foot showed open wound involving plantar aspect of the heel with gas noted in the soft tissues.  Nonenhancing soft tissue suggestive of tissue necrosis.  No discrete rim-enhancing drainable soft tissue abscess.  Abnormal signal intensity and enhancement along the plantar cortex of the calcaneus consistent with early osteomyelitis.  Admission chemistry sodium 129 potassium 3.4 glucose 239 BUN 56 creatinine 3.94 from baseline 11/29/2019 of 1.01, WBC 25,300, hemoglobin 9.9, BNP 449, sedimentation rate 129, lactic acid 3.0, blood cultures positive enterobacterales, hemoglobin A1c 10.5.  Infectious disease currently consulted for positive blood cultures placed on broad-spectrum antibiotics with follow-up orthopedic service Dr. 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  All current antibiotics have  been discontinued with latest WBC of 10,300.  Nephrology consulted for AKI suspect ATN and renal ultrasound showed no hydronephrosis.  His lisinopril and HCTZ were discontinued with latest creatinine 5.86 and  no current plan for hemodialysis at this time..  Patient noted significant scrotal edema some urinary retention placed on Flomax a Foley catheter tube had been placed.  Acute on chronic anemia remained stable latest hemoglobin 8.0.  Therapy evaluations completed due to patient decreased functional mobility was admitted for a comprehensive rehab program.  Patient transferred to CIR on 01/16/2021 .   Patient currently requires mod with mobility secondary to muscle weakness, decreased cardiorespiratoy endurance and decreased sitting balance and decreased standing balance.  Prior to hospitalization, patient was modified independent  with mobility and lived with Spouse in a House home.  Home access is  Ramped entrance.  Patient will benefit from skilled PT intervention to maximize safe functional mobility, minimize fall risk and decrease caregiver burden for planned discharge home with 24 hour supervision.  Anticipate patient will benefit from follow up [place] at discharge.  PT - End of Session Activity Tolerance: Tolerates 10 - 20 min activity with multiple rests;Tolerates 30+ min activity with multiple rests Endurance Deficit: Yes Endurance Deficit Description: Pt required rest after each transfer. PT Assessment Rehab Potential (ACUTE/IP ONLY): Good PT Barriers to Discharge: Wound Care;Weight bearing restrictions PT Patient demonstrates impairments in the following area(s): Safety;Pain;Motor;Endurance;Edema;Balance;Sensory PT Transfers Functional Problem(s): Bed Mobility;Bed to Chair;Car;Furniture;Floor PT Locomotion Functional Problem(s): Ambulation;Wheelchair Mobility PT Plan PT Intensity: Minimum of 1-2 x/day ,45 to 90 minutes PT Frequency: 5 out of 7 days PT Duration Estimated Length of Stay: 12-14 PT Treatment/Interventions: Ambulation/gait training;Balance/vestibular training;Community reintegration;Pain management;Neuromuscular re-education;Functional mobility training;DME/adaptive equipment  instruction;Patient/family education;Skin care/wound Landscape architect;Therapeutic Activities;Therapeutic Exercise;UE/LE Strength taining/ROM;Wheelchair propulsion/positioning PT Transfers Anticipated Outcome(s): mod I PT Locomotion Anticipated Outcome(s): supervision PT Recommendation Follow Up Recommendations: Home health PT Patient destination: Home Equipment Recommended: Wheelchair (measurements);Wheelchair cushion (measurements) (Pt owns RW, cane, scooter)   PT Evaluation Precautions/Restrictions Precautions Precautions: Fall Precaution Comments: RLE wound vac/ swollen scotum Required Braces or Orthoses: Other Brace Other Brace: R residual limb guard and R shrinker- Pt still not wearing shrinker.  Says it is too painful due to swelling. Encouraged patient to wear shrinker but continues not to. Restrictions Weight Bearing Restrictions: Yes RLE Weight Bearing: Non weight bearing General   Vital Signs Pain Pain Assessment Pain Scale: 0-10 Pain Score: 10-Worst pain ever Pain Type: Acute pain;Surgical pain Pain Location: Leg Pain Orientation: Right Pain Descriptors / Indicators: Aching Pain Frequency: Constant Pain Onset: On-going Pain Intervention(s): Medication (See eMAR) Home Living/Prior Functioning Home Living Available Help at Discharge: Family;Available 24 hours/day Type of Home: House Home Access: Ramped entrance Home Layout: One level  Lives With: Spouse Prior Function Level of Independence: Requires assistive device for independence  Able to Take Stairs?: No 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 Praxis: Intact  Cognition Overall Cognitive Status: Within Functional Limits for tasks assessed Arousal/Alertness: Awake/alert Orientation Level: Oriented X4 Attention: Focused;Sustained Focused Attention: Appears intact Sustained Attention: Appears intact Memory: Appears intact Awareness: Appears  intact Problem Solving: Appears intact Safety/Judgment: Appears intact Sensation Sensation Light Touch: Impaired by gross assessment Proprioception: Impaired by gross assessment Additional Comments: Impaired sensation in medial side of L foot Coordination Gross Motor Movements are Fluid and Coordinated: No Fine Motor Movements are Fluid and Coordinated: Yes Coordination and Movement Description: Impaired due to pain and recent BKA Motor  Motor Motor: Abnormal tone;Abnormal postural alignment  and control Motor - Skilled Clinical Observations: Pt demonstrates weakness in LLE consistent with diabetic neuropathy.   Trunk/Postural Assessment  Cervical Assessment Cervical Assessment: Within Functional Limits Thoracic Assessment Thoracic Assessment: Within Functional Limits Lumbar Assessment Lumbar Assessment: Within Functional Limits Postural Control Postural Control: Deficits on evaluation  Balance Balance Balance Assessed: Yes Static Sitting Balance Static Sitting - Balance Support: Feet supported;No upper extremity supported;Bilateral upper extremity supported Static Sitting - Level of Assistance: 5: Stand by assistance Dynamic Sitting Balance Dynamic Sitting - Balance Support: During functional activity Dynamic Sitting - Level of Assistance: 5: Stand by assistance Dynamic Sitting - Balance Activities: Lateral lean/weight shifting;Forward lean/weight shifting Sitting balance - Comments: good static balance, fair dynamic balance. Static Standing Balance Static Standing - Balance Support: Bilateral upper extremity supported;During functional activity Static Standing - Level of Assistance: 3: Mod assist Dynamic Standing Balance Dynamic Standing - Balance Support: Bilateral upper extremity supported Dynamic Standing - Level of Assistance: 3: Mod assist Extremity Assessment      RLE Assessment RLE Assessment: Exceptions to [facility] General Strength Comments: RLE strength testing  held due to pain. LLE Assessment LLE Assessment: Exceptions to [facility] General Strength Comments: Weakness in L ankle PF/DF LLE Strength Left Hip Flexion: 4+/5 Left Knee Flexion: 4+/5 Left Knee Extension: 3+/5 Left Ankle Dorsiflexion: 3+/5 Left Ankle Plantar Flexion: 3+/5  Care Tool Care Tool Bed Mobility Roll left and right activity   Roll left and right assist level: Minimal Assistance - Patient > 75%    Sit to lying activity   Sit to lying assist level: Minimal Assistance - Patient > 75%    Lying to sitting edge of bed activity   Lying to sitting edge of bed assist level: Minimal Assistance - Patient > 75%     Care Tool Transfers Sit to stand transfer   Sit to stand assist level: Moderate Assistance - Patient 50 - 74%    Chair/bed transfer   Chair/bed transfer assist level: Moderate Assistance - Patient 50 - 74%     Physiological scientist transfer assist level: Moderate Assistance - Patient 50 - 74%      Care Tool Locomotion Ambulation Ambulation activity did not occur: Safety/medical concerns        Walk 10 feet activity Walk 10 feet activity did not occur: Safety/medical concerns       Walk 50 feet with 2 turns activity Walk 50 feet with 2 turns activity did not occur: Safety/medical concerns      Walk 150 feet activity Walk 150 feet activity did not occur: Safety/medical concerns      Walk 10 feet on uneven surfaces activity Walk 10 feet on uneven surfaces activity did not occur: Safety/medical concerns      Stairs Stair activity did not occur: Safety/medical concerns        Walk up/down 1 step activity Walk up/down 1 step or curb (drop down) activity did not occur: Safety/medical concerns     Walk up/down 4 steps activity did not occuR: Safety/medical concerns  Walk up/down 4 steps activity      Walk up/down 12 steps activity Walk up/down 12 steps activity did not occur: Safety/medical concerns      Pick up small objects from  floor Pick up small object from the floor (from standing position) activity did not occur: Safety/medical concerns      Wheelchair Will patient use wheelchair at discharge?: Yes Type of Wheelchair: Manual  Wheelchair assist level: Dependent - Patient 0%    Wheel 50 feet with 2 turns activity   Assist Level: Dependent - Patient 0%  Wheel 150 feet activity   Assist Level: Dependent - Patient 0%    Refer to Care Plan for Long Term Goals  SHORT TERM GOAL WEEK 1 PT Short Term Goal 1 (Week 1): Pt will perform STS with min A and LRAD. PT Short Term Goal 2 (Week 1): Pt will propel wc  x 150 ft with supervision PT Short Term Goal 3 (Week 1): Pt will be independent with bed mobility. PT Short Term Goal 4 (Week 1): Pt will iniate gait training with LRAD.  Recommendations for other services: None   Skilled Therapeutic Intervention  Evaluation completed (see details above and below) with education on PT POC and goals and individual treatment initiated with focus on improving RLE strength and endurance, functional mobility, education on precautions. Pt received in bed and agreeable to evaluation with wound vac and foley catheter in place. Reports pain in the residual limb, 4/10 at rest and 7/10 with mobility. Pt was premedicated, pain addressed with rest breaks and positioning. Dependent donning of limb guard and LLE ted hose and sock. Pt has not been wearing shrinker stating it is painful due to edema. Explained purpose of shrinker, and encouraged pt to begin wearing. Pt required min A for tube management and scooting and use of bed features for supine <>sit EOB. Pt required mod A for STS with RW throughout session with assist to brace RW and power up to stand. Stand pivot transfer mod A with RW throughout session with assist for power up, and balance . Pt transported to gym for energy conservation via WC. Pt performed car transfer with mod A and VCs for technique, safety, and hand placement. During  sensation testing, pt demonstrated impaired sensation and proprioception in his L foot. Discussed importance of skin checks and shoes to protect L foot. Pt returned to bed after session with mod A and RW. RLE limb guard left in place after educating pt on positioning. Pt left semi-reclined in bed with bed alarm active and all needs in reach.  Mobility Bed Mobility Bed Mobility: Rolling Right;Rolling Left;Sit to Supine;Supine to Sit Rolling Right: Minimal Assistance - Patient > 75% Rolling Left: Minimal Assistance - Patient > 75% Supine to Sit: Minimal Assistance - Patient > 75% Sit to Supine: Minimal Assistance - Patient > 75% Transfers Transfers: Sit to Stand;Stand Pivot Transfers;Squat Pivot Transfers;Stand to Sit Sit to Stand: Moderate Assistance - Patient 50-74% Stand to Sit: Moderate Assistance - Patient 50-74% Stand Pivot Transfers: Moderate Assistance - Patient 50 - 74% Stand Pivot Transfer Details: Verbal cues for sequencing;Verbal cues for technique;Verbal cues for precautions/safety;Verbal cues for safe use of DME/AE;Visual cues/gestures for precautions/safety;Visual cues for safe use of DME/AE Stand Pivot Transfer Details (indicate cue type and reason): Instuction of technique and mod A for balance and to power up to stand with RW Squat Pivot Transfers: Moderate Assistance - Patient 50-74% Transfer (Assistive device): Database administrator / Additional Locomotion Stairs: No Wheelchair Mobility Wheelchair Mobility: No   Discharge Criteria: Patient will be discharged from PT if patient refuses treatment 3 consecutive times without medical reason, if treatment goals not met, if there is a change in medical status, if patient makes no progress towards goals or if patient is discharged from hospital.  The above assessment, treatment plan, treatment alternatives and goals were discussed and mutually agreed upon: by  patient  Sharen Counter, SPT 01/17/2021, 1:24 PM

## 2021-01-17 NOTE — Evaluation (Signed)
Occupational Therapy Assessment and Plan  Patient Details  Name: Fernando Anthony MRN: [id number] Date of Birth: [birth year]  OT Diagnosis: abnormal posture, acute pain, muscle weakness (generalized), swelling of limb and new R BKA Rehab Potential:   ELOS: 15-18 days   Today's Date: 01/17/2021 OT Individual Time: 1256-1350 OT Individual Time Calculation (min): 54 min     Hospital Problem: Principal Problem:   Right below-knee amputee ([place]) Active Problems:   AKI (acute kidney injury) ([place])   Severe protein-calorie malnutrition ([place])   Past Medical History:  Past Medical History:  Diagnosis Date  . Back pain   . Diabetes mellitus   . Headache(784.0)    general  . Hyperlipidemia   . Hypertension   . Osteomyelitis ([place]) 11/2018   RIGHT FOOT   Past Surgical History:  Past Surgical History:  Procedure Laterality Date  . AMPUTATION Right 09/18/2013   Procedure: Amputation Right Great Toe at [facility]TP;  Surgeon: Newt Minion, MD;  Location: [place];  Service: Orthopedics;  Laterality: Right;  Amputation Right Great Toe at MTP  . AMPUTATION Right 12/05/2018   Procedure: PARTIAL AMPUTATION FIRST RAY RIGHT FOOT;  Surgeon: Edrick Kins, DPM;  Location: [place];  Service: Podiatry;  Laterality: Right;  . AMPUTATION Right 01/11/2021   Procedure: AMPUTATION BELOW KNEE;  Surgeon: Newt Minion, MD;  Location: [place];  Service: Orthopedics;  Laterality: Right;  . APPLICATION OF WOUND VAC Right 01/11/2021   Procedure: APPLICATION OF WOUND VAC;  Surgeon: Newt Minion, MD;  Location: [place];  Service: Orthopedics;  Laterality: Right;  . BONE BIOPSY Right 01/06/2021   Procedure: BONE BIOPSY;  Surgeon: Edrick Kins, DPM;  Location: [facility];  Service: Podiatry;  Laterality: Right;  . CARDIAC CATHETERIZATION  ?1990  . INCISION AND DRAINAGE Right 01/06/2021   Procedure: INCISION AND DRAINAGE, ANTIBIOTIC BEAD PLACEMENT;  Surgeon: Edrick Kins, DPM;  Location: [facility];  Service: Podiatry;  Laterality: Right;   . SPINE SURGERY      Assessment & Plan Clinical Impression: Fernando Anthony. Common is a 65 year old right-handed male with history of diabetes mellitus, hypertension, hyperlipidemia, tobacco abuse, peripheral vascular disease with partial amputation first ray right foot 12/05/2018 as well as right great toe amputation at [facility]TP 09/18/2013.  Per chart review patient lives with spouse.  1 level home with ramped entrance.  Wife works full-time and is currently taking time off and can work from home temporarily.  Patient independent with assistive device prior to admission modified independent ambulating with rolling walker uses a scooter when out in the community.  Presented 01/04/2021 with right plantar ulceration as well as bouts of malaise and nausea with scrotal swelling.  He had initially been seeing podiatry for his right plantar ulceration.  MRI of right foot showed open wound involving plantar aspect of the heel with gas noted in the soft tissues.  Nonenhancing soft tissue suggestive of tissue necrosis.  No discrete rim-enhancing drainable soft tissue abscess.  Abnormal signal intensity and enhancement along the plantar cortex of the calcaneus consistent with early osteomyelitis.  Admission chemistry sodium 129 potassium 3.4 glucose 239 BUN 56 creatinine 3.94 from baseline 11/29/2019 of 1.01, WBC 25,300, hemoglobin 9.9, BNP 449, sedimentation rate 129, lactic acid 3.0, blood cultures positive enterobacterales, hemoglobin A1c 10.5.  Infectious disease currently consulted for positive blood cultures placed on broad-spectrum antibiotics with follow-up orthopedic service Dr. 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  All  current antibiotics have been discontinued with latest WBC of 10,300.  Nephrology consulted for AKI suspect ATN and renal ultrasound showed no hydronephrosis.  His lisinopril and HCTZ were discontinued with latest creatinine 5.86  and no current plan for hemodialysis at this time..  Patient noted significant scrotal edema some urinary retention placed on Flomax a Foley catheter tube had been placed.  Acute on chronic anemia remained stable latest hemoglobin 8.0.  Therapy evaluations completed due to patient decreased functional mobility was admitted for a comprehensive rehab program.  Patient transferred to CIR on 01/16/2021 .    Patient currently requires max with basic self-care skills secondary to muscle weakness, decreased cardiorespiratoy endurance, impaired timing and sequencing, unbalanced muscle activation and decreased coordination, decreased motor planning and decreased sitting balance, decreased standing balance, decreased postural control and decreased balance strategies.  Prior to hospitalization, patient could complete BADL with modified independent .  Patient will benefit from skilled intervention to decrease level of assist with basic self-care skills and increase independence with basic self-care skills prior to discharge home with care partner.  Anticipate patient will require 24 hour supervision and follow up home health.  OT - End of Session Activity Tolerance: Tolerates 10 - 20 min activity with multiple rests Endurance Deficit: Yes Endurance Deficit Description: multiple rest breaks d/t fatigue and pain during bed level ADL OT Assessment OT Patient demonstrates impairments in the following area(s): Balance;Behavior;Edema;Endurance;Motor;Pain;Safety;Sensory;Skin Integrity OT Basic ADL's Functional Problem(s): Grooming;Bathing;Dressing;Toileting OT Transfers Functional Problem(s): Toilet;Tub/Shower OT Additional Impairment(s): None OT Plan OT Intensity: Minimum of 1-2 x/day, 45 to 90 minutes OT Frequency: 5 out of 7 days OT Duration/Estimated Length of Stay: 15-18 days OT Treatment/Interventions: Balance/vestibular training;Discharge planning;Pain management;Self Care/advanced ADL retraining;Therapeutic  Activities;UE/LE Coordination activities;Visual/perceptual remediation/compensation;Therapeutic Exercise;Skin care/wound managment;Patient/family education;Functional mobility training;Disease mangement/prevention;Cognitive remediation/compensation;Community reintegration;DME/adaptive equipment instruction;Neuromuscular re-education;Psychosocial support;UE/LE Strength taining/ROM;Wheelchair propulsion/positioning;Splinting/orthotics OT Self Feeding Anticipated Outcome(s): no goal OT Basic Self-Care Anticipated Outcome(s): Supervision - Min A OT Toileting Anticipated Outcome(s): Mod I OT Bathroom Transfers Anticipated Outcome(s): Mod I OT Recommendation Recommendations for Other Services: Neuropsych consult Patient destination: Home Follow Up Recommendations: Home health OT Equipment Recommended: Tub/shower bench;3 in 1 bedside comode   OT Evaluation Precautions/Restrictions  Precautions Precautions: Fall Precaution Comments: RLE wound vac/ swollen scotum Required Braces or Orthoses: Other Brace Other Brace: R residual limb guard and R shrinker- Pt still not wearing shrinker.  Says it is too painful due to swelling. Encouraged patient to wear shrinker but continues not to. Restrictions Weight Bearing Restrictions: Yes RLE Weight Bearing: Non weight bearing General OT Amount of Missed Time: 15 Minutes Vital Signs Therapy Vitals Temp: 98.4 F (36.9 C) Temp Source: Oral Pulse Rate: (!) 51 Resp: 17 BP: 127/65 Patient Position (if appropriate): Lying Oxygen Therapy SpO2: 97 % O2 Device: Room Air Pain Pain Assessment Pain Scale: 0-10 Pain Score: 8  Pain Type: Acute pain;Surgical pain Pain Location: Leg Pain Orientation: Right Pain Descriptors / Indicators: Aching Pain Frequency: Constant Pain Onset: On-going Pain Intervention(s): Medication (See eMAR) Home Living/Prior Functioning Home Living Family/patient expects to be discharged to:: Private residence Living  Arrangements: Spouse/significant other Available Help at Discharge: Family Type of Home: House Home Access: Ramped entrance Home Layout: One level Bathroom Shower/Tub: Engineer, manufacturing systems: Standard Bathroom Accessibility: Yes Additional Comments: Wife works full-time, but has taken time off and can work from home temporarily. Will need to go into work approx 2 days / week  Lives With: Spouse Prior Function Level of Independence: Requires assistive device for independence,Independent with  basic ADLs,Independent with transfers  Able to Take Stairs?: No Vocation: Retired Comments: Mod I w/ RW at home, scooter for community Vision Baseline Vision/History: No visual deficits Patient Visual Report: No change from baseline Perception  Perception: Within Functional Limits Praxis Praxis: Intact Cognition Overall Cognitive Status: Within Functional Limits for tasks assessed Arousal/Alertness: Awake/alert Orientation Level: Person;Place;Situation Person: Oriented Place: Oriented Situation: Oriented Year: 2022 Month: February Day of Week: Correct Memory: Appears intact Immediate Memory Recall: Sock;Blue;Bed Memory Recall Sock: Without Cue Memory Recall Blue: Without Cue Memory Recall Bed: With Cue Attention: Focused;Sustained Focused Attention: Appears intact Sustained Attention: Appears intact Awareness: Appears intact Problem Solving: Appears intact Safety/Judgment: Appears intact Sensation Sensation Light Touch: Impaired by gross assessment Proprioception: Impaired by gross assessment Coordination Gross Motor Movements are Fluid and Coordinated: No Fine Motor Movements are Fluid and Coordinated: Yes Coordination and Movement Description: Impaired due to pain and recent BKA Motor  Motor Motor: Abnormal postural alignment and control Motor - Skilled Clinical Observations: guarding and pain limiting RLE, generalized weakness s/p surgery  Trunk/Postural  Assessment  Cervical Assessment Cervical Assessment: Within Functional Limits Thoracic Assessment Thoracic Assessment: Within Functional Limits Lumbar Assessment Lumbar Assessment: Within Functional Limits Postural Control Postural Control: Deficits on evaluation  Balance Balance Balance Assessed: Yes Static Sitting Balance Static Sitting - Balance Support: Feet supported;Bilateral upper extremity supported Static Sitting - Level of Assistance: 5: Stand by assistance Dynamic Sitting Balance Dynamic Sitting - Balance Support: During functional activity;Feet supported Dynamic Sitting - Level of Assistance: 5: Stand by assistance Extremity/Trunk Assessment RUE Assessment RUE Assessment: Within Functional Limits General Strength Comments: generalized weakness present but ROM/strength functional LUE Assessment LUE Assessment: Within Functional Limits General Strength Comments: generalized weakness present but ROM/strength functional  Care Tool Care Tool Self Care Eating    Indep    Oral Care     Set up    Bathing   Body parts bathed by patient: Right arm;Left arm;Chest;Abdomen;Right upper leg;Left upper leg;Face Body parts bathed by helper: Left lower leg;Buttocks;Front perineal area Body parts n/a: Right lower leg Assist Level: Maximal Assistance - Patient 24 - 49%    Upper Body Dressing(including orthotics)   What is the patient wearing?: Pull over shirt   Assist Level: Moderate Assistance - Patient 50 - 74%    Lower Body Dressing (excluding footwear)   What is the patient wearing?: Pants Assist for lower body dressing: Maximal Assistance - Patient 25 - 49%    Putting on/Taking off footwear   What is the patient wearing?: Non-skid slipper socks Assist for footwear: Total Assistance - Patient < 25%       Care Tool Toileting Toileting activity Toileting Activity did not occur (Clothing management and hygiene only): N/A (no void or bm) Assist for toileting:  (not  assessed at eval)     Care Tool Bed Mobility Roll left and right activity   Roll left and right assist level: Minimal Assistance - Patient > 75%    Sit to lying activity   Sit to lying assist level: Minimal Assistance - Patient > 75%    Lying to sitting edge of bed activity    Min A     Care Tool Transfers Sit to stand transfer    Mod A    Chair/bed transfer    Mod A     Toilet transfer   Assist Level:  (declined at eval, not assessed)     Care Tool Cognition Expression of Ideas and Wants Expression of Ideas and  Wants: Without difficulty (complex and basic) - expresses complex messages without difficulty and with speech that is clear and easy to understand   Understanding Verbal and Non-Verbal Content Understanding Verbal and Non-Verbal Content: Understands (complex and basic) - clear comprehension without cues or repetitions   Memory/Recall Ability *first 3 days only Memory/Recall Ability *first 3 days only: Current season;Staff names and faces;That he or she is in a hospital/hospital unit    Refer to Care Plan for [place] 1 OT Short Term Goal 1 (Week 1): Pt will perform BSC/toilet transfer with Min A and LRAD OT Short Term Goal 2 (Week 1): Pt will perform sit > stand in prep for LB ADL with no more than Min A OT Short Term Goal 3 (Week 1): Pt will perform LB dress with AE PRN with no more than Mod A OT Short Term Goal 4 (Week 1): Pt will tolerate wearing shrinker for 2+ hours to decrease edema in residual limb  Recommendations for other services: Neuropsych   Skilled Therapeutic Intervention ADL ADL Eating: Not assessed Grooming: Not assessed Upper Body Bathing: Contact guard Lower Body Bathing: Maximal assistance Upper Body Dressing: Moderate assistance Where Assessed-Upper Body Dressing: Bed level Lower Body Dressing: Maximal assistance Where Assessed-Lower Body Dressing: Bed level Toileting: Not assessed (pt declined) Toilet  Transfer: Not assessed Mobility  Bed Mobility Bed Mobility: Rolling Right;Rolling Left;Sit to Supine;Supine to Sit Rolling Right: Minimal Assistance - Patient > 75% Rolling Left: Minimal Assistance - Patient > 75% Sit to Supine: Minimal Assistance - Patient > 75%   Skilled Intervention:  Pt greeted at time of session/eval on [facility] in room, quickly asking for privacy and to be left alone for a few minutes. Upon hearing a walker being used in room, therapist entered room and found pt on EOB in poor position in partial side lying, having gotten back to bed w/ wife assist. Later in session, patient and wife education on not performing transfers without staff to decrease risk of falls and injury. Verbalized understanding but may need follow up.   Focus of session on bed level ADL, declined bathing but able to simulate at eval. Agreeable to dressing at bed level only, declined standing or sitting EOB for lateral leans. Sit > supine Min A. Scoot up in bed CGA. UB dress bed level Mod A in partial long sitting and Max - total A for shorts to thread Foley and wound vac, reviewed with pt how to thread/remove for later sessions. Rolled L/R Min A to don pants over hips. Note remainder of session focused on limb loss education, importance of knee extension and hip extension for future mobility, discussed future prosthetic, home environment/set up, etc. Brief retrograde massage as well to RLE residual limb. Trialed donning shrinker sock, pt only able to tolerate for approx 1-2 minutes before having removed. Pain limiting and at end of session had to remove KI as well but educated on importance and recommended putting back on later today as soon as he could tolerate. Positioned in bed with knee extended. Pt requesting larger BSC, attempted to locate but unable, will provide when bariatric is available. Pt in bed supine resting, alarm on call bell in reach. Missed 15 minutes of OT d/t pain. RN aware of pain and provided med  pass during session.   Discharge Criteria: Patient will be discharged from OT if patient refuses treatment 3 consecutive times without medical reason, if treatment goals not met, if there is a  change in medical status, if patient makes no progress towards goals or if patient is discharged from hospital.  The above assessment, treatment plan, treatment alternatives and goals were discussed and mutually agreed upon: by patient and by family  Viona Gilmore 01/17/2021, 4:44 PM

## 2021-01-17 NOTE — Progress Notes (Signed)
[facility] Progress Note   Subjective:   Had 1.3 liter UOP over 2/26 since transfer to rehab room on [facility].  He confirms he got the blood yesterday   Review of systems:    Denies shortness of breath at rest but has with exertion Scrotal swelling - seems a little better Denies n/v No cp   Objective Vitals:   01/16/21 1815 01/16/21 2008 01/17/21 0503  BP: (!) 170/80 (!) 152/74 139/68  Pulse: 62 62 (!) 58  Resp: '18 17 16  '$ Temp: 99.1 F (37.3 C) 99.1 F (37.3 C) 99 F (37.2 C)  TempSrc: Oral Oral Oral  SpO2: 95% 95% 95%  Weight: 120.1 kg    Height: '6\' 2"'$  (1.88 m)       Additional Objective Labs: Basic Metabolic Panel: Recent Labs  Lab 01/11/21 0306 01/12/21 0335 01/13/21 0242 01/14/21 0747 01/15/21 0155 01/16/21 0153  NA 133*   < > 131* 134* 134* 132*  K 4.2   < > 4.3 4.6 4.7 4.2  CL 107   < > 104 107 105 105  CO2 16*   < > 16* 16* 18* 16*  GLUCOSE 154*   < > 173* 64* 108* 101*  BUN 63*   < > 67* 71* 70* 72*  CREATININE 5.96*   < > 5.78* 5.98* 5.86* 5.76*  CALCIUM 7.9*   < > 7.7* 7.9* 8.1* 7.8*  PHOS 6.1*  --  6.7*  --   --  8.0*   < > = values in this interval not displayed.   CBC: Recent Labs  Lab 01/13/21 0242 01/14/21 0747 01/15/21 0155 01/16/21 0153 01/16/21 1859  WBC 13.4* 12.3* 10.3 9.4 11.3*  NEUTROABS 10.1* 8.9* 7.5 6.6  --   HGB 8.2* 8.3* 8.0* 7.6* 9.4*  HCT 23.8* 24.1* 23.1* 20.9* 27.1*  MCV 88.5 87.3 88.2 87.1 88.6  PLT 227 248 230 222 232   Blood Culture    Component Value Date/Time   SDES BLOOD LEFT HAND 01/11/2021 1855   SDES BLOOD LEFT HAND 01/11/2021 1855   SPECREQUEST  01/11/2021 1855    BOTTLES DRAWN AEROBIC ONLY Blood Culture adequate volume   SPECREQUEST  01/11/2021 1855    BOTTLES DRAWN AEROBIC ONLY Blood Culture adequate volume   CULT  01/11/2021 1855    NO GROWTH 5 DAYS Performed at [facility], [place] [address] [place], [place] [address]    CULT  01/11/2021 1855    NO GROWTH 5 DAYS Performed at  [facility] [address], [place], [place] [address]    REPTSTATUS 01/16/2021 FINAL 01/11/2021 1855   REPTSTATUS 01/16/2021 FINAL 01/11/2021 1855     Physical Exam  General adult male in bed in no acute distress HEENT normocephalic atraumatic extraocular movements intact sclera anicteric Neck supple trachea midline Lungs clear to auscultation bilaterally normal work of breathing at rest  Heart S1S2 no rub Abdomen soft nontender nondistended Extremities right BKA; left leg 2+ edema; no arm edema Psych normal mood and affect Neuro - alert and oriented x 3 provides hx and follows commands GU scrotal edema; has foley   Medications: . methocarbamol (ROBAXIN) IV     . amLODipine  10 mg Oral Daily  . aspirin EC  81 mg Oral QHS  . atenolol  50 mg Oral Daily  . cloNIDine  0.1 mg Oral BID  . docusate sodium  100 mg Oral BID  . DULoxetine  20 mg Oral QHS  . hydrALAZINE  50 mg Oral BID  . insulin aspart  0-15 Units Subcutaneous TID WC  . insulin aspart  4 Units Subcutaneous TID WC  . insulin glargine  20 Units Subcutaneous Daily  . iron polysaccharides  150 mg Oral Daily  . omega-3 acid ethyl esters  1 g Oral Daily  . pregabalin  50 mg Oral TID  . sevelamer carbonate  800 mg Oral TID WC  . tamsulosin  0.4 mg Oral QPC supper  . traZODone  25-50 mg Oral QHS     Assessment/Plan: 1. AKI: b/l creat from mid 2021 was 1.09.  Here creat 4.0 on admit 2/15, then peaked at 5.96 on 2/21.  Suspect ATN but urinary retention as below.  Renal ultrasound consistent with medical renal disease. Lisinopril, HCTZ dc'd - ordered renal panel today and see CMP is ordered for AM - No indication for RRT at this time.  Essentially stable but quite impaired and has tolerated lasix - lasix 80 mg IV once today  - renal carb modified diet - stopping cymbalta with renal failure  2. 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 Per orthopedics.   3. HTN: note Lisinopril/HCTZ  held. Acceptable control  4. Anemia-  Secondary to chronic illness. Tsat low, holding IV Fe in the setting of active infection. AKI contributing as well.  aranesp 40 mcg once on 2/23.  On oral iron.  PRBC's on 2/26 ordered   5. hyperphosphatemia - renal diet. Started renvela as binder   6. DM - Insulin per primary team.   7. Urinary retention: new per patient the last couple of weeks.  Now on flomax.  Now with foley and continue same.  Assess voiding trial soon - has been on flomax   Claudia Desanctis, MD 01/17/2021  12:14 PM

## 2021-01-17 NOTE — H&P (Signed)
Physical Medicine and Rehabilitation Admission H&P    No chief complaint on file. : HPI: Fernando Anthony. Amorin is a 65 year old right-handed male with history of diabetes mellitus, hypertension, hyperlipidemia, tobacco abuse, peripheral vascular disease with partial amputation first ray right foot 12/05/2018 as well as right great toe amputation at [facility] 09/18/2013.  Per chart review patient lives with spouse.  1 level home with ramped entrance.  Wife works full-time and is currently taking time off and can work from home temporarily.  Patient independent with assistive device prior to admission modified independent ambulating with rolling walker uses a scooter when out in the community.  Presented 01/04/2021 with right plantar ulceration as well as bouts of malaise and nausea with scrotal swelling.  He had initially been seeing podiatry for his right plantar ulceration.  MRI of right foot showed open wound involving plantar aspect of the heel with gas noted in the soft tissues.  Nonenhancing soft tissue suggestive of tissue necrosis.  No discrete rim-enhancing drainable soft tissue abscess.  Abnormal signal intensity and enhancement along the plantar cortex of the calcaneus consistent with early osteomyelitis.  Admission chemistry sodium 129 potassium 3.4 glucose 239 BUN 56 creatinine 3.94 from baseline 11/29/2019 of 1.01, WBC 25,300, hemoglobin 9.9, BNP 449, sedimentation rate 129, lactic acid 3.0, blood cultures positive enterobacterales, hemoglobin A1c 10.5.  Infectious disease currently consulted for positive blood cultures placed on broad-spectrum antibiotics with follow-up orthopedic service Dr. 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  All current antibiotics have been discontinued with latest WBC of 10,300.  Nephrology consulted for AKI suspect ATN and renal ultrasound showed no hydronephrosis.  His lisinopril and HCTZ were  discontinued with latest creatinine 5.86 and no current plan for hemodialysis at this time..  Patient noted significant scrotal edema some urinary retention placed on Flomax a Foley catheter tube had been placed.  Acute on chronic anemia remained stable latest hemoglobin 8.0.  Therapy evaluations completed due to patient decreased functional mobility was admitted for a comprehensive rehab program.  Pain meds works for 4 hours, then it gets worse, so has to stay on top of pain meds.  Mainly having phantom pain- in foot  Also has scrotal swelling- severe.  LBM yesterday- working OK.  Not sleeping due to pain and nursing.    Review of Systems  Constitutional: Positive for malaise/fatigue. Negative for chills and fever.  HENT: Negative for hearing loss.   Eyes: Negative for blurred vision and double vision.  Respiratory: Negative for cough and shortness of breath.   Cardiovascular: Positive for leg swelling. Negative for chest pain.  Gastrointestinal: Positive for constipation and nausea. Negative for vomiting.  Genitourinary: Negative for dysuria, flank pain and hematuria.       Scrotal edema  Musculoskeletal: Positive for joint pain and myalgias.  Skin: Negative for rash.  Neurological: Positive for weakness and headaches.  All other systems reviewed and are negative.  Past Medical History:  Diagnosis Date  . Back pain   . Diabetes mellitus   . Headache(784.0)    general  . Hyperlipidemia   . Hypertension   . Osteomyelitis ([place]) 11/2018   RIGHT FOOT   Past Surgical History:  Procedure Laterality Date  . AMPUTATION Right 09/18/2013   Procedure: Amputation Right Great Toe at [facility];  Surgeon: Newt Minion, MD;  Location: [facility];  Service: Orthopedics;  Laterality: Right;  Amputation Right Great Toe at [facility]  . AMPUTATION Right  12/05/2018   Procedure: PARTIAL AMPUTATION FIRST RAY RIGHT FOOT;  Surgeon: Edrick Kins, DPM;  Location: [facility];  Service: Podiatry;  Laterality: Right;  .  AMPUTATION Right 01/11/2021   Procedure: AMPUTATION BELOW KNEE;  Surgeon: Newt Minion, MD;  Location: [facility];  Service: Orthopedics;  Laterality: Right;  . APPLICATION OF WOUND VAC Right 01/11/2021   Procedure: APPLICATION OF WOUND VAC;  Surgeon: Newt Minion, MD;  Location: [facility];  Service: Orthopedics;  Laterality: Right;  . BONE BIOPSY Right 01/06/2021   Procedure: BONE BIOPSY;  Surgeon: Edrick Kins, DPM;  Location: [facility];  Service: Podiatry;  Laterality: Right;  . CARDIAC CATHETERIZATION  ?1990  . INCISION AND DRAINAGE Right 01/06/2021   Procedure: INCISION AND DRAINAGE, ANTIBIOTIC BEAD PLACEMENT;  Surgeon: Edrick Kins, DPM;  Location: [facility];  Service: Podiatry;  Laterality: Right;  . SPINE SURGERY     Family History  Problem Relation Age of Onset  . Cancer Mother   . Heart disease Father    Social History:  reports that he has been smoking cigarettes. He has a 40.00 pack-year smoking history. He has never used smokeless tobacco. He reports previous alcohol use. He reports previous drug use. Allergies:  Allergies  Allergen Reactions  . Vicodin [Hydrocodone-Acetaminophen] Itching   Medications Prior to Admission  Medication Sig Dispense Refill  . acetaminophen (TYLENOL) 325 MG tablet Take 1-2 tablets (325-650 mg total) by mouth every 6 (six) hours as needed for mild pain (pain score 1-3 or temp > 100.5).    Marland Kitchen amLODipine (NORVASC) 10 MG tablet Take 10 mg by mouth daily.    Marland Kitchen aspirin 81 MG tablet Take 81 mg by mouth at bedtime.     Marland Kitchen atenolol (TENORMIN) 50 MG tablet Take 50 mg by mouth daily.    . cloNIDine (CATAPRES) 0.1 MG tablet Take 0.1 mg by mouth 2 (two) times daily.    . hydrALAZINE (APRESOLINE) 25 MG tablet Take 12.5 mg by mouth 2 (two) times daily.    . hydrochlorothiazide (HYDRODIURIL) 12.5 MG tablet Take 12.5 mg by mouth daily.    . Insulin Aspart, w/Niacinamide, (FIASP) 100 UNIT/ML SOLN Inject 6 Units into the skin 3 (three) times daily.    . insulin glargine, 2  Unit Dial, (TOUJEO MAX SOLOSTAR) 300 UNIT/ML Solostar Pen Inject 40 Units into the skin at bedtime.    . iron polysaccharides (NIFEREX) 150 MG capsule Take 1 capsule (150 mg total) by mouth daily.    Marland Kitchen lisinopril (ZESTRIL) 40 MG tablet Take 40 mg by mouth every evening.    . methocarbamol (ROBAXIN) 500 MG tablet Take 1 tablet (500 mg total) by mouth every 6 (six) hours as needed for muscle spasms.    . Omega-3 Fatty Acids (FISH OIL) 500 MG CAPS Take 500 mg by mouth at bedtime.     Marland Kitchen oxyCODONE 10 MG TABS Take 1 tablet (10 mg total) by mouth every 4 (four) hours as needed for severe pain (pain score 7-10). 30 tablet 0  . polyethylene glycol (MIRALAX / GLYCOLAX) 17 g packet Take 17 g by mouth daily as needed for mild constipation (PRN for DAYTIME - has separate Senokot-S for bedtime prn). 14 each 0  . pregabalin (LYRICA) 50 MG capsule Take 50 mg by mouth 3 (three) times daily.    . rosuvastatin (CRESTOR) 20 MG tablet Take 20 mg by mouth daily.    Marland Kitchen senna-docusate (SENOKOT-S) 8.6-50 MG tablet Take 1 tablet by mouth at  bedtime as needed for mild constipation.    . sevelamer carbonate (RENVELA) 800 MG tablet Take 1 tablet (800 mg total) by mouth 3 (three) times daily with meals.    . sodium chloride (OCEAN) 0.65 % SOLN nasal spray Place 1 spray into both nostrils daily as needed for congestion.    . tamsulosin (FLOMAX) 0.4 MG CAPS capsule Take 1 capsule (0.4 mg total) by mouth daily after supper. 30 capsule     Drug Regimen Review Drug regimen was reviewed and remains appropriate with no significant issues identified  Home: Home Living Family/patient expects to be discharged to:: Private residence Living Arrangements: Spouse/significant other   Functional History:    Functional Status:  Mobility:          ADL:    Cognition: Cognition Orientation Level: Oriented X4    Physical Exam: Blood pressure 139/68, pulse (!) 58, temperature 99 F (37.2 C), temperature source Oral, resp.  rate 16, height '6\' 2"'$  (1.88 m), weight 120.1 kg, SpO2 95 %. Physical Exam Vitals and nursing note reviewed. Exam conducted with a chaperone present.  Constitutional:      Comments: Awake, alert, appropriate, sitting up eating lunch, wife at bedside, NAD  HENT:     Head: Normocephalic and atraumatic.     Right Ear: External ear normal.     Left Ear: External ear normal.     Nose: Nose normal. No congestion.     Mouth/Throat:     Mouth: Mucous membranes are moist.     Pharynx: Oropharynx is clear. No oropharyngeal exudate.  Eyes:     General:        Right eye: No discharge.        Left eye: No discharge.     Extraocular Movements: Extraocular movements intact.     Conjunctiva/sclera: Conjunctivae normal.  Cardiovascular:     Comments: RRR- rate in 60s; no JVD Pulmonary:     Comments: CTA B/L- no W/R/R- good air movement  Abdominal:     Comments: Soft, NT, ND, (+)BS -hypoactive  Genitourinary:    Comments: Patient with significant scrotal edema with Foley catheter tube in place. Foley in place- scrotum size of very large grapefruit Musculoskeletal:     Cervical back: Normal range of motion. No rigidity.     Comments: UEs 5/5 in biceps, triceps, WE, grip and finger abd B/L LLE- HF 4/5, KE, 4/5, DF and PF 5-/5 RLE- HF 3-/5, KE 3-/5 R BKA- with VAC in place with knee immobilizer  Skin:    Comments: Wound VAC in place to right transtibial amputation site.  VAC has nothing in canister- but providing suction Mild swelling of forearms and swelling to groin B/L- 3-4+ pitting edema B/L Buttocks look OK Has a few tiny heat rash spots on back  Neurological:     Comments: Patient is alert in no acute distress.  Oriented x3 and follows commands.  Decreased sensation to light touch from mid calf downwards RLE- knee downwards is decreased greatly  Psychiatric:     Comments: A little frustrated secondary to lack of sleep;      Results for orders placed or performed during the  hospital encounter of 01/16/21 (from the past 48 hour(s))  CBC     Status: Abnormal   Collection Time: 01/16/21  6:59 PM  Result Value Ref Range   WBC 11.3 (H) 4.0 - 10.5 K/uL   RBC 3.06 (L) 4.22 - 5.81 MIL/uL   Hemoglobin 9.4 (L) 13.0 - 17.0  g/dL   HCT 27.1 (L) 39.0 - 52.0 %   MCV 88.6 80.0 - 100.0 fL   MCH 30.7 26.0 - 34.0 pg   MCHC 34.7 30.0 - 36.0 g/dL   RDW 17.7 (H) 11.5 - 15.5 %   Platelets 232 150 - 400 K/uL    Comment: REPEATED TO VERIFY   nRBC 0.0 0.0 - 0.2 %    Comment: Performed at [facility] [address], [place], [place] [address]  Glucose, capillary     Status: Abnormal   Collection Time: 01/16/21  9:02 PM  Result Value Ref Range   Glucose-Capillary 160 (H) 70 - 99 mg/dL    Comment: Glucose reference range applies only to samples taken after fasting for at least 8 hours.  Glucose, capillary     Status: Abnormal   Collection Time: 01/17/21  5:01 AM  Result Value Ref Range   Glucose-Capillary 139 (H) 70 - 99 mg/dL    Comment: Glucose reference range applies only to samples taken after fasting for at least 8 hours.  Glucose, capillary     Status: Abnormal   Collection Time: 01/17/21  6:12 AM  Result Value Ref Range   Glucose-Capillary 155 (H) 70 - 99 mg/dL    Comment: Glucose reference range applies only to samples taken after fasting for at least 8 hours.   No results found.     Medical Problem List and Plan: 1.  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  Wound VAC as directed  -patient may  Shower once VAC off- and R BKA covered  -ELOS/Goals: 14-18 days- mod I to supervision 2.  Antithrombotics: -DVT/anticoagulation: SCDs left lower extremity  -antiplatelet therapy: Aspirin 81 mg daily 3. Pain Management: Lyrica 50 mg 3 times daily, Robaxin and oxycodone as needed- add Duloxetine 20 mg QHS for nerve pain 4. Mood: Provide emotional support  -antipsychotic agents: N/A 5. Neuropsych: This patient is capable of  making decisions on his own behalf. 6. Skin/Wound Care: Routine skin checks 7. Fluids/Electrolytes/Nutrition: Routine in and outs with follow-up chemistries 8.  Acute on chronic anemia.  Continue Niferex.  Follow-up CBC 9.  AKI/ATN.  Follow-up nephrology services.  No current plan for hemodialysis- Current Cr 5.76 10.  Hypertension.  Hydralazine 50 mg twice daily, Norvasc 10 mg daily, clonidine 0.1 mg twice daily, Tenormin 50 mg daily.  Monitor with increased mobility 11.  Diabetes mellitus with peripheral neuropathy.  Hemoglobin A1c 10.5.  NovoLog 4 units 3 times daily, Lantus insulin 35 units nightly.  Check blood sugars before meals and at bedtime 12.  Hyperlipidemia.  Lovaza 13.  Urinary retention with scrotal edema.  Flomax 0.4 mg daily.  Continue Foley tube for now until scrotal edema improves and then plan voiding trial. 14.  History of tobacco abuse.  Counseling 15. Protein malnutrition- severe- Alb 1.5- cause of edema likely 16. LE edema- severe- don't see Diuretic- likely due to Renal issues- elevating scrotum- con't regimen 17. Insomnia- try Trazodone 25-50 mg QHS for sleep    Morley Kos- PA-C 01/17/21   I have personally performed a face to face diagnostic evaluation of this patient and formulated the key components of the plan.  Additionally, I have personally reviewed laboratory data, imaging studies, as well as relevant notes and concur with the physician assistant's documentation above.     [name], MD 01/17/2021

## 2021-01-17 NOTE — Progress Notes (Signed)
Hypoglycemic Event  CBG: 65  Treatment: OJ; meal  Symptoms: none  Follow-up CBG: Time:1751 CBG Result:81  Possible Reasons for Event: maybe medication  Comments/MD notified:followed protocol    Jillyn Ledger

## 2021-01-18 DIAGNOSIS — Z89511 Acquired absence of right leg below knee: Secondary | ICD-10-CM | POA: Diagnosis not present

## 2021-01-18 LAB — COMPREHENSIVE METABOLIC PANEL
ALT: 121 U/L — ABNORMAL HIGH (ref 0–44)
AST: 65 U/L — ABNORMAL HIGH (ref 15–41)
Albumin: 1.5 g/dL — ABNORMAL LOW (ref 3.5–5.0)
Alkaline Phosphatase: 603 U/L — ABNORMAL HIGH (ref 38–126)
Anion gap: 12 (ref 5–15)
BUN: 73 mg/dL — ABNORMAL HIGH (ref 8–23)
CO2: 16 mmol/L — ABNORMAL LOW (ref 22–32)
Calcium: 7.9 mg/dL — ABNORMAL LOW (ref 8.9–10.3)
Chloride: 105 mmol/L (ref 98–111)
Creatinine, Ser: 5.73 mg/dL — ABNORMAL HIGH (ref 0.61–1.24)
GFR, Estimated: 10 mL/min — ABNORMAL LOW (ref >60)
Glucose, Bld: 144 mg/dL — ABNORMAL HIGH (ref 70–99)
Potassium: 4.4 mmol/L (ref 3.5–5.1)
Sodium: 133 mmol/L — ABNORMAL LOW (ref 135–145)
Total Bilirubin: 3.7 mg/dL — ABNORMAL HIGH (ref 0.3–1.2)
Total Protein: 5.5 g/dL — ABNORMAL LOW (ref 6.5–8.1)

## 2021-01-18 LAB — CBC WITH DIFFERENTIAL/PLATELET
Abs Immature Granulocytes: 0.11 10*3/uL — ABNORMAL HIGH (ref 0.00–0.07)
Basophils Absolute: 0.1 10*3/uL (ref 0.0–0.1)
Basophils Relative: 1 %
Eosinophils Absolute: 0.2 10*3/uL (ref 0.0–0.5)
Eosinophils Relative: 2 %
HCT: 22.1 % — ABNORMAL LOW (ref 39.0–52.0)
Hemoglobin: 8 g/dL — ABNORMAL LOW (ref 13.0–17.0)
Immature Granulocytes: 1 %
Lymphocytes Relative: 11 %
Lymphs Abs: 1.1 10*3/uL (ref 0.7–4.0)
MCH: 32.3 pg (ref 26.0–34.0)
MCHC: 36.2 g/dL — ABNORMAL HIGH (ref 30.0–36.0)
MCV: 89.1 fL (ref 80.0–100.0)
Monocytes Absolute: 1 10*3/uL (ref 0.1–1.0)
Monocytes Relative: 10 %
Neutro Abs: 7.9 10*3/uL — ABNORMAL HIGH (ref 1.7–7.7)
Neutrophils Relative %: 75 %
Platelets: 217 10*3/uL (ref 150–400)
RBC: 2.48 MIL/uL — ABNORMAL LOW (ref 4.22–5.81)
RDW: 18.3 % — ABNORMAL HIGH (ref 11.5–15.5)
WBC: 10.2 10*3/uL (ref 4.0–10.5)
nRBC: 0 % (ref 0.0–0.2)

## 2021-01-18 LAB — GLUCOSE, CAPILLARY
Glucose-Capillary: 141 mg/dL — ABNORMAL HIGH (ref 70–99)
Glucose-Capillary: 146 mg/dL — ABNORMAL HIGH (ref 70–99)
Glucose-Capillary: 76 mg/dL (ref 70–99)
Glucose-Capillary: 118 mg/dL — ABNORMAL HIGH (ref 70–99)

## 2021-01-18 MED ORDER — OXYCODONE HCL ER 10 MG PO T12A
10.0000 mg | EXTENDED_RELEASE_TABLET | Freq: Two times a day (BID) | ORAL | Status: DC
  Administered 2021-01-18 – 2021-01-20 (×5): 10 mg via ORAL
  Filled 2021-01-18 (×5): qty 1

## 2021-01-18 NOTE — Progress Notes (Signed)
Physical Therapy Session Note  Patient Details  Name: Fernando Anthony MRN: [id number] Date of Birth: [birth year]  Today's Date: 01/18/2021 PT Individual Time: 1115-1155 and 1400-1450 PT Individual Time Calculation (min): 40 min and 50 min PT Missed Time: 10 minutes PT Missed Time Reason: Pain   Short Term Goals: Week 1:  PT Short Term Goal 1 (Week 1): Pt will perform STS with min A and LRAD. PT Short Term Goal 2 (Week 1): Pt will propel wc  x 150 ft with supervision PT Short Term Goal 3 (Week 1): Pt will be independent with bed mobility. PT Short Term Goal 4 (Week 1): Pt will iniate gait training with LRAD.  Skilled Therapeutic Interventions/Progress Updates:   Treatment Session 1: 1115-1155 40 min  Received pt semi-reclined in bed, pt agreeable to therapy, and reported pain 8/10 in R residual limb (premedicated). Repositioning and rest breaks done to reduce pain levels. NT present to check blood glucose levels. Pt reported getting up in recliner earlier this morning and staying up for >1 hour but reported increased pain and requested to stay in bed during session due to high pain levels. Pt with questions regarding wound vac removal; per RN wound vac to be removed after therapy today. Educated pt on importance of maintaning knee extension to avoid knee flexion contractures and pt performed the following exercises supine in bed with supervision and cues for technique: -SLR 2x12 bilaterally -hip abduction 2x12 bilaterally -single leg bridges 2x10 -LLE SAQ 2x12 -L ankle circles x20 clockwise/counterclockwise -attempted hip adduction, however due to pain from scrotal edema stopped exercise. Pt reported attempting to lie on his side but has not been able to due to pain in scrotum and in RLE and fear of pulling out catheter rolling around in bed. Pt with increased pain with exercises requiring multiple rest breaks and cues for pursed lip breathing throughout session. Discussed home equipment and  options for navigating tight spaces. Pt reports he could take the door of the bathroom off and therapist informed pt of option to remove rims from [place]. Pt scooted to [facility] with supervision and use of bed features. Concluded session with pt semi-reclined in bed, needs within reach, and bed alarm on.   Treatment Session 2: 1400-1450 50 min Received pt semi-reclined in bed reporting pain in R limb was just as bad as it was this morning. Pt stated "I don't know how much I can do" and requested therapist get RN to remove wound vac now rather than after therapy as pt reported the tape was rolling up and irritating the back of his leg. RN notified and present to remove wound vac. Therapist assisted RN with managing R residual limb to remove wound vac. Therapist encouraged pt to actively hold RLE up for isometric quad strengthening during wound vac removal and educated pt on pursed lip breathing technique. Noted pt with minimal bleeding along far L end of incision. Donned shrinker with +2 assist and educated pt/pt's wife on shrinker care instructions including purpose of using this specific shrinker for antimicrobial purposes and improved wound healing. Therapist encouraged getting into recliner but pt stated "hell no I'm not getting into the chair" reporting increased pain from wound vac removal. Pt requested to lay in bed and rest. In pt's room found compression stocking from Dr. Christena Flake for LLE. Doffed non-skid sock and regular ted hose and donned compression stocking from Dr. Christena Flake with total A. Concluded session with pt semi-reclined in bed, needs within reach, and bed alarm  on. Provided pt with ensure drink. 10 minutes missed of skilled physical therapy due to pain.   Therapy Documentation Precautions:  Precautions Precautions: Fall Precaution Comments: RLE wound vac/ swollen scotum Required Braces or Orthoses: Other Brace Other Brace: R residual limb guard and R shrinker- Pt still not wearing shrinker.  Says it  is too painful due to swelling. Encouraged patient to wear shrinker but continues not to. Restrictions Weight Bearing Restrictions: Yes RLE Weight Bearing: Non weight bearing  Therapy/Group: Individual Therapy Alfonse Alpers PT, DPT   01/18/2021, 7:15 AM

## 2021-01-18 NOTE — Progress Notes (Signed)
Inpatient Rehabilitation  Patient information reviewed and entered into eRehab system by Sheritha Louis M. Deriana Vanderhoef, M.A., CCC/SLP, PPS Coordinator.  Information including medical coding, functional ability and quality indicators will be reviewed and updated through discharge.    

## 2021-01-18 NOTE — Progress Notes (Signed)
Patient ID: Fernando Anthony, male   DOB: 12-Dec-1955, 64 y.o.   MRN: [id number]  Discontinued wound vac today per MD order. Patient tolerated procedure fairly well with minimal complaints of pain. Incision is attached with staples, clean and intact. Placed black compression sock over residual limb and explained reasons for sock. Patient stated verbal understanding and continued with therapy.  Dorthula Nettles, RN3, BSN, CBIS, CRRN, [employer]

## 2021-01-18 NOTE — Progress Notes (Signed)
[facility] Individual Statement of Services  Patient Name:  Fernando Anthony  Date:  01/18/2021  Welcome to the [facility].  Our goal is to provide you with an individualized program based on your diagnosis and situation, designed to meet your specific needs.  With this comprehensive rehabilitation program, you will be expected to participate in at least 3 hours of rehabilitation therapies Monday-Friday, with modified therapy programming on the weekends.  Your rehabilitation program will include the following services:  Physical Therapy (PT), Occupational Therapy (OT), 24 hour per day rehabilitation nursing, Neuropsychology, Care Coordinator, Rehabilitation Medicine, Nutrition Services and Pharmacy Services  Weekly team conferences will be held on Wednesday to discuss your progress.  Your Inpatient Rehabilitation Care Coordinator will talk with you frequently to get your input and to update you on team discussions.  Team conferences with you and your family in attendance may also be held.  Expected length of stay:13-15 days  Overall anticipated outcome: supervision with cues  Depending on your progress and recovery, your program may change. Your Inpatient Rehabilitation Care Coordinator will coordinate services and will keep you informed of any changes. Your Inpatient Rehabilitation Care Coordinator's name and contact numbers are listed  below.  The following services may also be recommended but are not provided by the [facility] will be made to provide these services after discharge if needed.  Arrangements include referral to agencies that provide these services.  Your insurance has been verified to be:  [place] Your primary doctor is:  Kip Corrington  Pertinent information will be shared with  your doctor and your insurance company.  Inpatient Rehabilitation Care Coordinator:  Ovidio Kin, [place] or Emilia Beck  Information discussed with and copy given to patient by: Elease Hashimoto, 01/18/2021, 10:43 AM

## 2021-01-18 NOTE — Progress Notes (Signed)
Inpatient Rehabilitation Care Coordinator Assessment and Plan Patient Details  Name: Fernando Anthony MRN: [id number] Date of Birth: [birth year]  Today's Date: 01/18/2021  Hospital Problems: Principal Problem:   Right below-knee amputee [facility]) Active Problems:   AKI (acute kidney injury) ([place])   Severe protein-calorie malnutrition ([place])  Past Medical History:  Past Medical History:  Diagnosis Date  . Back pain   . Diabetes mellitus   . Headache(784.0)    general  . Hyperlipidemia   . Hypertension   . Osteomyelitis ([place]) 11/2018   RIGHT FOOT   Past Surgical History:  Past Surgical History:  Procedure Laterality Date  . AMPUTATION Right 09/18/2013   Procedure: Amputation Right Great Toe at [facility];  Surgeon: Newt Minion, MD;  Location: [place];  Service: Orthopedics;  Laterality: Right;  Amputation Right Great Toe at MTP  . AMPUTATION Right 12/05/2018   Procedure: PARTIAL AMPUTATION FIRST RAY RIGHT FOOT;  Surgeon: Edrick Kins, DPM;  Location: [place];  Service: Podiatry;  Laterality: Right;  . AMPUTATION Right 01/11/2021   Procedure: AMPUTATION BELOW KNEE;  Surgeon: Newt Minion, MD;  Location: [place];  Service: Orthopedics;  Laterality: Right;  . APPLICATION OF WOUND VAC Right 01/11/2021   Procedure: APPLICATION OF WOUND VAC;  Surgeon: Newt Minion, MD;  Location: [place];  Service: Orthopedics;  Laterality: Right;  . BONE BIOPSY Right 01/06/2021   Procedure: BONE BIOPSY;  Surgeon: Edrick Kins, DPM;  Location: [facility];  Service: Podiatry;  Laterality: Right;  . CARDIAC CATHETERIZATION  ?1990  . INCISION AND DRAINAGE Right 01/06/2021   Procedure: INCISION AND DRAINAGE, ANTIBIOTIC BEAD PLACEMENT;  Surgeon: Edrick Kins, DPM;  Location: [facility];  Service: Podiatry;  Laterality: Right;  . SPINE SURGERY     Social History:  reports that he has been smoking cigarettes. He has a 40.00 pack-year smoking history. He has never used smokeless tobacco. He reports previous alcohol use. He reports  previous drug use.  Family / Support Systems Marital Status: Married Patient Roles: Spouse,Parent,Other (Comment) (retiree) Spouse/Significant Other: Con Memos  [phone]-cell  [phone] Children: Son in [place] and daughter who is local but has four small children Other Supports: Friends Anticipated Caregiver: Con Memos Ability/Limitations of Caregiver: Wife can temporary work from home for transition from hospital to home Caregiver Availability: 24/7 Family Dynamics: Close with family and friends who are supportive and involved. He reports it is mainly he and wife and they have managed before and will again.  Social History Preferred language: English Religion: Baptist Cultural Background: No issues Education: HS Read: Yes Write: Yes Employment Status: Disabled Date Retired/Disabled/Unemployed: 11 Legal History/Current Legal Issues: No issues Guardian/Conservator: none-according to MD pt is capable of making his own decisions while here. Wife plans to be here daily for support   Abuse/Neglect Abuse/Neglect Assessment Can Be Completed: Yes Physical Abuse: Denies Verbal Abuse: Denies Sexual Abuse: Denies Exploitation of patient/patient's resources: Denies Self-Neglect: Denies  Emotional Status Pt's affect, behavior and adjustment status: Pt reports he usually can manage and become independent again after surgery this one is more expensive and he will need to work at it. His main goal is to be as independent as he can be before going home Recent Psychosocial Issues: other health issues has been dealing with this leg for many months Psychiatric History: No issues deferrred depression screen due to still adjusting to rehab and therapies. Would benefit from seeing neuro-psych while here. Substance Abuse History: No issues  Patient / Family Perceptions, Expectations &  Goals Pt/Family understanding of illness & functional limitations: Pt and wife can explain his amputation and limitations as a  result for this. Both talk with the MD and feel they have a good understanding of his treatment plan going forward. He is not one who does not ask questions he has. Premorbid pt/family roles/activities: Husband, father, grandfather, retiree, friend, etc Anticipated changes in roles/activities/participation: resume Pt/family expectations/goals: Pt states: " I want to be as independent as I can be before going home and want my pain somewhat managed."  Wife states: " I hope he is doing well before coming home."  [employer]: Other (Comment) (has had HH and wound center in the past) Premorbid Home Care/DME Agencies: Other (Comment) (has scooter,rw, bsc, tub seat, crutches) Transportation available at discharge: wife Resource referrals recommended: Neuropsychology  Discharge Planning Living Arrangements: Spouse/significant other Support Systems: Spouse/significant other,Children,Other relatives,Friends/neighbors Type of Residence: Private residence Insurance Resources: Multimedia programmer (specify) Nurse, mental health) Financial Resources: SSD,Family Support Financial Screen Referred: No Living Expenses: Own Money Management: Patient,Spouse Does the patient have any problems obtaining your medications?: No Home Management: Wife Patient/Family Preliminary Plans: Return home with wife who is planning on working from home at first until pt is transitioned home. Pt hopes for the wound vac to come of today and the pain will be less in his leg, it is still very painful. Care Coordinator Anticipated Follow Up Needs: HH/OP  Clinical Impression Pleasant motivated gentleman who is trying to push past the pain to participate in therapies and improve. He hopes by getting the wound vac off today he can begin wearing the stump shrinker and getting closer to discharging home. Wife will be there with him at the beginning and will assist.Will work on discharge needs.  Elease Hashimoto 01/18/2021,  1:21 PM

## 2021-01-18 NOTE — Progress Notes (Addendum)
Patient ID: KAIYA MCGREGOR, male   DOB: 01-01-1956, 65 y.o.   MRN: [id number] S: Feels well, no complaints O:BP (!) 152/74   Pulse 84   Temp 98.4 F (36.9 C) (Oral)   Resp 14   Ht '6\' 2"'$  (1.88 m)   Wt 120.1 kg   SpO2 91%   BMI 33.99 kg/m   Intake/Output Summary (Last 24 hours) at 01/18/2021 1201 Last data filed at 01/18/2021 0715 Gross per 24 hour  Intake 708 ml  Output 3460 ml  Net -2752 ml   Intake/Output: I/O last 3 completed shifts: In: 77 [P.O.:865] Out: 4785 [Urine:4750; Drains:35]  Intake/Output this shift:  Total I/O In: 118 [P.O.:118] Out: -  Weight change:  Gen: NAD CVS: RRR Resp: CTA Abd: +BS, soft, NT/ND Ext: s/p RBKA with wound vac, + 1 edema LLE  Recent Labs  Lab 01/12/21 0335 01/13/21 0242 01/14/21 0747 01/15/21 0155 01/16/21 0153 01/17/21 1217 01/18/21 0209  NA 130* 131* 134* 134* 132* 135 133*  K 4.6 4.3 4.6 4.7 4.2 4.4 4.4  CL 103 104 107 105 105 108 105  CO2 13* 16* 16* 18* 16* 16* 16*  GLUCOSE 354* 173* 64* 108* 101* 113* 144*  BUN 64* 67* 71* 70* 72* 73* 73*  CREATININE 5.86* 5.78* 5.98* 5.86* 5.76* 5.64* 5.73*  ALBUMIN 1.5* 1.5* 1.5* 1.5* 1.5* 1.5* 1.5*  CALCIUM 7.9* 7.7* 7.9* 8.1* 7.8* 7.9* 7.9*  PHOS  --  6.7*  --   --  8.0* 7.6*  --   AST 98* 135* 147* 160* 151*  --  65*  ALT 120* 114* 133* 141* 156*  --  121*   Liver Function Tests: Recent Labs  Lab 01/15/21 0155 01/16/21 0153 01/17/21 1217 01/18/21 0209  AST 160* 151*  --  65*  ALT 141* 156*  --  121*  ALKPHOS 701* 681*  --  603*  BILITOT 3.5* 3.5*  --  3.7*  PROT 5.8* 5.6*  --  5.5*  ALBUMIN 1.5* 1.5* 1.5* 1.5*   No results for input(s): LIPASE, AMYLASE in the last 168 hours. No results for input(s): AMMONIA in the last 168 hours. CBC: Recent Labs  Lab 01/14/21 0747 01/15/21 0155 01/16/21 0153 01/16/21 1859 01/18/21 0209  WBC 12.3* 10.3 9.4 11.3* 10.2  NEUTROABS 8.9* 7.5 6.6  --  7.9*  HGB 8.3* 8.0* 7.6* 9.4* 8.0*  HCT 24.1* 23.1* 20.9* 27.1* 22.1*  MCV 87.3  88.2 87.1 88.6 89.1  PLT 248 230 222 232 217   Cardiac Enzymes: No results for input(s): CKTOTAL, CKMB, CKMBINDEX, TROPONINI in the last 168 hours. CBG: Recent Labs  Lab 01/17/21 1655 01/17/21 1751 01/17/21 2103 01/18/21 0627 01/18/21 1128  GLUCAP 65* 81 143* 141* 146*    Iron Studies: No results for input(s): IRON, TIBC, TRANSFERRIN, FERRITIN in the last 72 hours. Studies/Results: No results found. Marland Kitchen amLODipine  10 mg Oral Daily  . aspirin EC  81 mg Oral QHS  . atenolol  50 mg Oral Daily  . Chlorhexidine Gluconate Cloth  6 each Topical Daily  . cloNIDine  0.1 mg Oral BID  . docusate sodium  100 mg Oral BID  . hydrALAZINE  50 mg Oral BID  . insulin aspart  0-15 Units Subcutaneous TID WC  . insulin aspart  4 Units Subcutaneous TID WC  . insulin glargine  20 Units Subcutaneous Daily  . iron polysaccharides  150 mg Oral Daily  . omega-3 acid ethyl esters  1 g Oral Daily  .  oxyCODONE  10 mg Oral Q12H  . pregabalin  50 mg Oral TID  . sevelamer carbonate  800 mg Oral TID WC  . tamsulosin  0.4 mg Oral QPC supper  . traZODone  25-50 mg Oral QHS    BMET    Component Value Date/Time   NA 133 (L) 01/18/2021 0209   K 4.4 01/18/2021 0209   CL 105 01/18/2021 0209   CO2 16 (L) 01/18/2021 0209   GLUCOSE 144 (H) 01/18/2021 0209   BUN 73 (H) 01/18/2021 0209   CREATININE 5.73 (H) 01/18/2021 0209   CALCIUM 7.9 (L) 01/18/2021 0209   GFRNONAA 10 (L) 01/18/2021 0209   GFRAA >60 11/29/2019 2137   CBC    Component Value Date/Time   WBC 10.2 01/18/2021 0209   RBC 2.48 (L) 01/18/2021 0209   HGB 8.0 (L) 01/18/2021 0209   HCT 22.1 (L) 01/18/2021 0209   PLT 217 01/18/2021 0209   MCV 89.1 01/18/2021 0209   MCH 32.3 01/18/2021 0209   MCHC 36.2 (H) 01/18/2021 0209   RDW 18.3 (H) 01/18/2021 0209   LYMPHSABS 1.1 01/18/2021 0209   MONOABS 1.0 01/18/2021 0209   EOSABS 0.2 01/18/2021 0209   BASOSABS 0.1 01/18/2021 0209     Assessment/Plan:  1.  AKI: b/l creat from mid 2021 was  1.09.  Here creat 4.0 on admit 2/15, then peaked at 5.96 on 2/21.  Suspect ATN but urinary retention as below.  Renal ultrasound consistent with medical renal disease. Lisinopril, HCTZ dc'd.  Admitted to taking NSAIDs prior to admission and had been on multiple antibiotics over the past several weeks.  No indication for RRT at this time.  Essentially stable but quite impaired and has tolerated lasix.   1. Gave dose of Lasix 80 mg IV x 1 yesterday with good response. 2. Stopped cymbalta with renal failure 3. Continue to follow UOP and Scr.  2. 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 Per orthopedics.  3. HTN: note Lisinopril/HCTZ held. Acceptable control 4. Anemia-  Secondary to chronic illness. Tsat low, holding IV Fe in the setting of active infection. AKI contributing as well.  aranesp 40 mcg once on 2/23.  On oral iron.  PRBC's on 2/26 ordered  5. hyperphosphatemia - renal diet. Started renvela as binder  6. DM - Insulin per primary team.  7. Urinary retention: new per patient the last couple of weeks.  Now on flomax.  Now with foley and continue same.  Assess voiding trial soon - has been on flomax 8. Severe protein malnutrition - will need protein supplementation. 9. Disposition - cont with PT/OT 10. Abnormal LFT's - slowly improving.  Possible shock liver.   Donetta Potts, MD [employer] [phone]

## 2021-01-18 NOTE — Progress Notes (Signed)
Occupational Therapy Session Note  Patient Details  Name: Fernando Anthony MRN: [id number] Date of Birth: 20-Feb-1956  Today's Date: 01/18/2021 OT Individual Time: [id number] OT Individual Time Calculation (min): 57 min    Short Term Goals: Week 1:  OT Short Term Goal 1 (Week 1): Pt will perform BSC/toilet transfer with Min A and LRAD OT Short Term Goal 2 (Week 1): Pt will perform sit > stand in prep for LB ADL with no more than Min A OT Short Term Goal 3 (Week 1): Pt will perform LB dress with AE PRN with no more than Mod A OT Short Term Goal 4 (Week 1): Pt will tolerate wearing shrinker for 2+ hours to decrease edema in residual limb  Skilled Therapeutic Interventions/Progress Updates:    Treatment session with focus on self-care retraining and care for residual limb.  Pt reports pain in residual limb, ubt having just recently received pain meds.  Engaged in discussion regarding healing process and Dr. Cleon Gustin and wound vac protocol.  Pt reports due to wound vac pain and tubing unable to tolerate shrinker at this time.  Provided pt with handouts regarding phantom pain and massage/desensitization.  Therapist providing pt with inspection mirror and educating on importance of limb inspection - plan to further incorporate use of mirror during self-care tasks.  Pt completed bed mobility to come to sitting EOB with increased time due to pain, but overall CGA.  Completed sit > stand from elevated EOB with Mod assist.  Pt then able to stand pivot to recliner with RW and min-mod assist.  Pt reports increased pain in residual limb in standing.  Therapist raised recliner leg rest for improved positioning and support to residual limb.  Pt remained upright in recliner with all needs in reach.  MD arrived during session, reiterated wound vac and shrinker as well as discussed modifying pain meds.  Therapy Documentation Precautions:  Precautions Precautions: Fall Precaution Comments: RLE wound vac/  swollen scotum Required Braces or Orthoses: Other Brace Other Brace: R residual limb guard and R shrinker- Pt still not wearing shrinker.  Says it is too painful due to swelling. Encouraged patient to wear shrinker but continues not to. Restrictions Weight Bearing Restrictions: Yes RLE Weight Bearing: Non weight bearing General:   Vital Signs: Therapy Vitals Pulse Rate: 84 BP: (!) 152/74 Pain: Pain Assessment Pain Scale: 0-10 Pain Score:6 Received pain meds just prior to session   Therapy/Group: Individual Therapy  [name] 01/18/2021, 9:43 AM

## 2021-01-18 NOTE — Progress Notes (Signed)
Fernando Heys, MD  Physician  Physical Medicine and Rehabilitation  PMR Pre-admission     Signed  Date of Service:  01/15/2021  4:12 PM      Related encounter: ED to Hosp-Admission (Discharged) from 01/04/2021 in [place] all [x]Manual[x]Template[x]Copied  Added by: [x]Boyette, Vertis Kelch, RN[x]Lovorn, Jinny Blossom, MD   []Hover for details  PMR Admission Coordinator Pre-Admission Assessment   Patient: Fernando Anthony is an 65 y.o., male MRN: [id number] DOB: [birth year] Height: 6' 2.02" (188 cm) Weight: 101.9 kg   Insurance Information HMO:     PPO:      PCP:      IPA:      80/20:      OTHER:  PRIMARY: [employer] [place]      Policy#: [id number]      Subscriber: wife CM Name: Altha Harm      Phone#: [phone]     Fax#: [phone] Pre-Cert#: [id number] approved until 3/2      Employer: [employer] Benefits:  Phone #: [phone]     Name: 01/14/2021 Eff. Date: 2/24     Deduct: $2800      Out of Pocket Max: $2800 CIR: 100%      SNF: 100% 12 0 days per year Outpatient: 100%     Co-Pay: 60 visits combined Home Health: 100%      Co-Pay: 50 visits DME: 100%     Co-Pay: none Providers: in network  SECONDARY: none      Policy#:      Phone#:    Development worker, community:       Phone#:    The Engineer, petroleum" for patients in Inpatient Rehabilitation Facilities with attached "Privacy Act [place] Records" was provided and verbally reviewed with: N/A   Emergency Contact Information         Contact Information     Name Relation Home Work Mobile    [place] Spouse [phone]         Current Medical History  Patient Admitting Diagnosis: BKA   History of Present Illness:  65 year old right-handed male with history of diabetes mellitus, hypertension, hyperlipidemia, tobacco abuse, peripheral vascular disease with partial amputation first ray right foot  12/05/2018 as well as right great toe amputation at [facility] 09/18/2013.   Presented 01/04/2021 with right plantar ulceration as well as bouts of malaise and nausea with scrotal swelling.  He had initially been seeing podiatry for his right plantar ulceration.  MRI of right foot showed open wound involving plantar aspect of the heel with gas noted in the soft tissues.  Non enhancing soft tissue suggestive of tissue necrosis.  No discrete rim-enhancing drain able soft tissue abscess.  Abnormal signal intensity and enhancement along the plantar cortex of the calcaneus consistent with early osteomyelitis.  Admission chemistry sodium 129 potassium 3.4 glucose 239 BUN 56 creatinine 3.94 from baseline 11/29/2019 of 1.01, WBC 25,300, hemoglobin 9.9, BNP 449, sedimentation rate 129, lactic acid 3.0, blood cultures positive enterobacter ales, hemoglobin A1c 10.5.  Infectious disease currently consulted for positive blood cultures placed on broad-spectrum antibiotics with follow-up orthopedic service Dr. 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  All current antibiotics have been discontinued with latest WBC of 10,300.  Nephrology consulted for AKI suspect ATN and renal ultrasound showed no hydronephrosis.  His lisinopril and HCTZ were  discontinued with latest creatinine 5.86 and no current plan for hemodialysis at this time..  Patient noted significant scrotal edema some urinary retention placed on Flomax a Foley catheter tube had been placed.  Acute on chronic anemia remained stable latest hemoglobin 8.0.    Patient's medical [facility] from [facility] has been reviewed by the rehabilitation admission coordinator and physician.   Past Medical History      Past Medical History:  Diagnosis Date  . Back pain    . Diabetes mellitus    . Headache(784.0)      general  . Hyperlipidemia    . Hypertension    . Osteomyelitis ([place]) 11/2018    RIGHT  FOOT      Family History   family history includes Cancer in his mother; Heart disease in his father.   Prior Rehab/Hospitalizations Has the patient had prior rehab or hospitalizations prior to admission? Yes   Has the patient had major surgery during 100 days prior to admission? Yes              Current Medications   Current Facility-Administered Medications:  .  0.9 %  sodium chloride infusion, , Intravenous, Continuous, Newt Minion, MD, Stopped at 01/12/21 [phone] .  acetaminophen (TYLENOL) tablet 325-650 mg, 325-650 mg, Oral, Q6H PRN, Newt Minion, MD, 650 mg at 01/11/21 2230 .  amLODipine (NORVASC) tablet 10 mg, 10 mg, Oral, Daily, Newt Minion, MD, 10 mg at 01/15/21 0820 .  aspirin EC tablet 81 mg, 81 mg, Oral, QHS, Newt Minion, MD, 81 mg at 01/14/21 2210 .  atenolol (TENORMIN) tablet 50 mg, 50 mg, Oral, Daily, Newt Minion, MD, 50 mg at 01/14/21 2210 .  bisacodyl (DULCOLAX) suppository 10 mg, 10 mg, Rectal, Daily PRN, Newt Minion, MD .  cloNIDine (CATAPRES) tablet 0.1 mg, 0.1 mg, Oral, BID, Newt Minion, MD, 0.1 mg at 01/15/21 0820 .  docusate sodium (COLACE) capsule 100 mg, 100 mg, Oral, BID, Newt Minion, MD, 100 mg at 01/13/21 4481 .  hydrALAZINE (APRESOLINE) tablet 50 mg, 50 mg, Oral, BID, Newt Minion, MD, 50 mg at 01/15/21 0820 .  HYDROmorphone (DILAUDID) injection 0.5-1 mg, 0.5-1 mg, Intravenous, Q4H PRN, Newt Minion, MD, 1 mg at 01/15/21 1457 .  insulin aspart (novoLOG) injection 0-15 Units, 0-15 Units, Subcutaneous, TID WC, Newt Minion, MD, 2 Units at 01/14/21 1709 .  insulin aspart (novoLOG) injection 0-5 Units, 0-5 Units, Subcutaneous, QHS, Newt Minion, MD, 2 Units at 01/11/21 2106 .  insulin aspart (novoLOG) injection 4 Units, 4 Units, Subcutaneous, TID WC, Newt Minion, MD, 4 Units at 01/14/21 1709 .  insulin glargine (LANTUS) injection 28 Units, 28 Units, Subcutaneous, QHS, Ghimire, Kuber, MD .  iron polysaccharides (NIFEREX) capsule 150  mg, 150 mg, Oral, Daily, Claudia Desanctis, MD, 150 mg at 01/15/21 0820 .  methocarbamol (ROBAXIN) tablet 500 mg, 500 mg, Oral, Q6H PRN, 500 mg at 01/13/21 1150 **OR** methocarbamol (ROBAXIN) 500 mg in dextrose 5 % 50 mL IVPB, 500 mg, Intravenous, Q6H PRN, Newt Minion, MD .  metoCLOPramide (REGLAN) tablet 5-10 mg, 5-10 mg, Oral, Q8H PRN **OR** metoCLOPramide (REGLAN) injection 5-10 mg, 5-10 mg, Intravenous, Q8H PRN, Newt Minion, MD .  omega-3 acid ethyl esters (LOVAZA) capsule 1 g, 1 g, Oral, Daily, Newt Minion, MD, 1 g at 01/15/21 [phone] .  ondansetron (ZOFRAN) tablet 4 mg, 4 mg, Oral, Q6H PRN **OR** ondansetron (ZOFRAN) injection 4 mg,  4 mg, Intravenous, Q6H PRN, Newt Minion, MD .  oxyCODONE (Oxy IR/ROXICODONE) immediate release tablet 10-15 mg, 10-15 mg, Oral, Q4H PRN, Newt Minion, MD, 15 mg at 01/15/21 1612 .  oxyCODONE (Oxy IR/ROXICODONE) immediate release tablet 5-10 mg, 5-10 mg, Oral, Q4H PRN, Newt Minion, MD .  polyethylene glycol (MIRALAX / GLYCOLAX) packet 17 g, 17 g, Oral, Daily PRN, Barb Merino, MD .  pregabalin (LYRICA) capsule 50 mg, 50 mg, Oral, TID, Newt Minion, MD, 50 mg at 01/15/21 1546 .  promethazine (PHENERGAN) tablet 12.5 mg, 12.5 mg, Oral, Q4H PRN, Newt Minion, MD .  senna-docusate (Senokot-S) tablet 1 tablet, 1 tablet, Oral, QHS PRN, Newt Minion, MD, 1 tablet at 01/08/21 2126 .  sevelamer carbonate (RENVELA) tablet 800 mg, 800 mg, Oral, TID WC, Claudia Desanctis, MD, 800 mg at 01/15/21 1230 .  sodium chloride flush (NS) 0.9 % injection 3 mL, 3 mL, Intravenous, PRN, Newt Minion, MD .  tamsulosin [facility]) capsule 0.4 mg, 0.4 mg, Oral, QPC supper, Newt Minion, MD, 0.4 mg at 01/14/21 1709   Patients Current Diet:     Diet Order                      Diet renal/carb modified with fluid restriction Diet-HS Snack? Nothing; Fluid restriction: 1500 mL Fluid; Room service appropriate? Yes; Fluid consistency: Thin  Diet effective now                       Precautions / Restrictions Precautions Precautions: Fall,Other (comment) Precaution Comments: RLE wound vac/ swollen scotum Other Brace: R residual limb guard and R shrinker- Pt still not wearing shrinker.  Says it's too painful with tubing from wound vac pressing on limb.  Encouraged patient to wear shrinker but continues not to. Restrictions Weight Bearing Restrictions: Yes RLE Weight Bearing: Non weight bearing    Has the patient had 2 or more falls or a fall with injury in the past year? No   Prior Activity Level Limited Community (1-2x/wk): Mod I with RW   Prior Functional Level Self Care: Did the patient need help bathing, dressing, using the toilet or eating? Independent   Indoor Mobility: Did the patient need assistance with walking from room to room (with or without device)? Independent   Stairs: Did the patient need assistance with internal or external stairs (with or without device)? Independent   Functional Cognition: Did the patient need help planning regular tasks such as shopping or remembering to take medications? [place] / Equipment Home Assistive Devices/Equipment: Engineer, drilling (specify type),Cane (specify quad or straight),Shower chair with back (reading glasses, scooter) Home Equipment: Walker - 2 wheels,Crutches,Bedside commode,Shower seat,Grab bars - tub/shower,Electric scooter,Other (comment)   Prior Device Use: Indicate devices/aids used by the patient prior to current illness, exacerbation or injury? Walker   Current Functional Level Cognition   Overall Cognitive Status: Within Functional Limits for tasks assessed Orientation Level: Oriented X4 General Comments: Very agreeable to session. Verablizing education back to therapist    Extremity Assessment (includes Sensation/Coordination)   Upper Extremity Assessment: Overall WFL for tasks assessed (Increased edema)  Lower Extremity Assessment: Defer to PT  evaluation RLE Deficits / Details: S/p R transtibial amputation; noted swelling throughout (limb guard and shrinker donned); able to perform SLR LLE Deficits / Details: Functionally >3/5 throughout, pt reports LLE feeling weak with standing attempts; able to perform SLR while seated but unable  to maintain hip flexion and poor eccentric control with SLR     ADLs   Overall ADL's : Needs assistance/impaired Eating/Feeding: Set up,Sitting Grooming: Set up,Sitting Upper Body Bathing: Set up,Sitting Lower Body Bathing: Moderate assistance,Sitting/lateral leans Upper Body Dressing : Set up,Sitting Lower Body Dressing: Moderate assistance,Sitting/lateral leans Lower Body Dressing Details (indicate cue type and reason): partial stand Toilet Transfer: Squat-pivot,Transfer board,Moderate Technical brewer Details (indicate cue type and reason): squat pivot vs lateral scoots to drop arm chair Toileting- Clothing Manipulation and Hygiene: Sitting/lateral lean,Sit to/from stand,Moderate assistance Toileting - Clothing Manipulation Details (indicate cue type and reason): partial stand General ADL Comments: Fabricating scrotal sling for edema management. Education pt on use and wear.     Mobility   Overal bed mobility: Needs Assistance Bed Mobility: Rolling Rolling: Min guard General bed mobility comments: Cues for hand placement to improve sequencing.  Pt able to progress LEs to edge of bed.     Transfers   Overall transfer level: Needs assistance Equipment used: Rolling walker (2 wheeled) Transfers: Sit to/from Stand Sit to Stand: Mod assist,From elevated surface General transfer comment: Mod assistance to boost into standing.  Cues for hand/foot placement and forward weight shifting.     Ambulation / Gait / Stairs / Wheelchair Mobility   Ambulation/Gait Ambulation/Gait assistance: Herbalist (Feet): 4 Feet Assistive device: Rolling walker (2  wheeled) Gait Pattern/deviations: Step-to pattern,Trunk flexed General Gait Details: Pt performed three hop to steps from bed to recliner.  Pt required assistance to turn RW and back to seated surface. Gait velocity: Decreased     Posture / Balance Dynamic Sitting Balance Sitting balance - Comments: good static balance, fair dynamic balance. Balance Overall balance assessment: Needs assistance Sitting balance-Leahy Scale: Fair Sitting balance - Comments: good static balance, fair dynamic balance. Standing balance-Leahy Scale: Poor Standing balance comment: Heavy reliance on BUE support     Special needs/care consideration Wound VAC to surgical site      Previous Home Environment  Living Arrangements: Spouse/significant other  Lives With: Spouse Available Help at Discharge:  (wife initially 24/7 then she will return to work) Type of Home: House Home Layout: One level Home Access: Ramped entrance Bathroom Shower/Tub: Chiropodist: Standard Bathroom Accessibility: Yes How Accessible: Accessible via walker Home Care Services: No Additional Comments: Wife works full-time, but has taken time off and can work from home temporarily   Discharge Living Setting Plans for Discharge Living Setting: Patient's home,Lives with (comment) (wife) Type of Home at Discharge: House Discharge Home Layout: One level Discharge Home Access: [place] entrance Discharge Bathroom Shower/Tub: [place] unit Discharge Bathroom Toilet: Standard Discharge Bathroom Accessibility: Yes How Accessible: Accessible via walker Does the patient have any problems obtaining your medications?: No   Social/Family/Support Systems Contact Information: wife, [place] Anticipated Caregiver: wife Anticipated Ambulance person Information: see above Ability/Limitations of Caregiver: wife temporarily off work Building control surveyor Availability: 24/7 Discharge Plan Discussed with Primary Caregiver: Yes Is Caregiver  In Agreement with Plan?: Yes Does Caregiver/Family have Issues with Lodging/Transportation while Pt is in Rehab?: No   Goals Patient/Family Goal for Rehab: Mod I to supervision with PT and OT Expected length of stay: ELOS 7 to 10 days Pt/Family Agrees to Admission and willing to participate: Yes Program Orientation Provided & Reviewed with Pt/Caregiver Including Roles  & Responsibilities: Yes   Decrease burden of Care through IP rehab admission: n/a   Possible need for SNF placement upon discharge: not anticipated   Patient Condition: I  have reviewed medical records from [facility], spoken with  patient and spouse. I met with patient at the bedside for inpatient rehabilitation assessment.  Patient will benefit from ongoing PT and OT, can actively participate in 3 hours of therapy a day 5 days of the week, and can make measurable gains during the admission.  Patient will also benefit from the coordinated team approach during an Inpatient Acute Rehabilitation admission.  The patient will receive intensive therapy as well as Rehabilitation physician, nursing, social worker, and care management interventions.  Due to bladder management, bowel management, safety, skin/wound care, disease management, medication administration, pain management and patient education the patient requires 24 hour a day rehabilitation nursing.  The patient is currently mod assist overall with mobility and basic ADLs.  Discharge setting and therapy post discharge at home with home health is anticipated.  Patient has agreed to participate in the Acute Inpatient Rehabilitation Program and will admit Saturday 01/16/2021 when bed is available.   Preadmission Screen Completed By:  Cleatrice Burke, 01/15/2021 4:14 PM ______________________________________________________________________   Discussed status with Dr. Dagoberto Ligas on  01/15/2021 at  1615 and received approval for admission Saturday 01/16/2021 when bed is  available.   Admission Coordinator:  Cleatrice Burke, RN, time  4098 Date 01/15/2021    Assessment/Plan: Diagnosis: 1. Does the need for close, 24 hr/day Medical supervision in concert with the patient's rehab needs make it unreasonable for this patient to be served in a less intensive setting? Yes 2. Co-Morbidities requiring supervision/potential complications: HTN, DM with A1c of 10.5, R osteomyelitis s/p BKA with VAC; urinary retention 3. Due to bladder management, bowel management, safety, skin/wound care, disease management, medication administration, pain management and patient education, does the patient require 24 hr/day rehab nursing? Yes 4. Does the patient require coordinated care of a physician, rehab nurse, PT, OT, and SLP to address physical and functional deficits in the context of the above medical diagnosis(es)? Yes Addressing deficits in the following areas: balance, endurance, locomotion, strength, transferring, bowel/bladder control, bathing, dressing, feeding, grooming and toileting 5. Can the patient actively participate in an intensive therapy program of at least 3 hrs of therapy 5 days a week? Yes 6. The potential for patient to make measurable gains while on inpatient rehab is good 7. Anticipated functional outcomes upon discharge from inpatient rehab: modified independent and supervision PT, modified independent and supervision OT, n/a SLP 8. Estimated rehab length of stay to reach the above functional goals is: 7-10 days 9. Anticipated discharge destination: Home 10. Overall Rehab/Functional Prognosis: good     MD Signature:            Revision History                             Note Details  Jan Fireman, MD File Time 01/16/2021 11:26 AM  Author Type Physician Status Signed  Last Editor Fernando Heys, MD Service Physical Medicine and [place] # [id number] Admit Date 01/16/2021

## 2021-01-18 NOTE — Progress Notes (Signed)
PROGRESS NOTE   Subjective/Complaints: Has 8/10 surgical site pain- asks about removal of wound vac. Today is day 7 so may be removed. Discussed adding long acting oxycodone and reducing PRN use- patient had to wake at night for pain medication.   ROS:  Pt denies SOB, abd pain, CP, N/V/C/D, and vision changes, +residual limb pain.    Objective:   No results found. Recent Labs    01/16/21 1859 01/18/21 0209  WBC 11.3* 10.2  HGB 9.4* 8.0*  HCT 27.1* 22.1*  PLT 232 217   Recent Labs    01/17/21 1217 01/18/21 0209  NA 135 133*  K 4.4 4.4  CL 108 105  CO2 16* 16*  GLUCOSE 113* 144*  BUN 73* 73*  CREATININE 5.64* 5.73*  CALCIUM 7.9* 7.9*    Intake/Output Summary (Last 24 hours) at 01/18/2021 1001 Last data filed at 01/18/2021 0715 Gross per 24 hour  Intake 708 ml  Output 3460 ml  Net -2752 ml        Physical Exam: Vital Signs Blood pressure (!) 152/74, pulse 84, temperature 98.4 F (36.9 C), temperature source Oral, resp. rate 14, height '6\' 2"'$  (1.88 m), weight 120.1 kg, SpO2 91 %.  Physical Exam Gen: no distress, normal appearing HEENT: oral mucosa pink and moist, NCAT Cardio: Reg rate Chest: normal effort, normal rate of breathing Abd: soft, non-distended Ext: no edema Psych: pleasant, normal affect Genitourinary:    Comments: has foley, but scrotum is smaller- medium grapefruit with a few more normal wrinkles of scrotum.  Musculoskeletal:     Cervical back: Normal range of motion. No rigidity.     Comments: UEs 5/5 in biceps, triceps, WE, grip and finger abd B/L LLE- HF 4/5, KE, 4/5, DF and PF 5-/5 RLE- HF 3-/5, KE 3-/5 R BKA- with VAC in place with knee immobilizer  Skin:    Comments: Wound VAC in place to right transtibial amputation site. Also wearing a knee immobilizer VAC has nothing in canister- but providing suction- still in place Mild swelling of forearms and swelling to groin B/L- 3-4+  pitting edema B/L Buttocks look OK Has a few tiny heat rash spots on back  Neuro: Decreased sensation to light touch from mid calf downwards RLE- knee downwards is decreased greatly     Assessment/Plan: 1. Functional deficits which require 3+ hours per day of interdisciplinary therapy in a comprehensive inpatient rehab setting.  Physiatrist is providing close team supervision and 24 hour management of active medical problems listed below.  Physiatrist and rehab team continue to assess barriers to discharge/monitor patient progress toward functional and medical goals  Care Tool:  Bathing    Body parts bathed by patient: Right arm,Left arm,Chest,Abdomen,Right upper leg,Left upper leg,Face   Body parts bathed by helper: Left lower leg,Buttocks,Front perineal area Body parts n/a: Right lower leg   Bathing assist Assist Level: Maximal Assistance - Patient 24 - 49%     Upper Body Dressing/Undressing Upper body dressing   What is the patient wearing?: Pull over shirt    Upper body assist Assist Level: Moderate Assistance - Patient 50 - 74%    Lower Body Dressing/Undressing Lower body dressing  What is the patient wearing?: Pants     Lower body assist Assist for lower body dressing: Maximal Assistance - Patient 25 - 49%     Toileting Toileting Toileting Activity did not occur (Clothing management and hygiene only): N/A (no void or bm)  Toileting assist Assist for toileting:  (not assessed at eval)     Transfers Chair/bed transfer  Transfers assist     Chair/bed transfer assist level: Moderate Assistance - Patient 50 - 74%     Locomotion Ambulation   Ambulation assist   Ambulation activity did not occur: Safety/medical concerns          Walk 10 feet activity   Assist  Walk 10 feet activity did not occur: Safety/medical concerns        Walk 50 feet activity   Assist Walk 50 feet with 2 turns activity did not occur: Safety/medical  concerns         Walk 150 feet activity   Assist Walk 150 feet activity did not occur: Safety/medical concerns         Walk 10 feet on uneven surface  activity   Assist Walk 10 feet on uneven surfaces activity did not occur: Safety/medical concerns         Wheelchair     Assist Will patient use wheelchair at discharge?: Yes Type of Wheelchair: Manual    Wheelchair assist level: Dependent - Patient 0%      Wheelchair 50 feet with 2 turns activity    Assist        Assist Level: Dependent - Patient 0%   Wheelchair 150 feet activity     Assist      Assist Level: Dependent - Patient 0%   Blood pressure (!) 152/74, pulse 84, temperature 98.4 F (36.9 C), temperature source Oral, resp. rate 14, height '6\' 2"'$  (1.88 m), weight 120.1 kg, SpO2 91 %.  Medical Problem List and Plan: 1.  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  Wound VAC as directed             -patient may  Shower once VAC off- and R BKA covered             -ELOS/Goals: 14-18 days- mod I to supervision  -Continue CIR 2.  Antithrombotics: -DVT/anticoagulation: SCDs left lower extremity             -antiplatelet therapy: Continue Aspirin 81 mg daily 3. Pain Management: Lyrica 50 mg 3 times daily, Robaxin and oxycodone as needed- add Duloxetine 20 mg QHS for nerve pain  2/27- no side effects so far- con't regimen  2/28: waking at night for pain medication. Add Oxycontin '10mg'$  BID, discussed how this works and using less PRN medication.  4. Mood: Provide emotional support             -antipsychotic agents: N/A 5. Neuropsych: This patient is capable of making decisions on his own behalf. 6. Skin/Wound Care: Routine skin checks. May d/c wound vac 2/28 7. Fluids/Electrolytes/Nutrition: Routine in and outs with follow-up chemistries 8.  Acute on chronic anemia.  Continue Niferex.    2/28: Hgb 8, repeat tomorrow.  9.  AKI/ATN.  Follow-up nephrology  services.  No current plan for hemodialysis- Current Cr 5.76  2/28: discussed etiologies of his ATN: Cr 5.73, appreciate nephrology following.  10.  Hypertension.  Hydralazine 50 mg twice daily, Norvasc 10 mg daily, clonidine 0.1 mg twice daily, Tenormin 50 mg daily.  Monitor with increased mobility  2/27- BP controlled- is slightly bradycardic- con't regimen for now 11.  Diabetes mellitus with peripheral neuropathy.  Hemoglobin A1c 10.5.  NovoLog 4 units 3 times daily, Lantus insulin 35 units nightly.  Check blood sugars before meals and at bedtime  2/27- BGs 127-160- con't regimen for now-  12.  Hyperlipidemia.  Lovaza 13.  Urinary retention with scrotal edema.  Flomax 0.4 mg daily.  Continue Foley tube for now until scrotal edema improves and then plan voiding trial.  2/27- elevating on hand towel- is working- also , Renal giving 80 mg IV Lasix x1 today- should also help LE edema/and scrotal edema.  14.  History of tobacco abuse.  Counseling 15. Protein malnutrition- severe- Alb 1.5- cause of edema likely 16. LE edema- severe- don't see Diuretic- likely due to Renal issues- elevating scrotum- con't regimen  2/27- see #13 17. Insomnia- try Trazodone 25-50 mg QHS for sleep   2/27- working- con't regimen 18. Transaminitis: tylenol d/ced, repeat tomorrow     LOS: 2 days A FACE TO FACE EVALUATION WAS PERFORMED  Krutika P Raulkar 01/18/2021, 10:01 AM

## 2021-01-19 DIAGNOSIS — Z89511 Acquired absence of right leg below knee: Secondary | ICD-10-CM | POA: Diagnosis not present

## 2021-01-19 LAB — GLUCOSE, CAPILLARY
Glucose-Capillary: 137 mg/dL — ABNORMAL HIGH (ref 70–99)
Glucose-Capillary: 154 mg/dL — ABNORMAL HIGH (ref 70–99)
Glucose-Capillary: 122 mg/dL — ABNORMAL HIGH (ref 70–99)
Glucose-Capillary: 58 mg/dL — ABNORMAL LOW (ref 70–99)
Glucose-Capillary: 82 mg/dL (ref 70–99)
Glucose-Capillary: 118 mg/dL — ABNORMAL HIGH (ref 70–99)

## 2021-01-19 LAB — COMPREHENSIVE METABOLIC PANEL
ALT: 114 U/L — ABNORMAL HIGH (ref 0–44)
AST: 57 U/L — ABNORMAL HIGH (ref 15–41)
Albumin: 1.6 g/dL — ABNORMAL LOW (ref 3.5–5.0)
Alkaline Phosphatase: 626 U/L — ABNORMAL HIGH (ref 38–126)
Anion gap: 11 (ref 5–15)
BUN: 75 mg/dL — ABNORMAL HIGH (ref 8–23)
CO2: 18 mmol/L — ABNORMAL LOW (ref 22–32)
Calcium: 8 mg/dL — ABNORMAL LOW (ref 8.9–10.3)
Chloride: 104 mmol/L (ref 98–111)
Creatinine, Ser: 6 mg/dL — ABNORMAL HIGH (ref 0.61–1.24)
GFR, Estimated: 10 mL/min — ABNORMAL LOW (ref >60)
Glucose, Bld: 142 mg/dL — ABNORMAL HIGH (ref 70–99)
Potassium: 4.5 mmol/L (ref 3.5–5.1)
Sodium: 133 mmol/L — ABNORMAL LOW (ref 135–145)
Total Bilirubin: 3.8 mg/dL — ABNORMAL HIGH (ref 0.3–1.2)
Total Protein: 6 g/dL — ABNORMAL LOW (ref 6.5–8.1)

## 2021-01-19 LAB — CBC
HCT: 24.4 % — ABNORMAL LOW (ref 39.0–52.0)
Hemoglobin: 8.3 g/dL — ABNORMAL LOW (ref 13.0–17.0)
MCH: 30.9 pg (ref 26.0–34.0)
MCHC: 34 g/dL (ref 30.0–36.0)
MCV: 90.7 fL (ref 80.0–100.0)
Platelets: 232 10*3/uL (ref 150–400)
RBC: 2.69 MIL/uL — ABNORMAL LOW (ref 4.22–5.81)
RDW: 19.1 % — ABNORMAL HIGH (ref 11.5–15.5)
WBC: 9.2 10*3/uL (ref 4.0–10.5)
nRBC: 0 % (ref 0.0–0.2)

## 2021-01-19 MED ORDER — ACETAMINOPHEN 325 MG PO TABS
650.0000 mg | ORAL_TABLET | Freq: Three times a day (TID) | ORAL | Status: DC
  Administered 2021-01-19 – 2021-01-28 (×27): 650 mg via ORAL
  Filled 2021-01-19 (×28): qty 2

## 2021-01-19 MED ORDER — INSULIN ASPART 100 UNIT/ML ~~LOC~~ SOLN
3.0000 [IU] | Freq: Three times a day (TID) | SUBCUTANEOUS | Status: DC
Start: 2021-01-19 — End: 2021-01-21
  Administered 2021-01-20 – 2021-01-21 (×3): 3 [IU] via SUBCUTANEOUS

## 2021-01-19 NOTE — IPOC Note (Signed)
Overall Plan of Care [facility]) Patient Details Name: Fernando Anthony MRN: [id number] DOB: 1956-05-15  Admitting Diagnosis: Right below-knee amputee [facility])  Hospital Problems: Principal Problem:   Right below-knee amputee [facility]) Active Problems:   AKI (acute kidney injury) ([place])   Severe protein-calorie malnutrition ([place])     Functional Problem List: Nursing    PT Safety,Pain,Motor,Endurance,Edema,Balance,Sensory  OT Balance,Behavior,Edema,Endurance,Motor,Pain,Safety,Sensory,Skin Integrity  SLP    TR         Basic ADL's: OT Grooming,Bathing,Dressing,Toileting     Advanced  ADL's: OT       Transfers: PT Bed Mobility,Bed to [place]  OT Toilet,Tub/Shower     Locomotion: PT Ambulation,Wheelchair Mobility     Additional Impairments: OT None  SLP        TR      Anticipated Outcomes Item Anticipated Outcome  Self Feeding no goal  Swallowing      Basic self-care  Supervision - Min A  Toileting  Mod I   Bathroom Transfers Mod I  Bowel/Bladder     Transfers  mod I  Locomotion  supervision  Communication     Cognition     Pain     Safety/Judgment      Therapy Plan: PT Intensity: Minimum of 1-2 x/day ,45 to 90 minutes PT Frequency: 5 out of 7 days PT Duration Estimated Length of Stay: 12-14 OT Intensity: Minimum of 1-2 x/day, 45 to 90 minutes OT Frequency: 5 out of 7 days OT Duration/Estimated Length of Stay: 15-18 days     Due to the current state of emergency, patients may not be receiving their 3-hours of Medicare-mandated therapy.   Team Interventions: Nursing Interventions    PT interventions Ambulation/gait training,Balance/vestibular training,Community reintegration,Pain management,Neuromuscular re-education,Functional mobility training,DME/adaptive equipment instruction,Patient/family education,Skin care/wound management,Stair training,Therapeutic Activities,Therapeutic Exercise,UE/LE Strength taining/ROM,Wheelchair  propulsion/positioning  OT Interventions Balance/vestibular training,Discharge planning,Pain management,Self Care/advanced ADL retraining,Therapeutic Activities,UE/LE Coordination activities,Visual/perceptual remediation/compensation,Therapeutic Exercise,Skin care/wound managment,Patient/family education,Functional mobility training,Disease mangement/prevention,Cognitive remediation/compensation,Community Corporate treasurer re-education,Psychosocial support,UE/LE Strength taining/ROM,Wheelchair propulsion/positioning,Splinting/orthotics  SLP Interventions    TR Interventions    SW/CM Interventions Discharge Planning,Patient/Family Education,Psychosocial Support   Barriers to Discharge MD  Medical stability  Nursing      PT Wound Care,Weight bearing restrictions    OT      SLP      SW       Team Discharge Planning: Destination: PT-Home ,OT- Home , SLP-  Projected Follow-up: PT-Home health PT, OT-  Home health OT, SLP-  Projected Equipment Needs: PT-Wheelchair (measurements),Wheelchair cushion (measurements) (Pt owns RW, cane, scooter), OT- Tub/shower bench,3 in 1 bedside comode, SLP-  Equipment Details: PT- , OT-  Patient/family involved in discharge planning: PT- Patient,  OT-Patient,Family member/caregiver, SLP-   MD ELOS: 14-18 days S Medical Rehab Prognosis:  Good Assessment: Fernando Anthony is a 65 year old man who is admitted to CIR with decreased functional ability secondary to right transtibial amputation on 2/21 secondary to right calcaneal osteomyelitis. Wound vac has been removed. Pain is severe and medications have been adjusted. Cr is severely elevated and is being monitored daily with nephrology following.    See Team Conference Notes for weekly updates to the plan of care

## 2021-01-19 NOTE — Progress Notes (Signed)
Pt complains of black compression sock causing pain to right lower extremity due to tightness. RN removed sock and elevated leg. RN notified charge nurse of removal of sock. Pt stable at this time. RN will continue to monitor.

## 2021-01-19 NOTE — Progress Notes (Addendum)
PROGRESS NOTE   Subjective/Complaints: Wound vac removed yesterday.  C/o pain with shrinker and compression sock- ace wrap for now and new shrinker ordered. Cr trending up to 6   ROS:  Pt denies SOB, abd pain, CP, N/V/C/D, and vision changes, +residual limb pain, urinary retention   Objective:   No results found. Recent Labs    01/18/21 0209 01/19/21 0426  WBC 10.2 9.2  HGB 8.0* 8.3*  HCT 22.1* 24.4*  PLT 217 232   Recent Labs    01/18/21 0209 01/19/21 0426  NA 133* 133*  K 4.4 4.5  CL 105 104  CO2 16* 18*  GLUCOSE 144* 142*  BUN 73* 75*  CREATININE 5.73* 6.00*  CALCIUM 7.9* 8.0*    Intake/Output Summary (Last 24 hours) at 01/19/2021 [id number] Last data filed at 01/19/2021 0715 Gross per 24 hour  Intake 854 ml  Output 4050 ml  Net -3196 ml        Physical Exam: Vital Signs Blood pressure 137/68, pulse (!) (P) 53, temperature (P) 98.2 F (36.8 C), temperature source (P) Oral, resp. rate 14, height '6\' 2"'$  (1.88 m), weight 120.1 kg, SpO2 (P) 94 %.  Physical Exam Gen: no distress, normal appearing HEENT: oral mucosa pink and moist, NCAT Cardio: Bradycardic Chest: normal effort, normal rate of breathing Abd: soft, non-distended Ext: no edema Psych: pleasant, normal affect Genitourinary:    Comments: has foley, but scrotum is smaller- medium grapefruit with a few more normal wrinkles of scrotum.  Musculoskeletal:     Cervical back: Normal range of motion. No rigidity.     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 Also wearing a knee immobilizer Mild swelling of forearms and swelling to groin B/L- 3-4+ pitting edema B/L Buttocks look OK Has a few tiny heat rash spots on back  Neuro: Decreased sensation to light touch from mid calf  downwards RLE- knee downwards is decreased greatly     Assessment/Plan: 1. Functional deficits which require 3+ hours per day of interdisciplinary therapy in a comprehensive inpatient rehab setting.  Physiatrist is providing close team supervision and 24 hour management of active medical problems listed below.  Physiatrist and rehab team continue to assess barriers to discharge/monitor patient progress toward functional and medical goals  Care Tool:  Bathing    Body parts bathed by patient: Right arm,Left arm,Chest,Abdomen,Right upper leg,Left upper leg,Face   Body parts bathed by helper: Left lower leg,Buttocks,Front perineal area Body parts n/a: Right lower leg   Bathing assist Assist Level: Maximal Assistance - Patient 24 - 49%     Upper Body Dressing/Undressing Upper body dressing   What is the patient wearing?: Pull over shirt    Upper body assist Assist Level: Moderate Assistance - Patient 50 - 74%    Lower Body Dressing/Undressing Lower body dressing      What is the patient wearing?: Incontinence brief,Pants     Lower body assist Assist for lower body dressing: Maximal Assistance -  Patient 25 - 49%     Toileting Toileting Toileting Activity did not occur Landscape architect and hygiene only): N/A (no void or bm)  Toileting assist Assist for toileting:  (not assessed at eval)     Transfers Chair/bed transfer  Transfers assist     Chair/bed transfer assist level: Moderate Assistance - Patient 50 - 74%     Locomotion Ambulation   Ambulation assist   Ambulation activity did not occur: Safety/medical concerns          Walk 10 feet activity   Assist  Walk 10 feet activity did not occur: Safety/medical concerns        Walk 50 feet activity   Assist Walk 50 feet with 2 turns activity did not occur: Safety/medical concerns         Walk 150 feet activity   Assist Walk 150 feet activity did not occur: Safety/medical concerns          Walk 10 feet on uneven surface  activity   Assist Walk 10 feet on uneven surfaces activity did not occur: Safety/medical concerns         Wheelchair     Assist Will patient use wheelchair at discharge?: Yes Type of Wheelchair: Manual    Wheelchair assist level: Dependent - Patient 0%      Wheelchair 50 feet with 2 turns activity    Assist        Assist Level: Dependent - Patient 0%   Wheelchair 150 feet activity     Assist      Assist Level: Dependent - Patient 0%   Blood pressure 137/68, pulse (!) (P) 53, temperature (P) 98.2 F (36.8 C), temperature source (P) Oral, resp. rate 14, height '6\' 2"'$  (1.88 m), weight 120.1 kg, SpO2 (P) 94 %.  Medical Problem List and Plan: 1.  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  Wound VAC as directed             -patient may  Shower once VAC off- and R BKA covered             -ELOS/Goals: 14-18 days- mod I to supervision  -Continue CIR 2.  Antithrombotics: -DVT/anticoagulation: SCDs left lower extremity             -antiplatelet therapy: Continue Aspirin 81 mg daily 3. Pain Management: Lyrica 50 mg 3 times daily, Robaxin and oxycodone as needed- add Duloxetine 20 mg QHS for nerve pain  2/27- no side effects so far- con't regimen  2/28: waking at night for pain medication. Add Oxycontin '10mg'$  BID, discussed how this works and using less PRN medication.   3/1: shrinker is painful for patient- discussed with Sarah OT applying ace wrap for now and I have ordered a new shrinker.  4. Mood: Provide emotional support             -antipsychotic agents: N/A 5. Neuropsych: This patient is capable of making decisions on his own behalf. 6. Skin/Wound Care: Routine skin checks. May d/c wound vac 2/28 7. Fluids/Electrolytes/Nutrition: Routine in and outs with follow-up chemistries 8.  Acute on chronic anemia.  Continue Niferex.    3/1: Hgb  up to 8.3, repeat Monday.  9.  AKI/ATN.  Follow-up nephrology services.  No current plan for hemodialysis- Current Cr 5.76  3/1: discussed etiologies of his ATN: Cr rising to 6, appreciate nephrology following, note reviewed. Repeat CMP tomorrow.  10.  Hypertension.  Hydralazine 50  mg twice daily, Norvasc 10 mg daily, clonidine 0.1 mg twice daily, Tenormin 50 mg daily.  Monitor with increased mobility  2/27- BP controlled- is slightly bradycardic- con't regimen for now 11.  Diabetes mellitus with peripheral neuropathy.  Hemoglobin A1c 10.5.  NovoLog 4 units 3 times daily, Lantus insulin 35 units nightly.  Check blood sugars before meals and at bedtime  2/27- BGs 127-160- con't regimen for now-  12.  Hyperlipidemia.  Lovaza 13.  Urinary retention with scrotal edema.  Flomax 0.4 mg daily.  Continue Foley tube for now until scrotal edema improves and then plan voiding trial.  2/27- elevating on hand towel- is working- also , Renal giving 80 mg IV Lasix x1 today- should also help LE edema/and scrotal edema.  14.  History of tobacco abuse.  Counseling 15. Protein malnutrition- severe- Alb 1.5- cause of edema likely 16. LE edema- severe- don't see Diuretic- likely due to Renal issues- elevating scrotum- con't regimen  2/27- see #13 17. Insomnia- try Trazodone 25-50 mg QHS for sleep   2/27- working- con't regimen 18. Transaminitis: tylenol d/ced, trending downward, repeat tomorrow. 19. Bradycardia: continue to monitor TID   35 minutes spent in discussion of patient's pain, review of his creatinine and hemoglobin and education of patient, discussion of team conference and providing patient with estimated d/c date today, discussing patient's pain with shrinker and compression sock with patient and his therapists, ordering new shrinker, discussion with pharmacy regarding pain medication options given patient's severe renal and mild liver damage, discussion of scrotal edema, >50% time spent in care and  coordination of care     LOS: 3 days A FACE TO Carlos 01/19/2021, 8:21 AM

## 2021-01-19 NOTE — Progress Notes (Signed)
Patient ID: Fernando Anthony, male   DOB: Mar 18, 1956, 65 y.o.   MRN: [id number] S: Continues to have right leg pain which limits his ability to cooperate with PT/OT O:BP 137/68   Pulse (!) (P) 53   Temp (P) 98.2 F (36.8 C) (Oral)   Resp 14   Ht '6\' 2"'$  (1.88 m)   Wt 120.1 kg   SpO2 (P) 94%   BMI 33.99 kg/m   Intake/Output Summary (Last 24 hours) at 01/19/2021 1301 Last data filed at 01/19/2021 1058 Gross per 24 hour  Intake 677 ml  Output 5650 ml  Net -4973 ml   Intake/Output: I/O last 3 completed shifts: In: 622 [P.O.:622] Out: 6110 [Urine:6100; Drains:10]  Intake/Output this shift:  Total I/O In: 500 [P.O.:500] Out: 1600 [Urine:1600] Weight change:  Gen: NAD CVS: bradycardic at 53 Resp: cta Abd: +BS, soft, NT/dn Ext: 1+ BLE edema, s/p RBKA with wound vac in place  Recent Labs  Lab 01/13/21 0242 01/14/21 0747 01/15/21 0155 01/16/21 0153 01/17/21 1217 01/18/21 0209 01/19/21 0426  NA 131* 134* 134* 132* 135 133* 133*  K 4.3 4.6 4.7 4.2 4.4 4.4 4.5  CL 104 107 105 105 108 105 104  CO2 16* 16* 18* 16* 16* 16* 18*  GLUCOSE 173* 64* 108* 101* 113* 144* 142*  BUN 67* 71* 70* 72* 73* 73* 75*  CREATININE 5.78* 5.98* 5.86* 5.76* 5.64* 5.73* 6.00*  ALBUMIN 1.5* 1.5* 1.5* 1.5* 1.5* 1.5* 1.6*  CALCIUM 7.7* 7.9* 8.1* 7.8* 7.9* 7.9* 8.0*  PHOS 6.7*  --   --  8.0* 7.6*  --   --   AST 135* 147* 160* 151*  --  65* 57*  ALT 114* 133* 141* 156*  --  121* 114*   Liver Function Tests: Recent Labs  Lab 01/16/21 0153 01/17/21 1217 01/18/21 0209 01/19/21 0426  AST 151*  --  65* 57*  ALT 156*  --  121* 114*  ALKPHOS 681*  --  603* 626*  BILITOT 3.5*  --  3.7* 3.8*  PROT 5.6*  --  5.5* 6.0*  ALBUMIN 1.5* 1.5* 1.5* 1.6*   No results for input(s): LIPASE, AMYLASE in the last 168 hours. No results for input(s): AMMONIA in the last 168 hours. CBC: Recent Labs  Lab 01/15/21 0155 01/16/21 0153 01/16/21 1859 01/18/21 0209 01/19/21 0426  WBC 10.3 9.4 11.3* 10.2 9.2  NEUTROABS  7.5 6.6  --  7.9*  --   HGB 8.0* 7.6* 9.4* 8.0* 8.3*  HCT 23.1* 20.9* 27.1* 22.1* 24.4*  MCV 88.2 87.1 88.6 89.1 90.7  PLT 230 222 232 217 232   Cardiac Enzymes: No results for input(s): CKTOTAL, CKMB, CKMBINDEX, TROPONINI in the last 168 hours. CBG: Recent Labs  Lab 01/18/21 1624 01/18/21 2043 01/19/21 0413 01/19/21 0614 01/19/21 1128  GLUCAP 76 118* 137* 154* 122*    Iron Studies: No results for input(s): IRON, TIBC, TRANSFERRIN, FERRITIN in the last 72 hours. Studies/Results: No results found. Marland Kitchen amLODipine  10 mg Oral Daily  . aspirin EC  81 mg Oral QHS  . atenolol  50 mg Oral Daily  . Chlorhexidine Gluconate Cloth  6 each Topical Daily  . cloNIDine  0.1 mg Oral BID  . docusate sodium  100 mg Oral BID  . hydrALAZINE  50 mg Oral BID  . insulin aspart  0-15 Units Subcutaneous TID WC  . insulin aspart  4 Units Subcutaneous TID WC  . insulin glargine  20 Units Subcutaneous Daily  . iron polysaccharides  150 mg Oral Daily  . omega-3 acid ethyl esters  1 g Oral Daily  . oxyCODONE  10 mg Oral Q12H  . pregabalin  50 mg Oral TID  . sevelamer carbonate  800 mg Oral TID WC  . tamsulosin  0.4 mg Oral QPC supper  . traZODone  25-50 mg Oral QHS    BMET    Component Value Date/Time   NA 133 (L) 01/19/2021 0426   K 4.5 01/19/2021 0426   CL 104 01/19/2021 0426   CO2 18 (L) 01/19/2021 0426   GLUCOSE 142 (H) 01/19/2021 0426   BUN 75 (H) 01/19/2021 0426   CREATININE 6.00 (H) 01/19/2021 0426   CALCIUM 8.0 (L) 01/19/2021 0426   GFRNONAA 10 (L) 01/19/2021 0426   GFRAA >60 11/29/2019 2137   CBC    Component Value Date/Time   WBC 9.2 01/19/2021 0426   RBC 2.69 (L) 01/19/2021 0426   HGB 8.3 (L) 01/19/2021 0426   HCT 24.4 (L) 01/19/2021 0426   PLT 232 01/19/2021 0426   MCV 90.7 01/19/2021 0426   MCH 30.9 01/19/2021 0426   MCHC 34.0 01/19/2021 0426   RDW 19.1 (H) 01/19/2021 0426   LYMPHSABS 1.1 01/18/2021 0209   MONOABS 1.0 01/18/2021 0209   EOSABS 0.2 01/18/2021 0209    BASOSABS 0.1 01/18/2021 0209    Assessment/Plan:  1.  AKI: b/l creat from mid 2021 was 1.09. Here creat 4.0 on admit 2/15, then peaked at 5.96 on 2/21. Suspect ATN but urinary retention as below. Renal ultrasound consistent with medical renal disease. Lisinopril, HCTZ dc'd.  Admitted to taking NSAIDs prior to admission and had been on multiple antibiotics over the past several weeks.  No indication for RRT at this time. Essentially stable but quite impaired and has tolerated lasix.   1. Gave dose of Lasix 80 mg IV x 1 01/17/21 with good response and likely reason for bump in BUN/Cr. 2. Stopped cymbalta with renal failure 3. No uremic symptoms.  4. Continue to follow UOP and Scr.  2. 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 Per orthopedics.  3. [id number] held. Acceptable control 4. Anemia- Secondary to chronic illness. Tsat low, holding IV Fe in the setting of active infection. AKI contributing as well. aranesp 40 mcg once on 2/23. On oral iron. PRBC's on 2/26 ordered  5. hyperphosphatemia - renal diet. Started renvela as binder  6. DM - Insulin per primary team.  7. Urinary retention: new per patient the last couple of weeks.Nowon flomax. Now with foley and continue same. Assess voiding trial soon - has been on flomax 8. Severe protein malnutrition - will need protein supplementation. 9. Disposition - cont with PT/OT 10. Abnormal LFT's - slowly improving.  Possible shock liver.    Donetta Potts, MD [employer] [phone]

## 2021-01-19 NOTE — Progress Notes (Signed)
Occupational Therapy Session Note  Patient Details  Name: Fernando Anthony MRN: [id number] Date of Birth: 03-20-1956  Today's Date: 01/19/2021 OT Individual Time: [id number] OT Individual Time Calculation (min): 46 min  and Today's Date: 01/19/2021 OT Missed Time: 14 Minutes Missed Time Reason: Pain   Short Term Goals: Week 1:  OT Short Term Goal 1 (Week 1): Pt will perform BSC/toilet transfer with Min A and LRAD OT Short Term Goal 2 (Week 1): Pt will perform sit > stand in prep for LB ADL with no more than Min A OT Short Term Goal 3 (Week 1): Pt will perform LB dress with AE PRN with no more than Mod A OT Short Term Goal 4 (Week 1): Pt will tolerate wearing shrinker for 2+ hours to decrease edema in residual limb  Skilled Therapeutic Interventions/Progress Updates:    Treatment session with focus on functional mobility and care for residual limb.  Pt received supine in bed reporting pain in residual limb and pain overnight due to swelling with shrinker wear.  Educated on need for compression and shaping and spoke with MD and RN therefore wrapped residual limb with Ace wrap in figure 8 pattern.  Therapist educated pt on purpose and technique of wrapping with pt able to tolerate ace wrap.  Pt reports nursing staff washing him overnight and changing shorts at bed level, therefore declining any bathing/dressing this session.  Completed bed mobility with supervision to come to sitting EOB.  Engaged in sit > stand x4 from elevated EOB with focus on anterior weight shifting and tolerance of standing.  Pt tolerated standing 30-45 seconds each bout.  Pt reports sensation of "blood rushing" in RLE when in standing which increases pain.  Pt returned to supine and boosted up in bed with supervision/setup.  Pt repositioned with yellow foam under RLE for improved positioning.  Pt missed 14 mins due to pain.  Therapy Documentation Precautions:  Precautions Precautions: Fall Precaution Comments: RLE wound vac/  swollen scotum Required Braces or Orthoses: Other Brace Other Brace: R residual limb guard and R shrinker- Pt still not wearing shrinker.  Says it is too painful due to swelling. Encouraged patient to wear shrinker but continues not to. Restrictions Weight Bearing Restrictions: Yes RLE Weight Bearing: Non weight bearing General: General OT Amount of Missed Time: 14 Minutes Vital Signs: Therapy Vitals Temp: (P) 98.2 F (36.8 C) Temp Source: (P) Oral Pulse Rate: (!) (P) 53 BP: 137/68 Patient Position (if appropriate): (P) Lying Oxygen Therapy SpO2: (P) 94 % O2 Device: (P) Room Air Pain: Pain Assessment Pain Scale: 0-10 Pain Score: 8  Pain Type: Acute pain Pain Location: Leg Pain Orientation: Right Pain Descriptors / Indicators: Aching;Discomfort Pain Frequency: (P) Constant Pain Onset: On-going Patients Stated Pain Goal: (P) 3 Pain Intervention(s): Medication (See eMAR) Multiple Pain Sites: (P) No   Therapy/Group: Individual Therapy  [name] 01/19/2021, 8:22 AM

## 2021-01-19 NOTE — Progress Notes (Addendum)
Physical Therapy Session Note  Patient Details  Name: Fernando Anthony MRN: [id number] Date of Birth: 1956-06-15  Today's Date: 01/19/2021 PT Individual Time: 1130-1200 PT Individual Time Calculation (min): 30 min   Short Term Goals: Week 1:  PT Short Term Goal 1 (Week 1): Pt will perform STS with min A and LRAD. PT Short Term Goal 2 (Week 1): Pt will propel wc  x 150 ft with supervision PT Short Term Goal 3 (Week 1): Pt will be independent with bed mobility. PT Short Term Goal 4 (Week 1): Pt will iniate gait training with LRAD. Week 2:    Week 3:     Skilled Therapeutic Interventions/Progress Updates:    [name]  Repositioning performed as needed, comfortable at end of session w/legrest up in recliner, pillow under limb, knee fully extended  Pt initially supine w/leg propped and agreeable to  30 min session w/focus on transfers and pt education.  Discussed lying flat to prevent hip and knee flexion contractures/suggested pt do this during tv commercial breaks for regular stretching.  Pt voiced understanding.   Pt supine to sit on edge of bed w/supervision using rail.  stand pivot transfer bed to recliner w/min assist for power up, cga for transfer using RW. Pt performed R knee TKEs x 10 w/hold in extension.  Pt w/multiple questions regarding amputation procedure, fitting for prosthesis, how prosthesis works/how to donn/typical schedule for healing/fitting, etc.  Pt education performed. Pt then w/questions regarding equipment oredering at DC.  Discussed procedure for wc if needed/also w ?s/discussed scooter/power options but pt notified this is only option if deemed medically necessary and dependent on functional progress/prosthetic process.  Pt also stated current wc in IPR was "tight" and requested larger wc.  Will discuss w/primary.  Pt left oob in recliner w/chair alarm set and needs in  reach.      Therapy Documentation Precautions:  Precautions Precautions: Fall Precaution Comments: RLE wound vac/ swollen scotum Required Braces or Orthoses: Other Brace Other Brace: R residual limb guard and R shrinker- Pt still not wearing shrinker.  Says it is too painful due to swelling. Encouraged patient to wear shrinker but continues not to. Restrictions Weight Bearing Restrictions: Yes RLE Weight Bearing: Non weight bearing    Therapy/Group: Individual Therapy  Callie Fielding, [place] 01/19/2021, 12:13 PM

## 2021-01-19 NOTE — Progress Notes (Signed)
Patient ID: Fernando Anthony, male   DOB: 28-Jul-1956, 65 y.o.   MRN: [id number]  Met with pt to inform of team conference goals-supervision-min assist level and target discharge date of 3/17. He is aware of his limitation from pain and fatigue. He is interested to see who he will void once cath is removed. Discussed home health and equipment will assess closer to discharge date. Will have wife come in for education prior to discharge home.

## 2021-01-19 NOTE — Progress Notes (Addendum)
MEDICATION RELATED CONSULT NOTE - FOLLOW UP   Pharmacy Consult for   advice for medications for better pain control given patient's severe kidney and mild liver impairment.    Allergies  Allergen Reactions  . Vicodin [Hydrocodone-Acetaminophen] Itching    Patient Measurements: Height: '6\' 2"'$  (188 cm) Weight:  (pillows and such removed from bed) IBW/kg (Calculated) : 82.2   Vital Signs: Temp: (P) 98.2 F (36.8 C) (03/01 0437) Temp Source: (P) Oral (03/01 0437) BP: 137/68 (03/01 0609) Pulse Rate: (P) 53 (03/01 0437) Intake/Output from previous day: 02/28 0701 - 03/01 0700 In: 472 [P.O.:472] Out: 4050 [Urine:4050] Intake/Output from this shift: Total I/O In: 680 [P.O.:680] Out: 1600 [Urine:1600]  Labs: Recent Labs    01/16/21 1859 01/17/21 1217 01/18/21 0209 01/19/21 0426  WBC 11.3*  --  10.2 9.2  HGB 9.4*  --  8.0* 8.3*  HCT 27.1*  --  22.1* 24.4*  PLT 232  --  217 232  CREATININE  --  5.64* 5.73* 6.00*  PHOS  --  7.6*  --   --   ALBUMIN  --  1.5* 1.5* 1.6*  PROT  --   --  5.5* 6.0*  AST  --   --  65* 57*  ALT  --   --  121* 114*  ALKPHOS  --   --  603* 626*  BILITOT  --   --  3.7* 3.8*   Estimated Creatinine Clearance: 17.4 mL/min (A) (by C-G formula based on SCr of 6 mg/dL (H)).   Medications:  Scheduled:  . amLODipine  10 mg Oral Daily  . aspirin EC  81 mg Oral QHS  . atenolol  50 mg Oral Daily  . Chlorhexidine Gluconate Cloth  6 each Topical Daily  . cloNIDine  0.1 mg Oral BID  . docusate sodium  100 mg Oral BID  . hydrALAZINE  50 mg Oral BID  . insulin aspart  0-15 Units Subcutaneous TID WC  . insulin aspart  4 Units Subcutaneous TID WC  . insulin glargine  20 Units Subcutaneous Daily  . iron polysaccharides  150 mg Oral Daily  . omega-3 acid ethyl esters  1 g Oral Daily  . oxyCODONE  10 mg Oral Q12H  . pregabalin  50 mg Oral TID  . sevelamer carbonate  800 mg Oral TID WC  . tamsulosin  0.4 mg Oral QPC supper  . 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  History of insulin-dependent diabetes, diabetic foot ulcer right with multiple procedures in the past, hypertension , hyperlipidemia.  Patient also developed acute kidney injury, acute liver failure> liver function tests have improved.    Pharmacy consulted today 01/19/21 for advise on pain management in patient with AKI and mild liver impairment.   CrCl estimated is 17 ml/min. Avoiding all nephrotoxins.  LFTs have improved, trending down. MD has note patient may have suffered mild shock liver secondary to acute infection and hypovolemia initially.   Current pain management:  Patient on Lyrica 50 mg TID from home that is continued and is currently at maximum dose for his renal impairment. Oxycodone immediate release 5-10 mg po q4h prn moderate pain, 10-15 mg q4hr prn severe pain.  Yesterday 2/28, added Oxycodone CR (controlled release tablet) '10mg'$  po every  12 hours scheduled. Methocarbamol '500mg'$  po or IV q6hprn muscle spasms.   Recommendation:  -Best additional option in patient with acute kidney injury, and mild liver impairment is Acetaminophen at lower dosage limited to = or less than 2000 mg /day.   Thus maximum dose would be APAP 500 mg po q6hr scheduled or q6h PRN breakthrough pain.  -Other options to consider would be Diclofenac topical gel which has limited systemic absorption (averages ~6 % of the oral form). Topical diclofenac has been well tolerated in patients of renal impairment when used at lowest effective dose for shortest dose duration in mild to moderate CKD.   -Gabapentin low dose 200-300 mg/day may be used for neuropathic pain in AKI with CrCl <30 ml/min.  It is not hepatically  metabolized.  -Lidocaine topical patch has low systemic absorption through intact skin, used for local pain.  No adjustment needed for hepatic impairment.  In renal impairment smaller areas of treatment are recommended by cutting the product into smaller size to application.      Nicole Cella, RPh Clinical Pharmacist  [phone] Please check AMION for all [place] phone numbers After 10:00 PM, call [place] [phone] 01/19/2021,2:20 PM

## 2021-01-19 NOTE — Progress Notes (Signed)
Occupational Therapy Session Note  Patient Details  Name: Fernando Anthony MRN: [id number] Date of Birth: August 26, 1956  Today's Date: 01/19/2021 OT Individual Time: [id number] OT Individual Time Calculation (min): 50 min  and Today's Date: 01/19/2021 OT Missed Time: 10 Minutes Missed Time Reason: Pain;Patient fatigue   Short Term Goals: Week 1:  OT Short Term Goal 1 (Week 1): Pt will perform BSC/toilet transfer with Min A and LRAD OT Short Term Goal 2 (Week 1): Pt will perform sit > stand in prep for LB ADL with no more than Min A OT Short Term Goal 3 (Week 1): Pt will perform LB dress with AE PRN with no more than Mod A OT Short Term Goal 4 (Week 1): Pt will tolerate wearing shrinker for 2+ hours to decrease edema in residual limb  Skilled Therapeutic Interventions/Progress Updates:    Treatment session with focus on care for residual limb, maintaining ROM, and bathroom equipment.  Pt received supine in bed asleep, but awoken by voice.  Pt reports having returned to bed ~30 mins ago from sitting up in recliner.  Pt reports fatigue from not sleeping well overnight, but agreeable to therapy at bed level. Discussed fit of new shrinker and continued recommendation to frequently assess residual limb.  Engaged in 2 sets of 10 bridging, hip abduction, and knee extension with focus on hip and knee mobility as needed for standing and transfers.  Therapist obtained drop arm BSC as pt reports unable to fit on standard 3 in1.  Discussed squat pivot vs stand pivot with toilet transfers and use of drop arm feature to increase safety with toilet transfers.  Pt reports too fatigued to complete transfers at this time, but appreciative of change in equipment.  Pt remained semi-reclined in bed with all needs in reach.  Therapy Documentation Precautions:  Precautions Precautions: Fall Precaution Comments: RLE wound vac/ swollen scotum Required Braces or Orthoses: Other Brace Other Brace: R residual limb guard and R  shrinker- Pt still not wearing shrinker.  Says it is too painful due to swelling. Encouraged patient to wear shrinker but continues not to. Restrictions Weight Bearing Restrictions: Yes RLE Weight Bearing: Non weight bearing General: General OT Amount of Missed Time: 10 Minutes Vital Signs: Therapy Vitals Temp: 98.5 F (36.9 C) Temp Source: Oral Pulse Rate: (!) 50 Resp: 17 BP: (!) 145/74 Patient Position (if appropriate): Lying Oxygen Therapy SpO2: 95 % O2 Device: Room Air Pain: Pain Assessment Pain Scale: 0-10 Pain Score: 5  Pain Type: Acute pain Pain Location: Leg Pain Orientation: Right Pain Descriptors / Indicators: Aching Pain Onset: On-going Pain Intervention(s): Medication (See eMAR)   Therapy/Group: Individual Therapy  [name] 01/19/2021, 3:35 PM

## 2021-01-19 NOTE — Plan of Care (Signed)
  Problem: Consults Goal: RH GENERAL PATIENT EDUCATION Description: See Patient Education module for education specifics. Outcome: Progressing Goal: Skin Care Protocol Initiated - if Braden Score 18 or less Description: If consults are not indicated, leave blank or document N/A Outcome: Progressing Goal: Nutrition Consult-if indicated Outcome: Progressing Goal: Diabetes Guidelines if Diabetic/Glucose > 140 Description: If diabetic or lab glucose is > 140 mg/dl - Initiate Diabetes/Hyperglycemia Guidelines & Document Interventions  Outcome: Progressing   Problem: RH BOWEL ELIMINATION Goal: RH STG MANAGE BOWEL WITH ASSISTANCE Description: STG Manage Bowel with supervision. Outcome: Progressing Goal: RH STG MANAGE BOWEL W/MEDICATION W/ASSISTANCE Description: STG Manage Bowel with Medication with mod I Assistance. Outcome: Progressing   Problem: RH BLADDER ELIMINATION Goal: RH STG MANAGE BLADDER WITH EQUIPMENT WITH ASSISTANCE Description: STG Manage Bladder With Equipment With max Assistance Outcome: Progressing   Problem: RH SKIN INTEGRITY Goal: RH STG SKIN FREE OF INFECTION/BREAKDOWN Description: Skin to remain free from breakdown with supervision assistance. Outcome: Progressing Goal: RH STG MAINTAIN SKIN INTEGRITY WITH ASSISTANCE Description: STG Maintain Skin Integrity With Mod I Assistance. Outcome: Progressing Goal: RH STG ABLE TO PERFORM INCISION/WOUND CARE W/ASSISTANCE Description: STG Able To Perform Incision/Wound Care With Mod I Assistance. Outcome: Progressing   Problem: RH SAFETY Goal: RH STG ADHERE TO SAFETY PRECAUTIONS W/ASSISTANCE/DEVICE Description: STG Adhere to Safety Precautions With Mod I Assistance/Device. Outcome: Progressing   Problem: RH PAIN MANAGEMENT Goal: RH STG PAIN MANAGED AT OR BELOW PT'S PAIN GOAL Description: Pain less than or equal to 2. Outcome: Progressing   Problem: RH KNOWLEDGE DEFICIT GENERAL Goal: RH STG INCREASE KNOWLEDGE OF SELF  CARE AFTER HOSPITALIZATION Description: Patient will demonstrate knowledge of medication management, dietary management, residual limb care with educational materials and handouts provided by staff. Outcome: Progressing   Problem: Consults Goal: RH LIMB LOSS PATIENT EDUCATION Description: Description: See Patient Education module for eduction specifics. Outcome: Progressing

## 2021-01-19 NOTE — Progress Notes (Signed)
Hypoglycemic Event  CBG: 58  Treatment: 4 oz juice/soda  Symptoms: None  Follow-up CBG: [id number] CBG Result:82  Possible Reasons for Event: Unknown  Comments/MD notified:Protocol followed x1. Raulkar sent secure chat message    [place], [place]

## 2021-01-19 NOTE — Patient Care Conference (Signed)
Inpatient RehabilitationTeam Conference and Plan of Care Update Date: 01/19/2021   Time: 09:41 AM    Patient Name: Fernando Anthony      Medical Record Number: [id number]  Date of Birth: 1956/08/02 Sex: Male         Room/Bed: 5C09C/5C09C-01 Payor Info: Payor: [place] / Plan: BCBS COMM PPO / Product Type: *No Product type* /    Admit Date/Time:  01/16/2021  6:13 PM  Primary Diagnosis:  Right below-knee amputee [facility])  Hospital Problems: Principal Problem:   Right below-knee amputee [facility]) Active Problems:   AKI (acute kidney injury) ([place])   Severe protein-calorie malnutrition [facility])    Expected Discharge Date: Expected Discharge Date: 02/04/21  Team Members Present: Physician leading conference: Dr. Leeroy Cha Care Coodinator Present: Dorthula Nettles, RN, BSN, CRRN;Becky Dupree, LCSW Nurse Present: Dwaine Gale, RN PT Present: Becky Sax, PT OT Present: [name], OT PPS Coordinator present : Gunnar Fusi, SLP     Current Status/Progress Goal Weekly Team Focus  Bowel/Bladder   foley in place for bladder obstruction,  LBM 2/27 strict I&O  Pt will have foley remove and voiding trial started. Pt will remain continent of Bowel.  foley care, CHG bath, PRN laxatives for constipation; educate pt on importance of hydration and nutrition to prevent constipation.   Swallow/Nutrition/ Hydration             ADL's   Mod assist transfers with RW, Mod A sit > stand, Mod A UB dressing, Max A LB dressing  Supervision bathing and dressing, Min A LB dressing, Mod I toileting - may need to downgrade toileting and toilet transfers to Supervision  ADL retraining, sit > stand, transfers, residual limb care, pain management, endurance   Mobility   Mod A transfers with RW for power up to stand and power up. Min A bed mobility  mod I  functional mobility/transfers, generalized strengthening, dynamic standing balance/coordination, amputee education, endurance   Communication              Safety/Cognition/ Behavioral Observations            Pain   Pt has a pain level of >8 being controlled with oxycodone, ER oxycontin, and robaxin.  Pt pain level with be below pain goal of 4.  assess pain per shift, PRN pain meds, elevate BLE   Skin   Pt has +2 edema ( BUE, BLE, Perineal, Sacrum)  R BKA with staples ([place] compression sock)  Pt will have decrease edema, incision on R BKA will remain free of infection and not develop new skin breakdown.  assess skin per shift, monitor incision for s/sx of infection, elevate extremities, monitor creatinine/BUN levels-edema, educate pt on ways to decrease edema.     Discharge Planning:  Home with wife who can work from home for a short time, so can provide assist when discharged from [place].   Team Discussion: Severe kidney injury, Cr trending up, Scrotal edema, elevated LST's. Pain worse when with therapy, better when lying down. BP well controlled, Hgb trending up. Continent bowel, foley to continue due to bladder obstruction. Pain is biggest issue, not been lower than 6/10. CBG's are good. Discharging home with wife who plans to work from home. Patient on target to meet rehab goals: Mod assist to get up, feels like blood is rushing to bottom of residual limb. Concerned about side of incision, reports it looks like a "dog ear". Patient has Mod I goals. Will continue to educate on  residual limb care and desensitizing.  *See Care Plan and progress notes for long and short-term goals.   Revisions to Treatment Plan:  Added long acting Oxycontin to pain medications.  Teaching Needs: Family education, medication management, skin/wound care, transfer training, gait training, pain management, residual limb care, desensitation, residual limb protection, endurance training.  Current Barriers to Discharge: Inaccessible home environment, Decreased caregiver support, Home enviroment access/layout, Wound care, Weight bearing restrictions and  Behavior  Possible Resolutions to Barriers: Continue current medications, foley care, weight bearing precautions, provide emotional support.     Medical Summary Current Status: Pain is severe, BP elevated, foley in place, wound vac removed yesterday and incision is healing well, Cr severely elevated  Barriers to Discharge: Medical stability;Wound care  Barriers to Discharge Comments: Pain is severe, BP elevated, foley in place, s/p amputation, Cr severely elevated Possible Resolutions to [employer] Focus: Added long acting oxycontin, cannot increase further given severe kindey dysfunction, monitor Cr daily, pharm consult for alternative pain options givne kidney and liver dysfunction   Continued Need for Acute Rehabilitation Level of Care: The patient requires daily medical management by a physician with specialized training in physical medicine and rehabilitation for the following reasons: Direction of a multidisciplinary physical rehabilitation program to maximize functional independence : Yes Medical management of patient stability for increased activity during participation in an intensive rehabilitation regime.: Yes Analysis of laboratory values and/or radiology reports with any subsequent need for medication adjustment and/or medical intervention. : Yes   I attest that I was present, lead the team conference, and concur with the assessment and plan of the team.   Cristi Loron 01/19/2021, 1:35 PM

## 2021-01-19 NOTE — Progress Notes (Signed)
Orthopedic Tech Progress Note Patient Details:  Fernando Anthony Mar 21, 1956 [id number] Called in order to [employer] for a better fitting SHRINKER for a BKA Patient ID: Fernando Anthony, male   DOB: 08/21/1956, 65 y.o.   MRN: [id number]   [name] 01/19/2021, 9:36 AM

## 2021-01-19 NOTE — Progress Notes (Signed)
Physical Therapy Session Note  Patient Details  Name: Fernando Anthony MRN: [id number] Date of Birth: 07/14/1956  Today's Date: 01/19/2021 PT Individual Time: [id number] PT Individual Time Calculation (min): 41 min   Short Term Goals: Week 1:  PT Short Term Goal 1 (Week 1): Pt will perform STS with min A and LRAD. PT Short Term Goal 2 (Week 1): Pt will propel wc  x 150 ft with supervision PT Short Term Goal 3 (Week 1): Pt will be independent with bed mobility. PT Short Term Goal 4 (Week 1): Pt will iniate gait training with LRAD.  Skilled Therapeutic Interventions/Progress Updates:   Received pt semi-reclined in bed, pt agreeable to therapy, and reported pain 8/10 in R residual limb. RN present to administer pain medication during session. Session with emphasis on functional mobility/transfers, generalized strengthening, dynamic standing balance/coordination, and improved activity tolerance. Pt transferred semi-reclined<>sitting EOB with supervision with HOB elevated and transferred sit<>stand with RW and mod A x 2 trials. Pt performed 2x10 R hip flexion standing with BUE support and CGA. Returned to sitting due to reports of "blood pooling at bottom of residual limb" and performed 2x10 seated R knee extension with less discomfort. MD present for morning rounds to discuss change in pain medications. MD ordering new shrinker and compression sock due to tightness of current ones. Pt requested to lie down and performed lateral scoots to R to [facility] with supervision and transferred sit<>supine with supervision. Doffed tight black compression sock from Dr. Christena Flake and donned white hospital ted hose and non-skid sock with total A (with clearance from MD). Pt performed the following exercises supine in bed with supervision and verbal cues for technique: -single leg bridges x8 -RLE SLR 2x10 -RLE hip abduction 2x10 -LLE SAQ 2x12 -L ankle circles x20 clockwise/counterclockwise Pt required cues for pursed lip  breathing throughout session to avoid Valsalva. Pt reported 7/10 fatigue and 7/10 pain post-exercise. Concluded session with pt semi-reclined in bed, needs within reach, and bed alarm on with R residual limb elevated.   Therapy Documentation Precautions:  Precautions Precautions: Fall Precaution Comments: RLE wound vac/ swollen scotum Required Braces or Orthoses: Other Brace Other Brace: R residual limb guard and R shrinker- Pt still not wearing shrinker.  Says it is too painful due to swelling. Encouraged patient to wear shrinker but continues not to. Restrictions Weight Bearing Restrictions: Yes RLE Weight Bearing: Non weight bearing  Therapy/Group: Individual Therapy Alfonse Alpers PT, DPT   01/19/2021, 7:23 AM

## 2021-01-20 DIAGNOSIS — Z89511 Acquired absence of right leg below knee: Secondary | ICD-10-CM | POA: Diagnosis not present

## 2021-01-20 LAB — COMPREHENSIVE METABOLIC PANEL
ALT: 104 U/L — ABNORMAL HIGH (ref 0–44)
AST: 59 U/L — ABNORMAL HIGH (ref 15–41)
Albumin: 1.5 g/dL — ABNORMAL LOW (ref 3.5–5.0)
Alkaline Phosphatase: 554 U/L — ABNORMAL HIGH (ref 38–126)
Anion gap: 9 (ref 5–15)
BUN: 77 mg/dL — ABNORMAL HIGH (ref 8–23)
CO2: 18 mmol/L — ABNORMAL LOW (ref 22–32)
Calcium: 7.8 mg/dL — ABNORMAL LOW (ref 8.9–10.3)
Chloride: 107 mmol/L (ref 98–111)
Creatinine, Ser: 5.89 mg/dL — ABNORMAL HIGH (ref 0.61–1.24)
GFR, Estimated: 10 mL/min — ABNORMAL LOW (ref >60)
Glucose, Bld: 135 mg/dL — ABNORMAL HIGH (ref 70–99)
Potassium: 4.5 mmol/L (ref 3.5–5.1)
Sodium: 134 mmol/L — ABNORMAL LOW (ref 135–145)
Total Bilirubin: 3.6 mg/dL — ABNORMAL HIGH (ref 0.3–1.2)
Total Protein: 5.7 g/dL — ABNORMAL LOW (ref 6.5–8.1)

## 2021-01-20 LAB — GLUCOSE, CAPILLARY
Glucose-Capillary: 126 mg/dL — ABNORMAL HIGH (ref 70–99)
Glucose-Capillary: 96 mg/dL (ref 70–99)
Glucose-Capillary: 61 mg/dL — ABNORMAL LOW (ref 70–99)
Glucose-Capillary: 68 mg/dL — ABNORMAL LOW (ref 70–99)
Glucose-Capillary: 103 mg/dL — ABNORMAL HIGH (ref 70–99)
Glucose-Capillary: 130 mg/dL — ABNORMAL HIGH (ref 70–99)

## 2021-01-20 NOTE — Progress Notes (Signed)
Physical Therapy Session Note  Patient Details  Name: Fernando Anthony MRN: [id number] Date of Birth: [birth year]  Today's Date: 01/20/2021 PT Individual Time: [id number] PT Individual Time Calculation (min): 55 min   Short Term Goals: Week 1:  PT Short Term Goal 1 (Week 1): Pt will perform STS with min A and LRAD. PT Short Term Goal 2 (Week 1): Pt will propel wc  x 150 ft with supervision PT Short Term Goal 3 (Week 1): Pt will be independent with bed mobility. PT Short Term Goal 4 (Week 1): Pt will iniate gait training with LRAD.  Skilled Therapeutic Interventions/Progress Updates:   Received pt supine in bed, pt agreeable to therapy, and reported pain 4/10 in R residual limb. RN present to administer medications. Repositioning, rest breaks, and distraction done to reduce pain levels. Session with emphasis on functional mobility/transfers, generalized strengthening, dynamic standing balance/coordination, and improved activity tolerance. Pt transferred semi-reclined<>sitting EOB  with supervision and transferred bed<>WC stand<>pivot with RW and min A. Readjusted shrinker for improved fit and comfort and pt performed WC mobility >551f using BUE, increased time, and supervision with 4 rest breaks due to UE fatigue down to 4W dayroom. Pt required extensive rest break afterwards then performed seated LLE strengthening on Kinetron at 20 cm/sec for 1 minute x 1 trial and at 10 cm/sec for 1 minute x 2 trials with therapist providing manual counter resistance with emphasis on glute/quad strengthening and core control. Pt reported increased pain in R residual limb. Therapist applied towels and wrapped co-band around amputee pad for improved elevation and pt reported immediate pain relief. Pt transported back to room on 5C in [place] and transferred WC<>recliner stand<>pivot with RW and min A. Pt reported 7/10 fatigue at end of session. Concluded session with pt sitting in recliner, needs within reach, and chair  pad alarm on. R residual limb elevated for pain control and edema management.   Therapy Documentation Precautions:  Precautions Precautions: Fall Precaution Comments: RLE wound vac/ swollen scotum Required Braces or Orthoses: Other Brace Other Brace: R residual limb guard and R shrinker- Pt still not wearing shrinker.  Says it is too painful due to swelling. Encouraged patient to wear shrinker but continues not to. Restrictions Weight Bearing Restrictions: Yes RLE Weight Bearing: Non weight bearing  Therapy/Group: Individual Therapy AAlfonse AlpersPT, DPT   01/20/2021, 7:16 AM

## 2021-01-20 NOTE — Progress Notes (Signed)
Occupational Therapy Session Note  Patient Details  Name: RUNE WESBY MRN: [id number] Date of Birth: [birth year]  Today's Date: 01/20/2021 OT Individual Time: [id number] OT Individual Time Calculation (min): 55 min    Short Term Goals: Week 1:  OT Short Term Goal 1 (Week 1): Pt will perform BSC/toilet transfer with Min A and LRAD OT Short Term Goal 2 (Week 1): Pt will perform sit > stand in prep for LB ADL with no more than Min A OT Short Term Goal 3 (Week 1): Pt will perform LB dress with AE PRN with no more than Mod A OT Short Term Goal 4 (Week 1): Pt will tolerate wearing shrinker for 2+ hours to decrease edema in residual limb  Skilled Therapeutic Interventions/Progress Updates:    Treatment session with focus on functional transfers and care of residual limb. Pt received semi-reclined in bed with shrinker off.  Pt reports some pain earlier in day and RN removing shrinker.  Agreeable to donning it again prior to getting OOB.  Therapist donned over residual limb with pt able to pull up over knee once over sutures.  Therapist also applied limb guard prior to transferring OOB.  Pt completed bed mobility with supervision. Educated on lateral scoot/squat pivot transfers.  Pt able to complete lateral scoot transfer bed > w/c with mod assist.  Therapist positioned w/c in bathroom next to commode seat to practice transfers, as pt reporting still having difficulty with toileting on BSC due to male anatomy and size of the seat.  Pt completed lateral scoot transfer min assist w/c <> commode with use of grab bar.  Discussed either lateral leans or sit > stand for clothing management with toileting.  Pt transferred to ADL tubroom to practice tub/shower transfers.  Pt able to complete lateral scoot with mod assist due to slight incline when transferring to tub bench, but able to complete transfer back to w/c with min assist.  Discussed recommendation of tub transfer bench for home with pt in agreement.  Pt  returned to room via w/c for BUE strengthening.  Pt returned to bed min assist lateral scoot and returned to semi-reclined with pillow under RLE for improved positioning/edema management.   Therapy Documentation Precautions:  Precautions Precautions: Fall Precaution Comments: RLE wound vac/ swollen scotum Required Braces or Orthoses: Other Brace Other Brace: R residual limb guard and R shrinker- Pt still not wearing shrinker.  Says it is too painful due to swelling. Encouraged patient to wear shrinker but continues not to. Restrictions Weight Bearing Restrictions: Yes RLE Weight Bearing: Non weight bearing General:   Vital Signs: Therapy Vitals Temp: 97.6 F (36.4 C) Temp Source: Oral Pulse Rate: (!) 51 Resp: 17 BP: (!) 154/75 Patient Position (if appropriate): Lying Oxygen Therapy SpO2: 94 % O2 Device: Room Air Pain: Pain Assessment Pain Scale: 0-10 Pain Score: 6  Pain Type: Surgical pain Pain Location: Incision Pain Orientation: Right Pain Descriptors / Indicators: Tender;Throbbing Pain Frequency: Constant Pain Onset: On-going Pain Intervention(s): Medication (See eMAR)   Therapy/Group: Individual Therapy  [name] 01/20/2021, 4:02 PM

## 2021-01-20 NOTE — Progress Notes (Signed)
Pt gave a pain score of 7-8. RN proceeded to give '10mg'$  of oxy due to giving pt tylenol an hour ago. Pt refused the '10mg'$  and wanted 15 mg of oxy. RN gave '15mg'$  of oxy due to pt request. Order states to give '10mg'$ -'15mg'$  of oxy for a pain level of 7-10. Pt stable at this time. RN will continue to monitor.

## 2021-01-20 NOTE — Progress Notes (Signed)
TED Hose on. Pt stable at this time. RN will continue to monitor.

## 2021-01-20 NOTE — Progress Notes (Signed)
PROGRESS NOTE   Subjective/Complaints: Fernando Anthony complains of pain this morning. Continues to be present in residual limb. Discussed with him pharmacy's recommendations and starting Tylenol q8H '650mg'$ . Spoke with his wife by phone and she informed me that he has a history of opioid addiction and has been on methadone and suboxone in past.   ROS:  Pt denies SOB, abd pain, CP, N/V/C/D, and vision changes, +residual limb pain, urinary retention   Objective:   No results found. Recent Labs    01/18/21 0209 01/19/21 0426  WBC 10.2 9.2  HGB 8.0* 8.3*  HCT 22.1* 24.4*  PLT 217 232   Recent Labs    01/19/21 0426 01/20/21 0430  NA 133* 134*  K 4.5 4.5  CL 104 107  CO2 18* 18*  GLUCOSE 142* 135*  BUN 75* 77*  CREATININE 6.00* 5.89*  CALCIUM 8.0* 7.8*    Intake/Output Summary (Last 24 hours) at 01/20/2021 1335 Last data filed at 01/20/2021 1300 Gross per 24 hour  Intake 295 ml  Output 2825 ml  Net -2530 ml        Physical Exam: Vital Signs Blood pressure (!) 154/75, pulse (!) 51, temperature 97.6 F (36.4 C), temperature source Oral, resp. rate 17, height '6\' 2"'$  (1.88 m), weight 120.1 kg, SpO2 94 %.  Physical Exam Gen: no distress, normal appearing HEENT: oral mucosa pink and moist, NCAT Cardio: Bradycardic Chest: normal effort, normal rate of breathing Abd: soft, non-distended Ext: no edema Psych: pleasant, normal affect Neuro: Musculoskeletal:    Comments: has foley, but scrotum is smaller- medium grapefruit with a few more normal wrinkles of scrotum.  Musculoskeletal:     Cervical back: Normal range of motion. No rigidity.     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 Also wearing a knee immobilizer Mild swelling of forearms and swelling  to groin B/L- 3-4+ pitting edema B/L Buttocks look OK Has a few tiny heat rash spots on back  Neuro: Decreased sensation to light touch from mid calf downwards RLE- knee downwards is decreased greatly     Assessment/Plan: 1. Functional deficits which require 3+ hours per day of interdisciplinary therapy in a comprehensive inpatient rehab setting.  Physiatrist is providing close team supervision and 24 hour management of active medical problems listed below.  Physiatrist and rehab team continue to assess barriers to discharge/monitor patient progress toward functional and medical goals  Care Tool:  Bathing    Body parts bathed by patient: Right arm,Left arm,Chest,Abdomen,Right upper leg,Left upper leg,Face   Body parts bathed by helper: Left lower leg,Buttocks,Front perineal area Body parts n/a: Right lower leg   Bathing assist Assist Level: Maximal Assistance - Patient 24 - 49%     Upper Body Dressing/Undressing Upper body dressing   What is the patient wearing?: Pull over shirt    Upper body assist Assist Level: Moderate Assistance - Patient 50 - 74%    Lower Body Dressing/Undressing Lower body dressing  What is the patient wearing?: Incontinence brief,Pants     Lower body assist Assist for lower body dressing: Maximal Assistance - Patient 25 - 49%     Toileting Toileting Toileting Activity did not occur (Clothing management and hygiene only): N/A (no void or bm)  Toileting assist Assist for toileting:  (not assessed at eval)     Transfers Chair/bed transfer  Transfers assist     Chair/bed transfer assist level: Minimal Assistance - Patient > 75%     Locomotion Ambulation   Ambulation assist   Ambulation activity did not occur: Safety/medical concerns          Walk 10 feet activity   Assist  Walk 10 feet activity did not occur: Safety/medical concerns        Walk 50 feet activity   Assist Walk 50 feet with 2 turns activity did  not occur: Safety/medical concerns         Walk 150 feet activity   Assist Walk 150 feet activity did not occur: Safety/medical concerns         Walk 10 feet on uneven surface  activity   Assist Walk 10 feet on uneven surfaces activity did not occur: Safety/medical concerns         Wheelchair     Assist Will patient use wheelchair at discharge?: Yes Type of Wheelchair: Manual    Wheelchair assist level: Supervision/Verbal cueing Max wheelchair distance: 177f    Wheelchair 50 feet with 2 turns activity    Assist        Assist Level: Supervision/Verbal cueing   Wheelchair 150 feet activity     Assist      Assist Level: Supervision/Verbal cueing   Blood pressure (!) 154/75, pulse (!) 51, temperature 97.6 F (36.4 C), temperature source Oral, resp. rate 17, height '6\' 2"'$  (1.88 m), weight 120.1 kg, SpO2 94 %.  Medical Problem List and Plan: 1.  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  Wound VAC as directed             -patient may  Shower once VAC off- and R BKA covered             -ELOS/Goals: 14-18 days- mod I to supervision  -Continue CIR 2.  Antithrombotics: -DVT/anticoagulation: SCDs left lower extremity             -antiplatelet therapy: Continue Aspirin 81 mg daily 3. Pain Management: Lyrica 50 mg 3 times daily, Robaxin and oxycodone as needed- add Duloxetine 20 mg QHS for nerve pain  2/27- no side effects so far- con't regimen  2/28: waking at night for pain medication. Add Oxycontin '10mg'$  BID, discussed how this works and using less PRN medication.   3/1: shrinker is painful for patient- discussed with Fernando Anthony applying ace wrap for now and I have ordered a new shrinker.   3/2: Stop log-acting oxycodone given that it did not help significantly and because patient has history of opioid addiction 4. Mood: Provide emotional support              -antipsychotic agents: N/A 5. Neuropsych: This patient is capable of making decisions on his own behalf. 6. Skin/Wound Care: Routine skin checks. May d/c wound vac 2/28 7. Fluids/Electrolytes/Nutrition: Routine in and outs with follow-up chemistries 8.  Acute on chronic anemia.  Continue Niferex.    3/2: hgb down to 7.8, repeat tomorrow.  9.  AKI/ATN.  Follow-up nephrology services.  No current plan for hemodialysis- Current Cr 5.76  3/2: discussed etiologies of his ATN: Cr down to 5.89, appreciate nephrology following, note reviewed. Repeat CMP tomorrow.  10.  Hypertension.  Hydralazine 50 mg twice daily, Norvasc 10 mg daily, clonidine 0.1 mg twice daily, Tenormin 50 mg daily.  Monitor with increased mobility  2/27- BP controlled- is slightly bradycardic- con't regimen for now 11.  Diabetes mellitus with peripheral neuropathy.  Hemoglobin A1c 10.5.  NovoLog 4 units 3 times daily, Lantus insulin 35 units nightly.  Check blood sugars before meals and at bedtime  2/27- BGs 127-160- con't regimen for now-  12.  Hyperlipidemia.  Lovaza 13.  Urinary retention with scrotal edema.  Flomax 0.4 mg daily.  Continue Foley tube for now until scrotal edema improves and then plan voiding trial.  2/27- elevating on hand towel- is working- also , Renal giving 80 mg IV Lasix x1 today- should also help LE edema/and scrotal edema.  14.  History of tobacco abuse.  Counseling 15. Protein malnutrition- severe- Alb 1.5- cause of edema likely 16. LE edema- severe- don't see Diuretic- likely due to Renal issues- elevating scrotum- con't regimen  2/27- see #13 17. Insomnia- try Trazodone 25-50 mg QHS for sleep   2/27- working- con't regimen 18. Transaminitis: tylenol d/ced, trending downward, repeat tomorrow. 19. Bradycardia: continue to monitor TID   35 minutes spent in discussion of pain with patient and wife, discussion of pharmacy consult recommendations and starting Tylenol '650mg'$  TID, discussion with wife about  patient's history of opioid addiction/non-narcotic pain management options/challenges with alternative medications given his kidney and liver impairments/plan to wean off opioid medication prior to discharge, stopping long acting oxycontin since did not given benefit last night or today.      LOS: 4 days A FACE TO FACE EVALUATION WAS PERFORMED  Martha Clan P Gamble Enderle 01/20/2021, 1:35 PM

## 2021-01-20 NOTE — Significant Event (Addendum)
Hypoglycemic Event  CBG: 68  Treatment: 4 oz juice/soda  Symptoms: None  Follow-up CBG: [id number] CBG Result:61  Possible Reasons for Event: Unknown  Comments/MD notified:Protocol followed, additional protein/carb intake as well as dinner, will recheck. At 1816 CBG was [place]

## 2021-01-20 NOTE — Progress Notes (Signed)
Patient ID: PIER YOOS, male   DOB: 01-02-1956, 65 y.o.   MRN: [id number] S: No new complaints O:BP (!) 151/77 (BP Location: Left Arm)   Pulse (!) 53   Temp 98.5 F (36.9 C)   Resp 14   Ht '6\' 2"'$  (1.88 m)   Wt 120.1 kg   SpO2 94%   BMI 33.99 kg/m   Intake/Output Summary (Last 24 hours) at 01/20/2021 1252 Last data filed at 01/20/2021 [id number] Gross per 24 hour  Intake 475 ml  Output 2275 ml  Net -1800 ml   Intake/Output: I/O last 3 completed shifts: In: [id number] [P.O.:857] Out: 4700 [Urine:4700]  Intake/Output this shift:  Total I/O In: 118 [P.O.:118] Out: 425 [Urine:425] Weight change:  Gen: NAD CVS: bradycardic at 53, no rub Resp: cta Abd: benign Ext: 1+ BLE edema, s/p RBKA with wound vac.  Recent Labs  Lab 01/14/21 0747 01/15/21 0155 01/16/21 0153 01/17/21 1217 01/18/21 0209 01/19/21 0426 01/20/21 0430  NA 134* 134* 132* 135 133* 133* 134*  K 4.6 4.7 4.2 4.4 4.4 4.5 4.5  CL 107 105 105 108 105 104 107  CO2 16* 18* 16* 16* 16* 18* 18*  GLUCOSE 64* 108* 101* 113* 144* 142* 135*  BUN 71* 70* 72* 73* 73* 75* 77*  CREATININE 5.98* 5.86* 5.76* 5.64* 5.73* 6.00* 5.89*  ALBUMIN 1.5* 1.5* 1.5* 1.5* 1.5* 1.6* 1.5*  CALCIUM 7.9* 8.1* 7.8* 7.9* 7.9* 8.0* 7.8*  PHOS  --   --  8.0* 7.6*  --   --   --   AST 147* 160* 151*  --  65* 57* 59*  ALT 133* 141* 156*  --  121* 114* 104*   Liver Function Tests: Recent Labs  Lab 01/18/21 0209 01/19/21 0426 01/20/21 0430  AST 65* 57* 59*  ALT 121* 114* 104*  ALKPHOS 603* 626* 554*  BILITOT 3.7* 3.8* 3.6*  PROT 5.5* 6.0* 5.7*  ALBUMIN 1.5* 1.6* 1.5*   No results for input(s): LIPASE, AMYLASE in the last 168 hours. No results for input(s): AMMONIA in the last 168 hours. CBC: Recent Labs  Lab 01/15/21 0155 01/16/21 0153 01/16/21 1859 01/18/21 0209 01/19/21 0426  WBC 10.3 9.4 11.3* 10.2 9.2  NEUTROABS 7.5 6.6  --  7.9*  --   HGB 8.0* 7.6* 9.4* 8.0* 8.3*  HCT 23.1* 20.9* 27.1* 22.1* 24.4*  MCV 88.2 87.1 88.6 89.1 90.7  PLT  230 222 232 217 232   Cardiac Enzymes: No results for input(s): CKTOTAL, CKMB, CKMBINDEX, TROPONINI in the last 168 hours. CBG: Recent Labs  Lab 01/19/21 1620 01/19/21 1655 01/19/21 2101 01/20/21 0621 01/20/21 1134  GLUCAP 58* 82 118* 126* 96    Iron Studies: No results for input(s): IRON, TIBC, TRANSFERRIN, FERRITIN in the last 72 hours. Studies/Results: No results found. Marland Kitchen acetaminophen  650 mg Oral Q8H  . amLODipine  10 mg Oral Daily  . aspirin EC  81 mg Oral QHS  . atenolol  50 mg Oral Daily  . Chlorhexidine Gluconate Cloth  6 each Topical Daily  . cloNIDine  0.1 mg Oral BID  . docusate sodium  100 mg Oral BID  . hydrALAZINE  50 mg Oral BID  . insulin aspart  0-15 Units Subcutaneous TID WC  . insulin aspart  3 Units Subcutaneous TID WC  . insulin glargine  20 Units Subcutaneous Daily  . iron polysaccharides  150 mg Oral Daily  . omega-3 acid ethyl esters  1 g Oral Daily  . oxyCODONE  10  mg Oral Q12H  . pregabalin  50 mg Oral TID  . sevelamer carbonate  800 mg Oral TID WC  . tamsulosin  0.4 mg Oral QPC supper  . traZODone  25-50 mg Oral QHS    BMET    Component Value Date/Time   NA 134 (L) 01/20/2021 0430   K 4.5 01/20/2021 0430   CL 107 01/20/2021 0430   CO2 18 (L) 01/20/2021 0430   GLUCOSE 135 (H) 01/20/2021 0430   BUN 77 (H) 01/20/2021 0430   CREATININE 5.89 (H) 01/20/2021 0430   CALCIUM 7.8 (L) 01/20/2021 0430   GFRNONAA 10 (L) 01/20/2021 0430   GFRAA >60 11/29/2019 2137   CBC    Component Value Date/Time   WBC 9.2 01/19/2021 0426   RBC 2.69 (L) 01/19/2021 0426   HGB 8.3 (L) 01/19/2021 0426   HCT 24.4 (L) 01/19/2021 0426   PLT 232 01/19/2021 0426   MCV 90.7 01/19/2021 0426   MCH 30.9 01/19/2021 0426   MCHC 34.0 01/19/2021 0426   RDW 19.1 (H) 01/19/2021 0426   LYMPHSABS 1.1 01/18/2021 0209   MONOABS 1.0 01/18/2021 0209   EOSABS 0.2 01/18/2021 0209   BASOSABS 0.1 01/18/2021 0209     Assessment/Plan:  1. AKI: b/l creat from mid 2021 was  1.09. Here creat 4.0 on admit 2/15, then peaked at 5.96 on 2/21. Suspect ATN but urinary retention as below. Renal ultrasound consistent with medical renal disease. Lisinopril, HCTZ dc'd. Admitted to taking NSAIDs prior to admission and had been on multiple antibiotics over the past several weeks. No indication for RRT at this time. Essentially stable but quite impaired and has tolerated lasix.  1. Gave dose of Lasix 80 mg IV x 1 01/17/21 with good response and likely reason for bump in BUN/Cr. 2. Stoppedcymbalta with renal failure 3. No uremic symptoms.  4. Continue to follow UOP and Scr. 2. 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 Per orthopedics.  3. [id number] held. Acceptable control 4. Anemia- Secondary to chronic illness. Tsat low, holding IV Fe in the setting of active infection. AKI contributing as well. aranesp 40 mcg once on 2/23. On oral iron. PRBC's on 2/26 ordered  5. hyperphosphatemia - renal diet. Started renvela as binder  6. DM - Insulin per primary team.  7. Urinary retention: new per patient the last couple of weeks.Nowon flomax. Now with foley and continue same. Assess voiding trial soon - has been on flomax 8. Severe protein malnutrition - will need protein supplementation. 9. Disposition - cont with PT/OT 10. Abnormal LFT's - slowly improving. Possible shock liver.   Donetta Potts, MD [employer] [phone]

## 2021-01-21 DIAGNOSIS — Z89511 Acquired absence of right leg below knee: Secondary | ICD-10-CM | POA: Diagnosis not present

## 2021-01-21 LAB — COMPREHENSIVE METABOLIC PANEL
ALT: 106 U/L — ABNORMAL HIGH (ref 0–44)
AST: 56 U/L — ABNORMAL HIGH (ref 15–41)
Albumin: 1.7 g/dL — ABNORMAL LOW (ref 3.5–5.0)
Alkaline Phosphatase: 669 U/L — ABNORMAL HIGH (ref 38–126)
Anion gap: 12 (ref 5–15)
BUN: 79 mg/dL — ABNORMAL HIGH (ref 8–23)
CO2: 19 mmol/L — ABNORMAL LOW (ref 22–32)
Calcium: 8.3 mg/dL — ABNORMAL LOW (ref 8.9–10.3)
Chloride: 106 mmol/L (ref 98–111)
Creatinine, Ser: 5.79 mg/dL — ABNORMAL HIGH (ref 0.61–1.24)
GFR, Estimated: 10 mL/min — ABNORMAL LOW (ref >60)
Glucose, Bld: 112 mg/dL — ABNORMAL HIGH (ref 70–99)
Potassium: 5 mmol/L (ref 3.5–5.1)
Sodium: 137 mmol/L (ref 135–145)
Total Bilirubin: 3.8 mg/dL — ABNORMAL HIGH (ref 0.3–1.2)
Total Protein: 6.4 g/dL — ABNORMAL LOW (ref 6.5–8.1)

## 2021-01-21 LAB — GLUCOSE, CAPILLARY
Glucose-Capillary: 134 mg/dL — ABNORMAL HIGH (ref 70–99)
Glucose-Capillary: 89 mg/dL (ref 70–99)
Glucose-Capillary: 90 mg/dL (ref 70–99)
Glucose-Capillary: 132 mg/dL — ABNORMAL HIGH (ref 70–99)

## 2021-01-21 LAB — HEMOGLOBIN AND HEMATOCRIT, BLOOD
HCT: 23.7 % — ABNORMAL LOW (ref 39.0–52.0)
Hemoglobin: 8.3 g/dL — ABNORMAL LOW (ref 13.0–17.0)

## 2021-01-21 MED ORDER — OXYCODONE HCL 5 MG PO TABS
10.0000 mg | ORAL_TABLET | ORAL | Status: DC | PRN
  Administered 2021-01-21 – 2021-01-26 (×19): 10 mg via ORAL
  Filled 2021-01-21 (×14): qty 2

## 2021-01-21 MED ORDER — HYDROCORTISONE 1 % EX CREA
TOPICAL_CREAM | Freq: Every evening | CUTANEOUS | Status: DC | PRN
  Filled 2021-01-21: qty 28

## 2021-01-21 MED ORDER — WITCH HAZEL-GLYCERIN EX PADS
MEDICATED_PAD | CUTANEOUS | Status: DC | PRN
  Filled 2021-01-21 (×2): qty 100

## 2021-01-21 MED ORDER — INSULIN ASPART 100 UNIT/ML ~~LOC~~ SOLN
2.0000 [IU] | Freq: Three times a day (TID) | SUBCUTANEOUS | Status: DC
  Administered 2021-01-22 – 2021-01-25 (×11): 2 [IU] via SUBCUTANEOUS

## 2021-01-21 NOTE — Progress Notes (Signed)
Patient's wife said she saw bedsores on patient's right thigh. Examined and found small abrasions, like pimples that had been scratched. Educated patient about signs, location, and progression of bedsores. No skin breakdown noted. Verbalized understanding.

## 2021-01-21 NOTE — Progress Notes (Signed)
Occupational Therapy Session Note  Patient Details  Name: Fernando Anthony MRN: [id number] Date of Birth: Apr 21, 1956  Today's Date: 01/21/2021 OT Individual Time: 1000-1040 OT Individual Time Calculation (min): 40 min    Short Term Goals: Week 1:  OT Short Term Goal 1 (Week 1): Pt will perform BSC/toilet transfer with Min A and LRAD OT Short Term Goal 2 (Week 1): Pt will perform sit > stand in prep for LB ADL with no more than Min A OT Short Term Goal 3 (Week 1): Pt will perform LB dress with AE PRN with no more than Mod A OT Short Term Goal 4 (Week 1): Pt will tolerate wearing shrinker for 2+ hours to decrease edema in residual limb Week 2:     Skilled Therapeutic Interventions/Progress Updates:    1:1  Pt in bed when arrived reporting pain in residual limb. Discussed different pain relief methods including icing incision, repositioning, desensitization  etc with pt and pt's wife when she arrived. Pt arrived to transfer to the toilet in the bathroom with OT's help but wanted wife to assist with clothing management and hygiene (with total A ). Pt required mod A to transfers with low clearance from bed to w/c and then w/c to toilet with grab bar. Pt was able to perform transfer off toilet with min A with more clearance as well as return to EOB. Pt was given opportunity to participate in bathing and dressing at sink level but wife and pt reported pain was too high and wanted to return to bed and have wife assist with bathing at bed level.   Left resting in bed with wife present.     Therapy Documentation Precautions:  Precautions Precautions: Fall Precaution Comments: RLE wound vac/ swollen scotum Required Braces or Orthoses: Other Brace Other Brace: R residual limb guard and R shrinker- Pt still not wearing shrinker.  Says it is too painful due to swelling. Encouraged patient to wear shrinker but continues not to. Restrictions Weight Bearing Restrictions: Yes RLE Weight Bearing: Non weight  bearing General: General OT Amount of Missed Time: 20 Minutes Pain: Pain Assessment Pain Scale: 0-10 Pain Score: 7  Pain Type: Surgical pain Pain Location: Incision Pain Orientation: Right Pain Descriptors / Indicators: Throbbing;Shooting;Stabbing Pain Frequency: Constant Pain Onset: On-going Pain Intervention(s): Medication (See eMAR); applied ice to residual limb to assist with pain and repositioning.    Therapy/Group: Individual Therapy  Willeen Cass [facility] 01/21/2021, 2:14 PM

## 2021-01-21 NOTE — Progress Notes (Signed)
Patient ID: DELANE CREHAN, male   DOB: [birth year], 65 y.o.   MRN: [id number] S: Complaining of pain at incision site of RBKA O:BP (!) 148/70   Pulse 63   Temp 99.2 F (37.3 C) (Oral)   Resp 16   Ht '6\' 2"'$  (1.88 m)   Wt 120.1 kg   SpO2 94%   BMI 33.99 kg/m   Intake/Output Summary (Last 24 hours) at 01/21/2021 1227 Last data filed at 01/21/2021 [id number] Gross per 24 hour  Intake 297 ml  Output 2900 ml  Net -2603 ml   Intake/Output: I/O last 3 completed shifts: In: 34 [P.O.:472] Out: [id number] [Urine:3925]  Intake/Output this shift:  Total I/O In: 120 [P.O.:120] Out: -  Weight change:  Gen: NAD CVS: RRR Resp: cta Abd: benign Ext: trace LLE edema, s/p RBKA staples intact, no edema  Recent Labs  Lab 01/15/21 0155 01/16/21 0153 01/17/21 1217 01/18/21 0209 01/19/21 0426 01/20/21 0430 01/21/21 0253  NA 134* 132* 135 133* 133* 134* 137  K 4.7 4.2 4.4 4.4 4.5 4.5 5.0  CL 105 105 108 105 104 107 106  CO2 18* 16* 16* 16* 18* 18* 19*  GLUCOSE 108* 101* 113* 144* 142* 135* 112*  BUN 70* 72* 73* 73* 75* 77* 79*  CREATININE 5.86* 5.76* 5.64* 5.73* 6.00* 5.89* 5.79*  ALBUMIN 1.5* 1.5* 1.5* 1.5* 1.6* 1.5* 1.7*  CALCIUM 8.1* 7.8* 7.9* 7.9* 8.0* 7.8* 8.3*  PHOS  --  8.0* 7.6*  --   --   --   --   AST 160* 151*  --  65* 57* 59* 56*  ALT 141* 156*  --  121* 114* 104* 106*   Liver Function Tests: Recent Labs  Lab 01/19/21 0426 01/20/21 0430 01/21/21 0253  AST 57* 59* 56*  ALT 114* 104* 106*  ALKPHOS 626* 554* 669*  BILITOT 3.8* 3.6* 3.8*  PROT 6.0* 5.7* 6.4*  ALBUMIN 1.6* 1.5* 1.7*   No results for input(s): LIPASE, AMYLASE in the last 168 hours. No results for input(s): AMMONIA in the last 168 hours. CBC: Recent Labs  Lab 01/15/21 0155 01/16/21 0153 01/16/21 1859 01/18/21 0209 01/19/21 0426 01/21/21 0253  WBC 10.3 9.4 11.3* 10.2 9.2  --   NEUTROABS 7.5 6.6  --  7.9*  --   --   HGB 8.0* 7.6* 9.4* 8.0* 8.3* 8.3*  HCT 23.1* 20.9* 27.1* 22.1* 24.4* 23.7*  MCV 88.2 87.1  88.6 89.1 90.7  --   PLT 230 222 232 217 232  --    Cardiac Enzymes: No results for input(s): CKTOTAL, CKMB, CKMBINDEX, TROPONINI in the last 168 hours. CBG: Recent Labs  Lab 01/20/21 1635 01/20/21 1659 01/20/21 1816 01/20/21 2116 01/21/21 0654  GLUCAP 68* 61* 103* 130* 134*    Iron Studies: No results for input(s): IRON, TIBC, TRANSFERRIN, FERRITIN in the last 72 hours. Studies/Results: No results found. Marland Kitchen acetaminophen  650 mg Oral Q8H  . amLODipine  10 mg Oral Daily  . aspirin EC  81 mg Oral QHS  . atenolol  50 mg Oral Daily  . Chlorhexidine Gluconate Cloth  6 each Topical Daily  . cloNIDine  0.1 mg Oral BID  . docusate sodium  100 mg Oral BID  . hydrALAZINE  50 mg Oral BID  . insulin aspart  0-15 Units Subcutaneous TID WC  . insulin aspart  2 Units Subcutaneous TID WC  . insulin glargine  20 Units Subcutaneous Daily  . iron polysaccharides  150 mg Oral Daily  .  omega-3 acid ethyl esters  1 g Oral Daily  . pregabalin  50 mg Oral TID  . sevelamer carbonate  800 mg Oral TID WC  . tamsulosin  0.4 mg Oral QPC supper  . traZODone  25-50 mg Oral QHS    BMET    Component Value Date/Time   NA 137 01/21/2021 0253   K 5.0 01/21/2021 0253   CL 106 01/21/2021 0253   CO2 19 (L) 01/21/2021 0253   GLUCOSE 112 (H) 01/21/2021 0253   BUN 79 (H) 01/21/2021 0253   CREATININE 5.79 (H) 01/21/2021 0253   CALCIUM 8.3 (L) 01/21/2021 0253   GFRNONAA 10 (L) 01/21/2021 0253   GFRAA >60 11/29/2019 2137   CBC    Component Value Date/Time   WBC 9.2 01/19/2021 0426   RBC 2.69 (L) 01/19/2021 0426   HGB 8.3 (L) 01/21/2021 0253   HCT 23.7 (L) 01/21/2021 0253   PLT 232 01/19/2021 0426   MCV 90.7 01/19/2021 0426   MCH 30.9 01/19/2021 0426   MCHC 34.0 01/19/2021 0426   RDW 19.1 (H) 01/19/2021 0426   LYMPHSABS 1.1 01/18/2021 0209   MONOABS 1.0 01/18/2021 0209   EOSABS 0.2 01/18/2021 0209   BASOSABS 0.1 01/18/2021 0209     Assessment/Plan:  1. AKI: b/l creat from mid 2021 was  1.09. Here creat 4.0 on admit 2/15, then peaked at 5.96 on 2/21. Suspect ATN but urinary retention as below. Renal ultrasound consistent with medical renal disease. Lisinopril, HCTZ dc'd. Admitted to taking NSAIDs prior to admission and had been on multiple antibiotics over the past several weeks. No indication for RRT at this time. Essentially stable but quite impaired and has tolerated lasix.  1. Gave dose of Lasix 80 mg IV x 12/27/22with good response 2. Stoppedcymbalta with renal failure 3. No uremic symptoms. 4. BUN/Cr slowly improving over the past 2 days. 5. Continue to follow UOP and Scr. 2. 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 Per orthopedics.  3. [id number] held. Acceptable control 4. Anemia- Secondary to chronic illness. Tsat low, holding IV Fe in the setting of active infection. AKI contributing as well. aranesp 40 mcg once on 2/23. On oral iron. PRBC's on 2/26 ordered  5. hyperphosphatemia - renal diet. Started renvela as binder  6. DM - Insulin per primary team.  7. Urinary retention: new per patient the last couple of weeks.Nowon flomax. Now with foley and continue same. Assess voiding trial soon - has been on flomax 8. Severe protein malnutrition - will need protein supplementation. 9. Disposition - cont with PT/OT 10. Abnormal LFT's - slowly improving. Possible shock liver.   Donetta Potts, MD [employer] [phone]

## 2021-01-21 NOTE — Progress Notes (Signed)
PROGRESS NOTE   Subjective/Complaints: Hypoglycemic event to 68- mealtime insulin decreased to 2U.  Hgb stable at 8.3 Cr trending down to 5.79   ROS:  Pt denies SOB, abd pain, CP, N/V/C/D, and vision changes, +residual limb pain, urinary retention   Objective:   No results found. Recent Labs    01/19/21 0426 01/21/21 0253  WBC 9.2  --   HGB 8.3* 8.3*  HCT 24.4* 23.7*  PLT 232  --    Recent Labs    01/20/21 0430 01/21/21 0253  NA 134* 137  K 4.5 5.0  CL 107 106  CO2 18* 19*  GLUCOSE 135* 112*  BUN 77* 79*  CREATININE 5.89* 5.79*  CALCIUM 7.8* 8.3*    Intake/Output Summary (Last 24 hours) at 01/21/2021 0904 Last data filed at 01/21/2021 0729 Gross per 24 hour  Intake 474 ml  Output 2900 ml  Net -2426 ml        Physical Exam: Vital Signs Blood pressure (!) 148/70, pulse 63, temperature 99.2 F (37.3 C), temperature source Oral, resp. rate 16, height '6\' 2"'$  (1.88 m), weight 120.1 kg, SpO2 94 %.  Physical Exam Gen: no distress, normal appearing HEENT: oral mucosa pink and moist, NCAT Cardio: Reg rate Chest: normal effort, normal rate of breathing Abd: soft, non-distended Ext: no edema Psych: pleasant, normal affect Skin: intact Neuro: Musculoskeletal:    Comments: has foley, but scrotum is smaller- medium grapefruit with a few more normal wrinkles of scrotum.  Musculoskeletal:     Cervical back: Normal range of motion. No rigidity.     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 Also wearing a knee immobilizer Mild swelling of forearms and swelling to groin B/L- 3-4+ pitting edema B/L Buttocks look OK Has a few tiny heat rash spots on back  Neuro: Decreased sensation to light touch from mid calf downwards RLE- knee downwards is  decreased greatly     Assessment/Plan: 1. Functional deficits which require 3+ hours per day of interdisciplinary therapy in a comprehensive inpatient rehab setting.  Physiatrist is providing close team supervision and 24 hour management of active medical problems listed below.  Physiatrist and rehab team continue to assess barriers to discharge/monitor patient progress toward functional and medical goals  Care Tool:  Bathing    Body parts bathed by patient: Right arm,Left arm,Chest,Abdomen,Right upper leg,Left upper leg,Face   Body parts bathed by helper: Left lower leg,Buttocks,Front perineal area Body parts n/a: Right lower leg   Bathing assist Assist Level: Maximal Assistance - Patient 24 - 49%     Upper Body Dressing/Undressing Upper body dressing   What is the patient wearing?: Pull over shirt    Upper body assist Assist Level: Moderate Assistance - Patient 50 - 74%    Lower Body Dressing/Undressing Lower body dressing      What is the patient wearing?: Incontinence brief,Pants     Lower body assist Assist for lower body dressing: Maximal Assistance - Patient  25 - 49%     Toileting Toileting Toileting Activity did not occur Landscape architect and hygiene only): N/A (no void or bm)  Toileting assist Assist for toileting:  (not assessed at eval)     Transfers Chair/bed transfer  Transfers assist     Chair/bed transfer assist level: Minimal Assistance - Patient > 75%     Locomotion Ambulation   Ambulation assist   Ambulation activity did not occur: Safety/medical concerns          Walk 10 feet activity   Assist  Walk 10 feet activity did not occur: Safety/medical concerns        Walk 50 feet activity   Assist Walk 50 feet with 2 turns activity did not occur: Safety/medical concerns         Walk 150 feet activity   Assist Walk 150 feet activity did not occur: Safety/medical concerns         Walk 10 feet on uneven  surface  activity   Assist Walk 10 feet on uneven surfaces activity did not occur: Safety/medical concerns         Wheelchair     Assist Will patient use wheelchair at discharge?: Yes Type of Wheelchair: Manual    Wheelchair assist level: Supervision/Verbal cueing Max wheelchair distance: 132f    Wheelchair 50 feet with 2 turns activity    Assist        Assist Level: Supervision/Verbal cueing   Wheelchair 150 feet activity     Assist      Assist Level: Supervision/Verbal cueing   Blood pressure (!) 148/70, pulse 63, temperature 99.2 F (37.3 C), temperature source Oral, resp. rate 16, height '6\' 2"'$  (1.88 m), weight 120.1 kg, SpO2 94 %.  Medical Problem List and Plan: 1.  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  Wound VAC as directed             -patient may  Shower once VAC off- and R BKA covered             -ELOS/Goals: 14-18 days- mod I to supervision  -Continue CIR 2.  Antithrombotics: -DVT/anticoagulation: SCDs left lower extremity             -antiplatelet therapy: Continue Aspirin 81 mg daily 3. Pain Management: Lyrica 50 mg 3 times daily, Robaxin and oxycodone as needed- add Duloxetine 20 mg QHS for nerve pain  2/27- no side effects so far- con't regimen  2/28: waking at night for pain medication. Add Oxycontin '10mg'$  BID, discussed how this works and using less PRN medication.   3/1: shrinker is painful for patient- discussed with Sarah OT applying ace wrap for now and I have ordered a new shrinker.   3/2: Stop log-acting oxycodone given that it did not help significantly and because patient has history of opioid addiction  3/3: Please try TENS and mirror therapy- will message therapy. Will discuss with Dr. DJess Bartersteam nerve block and nitroglycerin patch as options.  4. Mood: Provide emotional support             -antipsychotic agents: N/A 5. Neuropsych: This  patient is capable of making decisions on his own behalf. 6. Skin/Wound Care: Routine skin checks. May d/c wound vac 2/28 7. Fluids/Electrolytes/Nutrition: Routine in and outs with follow-up chemistries 8.  Acute on chronic anemia.  Continue Niferex.    3/3: Hgb reviewed, 8.3, repeat Monday  9.  AKI/ATN.  Follow-up nephrology services.  No current plan for hemodialysis- Current Cr 5.76  3/3: discussed etiologies of his ATN: Cr down to 5.79, appreciate nephrology following, note reviewed. Repeat CMP tomorrow.  10.  Hypertension.  Hydralazine 50 mg twice daily, Norvasc 10 mg daily, clonidine 0.1 mg twice daily, Tenormin 50 mg daily.  Monitor with increased mobility  3/3- BP controlled- is slightly bradycardic-continue regimen for now 11.  Diabetes mellitus with peripheral neuropathy.  Hemoglobin A1c 10.5.  NovoLog 4 units 3 times daily, Lantus insulin 35 units nightly.  Check blood sugars before meals and at bedtime  2/27- BGs 127-160- con't regimen for now-  12.  Hyperlipidemia.  Lovaza 13.  Urinary retention with scrotal edema.  Flomax 0.4 mg daily.  Continue Foley tube for now until scrotal edema improves and then plan voiding trial.  2/27- elevating on hand towel- is working- also , Renal giving 80 mg IV Lasix x1 today- should also help LE edema/and scrotal edema.  14.  History of tobacco abuse.  Counseling 15. Protein malnutrition- severe- Alb 1.5- cause of edema likely 16. LE edema- severe- don't see Diuretic- likely due to Renal issues- elevating scrotum- con't regimen  2/27- see #13 17. Insomnia- try Trazodone 25-50 mg QHS for sleep   2/27- working- con't regimen 18. Transaminitis: tylenol d/ced, trending downward, repeat tomorrow. 19. Bradycardia: continue to monitor TID       LOS: 5 days A FACE TO FACE EVALUATION WAS PERFORMED  Fernando Anthony 01/21/2021, 9:04 AM

## 2021-01-21 NOTE — Progress Notes (Signed)
Physical Therapy Session Note  Patient Details  Name: Fernando Anthony MRN: [id number] Date of Birth: 1956/05/26  Today's Date: 01/21/2021 PT Individual Time: [id number] PT Individual Time Calculation (min): 33 min  Today's Date: 01/21/2021 PT Missed Time: 96 Minutes Missed Time Reason: Pain  Short Term Goals: Week 1:  PT Short Term Goal 1 (Week 1): Pt will perform STS with min A and LRAD. PT Short Term Goal 2 (Week 1): Pt will propel wc  x 150 ft with supervision PT Short Term Goal 3 (Week 1): Pt will be independent with bed mobility. PT Short Term Goal 4 (Week 1): Pt will iniate gait training with LRAD.  Skilled Therapeutic Interventions/Progress Updates:   Received pt semi-reclined in bed reporting pain 9/10 in R residual limb and grimacing. RN notified but reported pt not due for more pain medication yet. Pt reported MD starting weaning him off of his heavy pain medications starting this morning. Pt reported getting OOB earlier today but stated the pain is increasing, especially around the incision line, and gets worse whenever he is in any position where his limb isn't elevated. Therapist encouraged desensitization, guided imagery, and deep breathing however pt appeared too fixated on pain and politely declined any OOB mobility. Removed blankets from over top of R residual limb as pt hypersensitive to pressure; pt appreciative. Therapist offered bed level exercises, however pt continued to refuse stating "if it involves moving, I just can't do it." Pt was agreeable to therapist providing HEP and pt agreed to complete some exercises later this afternoon if his pain decreases. Therapist provided pt HEP and educated pt on frequency/duration/technique for the following exercises: -Supine Single Leg Bridge with Sound Leg (AKA) - 1 x daily - 7 x weekly - 2 -sets - 8 reps -Supine Straight Leg Raises - 1 x daily - 7 x weekly - 2 sets - 10 reps -Supine Hip Abduction - 1 x daily - 7 x weekly - 2 sets - 10  reps -Supine Ankle Circles - 1 x daily - 7 x weekly - 2 sets - 20 reps -Supine Gluteal Sets - 1 x daily - 7 x weekly - 2 sets - 10 reps - 5 hold Concluded session with pt semi-reclined in bed, needs within reach, and bed alarm on. 42 minutes missed of skilled physical therapy due to pain.   Therapy Documentation Precautions:  Precautions Precautions: Fall Precaution Comments: RLE wound vac/ swollen scotum Required Braces or Orthoses: Other Brace Other Brace: R residual limb guard and R shrinker- Pt still not wearing shrinker.  Says it is too painful due to swelling. Encouraged patient to wear shrinker but continues not to. Restrictions Weight Bearing Restrictions: Yes RLE Weight Bearing: Non weight bearing  Therapy/Group: Individual Therapy Alfonse Alpers PT, DPT   01/21/2021, 7:29 AM

## 2021-01-21 NOTE — Progress Notes (Signed)
Late entry: Therapy notified nurse that per wife, pt bleeding when wiping. Asked wife if hemorrhoids were external or internal. She was unsure. Asked pt if okay to check and he started to turn over and wife said that he isn't going to let you look and to just get some tucks and preparation H. Informed that if internal will need suppository form. She informed me that he isn't going to do that. Called PA and received new orders.

## 2021-01-21 NOTE — Progress Notes (Signed)
Wife at bedside, requested she do CHG and Foley care. Educated both on CHG and foley care. Wife performed both but still needs reinforcement with foley care.

## 2021-01-21 NOTE — Progress Notes (Signed)
Pt refusing CHG wipe as well as foley wipes.

## 2021-01-21 NOTE — Progress Notes (Signed)
Occupational Therapy Session Note  Patient Details  Name: Fernando Anthony MRN: [id number] Date of Birth: 1956-05-01  Today's Date: 01/21/2021 OT Individual Time: 1530-1540 OT Individual Time Calculation (min): 10 min    Short Term Goals: Week 1:  OT Short Term Goal 1 (Week 1): Pt will perform BSC/toilet transfer with Min A and LRAD OT Short Term Goal 2 (Week 1): Pt will perform sit > stand in prep for LB ADL with no more than Min A OT Short Term Goal 3 (Week 1): Pt will perform LB dress with AE PRN with no more than Mod A OT Short Term Goal 4 (Week 1): Pt will tolerate wearing shrinker for 2+ hours to decrease edema in residual limb  Skilled Therapeutic Interventions/Progress Updates:    1:1. Pt received in bed with "a lot" of pain in residual limb. OT educates on various pain management strategies: mirror therapy, progressive muscle relaxation, guided imagery, and meditation- specifically pendulation. Pt verbalized willingness to attempt variety of methods for pain management, but declines participation today despite encouragement. Exited session with pt seated in bed, exit alarm on and call light in reach   Therapy Documentation Precautions:  Precautions Precautions: Fall Precaution Comments: RLE wound vac/ swollen scotum Required Braces or Orthoses: Other Brace Other Brace: R residual limb guard and R shrinker- Pt still not wearing shrinker.  Says it is too painful due to swelling. Encouraged patient to wear shrinker but continues not to. Restrictions Weight Bearing Restrictions: Yes RLE Weight Bearing: Non weight bearing General: General OT Amount of Missed Time: 20 Minutes PT Missed Treatment Reason: Pain Vital Signs: Therapy Vitals Temp: 98.5 F (36.9 C) Temp Source: Oral Pulse Rate: 63 Resp: 16 BP: (!) 149/73 Patient Position (if appropriate): Lying Oxygen Therapy SpO2: 98 % O2 Device: Room Air Pain: Pain Assessment Pain Scale: 0-10 Pain Score: 8  Pain Type:  Surgical pain Pain Location: Incision Pain Intervention(s): Medication (See eMAR) ADL: ADL Eating: Not assessed Grooming: Not assessed Upper Body Bathing: Contact guard Lower Body Bathing: Maximal assistance Upper Body Dressing: Moderate assistance Where Assessed-Upper Body Dressing: Bed level Lower Body Dressing: Maximal assistance Where Assessed-Lower Body Dressing: Bed level Toileting: Not assessed (pt declined) Toilet Transfer: Not assessed Vision   Perception    Praxis   Exercises:   Other Treatments:     Therapy/Group: Individual Therapy  Tonny Branch 01/21/2021, 4:06 PM

## 2021-01-22 ENCOUNTER — Telehealth: Payer: Self-pay

## 2021-01-22 DIAGNOSIS — Z89511 Acquired absence of right leg below knee: Secondary | ICD-10-CM | POA: Diagnosis not present

## 2021-01-22 LAB — COMPREHENSIVE METABOLIC PANEL
ALT: 110 U/L — ABNORMAL HIGH (ref 0–44)
AST: 77 U/L — ABNORMAL HIGH (ref 15–41)
Albumin: 1.7 g/dL — ABNORMAL LOW (ref 3.5–5.0)
Alkaline Phosphatase: 602 U/L — ABNORMAL HIGH (ref 38–126)
Anion gap: 9 (ref 5–15)
BUN: 79 mg/dL — ABNORMAL HIGH (ref 8–23)
CO2: 16 mmol/L — ABNORMAL LOW (ref 22–32)
Calcium: 8.1 mg/dL — ABNORMAL LOW (ref 8.9–10.3)
Chloride: 110 mmol/L (ref 98–111)
Creatinine, Ser: 5.85 mg/dL — ABNORMAL HIGH (ref 0.61–1.24)
GFR, Estimated: 10 mL/min — ABNORMAL LOW (ref >60)
Glucose, Bld: 107 mg/dL — ABNORMAL HIGH (ref 70–99)
Potassium: 4.7 mmol/L (ref 3.5–5.1)
Sodium: 135 mmol/L (ref 135–145)
Total Bilirubin: 4.5 mg/dL — ABNORMAL HIGH (ref 0.3–1.2)
Total Protein: 6.1 g/dL — ABNORMAL LOW (ref 6.5–8.1)

## 2021-01-22 LAB — GLUCOSE, CAPILLARY
Glucose-Capillary: 92 mg/dL (ref 70–99)
Glucose-Capillary: 104 mg/dL — ABNORMAL HIGH (ref 70–99)
Glucose-Capillary: 61 mg/dL — ABNORMAL LOW (ref 70–99)
Glucose-Capillary: 91 mg/dL (ref 70–99)
Glucose-Capillary: 101 mg/dL — ABNORMAL HIGH (ref 70–99)

## 2021-01-22 MED ORDER — ATENOLOL 25 MG PO TABS
25.0000 mg | ORAL_TABLET | Freq: Every day | ORAL | Status: AC
  Administered 2021-01-22: 25 mg via ORAL

## 2021-01-22 MED ORDER — CLONIDINE HCL 0.2 MG PO TABS
0.2000 mg | ORAL_TABLET | Freq: Two times a day (BID) | ORAL | Status: DC
  Administered 2021-01-22 – 2021-01-28 (×12): 0.2 mg via ORAL
  Filled 2021-01-22 (×12): qty 1

## 2021-01-22 NOTE — Progress Notes (Signed)
Patient apical pulse 50, B/P 168/77. Patient ordered Atenolol '50mg'$  qhs. Kirsteins MD notified. Order to hold Atenolol '50mg'$  x1 and give Atenolol '25mg'$  X1.

## 2021-01-22 NOTE — Progress Notes (Signed)
Orthopedic Tech Progress Note Patient Details:  Fernando Anthony Aug 21, 1956 [id number] TENS UNIT is done by THERAPY not ORTHO Patient ID: Fernando Anthony, male   DOB: 04-29-1956, 65 y.o.   MRN: [id number]   [name] 01/22/2021, 1:12 PM

## 2021-01-22 NOTE — Progress Notes (Addendum)
Occupational Therapy Session Note  Patient Details  Name: Fernando Anthony MRN: [id number] Date of Birth: 12-18-1955  Today's Date: 01/22/2021 OT Individual Time: [id number] Individual Time Calculation (min): 24 min  and Today's Date: 01/22/2021 OT Missed Time: 36 Minutes Missed Time Reason: Pain   Short Term Goals: Week 1:  OT Short Term Goal 1 (Week 1): Pt will perform BSC/toilet transfer with Min A and LRAD OT Short Term Goal 2 (Week 1): Pt will perform sit > stand in prep for LB ADL with no more than Min A OT Short Term Goal 3 (Week 1): Pt will perform LB dress with AE PRN with no more than Mod A OT Short Term Goal 4 (Week 1): Pt will tolerate wearing shrinker for 2+ hours to decrease edema in residual limb  Skilled Therapeutic Interventions/Progress Updates:    Treatment session with focus on pain management and care for residual limb.  Pt received semi-reclined in bed stating "I'm not getting up" as he reports increased pain in residual limb when in dependent position.  Pt reports doing HEP provided pt PT frequently for strengthening and pain management.  Pt states MD changing him off some pain meds and planning to attempt IV pain meds to get pain under control while not elevating kidney/liver numbers.  Therapist introduced mirror therapy for pain management, attempting to set up with tabletop mirror.  However due to poor positioning and not appropriate mirror, activity proved to be difficult for pt.  Pt reports pain in residual limb and shrinker rolling down at thigh causing discomfort.  Therapist doffed gray shrinker and reapplied black shrinker (which is shorter) to maintain compression while not promoting swelling and discomfort in thigh.  Pt requested to remain semi-reclined in bed as he again reports OOB or EOB mobility increases his pain to intolerable level.  Pt remained semi-reclined in bed with all needs in reach.  Pt missed 36 mins due to pain.  Therapy Documentation Precautions:   Precautions Precautions: Fall Precaution Comments: RLE wound vac/ swollen scotum Required Braces or Orthoses: Other Brace Other Brace: R residual limb guard and R shrinker- Pt still not wearing shrinker.  Says it is too painful due to swelling. Encouraged patient to wear shrinker but continues not to. Restrictions Weight Bearing Restrictions: Yes RLE Weight Bearing: Non weight bearing General: General OT Amount of Missed Time: 36 Minutes PT Missed Treatment Reason: Pain Pain: Pain Assessment Pain Scale: 0-10 Pain Score: 8  Pain Type: Surgical pain Pain Location: Leg Pain Orientation: Right Pain Descriptors / Indicators: Stabbing;Throbbing Pain Frequency: Intermittent Pain Onset: On-going Patients Stated Pain Goal: 3 Pain Intervention(s): Medication (See eMAR)   Therapy/Group: Individual Therapy  [name] 01/22/2021, 1:47 PM

## 2021-01-22 NOTE — Progress Notes (Signed)
Patient ID: Fernando Anthony, male   DOB: 05-27-1956, 65 y.o.   MRN: [id number] S: No new complaints O:BP (!) 158/79 (BP Location: Right Arm)   Pulse (!) 51   Temp 98.4 F (36.9 C) (Oral)   Resp 14   Ht '6\' 2"'$  (1.88 m)   Wt 120.1 kg   SpO2 92%   BMI 33.99 kg/m   Intake/Output Summary (Last 24 hours) at 01/22/2021 1247 Last data filed at 01/22/2021 0754 Gross per 24 hour  Intake 720 ml  Output 1950 ml  Net -1230 ml   Intake/Output: I/O last 3 completed shifts: In: 600 [P.O.:600] Out: 4300 [Urine:4300]  Intake/Output this shift:  Total I/O In: 240 [P.O.:240] Out: -  Weight change:  Gen: NAD CVS: RRR Resp: cta Abd: benign Ext: trace edema s/p RBKA staples in place.   Recent Labs  Lab 01/16/21 0153 01/17/21 1217 01/18/21 0209 01/19/21 0426 01/20/21 0430 01/21/21 0253 01/22/21 0608  NA 132* 135 133* 133* 134* 137 135  K 4.2 4.4 4.4 4.5 4.5 5.0 4.7  CL 105 108 105 104 107 106 110  CO2 16* 16* 16* 18* 18* 19* 16*  GLUCOSE 101* 113* 144* 142* 135* 112* 107*  BUN 72* 73* 73* 75* 77* 79* 79*  CREATININE 5.76* 5.64* 5.73* 6.00* 5.89* 5.79* 5.85*  ALBUMIN 1.5* 1.5* 1.5* 1.6* 1.5* 1.7* 1.7*  CALCIUM 7.8* 7.9* 7.9* 8.0* 7.8* 8.3* 8.1*  PHOS 8.0* 7.6*  --   --   --   --   --   AST 151*  --  65* 57* 59* 56* 77*  ALT 156*  --  121* 114* 104* 106* 110*   Liver Function Tests: Recent Labs  Lab 01/20/21 0430 01/21/21 0253 01/22/21 0608  AST 59* 56* 77*  ALT 104* 106* 110*  ALKPHOS 554* 669* 602*  BILITOT 3.6* 3.8* 4.5*  PROT 5.7* 6.4* 6.1*  ALBUMIN 1.5* 1.7* 1.7*   No results for input(s): LIPASE, AMYLASE in the last 168 hours. No results for input(s): AMMONIA in the last 168 hours. CBC: Recent Labs  Lab 01/16/21 0153 01/16/21 1859 01/18/21 0209 01/19/21 0426 01/21/21 0253  WBC 9.4 11.3* 10.2 9.2  --   NEUTROABS 6.6  --  7.9*  --   --   HGB 7.6* 9.4* 8.0* 8.3* 8.3*  HCT 20.9* 27.1* 22.1* 24.4* 23.7*  MCV 87.1 88.6 89.1 90.7  --   PLT 222 232 217 232  --     Cardiac Enzymes: No results for input(s): CKTOTAL, CKMB, CKMBINDEX, TROPONINI in the last 168 hours. CBG: Recent Labs  Lab 01/21/21 1128 01/21/21 1808 01/21/21 2130 01/22/21 0631 01/22/21 1135  GLUCAP 89 90 132* 92 104*    Iron Studies: No results for input(s): IRON, TIBC, TRANSFERRIN, FERRITIN in the last 72 hours. Studies/Results: No results found. Marland Kitchen acetaminophen  650 mg Oral Q8H  . amLODipine  10 mg Oral Daily  . aspirin EC  81 mg Oral QHS  . atenolol  50 mg Oral Daily  . Chlorhexidine Gluconate Cloth  6 each Topical Daily  . cloNIDine  0.2 mg Oral BID  . docusate sodium  100 mg Oral BID  . hydrALAZINE  50 mg Oral BID  . insulin aspart  0-15 Units Subcutaneous TID WC  . insulin aspart  2 Units Subcutaneous TID WC  . insulin glargine  20 Units Subcutaneous Daily  . iron polysaccharides  150 mg Oral Daily  . omega-3 acid ethyl esters  1 g Oral Daily  .  pregabalin  50 mg Oral TID  . sevelamer carbonate  800 mg Oral TID WC  . tamsulosin  0.4 mg Oral QPC supper  . traZODone  25-50 mg Oral QHS    BMET    Component Value Date/Time   NA 135 01/22/2021 0608   K 4.7 01/22/2021 0608   CL 110 01/22/2021 0608   CO2 16 (L) 01/22/2021 0608   GLUCOSE 107 (H) 01/22/2021 0608   BUN 79 (H) 01/22/2021 0608   CREATININE 5.85 (H) 01/22/2021 0608   CALCIUM 8.1 (L) 01/22/2021 0608   GFRNONAA 10 (L) 01/22/2021 0608   GFRAA >60 11/29/2019 2137   CBC    Component Value Date/Time   WBC 9.2 01/19/2021 0426   RBC 2.69 (L) 01/19/2021 0426   HGB 8.3 (L) 01/21/2021 0253   HCT 23.7 (L) 01/21/2021 0253   PLT 232 01/19/2021 0426   MCV 90.7 01/19/2021 0426   MCH 30.9 01/19/2021 0426   MCHC 34.0 01/19/2021 0426   RDW 19.1 (H) 01/19/2021 0426   LYMPHSABS 1.1 01/18/2021 0209   MONOABS 1.0 01/18/2021 0209   EOSABS 0.2 01/18/2021 0209   BASOSABS 0.1 01/18/2021 0209    Assessment/Plan:  1. AKI: b/l creat from mid 2021 was 1.09. Here creat 4.0 on admit 2/15, then peaked at 5.96 on  2/21. Suspect ATN but urinary retention as below. Renal ultrasound consistent with medical renal disease. Lisinopril, HCTZ dc'd. Admitted to taking NSAIDs prior to admission and had been on multiple antibiotics over the past several weeks. No indication for RRT at this time. Essentially stable but quite impaired and has tolerated lasix.  1. Gave dose of Lasix 80 mg IV x 12/27/22with good response 2. Stoppedcymbalta with renal failure 3. No uremic symptoms. 4. BUN/Cr slowly improving over the past 2 days. 5. Continue to follow UOP and Scr. 2. 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 Per orthopedics.  3. [id number] held. Acceptable control 4. Anemia- Secondary to chronic illness. Tsat low, holding IV Fe in the setting of active infection. AKI contributing as well. aranesp 40 mcg once on 2/23. On oral iron. PRBC's on 2/26 ordered  5. hyperphosphatemia - renal diet. Started renvela as binder  6. DM - Insulin per primary team.  7. Urinary retention: new per patient the last couple of weeks.Nowon flomax. Now with foley and continue same. Assess voiding trial soon - has been on flomax 8. Severe protein malnutrition - will need protein supplementation. 9. Disposition - cont with PT/OT 10. Abnormal LFT's - slowly improving. Possible shock liver.   Donetta Potts, MD [employer] [phone]

## 2021-01-22 NOTE — Progress Notes (Signed)
Physical Therapy Session Note  Patient Details  Name: Fernando Anthony MRN: [id number] Date of Birth: [birth year]  Today's Date: 01/22/2021 PT Individual Time: 0475-3391 PT Individual Time Calculation (min): 40 min   Short Term Goals: Week 1:  PT Short Term Goal 1 (Week 1): Pt will perform STS with min A and LRAD. PT Short Term Goal 2 (Week 1): Pt will propel wc  x 150 ft with supervision PT Short Term Goal 3 (Week 1): Pt will be independent with bed mobility. PT Short Term Goal 4 (Week 1): Pt will iniate gait training with LRAD.  Skilled Therapeutic Interventions/Progress Updates:   Pt received supine in bed and agreeable to PT at bed level. SLR x 8 BLE, hip abduction x 10 BLE,  Bridge through bolster under thighs x8, ankle PF/DF x 15 each direction with level 3 Tband, SAQ x 12BLE . Pt reports increasing pain from 6/10 to 8/10 following therex in the R residual limb and requesting to stop exercises. PT strongly encourages out of bed mobility, but pt denies due to fear of increased pain from moving. PT provided therapeutic use of self to encourage patient to participate with out of bed mobility, but pt continues to deny. Pt left in bed with call bell in reach and all needs met.        Therapy Documentation Precautions:  Precautions Precautions: Fall Precaution Comments: RLE wound vac/ swollen scotum Required Braces or Orthoses: Other Brace Other Brace: R residual limb guard and R shrinker- Pt still not wearing shrinker.  Says it is too painful due to swelling. Encouraged patient to wear shrinker but continues not to. Restrictions Weight Bearing Restrictions: Yes RLE Weight Bearing: Non weight bearing General:   Vital Signs: Therapy Vitals Temp: 98.5 F (36.9 C) Temp Source: Oral Pulse Rate: (!) 51 Resp: 16 BP: (!) 146/72 Patient Position (if appropriate): Lying Oxygen Therapy SpO2: 94 % O2 Device: Nasal Cannula Pain: Pain Assessment Pain Scale: 0-10 Pain Score: 8  Pain  Type: Surgical pain Pain Location: Leg Pain Orientation: Right Pain Descriptors / Indicators: Stabbing;Throbbing Pain Frequency: Intermittent Pain Onset: On-going Patients Stated Pain Goal: 3 Pain Intervention(s): Medication (See eMAR)    Therapy/Group: Individual Therapy  Lorie Phenix 01/22/2021, 3:25 PM

## 2021-01-22 NOTE — Progress Notes (Signed)
PROGRESS NOTE   Subjective/Complaints: Appreciate nursing education of wife regarding foley care. Appreciate nephrology eval, note reviewed. Cr back up to 5.85  He would like to try IV Ketamine for pain control- will consult anestheiology and check with pharm if permitted given Cr.    ROS:  Pt denies SOB, abd pain, CP, N/V/C/D, and vision changes, +residual limb pain, urinary retention   Objective:   No results found. Recent Labs    01/21/21 0253  HGB 8.3*  HCT 23.7*   Recent Labs    01/21/21 0253 01/22/21 0608  NA 137 135  K 5.0 4.7  CL 106 110  CO2 19* 16*  GLUCOSE 112* 107*  BUN 79* 79*  CREATININE 5.79* 5.85*  CALCIUM 8.3* 8.1*    Intake/Output Summary (Last 24 hours) at 01/22/2021 0854 Last data filed at 01/22/2021 0754 Gross per 24 hour  Intake 720 ml  Output 1950 ml  Net -1230 ml        Physical Exam: Vital Signs Blood pressure (!) 158/79, pulse (!) 51, temperature 98.4 F (36.9 C), temperature source Oral, resp. rate 14, height '6\' 2"'$  (1.88 m), weight 120.1 kg, SpO2 92 %.  Physical Exam Gen: no distress, normal appearing HEENT: oral mucosa pink and moist, NCAT Cardio: Bradycardic Chest: normal effort, normal rate of breathing Abd: soft, non-distended Ext: no edema Psych: pleasant, normal affect Musculoskeletal:    Comments: has foley, but scrotum is smaller- medium grapefruit with a few more normal wrinkles of scrotum.  Musculoskeletal:     Cervical back: Normal range of motion. No rigidity.     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 Residual limb incision healing very well.  Mild swelling of forearms and swelling to groin B/L- 3-4+ pitting edema B/L Buttocks look OK Has a few tiny heat rash spots on back   Neuro: Decreased sensation to light touch from mid calf downwards RLE- knee downwards is decreased greatly     Assessment/Plan: 1. Functional deficits which require 3+ hours per day of interdisciplinary therapy in a comprehensive inpatient rehab setting.  Physiatrist is providing close team supervision and 24 hour management of active medical problems listed below.  Physiatrist and rehab team continue to assess barriers to discharge/monitor patient progress toward functional and medical goals  Care Tool:  Bathing    Body parts bathed by patient: Right arm,Left arm,Chest,Abdomen,Right upper leg,Left upper leg,Face   Body parts bathed by helper: Left lower leg,Buttocks,Front perineal area Body parts n/a: Right lower leg   Bathing assist Assist Level: Maximal Assistance - Patient 24 - 49%     Upper Body Dressing/Undressing Upper body dressing   What is the patient wearing?: Pull over shirt    Upper body assist Assist Level: Moderate Assistance - Patient 50 - 74%    Lower Body Dressing/Undressing Lower body dressing      What is the patient wearing?: Incontinence brief,Pants     Lower body assist Assist for lower body  dressing: Maximal Assistance - Patient 25 - 49%     Toileting Toileting Toileting Activity did not occur Landscape architect and hygiene only): N/A (no void or bm)  Toileting assist Assist for toileting:  (not assessed at eval)     Transfers Chair/bed transfer  Transfers assist     Chair/bed transfer assist level: Minimal Assistance - Patient > 75%     Locomotion Ambulation   Ambulation assist   Ambulation activity did not occur: Safety/medical concerns          Walk 10 feet activity   Assist  Walk 10 feet activity did not occur: Safety/medical concerns        Walk 50 feet activity   Assist Walk 50 feet with 2 turns activity did not occur: Safety/medical concerns         Walk 150 feet activity   Assist Walk 150  feet activity did not occur: Safety/medical concerns         Walk 10 feet on uneven surface  activity   Assist Walk 10 feet on uneven surfaces activity did not occur: Safety/medical concerns         Wheelchair     Assist Will patient use wheelchair at discharge?: Yes Type of Wheelchair: Manual    Wheelchair assist level: Supervision/Verbal cueing Max wheelchair distance: 132f    Wheelchair 50 feet with 2 turns activity    Assist        Assist Level: Supervision/Verbal cueing   Wheelchair 150 feet activity     Assist      Assist Level: Supervision/Verbal cueing   Blood pressure (!) 158/79, pulse (!) 51, temperature 98.4 F (36.9 C), temperature source Oral, resp. rate 14, height '6\' 2"'$  (1.88 m), weight 120.1 kg, SpO2 92 %.  Medical Problem List and Plan: 1.  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  Wound VAC as directed             -patient may  Shower once VAC off- and R BKA covered             -ELOS/Goals: 14-18 days- mod I to supervision  -Continue CIR 2.  Antithrombotics: -DVT/anticoagulation: SCDs left lower extremity             -antiplatelet therapy: Continue Aspirin 81 mg daily 3. Pain Management: Lyrica 50 mg 3 times daily, Robaxin and oxycodone as needed- add Duloxetine 20 mg QHS for nerve pain  2/27- no side effects so far- con't regimen  2/28: waking at night for pain medication. Add Oxycontin '10mg'$  BID, discussed how this works and using less PRN medication.   3/1: shrinker is painful for patient- discussed with Sarah OT applying ace wrap for now and I have ordered a new shrinker.   3/2: Stop log-acting oxycodone given that it did not help significantly and because patient has history of opioid addiction  3/3: Please try TENS and mirror therapy- will message therapy. Will discuss with Dr. DJess Bartersteam nerve block and nitroglycerin patch as options.   3/4:  will discuss plan for opioid taper with patient: 3/4: oxycodone '10mg'$  4H prn, 3/7: oxycodone '5mg'$  q4H prn, 3/9: oxycodone '5mg'$  q6H prn, 3/12: oxycodone '5mg'$  q8H prn, 3/15: oxycodone '5mg'$  q12H prn, 3/16: oxycodone '5mg'$  daily prn, 3/17: discharge- no opioids upon discharge.  4. Mood: Provide emotional support             -antipsychotic agents: N/A 5. Neuropsych: This patient is capable of  making decisions on his own behalf. 6. Skin/Wound Care: Routine skin checks. May d/c wound vac 2/28 7. Fluids/Electrolytes/Nutrition: Routine in and outs with follow-up chemistries 8.  Acute on chronic anemia.  Continue Niferex.    3/3: Hgb reviewed, 8.3, repeat Monday  9.  AKI/ATN.  Follow-up nephrology services.  No current plan for hemodialysis- Current Cr 5.76  3/3: discussed etiologies of his ATN: Cr up to 8.5, appreciate nephrology following, note reviewed. Repeat CMP tomorrow.  10.  Hypertension.  Hydralazine 50 mg twice daily, Norvasc 10 mg daily, clonidine 0.1 mg twice daily, Tenormin 50 mg daily.  Monitor with increased mobility  3/4: systolic BP elevated on last 5 reads: increase clonidine to .'2mg'$  (can also help with pain) 11.  Diabetes mellitus with peripheral neuropathy.  Hemoglobin A1c 10.5.  NovoLog 4 units 3 times daily, Lantus insulin 35 units nightly.  Check blood sugars before meals and at bedtime  2/27- BGs 127-160- con't regimen for now-  12.  Hyperlipidemia.  Lovaza 13.  Urinary retention with scrotal edema.  Flomax 0.4 mg daily.  Continue Foley tube for now until scrotal edema improves and then plan voiding trial.  2/27- elevating on hand towel- is working- also , Renal giving 80 mg IV Lasix x1 today- should also help LE edema/and scrotal edema.  14.  History of tobacco abuse.  Counseling 15. Protein malnutrition- severe- Alb 1.5- cause of edema likely 16. LE edema- severe- don't see Diuretic- likely due to Renal issues- elevating scrotum- con't regimen  2/27- see #13  3/4: improving 17.  Insomnia- try Trazodone 25-50 mg QHS for sleep   2/27- working- con't regimen 18. Transaminitis: stable, continue tylenol '650mg'$  TID with daily LFT check. 19. Bradycardia: continue to monitor TID   >35 minutes spent in discussion of pain with patient, discussion of opioid taper, IV Ketamine, consulting anesthesiology and pharmacy, discussing with rehab medical director, reviewing and educating regarding creatinine and hgb, discussing patient's care with neuropsych, >50% time spent in care and coordination of care      LOS: 6 days A FACE TO Jones 01/22/2021, 8:54 AM

## 2021-01-22 NOTE — Telephone Encounter (Signed)
Martha Clan Raulker called from inpatient rehab. She has a few questions that she needs Dr.Duda's attention on... 1- Is it okay to apply a nitroglycerin patch? 2- Is the patch used for only pain control, or also beneficial in the healing process? 3- Would it be okay to do a peroneal nerve block for the use of physical therapy?  Please call her back at [phone]  Thanks!

## 2021-01-22 NOTE — Progress Notes (Signed)
Physical Therapy Session Note  Patient Details  Name: Fernando Anthony MRN: [id number] Date of Birth: [birth year]   Today's Date: 01/22/2021 PT Individual Time: 1102-1125 PT Individual Time Calculation (min): 23 min   Short Term Goals: Week 1:  PT Short Term Goal 1 (Week 1): Pt will perform STS with min A and LRAD. PT Short Term Goal 2 (Week 1): Pt will propel wc  x 150 ft with supervision PT Short Term Goal 3 (Week 1): Pt will be independent with bed mobility. PT Short Term Goal 4 (Week 1): Pt will iniate gait training with LRAD.  Skilled Therapeutic Interventions/Progress Updates:     Pt greeted supine in bed and reporting 7/10 R residual limb pain - incisional, not phantom. Pt reports he isn't due for pain medication until noon - reports MD has been weaning his narcotic's and he is having difficulty adjusting with pain management. Pt grimmacing frequently during conversation, especially with simple R leg movements - incision is c/d/i. He pleasantly defer's any OOB mobility as he expresses fearfulness of movement 2/2 pain elevation. Lengthy education regarding densensitization, non-pharmaceutical means of pain management, etc - however, patient is too fixated on pain and is very pain focused. Pt expressing frustration regarding pain and states he wishes he could get up and work with therapy but pain is too much. Educated on positioning and therapist donned shrinker to promote shaping/healing. Patient remained semi-reclined in bed with needs in reach, pillow under end of residual limb to promote knee extension, bed alarm on. Pt missed 37 minutes of skilled therapy due to R residual limb pain.  Therapy Documentation Precautions:  Precautions Precautions: Fall Precaution Comments: RLE wound vac/ swollen scotum Required Braces or Orthoses: Other Brace Other Brace: R residual limb guard and R shrinker- Pt still not wearing shrinker.  Says it is too painful due to swelling. Encouraged patient to wear  shrinker but continues not to. Restrictions Weight Bearing Restrictions: Yes RLE Weight Bearing: Non weight bearing General: PT Amount of Missed Time (min): 37 Minutes PT Missed Treatment Reason: Pain (R BKA - residual limb)  Therapy/Group: Individual Therapy  Christian P Manhard PT 01/22/2021, 11:25 AM

## 2021-01-22 NOTE — Progress Notes (Signed)
Patient's wife present and when questioned by the RN on his CHG bath, the patient stated, " I would prefer my wife do that."  RN spoke with spouse regarding education on how to properly perform the task.  Wife stated, "The RN from yesterday showed me how to do it.  I did it yesterday by myself."

## 2021-01-23 DIAGNOSIS — Z89511 Acquired absence of right leg below knee: Secondary | ICD-10-CM | POA: Diagnosis not present

## 2021-01-23 LAB — URINALYSIS, COMPLETE (UACMP) WITH MICROSCOPIC
Bilirubin Urine: NEGATIVE
Glucose, UA: NEGATIVE mg/dL
Ketones, ur: NEGATIVE mg/dL
Leukocytes,Ua: NEGATIVE
Nitrite: NEGATIVE
Protein, ur: 100 mg/dL — AB
Specific Gravity, Urine: 1.009 (ref 1.005–1.030)
pH: 5 (ref 5.0–8.0)

## 2021-01-23 LAB — COMPREHENSIVE METABOLIC PANEL
ALT: 112 U/L — ABNORMAL HIGH (ref 0–44)
AST: 73 U/L — ABNORMAL HIGH (ref 15–41)
Albumin: 1.7 g/dL — ABNORMAL LOW (ref 3.5–5.0)
Alkaline Phosphatase: 605 U/L — ABNORMAL HIGH (ref 38–126)
Anion gap: 10 (ref 5–15)
BUN: 79 mg/dL — ABNORMAL HIGH (ref 8–23)
CO2: 14 mmol/L — ABNORMAL LOW (ref 22–32)
Calcium: 8.1 mg/dL — ABNORMAL LOW (ref 8.9–10.3)
Chloride: 111 mmol/L (ref 98–111)
Creatinine, Ser: 5.63 mg/dL — ABNORMAL HIGH (ref 0.61–1.24)
GFR, Estimated: 11 mL/min — ABNORMAL LOW (ref >60)
Glucose, Bld: 88 mg/dL (ref 70–99)
Potassium: 4.9 mmol/L (ref 3.5–5.1)
Sodium: 135 mmol/L (ref 135–145)
Total Bilirubin: 4 mg/dL — ABNORMAL HIGH (ref 0.3–1.2)
Total Protein: 6 g/dL — ABNORMAL LOW (ref 6.5–8.1)

## 2021-01-23 LAB — GLUCOSE, CAPILLARY
Glucose-Capillary: 79 mg/dL (ref 70–99)
Glucose-Capillary: 115 mg/dL — ABNORMAL HIGH (ref 70–99)
Glucose-Capillary: 139 mg/dL — ABNORMAL HIGH (ref 70–99)
Glucose-Capillary: 140 mg/dL — ABNORMAL HIGH (ref 70–99)

## 2021-01-23 LAB — HEPATITIS PANEL, ACUTE
HCV Ab: NONREACTIVE
Hep A IgM: NONREACTIVE
Hep B C IgM: NONREACTIVE
Hepatitis B Surface Ag: NONREACTIVE

## 2021-01-23 LAB — SODIUM, URINE, RANDOM: Sodium, Ur: 61 mmol/L

## 2021-01-23 LAB — PROTEIN / CREATININE RATIO, URINE
Creatinine, Urine: 42.52 mg/dL
Protein Creatinine Ratio: 3.55 mg/mg{Cre} — ABNORMAL HIGH (ref 0.00–0.15)
Total Protein, Urine: 151 mg/dL

## 2021-01-23 LAB — CREATININE, URINE, RANDOM: Creatinine, Urine: 42.31 mg/dL

## 2021-01-23 MED ORDER — ALBUMIN HUMAN 25 % IV SOLN
12.5000 g | Freq: Once | INTRAVENOUS | Status: AC
  Administered 2021-01-23: 12.5 g via INTRAVENOUS
  Filled 2021-01-23: qty 50

## 2021-01-23 MED ORDER — ATENOLOL 25 MG PO TABS
25.0000 mg | ORAL_TABLET | Freq: Every day | ORAL | Status: DC
  Administered 2021-01-23: 25 mg via ORAL
  Filled 2021-01-23: qty 1

## 2021-01-23 NOTE — Progress Notes (Signed)
PROGRESS NOTE   Subjective/Complaints:  Pt experiencing SOB despite diuresis, HR on low side , upper 40s to low 50s.  DIscussed with nephro , ECHO looks ok, alb low , IV alb with diuretic planned but cardiology consult also suggested  No sig pain today   ROS:  Pt denies SOB, abd pain, CP, N/V/D   Objective:   No results found. Recent Labs    01/21/21 0253  HGB 8.3*  HCT 23.7*   Recent Labs    01/22/21 0608 01/23/21 0216  NA 135 135  K 4.7 4.9  CL 110 111  CO2 16* 14*  GLUCOSE 107* 88  BUN 79* 79*  CREATININE 5.85* 5.63*  CALCIUM 8.1* 8.1*    Intake/Output Summary (Last 24 hours) at 01/23/2021 1010 Last data filed at 01/23/2021 0749 Gross per 24 hour  Intake 600 ml  Output 1200 ml  Net -600 ml        Physical Exam: Vital Signs Blood pressure (!) 169/74, pulse (!) 50, temperature 97.8 F (36.6 C), temperature source Oral, resp. rate 14, height '6\' 2"'$  (1.88 m), weight 120.1 kg, SpO2 94 %.  General: No acute distress Mood and affect are appropriate Heart: Regular rate and rhythm no rubs murmurs or extra sounds Lungs: Clear to auscultation, breathing unlabored, no rales or wheezes Abdomen: Positive bowel sounds, soft nontender to palpation, nondistended Extremities: No clubbing, cyanosis, or edema Skin: No evidence of breakdown, no evidence of rash  Musculoskeletal:     Cervical back: Normal range of motion. No rigidity.     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 Residual limb incision healing very well.  Mild swelling of forearms and swelling to groin B/L- 3-4+ pitting edema B/L Buttocks look OK Has a few tiny heat rash spots on back  Neuro: Decreased sensation to light touch from mid calf downwards RLE- knee downwards is  decreased greatly     Assessment/Plan: 1. Functional deficits which require 3+ hours per day of interdisciplinary therapy in a comprehensive inpatient rehab setting.  Physiatrist is providing close team supervision and 24 hour management of active medical problems listed below.  Physiatrist and rehab team continue to assess barriers to discharge/monitor patient progress toward functional and medical goals  Care Tool:  Bathing    Body parts bathed by patient: Right arm,Left arm,Chest,Abdomen,Right upper leg,Left upper leg,Face   Body parts bathed by helper: Left lower leg,Buttocks,Front perineal area Body parts n/a: Right lower leg   Bathing assist Assist Level: Maximal Assistance - Patient 24 - 49%     Upper Body Dressing/Undressing Upper body dressing   What is the patient wearing?: Pull over shirt    Upper body assist Assist Level: Moderate Assistance - Patient 50 - 74%    Lower Body Dressing/Undressing Lower body dressing      What is the patient wearing?: Incontinence brief,Pants     Lower body assist Assist for lower body dressing: Maximal Assistance - Patient 25 -  49%     Toileting Toileting Toileting Activity did not occur Landscape architect and hygiene only): N/A (no void or bm)  Toileting assist Assist for toileting:  (not assessed at eval)     Transfers Chair/bed transfer  Transfers assist     Chair/bed transfer assist level: Minimal Assistance - Patient > 75%     Locomotion Ambulation   Ambulation assist   Ambulation activity did not occur: Safety/medical concerns          Walk 10 feet activity   Assist  Walk 10 feet activity did not occur: Safety/medical concerns        Walk 50 feet activity   Assist Walk 50 feet with 2 turns activity did not occur: Safety/medical concerns         Walk 150 feet activity   Assist Walk 150 feet activity did not occur: Safety/medical concerns         Walk 10 feet on uneven  surface  activity   Assist Walk 10 feet on uneven surfaces activity did not occur: Safety/medical concerns         Wheelchair     Assist Will patient use wheelchair at discharge?: Yes Type of Wheelchair: Manual    Wheelchair assist level: Supervision/Verbal cueing Max wheelchair distance: 191f    Wheelchair 50 feet with 2 turns activity    Assist        Assist Level: Supervision/Verbal cueing   Wheelchair 150 feet activity     Assist      Assist Level: Supervision/Verbal cueing   Blood pressure (!) 169/74, pulse (!) 50, temperature 97.8 F (36.6 C), temperature source Oral, resp. rate 14, height '6\' 2"'$  (1.88 m), weight 120.1 kg, SpO2 94 %.  Medical Problem List and Plan: 1.  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  Wound VAC as directed             -patient may  Shower once VAC off- and R BKA covered             -ELOS/Goals: 14-18 days- mod I to supervision  -Continue CIR 2.  Antithrombotics: -DVT/anticoagulation: SCDs left lower extremity             -antiplatelet therapy: Continue Aspirin 81 mg daily 3. Pain Management: Lyrica 50 mg 3 times daily, Robaxin and oxycodone as needed- add Duloxetine 20 mg QHS for nerve pain  2/27- no side effects so far- con't regimen  2/28: waking at night for pain medication. Add Oxycontin '10mg'$  BID, discussed how this works and using less PRN medication.   3/1: shrinker is painful for patient- discussed with Sarah OT applying ace wrap for now and I have ordered a new shrinker.   3/2: Stop log-acting oxycodone given that it did not help significantly and because patient has history of opioid addiction  3/3: Please try TENS and mirror therapy- will message therapy. Will discuss with Dr. DJess Bartersteam nerve block and nitroglycerin patch as options.   3/4: will discuss plan for opioid taper with patient: 3/4: oxycodone '10mg'$  4H prn, 3/7:  oxycodone '5mg'$  q4H prn, 3/9: oxycodone '5mg'$  q6H prn, 3/12: oxycodone '5mg'$  q8H prn, 3/15: oxycodone '5mg'$  q12H prn, 3/16: oxycodone '5mg'$  daily prn, 3/17: discharge- no opioids upon discharge.  4. Mood: Provide emotional support             -antipsychotic agents: N/A 5. Neuropsych: This patient is capable of making decisions on his own behalf. 6.  Skin/Wound Care: Routine skin checks. May d/c wound vac 2/28 7. Fluids/Electrolytes/Nutrition: Routine in and outs with follow-up chemistries 8.  Acute on chronic anemia.  Continue Niferex.    3/3: Hgb reviewed, 8.3, repeat Monday  9.  AKI/ATN.  Follow-up nephrology services.  No current plan for hemodialysis- Current Cr 5.76  3/3: discussed etiologies of his ATN: Cr up to 8.5, appreciate nephrology following, note reviewed. Repeat CMP tomorrow.  10.  Hypertension.  Hydralazine 50 mg twice daily, Norvasc 10 mg daily, clonidine 0.1 mg twice daily, Tenormin 50 mg daily.  Monitor with increased mobility  3/4: systolic BP elevated on last 5 reads: increase clonidine to .'2mg'$  (can also help with pain) 11.  Diabetes mellitus with peripheral neuropathy.  Hemoglobin A1c 10.5.  NovoLog 4 units 3 times daily, Lantus insulin 35 units nightly.  Check blood sugars before meals and at bedtime  2/27- BGs 127-160- con't regimen for now-  12.  Hyperlipidemia.  Lovaza 13.  Urinary retention with scrotal edema.  Flomax 0.4 mg daily.  Continue Foley tube for now until scrotal edema improves and then plan voiding trial.  2/27- elevating on hand towel- is working- also , Renal giving 80 mg IV Lasix x1 today- should also help LE edema/and scrotal edema.  14.  History of tobacco abuse.  Counseling 15. Protein malnutrition- severe- Alb 1.5- cause of edema likely 16. LE edema- severe- don't see Diuretic- likely due to Renal issues- elevating scrotum- con't regimen  2/27- see #13  3/4: improving 17. Insomnia- try Trazodone 25-50 mg QHS for sleep   2/27- working- con't regimen 18.  Transaminitis: stable, continue tylenol '650mg'$  TID with daily LFT check. 19. Bradycardia:now symptomatic , reduce atenolol, per nephro will get cardiology to eval as well  continue to monitor TID         LOS: 7 days A FACE TO FACE EVALUATION WAS PERFORMED  Charlett Blake 01/23/2021, 10:10 AM

## 2021-01-23 NOTE — Progress Notes (Signed)
Occupational Therapy Session Note  Patient Details  Name: Fernando Anthony MRN: [id number] Date of Birth: [birth year]  Today's Date: 01/23/2021 OT Individual Time: 1005-1048 OT Individual Time Calculation (min): 43 min   Skilled Therapeutic Interventions/Progress Updates:    Pt greeted in bed with c/o 8/10 pain in residual limb at rest. RN in during session to provide pain medicine. Per pt, having his limb elevated positionally also helps to manage his pain. Residual limb elevated with 2 pillows throughout session. Started with donning Rt shrinker sock, Lt Ted hose + Lt gripper sock with Total A. He reported discomfort with his black shrinker sock and therefore we donned the grey one instead with pt able to assist with elevating fabric over thigh. Guided him through UB exercises using the green therband x10 reps each exercise including straight arm raises, forward/overhead abduction, punches, and diagonals. Utilized teach back technique in order to assess pts carryover of education so he can independently complete exercises in the room. Discussed importance of frequent repositioning in bed to prevent muscle atrophy and skin breakdown. He did not want to attempt any OOB activity today, adamant that he wanted to wait until Monday when he will try a new pain medication prescribed by MD. Also guided him through gentle shoulder stretches including forward/backward rolls, shrugs, and scapular pinches, pt quickly fatiguing after 10 reps of 1 exercise or stretch. With MD consent, provided him with an ice pack for the Rt arm due to swelling at elbow. At end of session pt remained in bed, left with all needs within reach, bed alarm set, and spouse at bedside.   Therapy Documentation Precautions:  Precautions Precautions: Fall Precaution Comments: RLE wound vac/ swollen scotum Required Braces or Orthoses: Other Brace Other Brace: R residual limb guard and R shrinker- Pt still not wearing shrinker.  Says it is too  painful due to swelling. Encouraged patient to wear shrinker but continues not to. Restrictions Weight Bearing Restrictions: Yes RLE Weight Bearing: Non weight bearing Vital Signs: Therapy Vitals BP: (!) 164/72 Pain: Pain Assessment Pain Scale: 0-10 Pain Score: 4  Pain Type: Surgical pain;Acute pain Pain Location: Leg Pain Orientation: Right Pain Descriptors / Indicators: Aching;Sharp Pain Frequency: Intermittent Pain Onset: On-going Patients Stated Pain Goal: 4 Pain Intervention(s): Medication (See eMAR) ADL: ADL Eating: Not assessed Grooming: Not assessed Upper Body Bathing: Contact guard Lower Body Bathing: Maximal assistance Upper Body Dressing: Moderate assistance Where Assessed-Upper Body Dressing: Bed level Lower Body Dressing: Maximal assistance Where Assessed-Lower Body Dressing: Bed level Toileting: Not assessed (pt declined) Toilet Transfer: Not assessed      Therapy/Group: Individual Therapy  [name] 01/23/2021, 12:47 PM

## 2021-01-23 NOTE — Progress Notes (Signed)
Patient ID: Fernando Anthony, male   DOB: [birth year], 65 y.o.   MRN: [id number] S: Has noted some increased SOB at times and slow heart rate which is new to him.  He denies any chest pain, just occ SOB.  No orthopnea or PND. O:BP (!) 164/72   Pulse (!) 50   Temp 97.8 F (36.6 C) (Oral)   Resp 14   Ht '6\' 2"'$  (1.88 m)   Wt 120.1 kg   SpO2 94%   BMI 33.99 kg/m   Intake/Output Summary (Last 24 hours) at 01/23/2021 1118 Last data filed at 01/23/2021 1000 Gross per 24 hour  Intake 600 ml  Output 3200 ml  Net -2600 ml   Intake/Output: I/O last 3 completed shifts: In: 600 [P.O.:600] Out: 2300 [Urine:2300]  Intake/Output this shift:  Total I/O In: 240 [P.O.:240] Out: 2000 [Urine:2000] Weight change:  Gen: NAD CVS: bradycardic at 50 Resp: cta Abd: +BS, soft, NT/Nd Ext: s/p RBKA, staples in place, 1+ edema of LLE and presacrum.   Recent Labs  Lab 01/17/21 1217 01/18/21 0209 01/19/21 0426 01/20/21 0430 01/21/21 0253 01/22/21 0608 01/23/21 0216  NA 135 133* 133* 134* 137 135 135  K 4.4 4.4 4.5 4.5 5.0 4.7 4.9  CL 108 105 104 107 106 110 111  CO2 16* 16* 18* 18* 19* 16* 14*  GLUCOSE 113* 144* 142* 135* 112* 107* 88  BUN 73* 73* 75* 77* 79* 79* 79*  CREATININE 5.64* 5.73* 6.00* 5.89* 5.79* 5.85* 5.63*  ALBUMIN 1.5* 1.5* 1.6* 1.5* 1.7* 1.7* 1.7*  CALCIUM 7.9* 7.9* 8.0* 7.8* 8.3* 8.1* 8.1*  PHOS 7.6*  --   --   --   --   --   --   AST  --  65* 57* 59* 56* 77* 73*  ALT  --  121* 114* 104* 106* 110* 112*   Liver Function Tests: Recent Labs  Lab 01/21/21 0253 01/22/21 0608 01/23/21 0216  AST 56* 77* 73*  ALT 106* 110* 112*  ALKPHOS 669* 602* 605*  BILITOT 3.8* 4.5* 4.0*  PROT 6.4* 6.1* 6.0*  ALBUMIN 1.7* 1.7* 1.7*   No results for input(s): LIPASE, AMYLASE in the last 168 hours. No results for input(s): AMMONIA in the last 168 hours. CBC: Recent Labs  Lab 01/16/21 1859 01/18/21 0209 01/19/21 0426 01/21/21 0253  WBC 11.3* 10.2 9.2  --   NEUTROABS  --  7.9*  --   --    HGB 9.4* 8.0* 8.3* 8.3*  HCT 27.1* 22.1* 24.4* 23.7*  MCV 88.6 89.1 90.7  --   PLT 232 217 232  --    Cardiac Enzymes: No results for input(s): CKTOTAL, CKMB, CKMBINDEX, TROPONINI in the last 168 hours. CBG: Recent Labs  Lab 01/22/21 1135 01/22/21 1627 01/22/21 1712 01/22/21 2053 01/23/21 0612  GLUCAP 104* 61* 91 101* 79    Iron Studies: No results for input(s): IRON, TIBC, TRANSFERRIN, FERRITIN in the last 72 hours. Studies/Results: No results found. Marland Kitchen acetaminophen  650 mg Oral Q8H  . amLODipine  10 mg Oral Daily  . aspirin EC  81 mg Oral QHS  . atenolol  25 mg Oral Daily  . Chlorhexidine Gluconate Cloth  6 each Topical Daily  . cloNIDine  0.2 mg Oral BID  . docusate sodium  100 mg Oral BID  . hydrALAZINE  50 mg Oral BID  . insulin aspart  0-15 Units Subcutaneous TID WC  . insulin aspart  2 Units Subcutaneous TID WC  . insulin glargine  20 Units Subcutaneous Daily  . iron polysaccharides  150 mg Oral Daily  . omega-3 acid ethyl esters  1 g Oral Daily  . pregabalin  50 mg Oral TID  . sevelamer carbonate  800 mg Oral TID WC  . tamsulosin  0.4 mg Oral QPC supper  . traZODone  25-50 mg Oral QHS    BMET    Component Value Date/Time   NA 135 01/23/2021 0216   K 4.9 01/23/2021 0216   CL 111 01/23/2021 0216   CO2 14 (L) 01/23/2021 0216   GLUCOSE 88 01/23/2021 0216   BUN 79 (H) 01/23/2021 0216   CREATININE 5.63 (H) 01/23/2021 0216   CALCIUM 8.1 (L) 01/23/2021 0216   GFRNONAA 11 (L) 01/23/2021 0216   GFRAA >60 11/29/2019 2137   CBC    Component Value Date/Time   WBC 9.2 01/19/2021 0426   RBC 2.69 (L) 01/19/2021 0426   HGB 8.3 (L) 01/21/2021 0253   HCT 23.7 (L) 01/21/2021 0253   PLT 232 01/19/2021 0426   MCV 90.7 01/19/2021 0426   MCH 30.9 01/19/2021 0426   MCHC 34.0 01/19/2021 0426   RDW 19.1 (H) 01/19/2021 0426   LYMPHSABS 1.1 01/18/2021 0209   MONOABS 1.0 01/18/2021 0209   EOSABS 0.2 01/18/2021 0209   BASOSABS 0.1 01/18/2021 0209      Assessment/Plan:  1. AKI/CKD stage IIIa: b/l creat from mid 2021 was 1.09. Here creat 4.0 on admit 2/15, then peaked at 5.96 on 2/21. Suspect ATN but urinary retention as below. Renal ultrasound consistent with medical renal disease. Lisinopril, HCTZ dc'd. Admitted to taking NSAIDs prior to admission and had been on multiple antibiotics over the past several weeks. No indication for RRT at this time. Essentially stable but quite impaired and has tolerated lasix. complements were WNL and negative SPEP. 1. Gave dose of Lasix 80 mg IV x 12/27/22with goodresponse and has continued to diurese on his own without much change in Scr. 2. Stoppedcymbalta with renal failure 3. No uremic symptoms. 4. BUN/Cr slowly improving over the past 2 days. 5. Continue to follow UOP and Scr. 6. Will dose with IV albumin to see if that improves edema 7. Will also check 24 hour urine protein, if nephrotic may want to proceed with renal biopsy given persistent ATN/AKI but proteinuria likely due to longstanding poorly controlled DM. 2. Bradycardia - somewhat symptomatic.  Atenolol held and is also on amlodipine.  Recommend cardiology evaluation and suggestions on management as we are avoiding ACE/ARB with AKI/CKD 3. 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 Per orthopedics.  4. [id number] held. Acceptable control  5. Anemia- Secondary to chronic illness. Tsat low, holding IV Fe in the setting of active infection. AKI contributing as well. aranesp 40 mcg once on 2/23. On oral iron. PRBC's on 2/26 ordered  6. hyperphosphatemia - renal diet. Started renvela as binder  7. DM - Insulin per primary team.  8. Urinary retention: new per patient the last couple of weeks.Nowon flomax. Now with foley and continue same. Assess voiding trial soon - has been on flomax 9. Severe protein malnutrition - will need protein supplementation. 10. Disposition - cont with  PT/OT 11. Abnormal LFT's - slowly improving. Possible shock liver.RUQ [place] without focal liver lesion or evidence of cirrhosis.  Will check hepatitis panel.   Donetta Potts, MD [employer] [phone]

## 2021-01-24 ENCOUNTER — Inpatient Hospital Stay (HOSPITAL_COMMUNITY): Payer: BC Managed Care – PPO

## 2021-01-24 DIAGNOSIS — I10 Essential (primary) hypertension: Secondary | ICD-10-CM

## 2021-01-24 DIAGNOSIS — N179 Acute kidney failure, unspecified: Secondary | ICD-10-CM

## 2021-01-24 DIAGNOSIS — R0609 Other forms of dyspnea: Secondary | ICD-10-CM | POA: Diagnosis not present

## 2021-01-24 DIAGNOSIS — Z89511 Acquired absence of right leg below knee: Secondary | ICD-10-CM | POA: Diagnosis not present

## 2021-01-24 LAB — COMPREHENSIVE METABOLIC PANEL
ALT: 106 U/L — ABNORMAL HIGH (ref 0–44)
AST: 60 U/L — ABNORMAL HIGH (ref 15–41)
Albumin: 1.8 g/dL — ABNORMAL LOW (ref 3.5–5.0)
Alkaline Phosphatase: 579 U/L — ABNORMAL HIGH (ref 38–126)
Anion gap: 11 (ref 5–15)
BUN: 78 mg/dL — ABNORMAL HIGH (ref 8–23)
CO2: 16 mmol/L — ABNORMAL LOW (ref 22–32)
Calcium: 8.2 mg/dL — ABNORMAL LOW (ref 8.9–10.3)
Chloride: 109 mmol/L (ref 98–111)
Creatinine, Ser: 5.36 mg/dL — ABNORMAL HIGH (ref 0.61–1.24)
GFR, Estimated: 11 mL/min — ABNORMAL LOW (ref >60)
Glucose, Bld: 148 mg/dL — ABNORMAL HIGH (ref 70–99)
Potassium: 5.3 mmol/L — ABNORMAL HIGH (ref 3.5–5.1)
Sodium: 136 mmol/L (ref 135–145)
Total Bilirubin: 3.7 mg/dL — ABNORMAL HIGH (ref 0.3–1.2)
Total Protein: 6 g/dL — ABNORMAL LOW (ref 6.5–8.1)

## 2021-01-24 LAB — GLUCOSE, CAPILLARY
Glucose-Capillary: 153 mg/dL — ABNORMAL HIGH (ref 70–99)
Glucose-Capillary: 127 mg/dL — ABNORMAL HIGH (ref 70–99)
Glucose-Capillary: 58 mg/dL — ABNORMAL LOW (ref 70–99)
Glucose-Capillary: 106 mg/dL — ABNORMAL HIGH (ref 70–99)
Glucose-Capillary: 151 mg/dL — ABNORMAL HIGH (ref 70–99)

## 2021-01-24 MED ORDER — SODIUM ZIRCONIUM CYCLOSILICATE 10 G PO PACK
10.0000 g | PACK | Freq: Once | ORAL | Status: AC
  Administered 2021-01-24: 10 g via ORAL
  Filled 2021-01-24: qty 1

## 2021-01-24 MED ORDER — ALBUMIN HUMAN 25 % IV SOLN
12.5000 g | Freq: Once | INTRAVENOUS | Status: AC
  Administered 2021-01-24: 12.5 g via INTRAVENOUS
  Filled 2021-01-24: qty 50

## 2021-01-24 NOTE — Progress Notes (Signed)
PROGRESS NOTE   Subjective/Complaints:  Called by nursing, nephrology wanted PM&R to assess shortness of breath.  Patient states that he has had some problems throughout his hospital stay but worsened yesterday.  He has increased shortness of breath when sitting forward and less so when reclined.  He is concerned about his distended abdomen but has no abdominal pain nausea or vomiting.  We discussed his elevated LFTs his general fluid retention due to low albumin level. The patient does not have any chest pain.  O2 sats are okay, denies cough  ROS:  Pt denies SOB when laying back but has it when he sits up., abd pain, CP, N/V/D   Objective:   No results found. No results for input(s): WBC, HGB, HCT, PLT in the last 72 hours. Recent Labs    01/23/21 0216 01/24/21 0235  NA 135 136  K 4.9 5.3*  CL 111 109  CO2 14* 16*  GLUCOSE 88 148*  BUN 79* 78*  CREATININE 5.63* 5.36*  CALCIUM 8.1* 8.2*    Intake/Output Summary (Last 24 hours) at 01/24/2021 1204 Last data filed at 01/24/2021 0724 Gross per 24 hour  Intake 720 ml  Output 2750 ml  Net -2030 ml        Physical Exam: Vital Signs Blood pressure (!) 141/72, pulse (!) 51, temperature 98.4 F (36.9 C), temperature source Oral, resp. rate 16, height '6\' 2"'$  (1.88 m), weight 120.1 kg, SpO2 94 %.   General: No acute distress Mood and affect are appropriate Heart: Regular rate and rhythm no rubs murmurs or extra sounds Lungs: Clear to auscultation, breathing unlabored, no rales or wheezes, decreased breath sounds at bases. Abdomen: Positive bowel sounds, soft nontender to palpation, positive ascites Extremities: No clubbing, cyanosis, or edema Skin: No evidence of breakdown, no evidence of rash    Musculoskeletal:     Cervical back: Normal range of motion. No rigidity.     Comments: UEs 5/5 in biceps, triceps, WE, grip and finger abd B/L LLE- HF 4/5, KE, 4/5, DF and PF  5-/5 RLE- HF 3-/5, KE 3-/5 R BKA-healing well with staples intact no evidence of erythema or drainage     Assessment/Plan: 1. Functional deficits which require 3+ hours per day of interdisciplinary therapy in a comprehensive inpatient rehab setting.  Physiatrist is providing close team supervision and 24 hour management of active medical problems listed below.  Physiatrist and rehab team continue to assess barriers to discharge/monitor patient progress toward functional and medical goals  Care Tool:  Bathing    Body parts bathed by patient: Right arm,Left arm,Chest,Abdomen,Right upper leg,Left upper leg,Face   Body parts bathed by helper: Left lower leg,Buttocks,Front perineal area Body parts n/a: Right lower leg   Bathing assist Assist Level: Maximal Assistance - Patient 24 - 49%     Upper Body Dressing/Undressing Upper body dressing   What is the patient wearing?: Pull over shirt    Upper body assist Assist Level: Moderate Assistance - Patient 50 - 74%    Lower Body Dressing/Undressing Lower body dressing      What is the patient wearing?: Incontinence brief,Pants     Lower body assist Assist for lower  body dressing: Maximal Assistance - Patient 25 - 49%     Toileting Toileting Toileting Activity did not occur Landscape architect and hygiene only): N/A (no void or bm)  Toileting assist Assist for toileting:  (not assessed at eval)     Transfers Chair/bed transfer  Transfers assist     Chair/bed transfer assist level: Minimal Assistance - Patient > 75%     Locomotion Ambulation   Ambulation assist   Ambulation activity did not occur: Safety/medical concerns          Walk 10 feet activity   Assist  Walk 10 feet activity did not occur: Safety/medical concerns        Walk 50 feet activity   Assist Walk 50 feet with 2 turns activity did not occur: Safety/medical concerns         Walk 150 feet activity   Assist Walk 150 feet  activity did not occur: Safety/medical concerns         Walk 10 feet on uneven surface  activity   Assist Walk 10 feet on uneven surfaces activity did not occur: Safety/medical concerns         Wheelchair     Assist Will patient use wheelchair at discharge?: Yes Type of Wheelchair: Manual    Wheelchair assist level: Supervision/Verbal cueing Max wheelchair distance: 110f    Wheelchair 50 feet with 2 turns activity    Assist        Assist Level: Supervision/Verbal cueing   Wheelchair 150 feet activity     Assist      Assist Level: Supervision/Verbal cueing   Blood pressure (!) 141/72, pulse (!) 51, temperature 98.4 F (36.9 C), temperature source Oral, resp. rate 16, height '6\' 2"'$  (1.88 m), weight 120.1 kg, SpO2 94 %.  Medical Problem List and Plan: 1.  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  Wound VAC as directed             -patient may  Shower once VAC off- and R BKA covered             -ELOS/Goals: 14-18 days- mod I to supervision  -Continue CIR 2.  Antithrombotics: -DVT/anticoagulation: SCDs left lower extremity             -antiplatelet therapy: Continue Aspirin 81 mg daily 3. Pain Management: Lyrica 50 mg 3 times daily, Robaxin and oxycodone as needed- add Duloxetine 20 mg QHS for nerve pain  2/27- no side effects so far- con't regimen  2/28: waking at night for pain medication. Add Oxycontin '10mg'$  BID, discussed how this works and using less PRN medication.   3/1: shrinker is painful for patient- discussed with Sarah OT applying ace wrap for now and I have ordered a new shrinker.   3/2: Stop log-acting oxycodone given that it did not help significantly and because patient has history of opioid addiction  3/3: Please try TENS and mirror therapy- will message therapy. Will discuss with Dr. DJess Bartersteam nerve block and nitroglycerin patch as options.   3/4: will  discuss plan for opioid taper with patient: 3/4: oxycodone '10mg'$  4H prn, 3/7: oxycodone '5mg'$  q4H prn, 3/9: oxycodone '5mg'$  q6H prn, 3/12: oxycodone '5mg'$  q8H prn, 3/15: oxycodone '5mg'$  q12H prn, 3/16: oxycodone '5mg'$  daily prn, 3/17: discharge- no opioids upon discharge.  4. Mood: Provide emotional support             -antipsychotic agents: N/A 5. Neuropsych: This patient is capable  of making decisions on his own behalf. 6. Skin/Wound Care: Routine skin checks. May d/c wound vac 2/28 7. Fluids/Electrolytes/Nutrition: Routine in and outs with follow-up chemistries 8.  Acute on chronic anemia.  Continue Niferex.    3/3: Hgb reviewed, 8.3, repeat Monday  9.  AKI/ATN.  Follow-up nephrology services.  No current plan for hemodialysis- Current Cr 5.76  3/3: discussed etiologies of his ATN: Cr up to 8.5, appreciate nephrology following, note reviewed. Repeat CMP tomorrow.  10.  Hypertension.  Hydralazine 50 mg twice daily, Norvasc 10 mg daily, clonidine 0.1 mg twice daily, Tenormin 50 mg daily.  Monitor with increased mobility  3/4: systolic BP elevated on last 5 reads: increase clonidine to .'2mg'$  (can also help with pain) 11.  Diabetes mellitus with peripheral neuropathy.  Hemoglobin A1c 10.5.  NovoLog 4 units 3 times daily, Lantus insulin 35 units nightly.  Check blood sugars before meals and at bedtime  2/27- BGs 127-160- con't regimen for now-  12.  Hyperlipidemia.  Lovaza 13.  Urinary retention with scrotal edema.  Flomax 0.4 mg daily.  Continue Foley tube for now until scrotal edema improves and then plan voiding trial.  2/27- elevating on hand towel- is working- also , Renal giving 80 mg IV Lasix x1 today- should also help LE edema/and scrotal edema.  14.  History of tobacco abuse.  Counseling 15. Protein malnutrition- severe- Alb 1.5- cause of edema likely 16. LE edema- severe- don't see Diuretic- likely due to Renal issues- elevating scrotum- con't regimen  2/27- see #13  3/4: improving 17. Insomnia-  try Trazodone 25-50 mg QHS for sleep   2/27- working- con't regimen 18. Transaminitis: stable, continue tylenol '650mg'$  TID with daily LFT check. 19. Bradycardia:now symptomatic , reduce atenolol, per nephro will get cardiology to eval as well  continue to monitor TID   20.  Shortness of breath worsened with sitting forward.  He does have anasarca and certainly would expect some pleural effusions.  Nephrology is aggressively diuresing.  In addition he does have bradycardia.  As per nephrology recommendations will ask for cardiology assistance. We will check EKG, chest x-ray, reviewed labs from this morning no concerns.      LOS: 8 days A FACE TO FACE EVALUATION WAS PERFORMED  Fernando Anthony 01/24/2021, 12:04 PM

## 2021-01-24 NOTE — Progress Notes (Incomplete)
Patient placed on 2L of Oxygen per Dr. Marval Regal request.  Patient and spouse educated on 1500 mL fluid restriction.  Patient and spouse stated, "We weren't told about the restriction."  RN gave them examples of what 15

## 2021-01-24 NOTE — Progress Notes (Signed)
Patient c/o shortness of breath and unable to catch his breath.  BP is 141/72, HR - 51, RR - 16, O2 94% on room air.  Patient seems anxious regarding the shortness of breath.  Patient states, "When I sit up the shortness of breath is worse.  It feels like I am being sandwiched together."  RN informed Dr. Marval Regal.  New orders entered for CXR and 1 dose of Albumin.  RN will notify Dr. Letta Pate as well for any additional orders.

## 2021-01-24 NOTE — Plan of Care (Signed)
  Problem: RH Car Transfers Goal: LTG Patient will perform car transfers with assist (PT) Description: LTG: Patient will perform car transfers with assistance (PT). Flowsheets (Taken 01/24/2021 1507) LTG: Pt will perform car transfers with assist:: (downgraded due to pain, generalized weakness, poor endurance, and decreased balance/postural control) Contact Guard/Touching assist Note: downgraded due to pain, generalized weakness, poor endurance, and decreased balance/postural control   Problem: RH Ambulation Goal: LTG Patient will ambulate in controlled environment (PT) Description: LTG: Patient will ambulate in a controlled environment, # of feet with assistance (PT). Flowsheets (Taken 01/24/2021 1507) LTG: Pt will ambulate in controlled environ  assist needed:: (downgraded due to pain, generalized weakness, poor endurance, and decreased balance/postural control) Contact Guard/Touching assist LTG: Ambulation distance in controlled environment: 34f Note: downgraded due to pain, generalized weakness, poor endurance, and decreased balance/postural control

## 2021-01-24 NOTE — Progress Notes (Addendum)
Patient ID: Fernando Anthony, male   DOB: March 18, 1956, 65 y.o.   MRN: [id number] S: had marked increase in his UF overnight following iv albumin.  SOB initially improved but now nsg telling me he is more sob. O:BP (!) 141/72 (BP Location: Left Arm)   Pulse (!) 51   Temp 98.4 F (36.9 C) (Oral)   Resp 16   Ht '6\' 2"'$  (1.88 m)   Wt 120.1 kg   SpO2 94%   BMI 33.99 kg/m   Intake/Output Summary (Last 24 hours) at 01/24/2021 1135 Last data filed at 01/24/2021 0724 Gross per 24 hour  Intake 720 ml  Output 2750 ml  Net -2030 ml   Intake/Output: I/O last 3 completed shifts: In: 49 [P.O.:720] Out: 4750 [Urine:4750]  Intake/Output this shift:  Total I/O In: 240 [P.O.:240] Out: -  Weight change:  Gen: NAD CVS: Bradycardic at 51 Resp: cta anteriorly, diminished BS at bases Abd: +BS, soft, NT/ND Ext: s/p RBKA, 1+ edema of LLE and presacrum.  Recent Labs  Lab 01/17/21 1217 01/18/21 0209 01/19/21 0426 01/20/21 0430 01/21/21 0253 01/22/21 0608 01/23/21 0216 01/24/21 0235  NA 135 133* 133* 134* 137 135 135 136  K 4.4 4.4 4.5 4.5 5.0 4.7 4.9 5.3*  CL 108 105 104 107 106 110 111 109  CO2 16* 16* 18* 18* 19* 16* 14* 16*  GLUCOSE 113* 144* 142* 135* 112* 107* 88 148*  BUN 73* 73* 75* 77* 79* 79* 79* 78*  CREATININE 5.64* 5.73* 6.00* 5.89* 5.79* 5.85* 5.63* 5.36*  ALBUMIN 1.5* 1.5* 1.6* 1.5* 1.7* 1.7* 1.7* 1.8*  CALCIUM 7.9* 7.9* 8.0* 7.8* 8.3* 8.1* 8.1* 8.2*  PHOS 7.6*  --   --   --   --   --   --   --   AST  --  65* 57* 59* 56* 77* 73* 60*  ALT  --  121* 114* 104* 106* 110* 112* 106*   Liver Function Tests: Recent Labs  Lab 01/22/21 0608 01/23/21 0216 01/24/21 0235  AST 77* 73* 60*  ALT 110* 112* 106*  ALKPHOS 602* 605* 579*  BILITOT 4.5* 4.0* 3.7*  PROT 6.1* 6.0* 6.0*  ALBUMIN 1.7* 1.7* 1.8*   No results for input(s): LIPASE, AMYLASE in the last 168 hours. No results for input(s): AMMONIA in the last 168 hours. CBC: Recent Labs  Lab 01/18/21 0209 01/19/21 0426  01/21/21 0253  WBC 10.2 9.2  --   NEUTROABS 7.9*  --   --   HGB 8.0* 8.3* 8.3*  HCT 22.1* 24.4* 23.7*  MCV 89.1 90.7  --   PLT 217 232  --    Cardiac Enzymes: No results for input(s): CKTOTAL, CKMB, CKMBINDEX, TROPONINI in the last 168 hours. CBG: Recent Labs  Lab 01/23/21 0612 01/23/21 1150 01/23/21 1701 01/23/21 2106 01/24/21 0634  GLUCAP 79 115* 139* 140* 153*    Iron Studies: No results for input(s): IRON, TIBC, TRANSFERRIN, FERRITIN in the last 72 hours. Studies/Results: No results found. Marland Kitchen acetaminophen  650 mg Oral Q8H  . amLODipine  10 mg Oral Daily  . aspirin EC  81 mg Oral QHS  . atenolol  25 mg Oral Daily  . Chlorhexidine Gluconate Cloth  6 each Topical Daily  . cloNIDine  0.2 mg Oral BID  . docusate sodium  100 mg Oral BID  . hydrALAZINE  50 mg Oral BID  . insulin aspart  0-15 Units Subcutaneous TID WC  . insulin aspart  2 Units Subcutaneous TID WC  .  insulin glargine  20 Units Subcutaneous Daily  . iron polysaccharides  150 mg Oral Daily  . omega-3 acid ethyl esters  1 g Oral Daily  . pregabalin  50 mg Oral TID  . sevelamer carbonate  800 mg Oral TID WC  . tamsulosin  0.4 mg Oral QPC supper  . traZODone  25-50 mg Oral QHS    BMET    Component Value Date/Time   NA 136 01/24/2021 0235   K 5.3 (H) 01/24/2021 0235   CL 109 01/24/2021 0235   CO2 16 (L) 01/24/2021 0235   GLUCOSE 148 (H) 01/24/2021 0235   BUN 78 (H) 01/24/2021 0235   CREATININE 5.36 (H) 01/24/2021 0235   CALCIUM 8.2 (L) 01/24/2021 0235   GFRNONAA 11 (L) 01/24/2021 0235   GFRAA >60 11/29/2019 2137   CBC    Component Value Date/Time   WBC 9.2 01/19/2021 0426   RBC 2.69 (L) 01/19/2021 0426   HGB 8.3 (L) 01/21/2021 0253   HCT 23.7 (L) 01/21/2021 0253   PLT 232 01/19/2021 0426   MCV 90.7 01/19/2021 0426   MCH 30.9 01/19/2021 0426   MCHC 34.0 01/19/2021 0426   RDW 19.1 (H) 01/19/2021 0426   LYMPHSABS 1.1 01/18/2021 0209   MONOABS 1.0 01/18/2021 0209   EOSABS 0.2 01/18/2021 0209    BASOSABS 0.1 01/18/2021 0209    Assessment/Plan:  1. AKI/CKD stage IIIa: b/l creat from mid 2021 was 1.09. Here creat 4.0 on admit 2/15, then peaked at 5.96 on 2/21. Suspect ATN but urinary retention as below. Renal ultrasound consistent with medical renal disease. Lisinopril, HCTZ dc'd. Admitted to taking NSAIDs prior to admission and had been on multiple antibiotics over the past several weeks. No indication for RRT at this time. Essentially stable but quite impaired and has tolerated lasix. complements were WNL and negative SPEP. 1. Gave dose of Lasix 80 mg IV x 12/27/22with goodresponse and has continued to diurese on his own without much change in Scr. 2. Stoppedcymbalta with renal failure 3. No uremic symptoms. 4. BUN/Cr slowly improving over the past 3 days as well as UOP. 5. Continue to follow UOP and Scr. 6. Will dose again with IV albumin as he had a brisk diuresis overnight.  7. Will also check 24 hour urine protein, if nephrotic may want to proceed with renal biopsy given persistent ATN/AKI but proteinuria likely due to longstanding poorly controlled DM. 2. Bradycardia - somewhat symptomatic.  Atenolol held and is also on amlodipine.  Recommend cardiology evaluation and suggestions on management as we are avoiding ACE/ARB with AKI/CKD especially now with worsening SOB. 3. SOB - will check CXR but has had a significant diuresis over the past few days.  RR only 16.  Unclear etiology but concerning for pleural effusions (although it is when he sits up he is SOB).  May benefit from oxygen and thoracentesis pending CXR.  4. 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 Per orthopedics.  5. [id number] held. Acceptable control  6. Anemia- Secondary to chronic illness. Tsat low, holding IV Fe in the setting of active infection. AKI contributing as well. aranesp 40 mcg once on 2/23. On oral iron. PRBC's on 2/26 ordered   7. hyperphosphatemia - renal diet. Started renvela as binder  8. DM - Insulin per primary team.  9. Urinary retention: new per patient the last couple of weeks.Nowon flomax. Now with foley and continue same. Assess voiding trial soon - has been on flomax 10. Severe  protein malnutrition - will need protein supplementation. 11. Disposition - cont with PT/OT 12. Abnormal LFT's - slowly improving. Possible shock liver.RUQ [place] without focal liver lesion or evidence of cirrhosis.  Will check hepatitis panel.   Donetta Potts, MD [employer] [phone]

## 2021-01-24 NOTE — Progress Notes (Signed)
Dr. Letta Pate informed on order from cardiology NP stating patient to have cardiac monitoring until discharge.  Dr. Letta Pate stated "I will have to call cardiology and let them know we don't do cardiac monitoring."  Dr. Letta Pate called back and stated, "Spoke to cardiology who were not aware rehab does not do telemetry. Dr Marlou Porch will d/c the order and just d/c the atenolol.  They will monitor vital signs."    No new orders at this time.

## 2021-01-24 NOTE — Consult Note (Addendum)
Cardiology Consultation:   Patient ID: Fernando Anthony; [id number]; [birth year]   Admit date: 01/16/2021 Date of Consult: 01/24/2021  Primary Care Provider: Curly Rim, MD Primary Cardiologist: New to [facility]   Patient Profile:   Fernando Anthony is a 65 y.o. male with a hx of insulin dependent DM, diabetic right foot ulcer with PVD, HTN, and HLD who is being seen today for the evaluation of bradycardia at the request of Dr. Letta Pate.  History of Present Illness:   Fernando Anthony is a 65yo M with a hx as stated above who presented to [facility] with calcaneal osteomyelitis.   He has been followed for quite some time for persistent peripheral vascular disease with multiple digit toe amputations. He began having persistent issues with right plantar ulcerations 01/04/2021 that was associated with nausea, malaise and scrotal swelling. Given the symptoms, he followed with his podiatrist who performed an MRI that showed right foot osteomyelitis with tissue necrosis. The patient initially underwent an I&D with antibiotic bead placement 01/06/2021 with persistent infection and inadequate improvement and therefore subsequently underwent right below the knee amputation 01/11/2021 with wound VAC placement.  He was followed by orthopedics and internal medicine at that time and placed on aggressive IV antibiotic therapy.  His hospitalization was complicated by acute kidney injury and acute liver failure. He underwent an abdominal right upper quadrant ultrasound that showed gallbladder fluid with no evidence of stone. His bilirubin and transaminases remained elevated however were stable at hospital discharge 01/16/21 to rehab with a stable INR. Nephrology was consulted for AKI and diuretic management. Baseline creatinine was noted to be around 1.2 with a peak Cr at >5.0.  Kidney ultrasound was consistent with medical renal disease with no evidence of hydronephrosis.  He was ultimately discharged to inpatient rehabilitation  01/16/21.   During his rehabilitation course nephrology has continued to follow given a peak creatinine at 5.96 on 01/11/2021 suspected for ATN due to urinary retention in the postoperative setting.  AKI persistently improved over the course of the last several weeks. He was given IV albumin in hopes to improve his urine output with plans to check a 24-hour urine protein and possibly proceed with renal biopsy.On evaluation today, he was found to be bradycardic with a HR at 51bpm per EKG and has had progressive SOB since yesterday despite diuresis. His atenolol and amlodipine were held. CXR ordered however not yet performed.    On my exam, he states that he was previously doing well with transferring to his wheelchair and moving around his room. He states he would get SOB however would recover with rest. He states that yesterday he noticed that the SOB was worse with leaning forward but tells me today that this has now progressed to worsing with lying flat. He is on [place] supplemental oxygen today and appears comfortable. His lung exam reveals very diminished lower lobes bilaterally despite deep inspiration. He denies chest pain or other anginal symptoms. He is not currently on telemetry for monitoring.   Past Medical History:  Diagnosis Date   Back pain    Diabetes mellitus    Headache(784.0)    general   Hyperlipidemia    Hypertension    Osteomyelitis ([place]) 11/2018   RIGHT FOOT    Past Surgical History:  Procedure Laterality Date   AMPUTATION Right 09/18/2013   Procedure: Amputation Right Great Toe at [facility];  Surgeon: Newt Minion, MD;  Location: [place];  Service: Orthopedics;  Laterality: Right;  Amputation  Right Great Toe at MTP   AMPUTATION Right 12/05/2018   Procedure: PARTIAL AMPUTATION FIRST RAY RIGHT FOOT;  Surgeon: Edrick Kins, DPM;  Location: [place];  Service: Podiatry;  Laterality: Right;   AMPUTATION Right 01/11/2021   Procedure: AMPUTATION BELOW KNEE;  Surgeon: Newt Minion, MD;   Location: [place];  Service: Orthopedics;  Laterality: Right;   APPLICATION OF WOUND VAC Right 01/11/2021   Procedure: APPLICATION OF WOUND VAC;  Surgeon: Newt Minion, MD;  Location: [place];  Service: Orthopedics;  Laterality: Right;   BONE BIOPSY Right 01/06/2021   Procedure: BONE BIOPSY;  Surgeon: Edrick Kins, DPM;  Location: [facility];  Service: Podiatry;  Laterality: Right;   CARDIAC CATHETERIZATION  ?1990   INCISION AND DRAINAGE Right 01/06/2021   Procedure: INCISION AND DRAINAGE, ANTIBIOTIC BEAD PLACEMENT;  Surgeon: Edrick Kins, DPM;  Location: [facility];  Service: Podiatry;  Laterality: Right;   SPINE SURGERY       Prior to Admission medications   Medication Sig Start Date End Date Taking? Authorizing Provider  acetaminophen (TYLENOL) 325 MG tablet Take 1-2 tablets (325-650 mg total) by mouth every 6 (six) hours as needed for mild pain (pain score 1-3 or temp > 100.5). 01/16/21  Yes Barb Merino, MD  amLODipine (NORVASC) 10 MG tablet Take 10 mg by mouth daily.   Yes [provider]  aspirin 81 MG tablet Take 81 mg by mouth at bedtime.    Yes [provider]  atenolol (TENORMIN) 50 MG tablet Take 50 mg by mouth daily.   Yes [provider]  cloNIDine (CATAPRES) 0.1 MG tablet Take 0.1 mg by mouth 2 (two) times daily. 01/31/19  Yes [provider]  hydrALAZINE (APRESOLINE) 25 MG tablet Take 12.5 mg by mouth 2 (two) times daily.   Yes [provider]  hydrochlorothiazide (HYDRODIURIL) 12.5 MG tablet Take 12.5 mg by mouth daily.   Yes [provider]  Insulin Aspart, w/Niacinamide, (FIASP) 100 UNIT/ML SOLN Inject 6 Units into the skin 3 (three) times daily.   Yes [provider]  insulin glargine, 2 Unit Dial, (TOUJEO MAX SOLOSTAR) 300 UNIT/ML Solostar Pen Inject 40 Units into the skin at bedtime.   Yes [provider]  iron polysaccharides (NIFEREX) 150 MG capsule Take 1 capsule (150 mg total) by mouth daily. 01/17/21  Yes  Barb Merino, MD  lisinopril (ZESTRIL) 40 MG tablet Take 40 mg by mouth every evening.   Yes [provider]  methocarbamol (ROBAXIN) 500 MG tablet Take 1 tablet (500 mg total) by mouth every 6 (six) hours as needed for muscle spasms. 01/16/21  Yes Barb Merino, MD  Omega-3 Fatty Acids (FISH OIL) 500 MG CAPS Take 500 mg by mouth at bedtime.    Yes [provider]  oxyCODONE 10 MG TABS Take 1 tablet (10 mg total) by mouth every 4 (four) hours as needed for severe pain (pain score 7-10). 01/16/21  Yes Ghimire, Dante Gang, MD  polyethylene glycol (MIRALAX / GLYCOLAX) 17 g packet Take 17 g by mouth daily as needed for mild constipation (PRN for DAYTIME - has separate Senokot-S for bedtime prn). 01/16/21  Yes Barb Merino, MD  pregabalin (LYRICA) 50 MG capsule Take 50 mg by mouth 3 (three) times daily.   Yes [provider]  rosuvastatin (CRESTOR) 20 MG tablet Take 20 mg by mouth daily.   Yes [provider]  senna-docusate (SENOKOT-S) 8.6-50 MG tablet Take 1 tablet by mouth at bedtime as  needed for mild constipation. 01/16/21  Yes Barb Merino, MD  sevelamer carbonate (RENVELA) 800 MG tablet Take 1 tablet (800 mg total) by mouth 3 (three) times daily with meals. 01/16/21  Yes Barb Merino, MD  sodium chloride (OCEAN) 0.65 % SOLN nasal spray Place 1 spray into both nostrils daily as needed for congestion.   Yes [provider]  tamsulosin (FLOMAX) 0.4 MG CAPS capsule Take 1 capsule (0.4 mg total) by mouth daily after supper. 01/16/21  Yes Barb Merino, MD    Inpatient Medications: Scheduled Meds:  acetaminophen  650 mg Oral Q8H   amLODipine  10 mg Oral Daily   aspirin EC  81 mg Oral QHS   atenolol  25 mg Oral Daily   Chlorhexidine Gluconate Cloth  6 each Topical Daily   cloNIDine  0.2 mg Oral BID   docusate sodium  100 mg Oral BID   hydrALAZINE  50 mg Oral BID   insulin aspart  0-15 Units Subcutaneous TID WC   insulin aspart  2 Units Subcutaneous TID  WC   insulin glargine  20 Units Subcutaneous Daily   iron polysaccharides  150 mg Oral Daily   omega-3 acid ethyl esters  1 g Oral Daily   pregabalin  50 mg Oral TID   sevelamer carbonate  800 mg Oral TID WC   tamsulosin  0.4 mg Oral QPC supper   traZODone  25-50 mg Oral QHS   Continuous Infusions:  methocarbamol (ROBAXIN) IV     PRN Meds: bisacodyl, hydrocortisone cream, methocarbamol **OR** methocarbamol (ROBAXIN) IV, ondansetron **OR** ondansetron (ZOFRAN) IV, oxyCODONE, oxyCODONE, polyethylene glycol, senna-docusate, witch hazel-glycerin  Allergies:    Allergies  Allergen Reactions   Vicodin [Hydrocodone-Acetaminophen] Itching    Social History:   Social History   Socioeconomic History   Marital status: Married    Spouse name: Not on file   Number of children: Not on file   Years of education: Not on file   Highest education level: Not on file  Occupational History   Not on file  Tobacco Use   Smoking status: Current Every Day Smoker    Packs/day: 1.00    Years: 40.00    Pack years: 40.00    Types: Cigarettes   Smokeless tobacco: Never Used  Scientific laboratory technician Use: Former  Substance and Sexual Activity   Alcohol use: Not Currently   Drug use: Not Currently    Comment: past use of marijuana   Sexual activity: Not on file  Other Topics Concern   Not on file  Social History Narrative   Not on file   Social Determinants of Health   Financial Resource Strain: Not on file  Food Insecurity: Not on file  Transportation Needs: Not on file  Physical Activity: Not on file  Stress: Not on file  Social Connections: Not on file  Intimate Partner Violence: Not on file    Family History:   Family History  Problem Relation Age of Onset   Cancer Mother    Heart disease Father    Family Status:  Family Status  Relation Name Status   Mother  Deceased   Father  Deceased    ROS:  Please see the history of present illness.  All other ROS reviewed and  negative.     Physical Exam/Data:   Vitals:   01/23/21 2011 01/23/21 2217 01/24/21 0441 01/24/21 1125  BP: (!) 172/89 (!) 156/79 (!) 167/75 (!) 141/72  Pulse:  (!) 52 Marland Kitchen)  57 (!) 51  Resp:  '14 14 16  '$ Temp:  98.3 F (36.8 C) 98.6 F (37 C) 98.4 F (36.9 C)  TempSrc:  Oral Oral Oral  SpO2:  93% 91% 94%  Weight:      Height:        Intake/Output Summary (Last 24 hours) at 01/24/2021 1215 Last data filed at 01/24/2021 0724 Gross per 24 hour  Intake 720 ml  Output 2750 ml  Net -2030 ml   Filed Weights   01/16/21 1815  Weight: 120.1 kg   Body mass index is 33.99 kg/m.   General: Ill appearing, NAD Neck: Negative for carotid bruits.  Lungs: Diminished in bilateral lower lobes. No wheezes, rales, or rhonchi. Breathing is unlabored. Cardiovascular: RRR with S1 S2. No murmurs Abdomen: Soft, non-tender, distended. No obvious abdominal masses. Extremities: No edema. Radial pulses 2+ bilaterally Neuro: Alert and oriented. No focal deficits. No facial asymmetry. MAE spontaneously. Psych: Responds to questions appropriately with normal affect.     EKG:  The EKG was personally reviewed and demonstrates: 01/24/21 SB with HR at 51bpm and PACs with no acute changes  Telemetry:  Telemetry was personally reviewed and demonstrates: Not currently on telemetry   Relevant CV Studies:  ECHO 01/12/21:   1. Left ventricular ejection fraction, by estimation, is 55 to 60%. The  left ventricle has normal function. The left ventricle has no regional  wall motion abnormalities. There is mild left ventricular hypertrophy.  Left ventricular diastolic parameters  are indeterminate.   2. Right ventricular systolic function is normal. The right ventricular  size is normal. There is moderately elevated pulmonary artery systolic  pressure. The estimated right ventricular systolic pressure is [id number] mmHg.   3. Left atrial size was mildly dilated.   4. Right atrial size was mildly dilated.   5. The mitral  valve is normal in structure. No evidence of mitral valve  regurgitation.   6. The aortic valve was not well visualized. Aortic valve regurgitation  is not visualized. No aortic stenosis is present.   7. The inferior vena cava is dilated in size with <50% respiratory  variability, suggesting right atrial pressure of 15 mmHg.   CATH:   Laboratory Data:  Chemistry Recent Labs  Lab 01/22/21 0608 01/23/21 0216 01/24/21 0235  NA 135 135 136  K 4.7 4.9 5.3*  CL 110 111 109  CO2 16* 14* 16*  GLUCOSE 107* 88 148*  BUN 79* 79* 78*  CREATININE 5.85* 5.63* 5.36*  CALCIUM 8.1* 8.1* 8.2*  GFRNONAA 10* 11* 11*  ANIONGAP '9 10 11    '$ Total Protein  Date Value Ref Range Status  01/24/2021 6.0 (L) 6.5 - 8.1 g/dL Final   Albumin  Date Value Ref Range Status  01/24/2021 1.8 (L) 3.5 - 5.0 g/dL Final   AST  Date Value Ref Range Status  01/24/2021 60 (H) 15 - 41 U/L Final   ALT  Date Value Ref Range Status  01/24/2021 106 (H) 0 - 44 U/L Final   Alkaline Phosphatase  Date Value Ref Range Status  01/24/2021 579 (H) 38 - 126 U/L Final   Total Bilirubin  Date Value Ref Range Status  01/24/2021 3.7 (H) 0.3 - 1.2 mg/dL Final   Hematology Recent Labs  Lab 01/18/21 0209 01/19/21 0426 01/21/21 0253  WBC 10.2 9.2  --   RBC 2.48* 2.69*  --   HGB 8.0* 8.3* 8.3*  HCT 22.1* 24.4* 23.7*  MCV 89.1 90.7  --  MCH 32.3 30.9  --   MCHC 36.2* 34.0  --   RDW 18.3* 19.1*  --   PLT 217 232  --    Cardiac EnzymesNo results for input(s): TROPONINI in the last 168 hours. No results for input(s): TROPIPOC in the last 168 hours.  BNPNo results for input(s): BNP, PROBNP in the last 168 hours.  DDimer No results for input(s): DDIMER in the last 168 hours. TSH: No results found for: TSH Lipids:No results found for: CHOL, HDL, LDLCALC, LDLDIRECT, TRIG, CHOLHDL HgbA1c: Lab Results  Component Value Date   HGBA1C 10.5 (H) 01/04/2021    Radiology/Studies:  No results found.  Assessment and  Plan:   1.  Bradycardia: -Cardiology asked to evaluate given bradycardia which seems to be persistent>>He is not currently monitored on telemetry therefore difficult to determine baseline rates and to fully evaluate for arrhthymias of pausing although I don not suspect this  -EKG today, 01/25/2019 with sinus bradycardia at 51 bpm with PACs and no acute changes -CXR ordered however is pending  -Echo from 01/12/21 showed LVEF at 55-60% with LVH and indeterminate diastolic parameters, moderately elevated PA systolic pressure at [id number] and no valvular disease  -Place on telemetry -will hold atenolol for now until this can be fully assessed. -Follow chest CXR -No indication for PPM placement at this time. Would continue to follow closely and assess for other etiologies for his SOB   2.  Shortness of breath: -On my exam, he states that he was previously doing well with transferring to his wheelchair and moving around his room. He states he would get SOB however would recover with rest. He states that yesterday he noticed that the SOB was worse with leaning forward but tells me today that this has now progressed to worsing with lying flat. He is on [place] supplemental oxygen today and appears comfortable. His lung exam reveals very diminished lower lobes bilaterally despite deep inspiration. He denies chest pain or other anginal symptoms. He is not currently on telemetry for monitoring. He has been aggressively diuresed per nephrology. Will leave further diuretic management up to them   3.  Acute on chronic kidney injury>> ATN: -Has been followed closely during his hospital and rehabilitation course by nephrology.  He was initially seen for AKI> ATN with peak creatinine levels at 6.00>>>5.36 today. He has been treated with IV diuretics along with albumin with adequate urine output. AKI felt to be secondary to postoperative urinary retention.  Plan for IV albumin again today along with 24-hour urine protein per  nephrology team. Continue to avoid nephrotoxic medications.   4.  Uncontrolled DM 2 with peripheral vascular disease: -Hemoglobin A1c greater than 10.0 on hospital presentation currently being treated with SSI per primary team -Needs close outpatient following  5.  Right foot osteomyelitis: -Longstanding PVD with multiple toe amputations who follows outpatient with podiatry.  Prior to hospitalization had worsening right lower extremity pain with associated malaise, nausea and fever found to have osteomyelitis per MRI.  He was hospitalized and was initially treated with I&D and antibiotic bead placement however due to persistent infection, he underwent right below the knee amputation -Wound care and pain management per primary team  Other medical issues per primary team include: -Anemia -Hyper phosphatemia -Urinary retention -Severe protein malnutrition -Abnormal LFTs  For questions or updates, please contact [facility] Please consult [email or web address] for contact info under Cardiology/STEMI.   SignedKathyrn Drown NP-C [facility] Pager: [phone] 01/24/2021 12:15 PM

## 2021-01-25 DIAGNOSIS — N184 Chronic kidney disease, stage 4 (severe): Secondary | ICD-10-CM

## 2021-01-25 DIAGNOSIS — I5033 Acute on chronic diastolic (congestive) heart failure: Secondary | ICD-10-CM

## 2021-01-25 LAB — CBC
HCT: 22.8 % — ABNORMAL LOW (ref 39.0–52.0)
Hemoglobin: 7.7 g/dL — ABNORMAL LOW (ref 13.0–17.0)
MCH: 31.7 pg (ref 26.0–34.0)
MCHC: 33.8 g/dL (ref 30.0–36.0)
MCV: 93.8 fL (ref 80.0–100.0)
Platelets: 182 10*3/uL (ref 150–400)
RBC: 2.43 MIL/uL — ABNORMAL LOW (ref 4.22–5.81)
RDW: 21.9 % — ABNORMAL HIGH (ref 11.5–15.5)
WBC: 6.1 10*3/uL (ref 4.0–10.5)
nRBC: 0 % (ref 0.0–0.2)

## 2021-01-25 LAB — COMPREHENSIVE METABOLIC PANEL
ALT: 101 U/L — ABNORMAL HIGH (ref 0–44)
AST: 54 U/L — ABNORMAL HIGH (ref 15–41)
Albumin: 1.9 g/dL — ABNORMAL LOW (ref 3.5–5.0)
Alkaline Phosphatase: 539 U/L — ABNORMAL HIGH (ref 38–126)
Anion gap: 9 (ref 5–15)
BUN: 79 mg/dL — ABNORMAL HIGH (ref 8–23)
CO2: 15 mmol/L — ABNORMAL LOW (ref 22–32)
Calcium: 8.3 mg/dL — ABNORMAL LOW (ref 8.9–10.3)
Chloride: 111 mmol/L (ref 98–111)
Creatinine, Ser: 5.3 mg/dL — ABNORMAL HIGH (ref 0.61–1.24)
GFR, Estimated: 11 mL/min — ABNORMAL LOW (ref >60)
Glucose, Bld: 93 mg/dL (ref 70–99)
Potassium: 5.1 mmol/L (ref 3.5–5.1)
Sodium: 135 mmol/L (ref 135–145)
Total Bilirubin: 3.1 mg/dL — ABNORMAL HIGH (ref 0.3–1.2)
Total Protein: 5.9 g/dL — ABNORMAL LOW (ref 6.5–8.1)

## 2021-01-25 LAB — GLUCOSE, CAPILLARY
Glucose-Capillary: 140 mg/dL — ABNORMAL HIGH (ref 70–99)
Glucose-Capillary: 79 mg/dL (ref 70–99)
Glucose-Capillary: 57 mg/dL — ABNORMAL LOW (ref 70–99)
Glucose-Capillary: 60 mg/dL — ABNORMAL LOW (ref 70–99)
Glucose-Capillary: 95 mg/dL (ref 70–99)
Glucose-Capillary: 124 mg/dL — ABNORMAL HIGH (ref 70–99)

## 2021-01-25 LAB — PHOSPHORUS: Phosphorus: 6.9 mg/dL — ABNORMAL HIGH (ref 2.5–4.6)

## 2021-01-25 LAB — PROTEIN, URINE, 24 HOUR
Collection Interval-UPROT: 24 hours
Protein, 24H Urine: 4350 mg/d — ABNORMAL HIGH (ref 50–100)
Protein, Urine: 174 mg/dL
Urine Total Volume-UPROT: 2500 mL

## 2021-01-25 LAB — IRON AND TIBC
Iron: 49 ug/dL (ref 45–182)
Saturation Ratios: 18 % (ref 17.9–39.5)
TIBC: 276 ug/dL (ref 250–450)
UIBC: 227 ug/dL

## 2021-01-25 LAB — FERRITIN: Ferritin: 290 ng/mL (ref 24–336)

## 2021-01-25 MED ORDER — DARBEPOETIN ALFA 40 MCG/0.4ML IJ SOSY
40.0000 ug | PREFILLED_SYRINGE | INTRAMUSCULAR | Status: DC
  Administered 2021-01-27: 40 ug via SUBCUTANEOUS
  Filled 2021-01-25: qty 0.4

## 2021-01-25 MED ORDER — CHLORHEXIDINE GLUCONATE CLOTH 2 % EX PADS
6.0000 | MEDICATED_PAD | Freq: Two times a day (BID) | CUTANEOUS | Status: DC
  Administered 2021-01-25 (×2): 6 via TOPICAL

## 2021-01-25 MED ORDER — ALBUMIN HUMAN 25 % IV SOLN
25.0000 g | Freq: Once | INTRAVENOUS | Status: AC
  Administered 2021-01-25: 25 g via INTRAVENOUS
  Filled 2021-01-25: qty 100

## 2021-01-25 MED ORDER — SODIUM CHLORIDE 0.9 % IV SOLN
510.0000 mg | INTRAVENOUS | Status: DC
  Administered 2021-01-25: 510 mg via INTRAVENOUS
  Filled 2021-01-25: qty 17

## 2021-01-25 NOTE — Progress Notes (Signed)
Occupational Therapy Weekly Progress Note  Patient Details  Name: Fernando Anthony MRN: [id number] Date of Birth: 14-May-1956  Beginning of progress report period: January 17, 2021 End of progress report period: January 25, 2021  Today's Date: 01/25/2021 OT Individual Time: 8657-8469 OT Individual Time Calculation (min): 24 min  and Today's Date: 01/25/2021 OT Missed Time: 21 Minutes Missed Time Reason: Patient unwilling/refused to participate without medical reason   Patient has met 2 of 4 short term goals.  Pt is making slow progress towards goals due to limited participation secondary to extreme pain.  Pt is fearful of movements due to reports of increased pain and reports of "blood rushing" sensation when standing and RLE in dependent position.  MD is working on alternative medicine options while therapist has begun education on various pain management strategies with massage, repositioning, and mirror therapy.  Pt continues to perseverate on severe pain and limits participation in OOB therapy sessions, therefore limiting progress towards goals.  Pt currently requires mod assist for squat pivot/lateral scoot transfers w/c <> toilet.  Pt continues to prefer having wife assist with self-care tasks therefore have not been able to focus on any aspects of bathing or dressing.  Will need to arrange to have wife present for self-care sessions vs simulating.  Pt has progressed to ability to complete sit > stand with RW with min assist, when pt willing to participate in standing at EOB.  Pt has demonstrated ability to tolerate shrinker >2 hours, however continue to trial various shrinkers as each do not fit well due to extensive swelling.  Patient continues to demonstrate the following deficits:   muscle weakness, decreased cardiorespiratoy endurance, impaired timing and sequencing, unbalanced muscle activation and decreased coordination, decreased motor planning and decreased sitting balance, decreased standing  balance, decreased postural control and decreased balance strategies and therefore will continue to benefit from skilled OT intervention to enhance overall performance with BADL and Reduce care partner burden.  Patient progressing toward long term goals..  Continue plan of care.  OT Short Term Goals Week 1:  OT Short Term Goal 1 (Week 1): Pt will perform BSC/toilet transfer with Min A and LRAD OT Short Term Goal 1 - Progress (Week 1): Progressing toward goal OT Short Term Goal 2 (Week 1): Pt will perform sit > stand in prep for LB ADL with no more than Min A OT Short Term Goal 2 - Progress (Week 1): Met OT Short Term Goal 3 (Week 1): Pt will perform LB dress with AE PRN with no more than Mod A OT Short Term Goal 3 - Progress (Week 1): Progressing toward goal OT Short Term Goal 4 (Week 1): Pt will tolerate wearing shrinker for 2+ hours to decrease edema in residual limb OT Short Term Goal 4 - Progress (Week 1): Met Week 2:  OT Short Term Goal 1 (Week 2): Pt will perform BSC/toilet transfer with Min A and LRAD OT Short Term Goal 2 (Week 2): Pt will perform LB dress with AE PRN with no more than Mod A OT Short Term Goal 3 (Week 2): Pt will complete toileting with Min A OT Short Term Goal 4 (Week 2): Pt will remain OOB for 2 hours to progress towards completing self- care tasks OOB  Skilled Therapeutic Interventions/Progress Updates:    Treatment session with focus on self-care retraining and d/c planning.  Pt received upright in recliner on 2L O2 and connected to IV for albumin.  O2 sats 97% and HR 47-50 at  rest.  Pt declined any mobility due to being hooked to IV.  Pt reports decreased pain and sensation for "blood rushing" in dependent position, however then also stated that he had not done a lot standing recently.  Pt reports improved tolerance of lateral scoot transfers and reports able to complete toilet transfers "with practically no help".  Pt declined toilet transfer practice this session,  again reporting concerns with mobility with IV.  Discussed home setup and w/c accessibility in home at d/c.  Pt reports it should fit through all doorways, but questions if it will fit in bathroom.  Encouraged pt to have wife measure doorways.  Pt reports no concerns with bathing or dressing tasks, stating that his wife is assisting him and that she will be working from home upon d/c and can assist as needed.  Therapist reiterated purpose of OT and POC with focus on increased independence with ADLs.  Pt remained upright in recliner with BLE elevated and all needs in reach.  O2 still at 97% on 2L O2.  Therapy Documentation Precautions:  Precautions Precautions: Fall Precaution Comments: RLE wound vac/ swollen scotum Required Braces or Orthoses: Other Brace Other Brace: R residual limb guard and R shrinker- Pt still not wearing shrinker.  Says it is too painful due to swelling. Encouraged patient to wear shrinker but continues not to. Restrictions Weight Bearing Restrictions: Yes RLE Weight Bearing: Non weight bearing General:   Vital Signs: Therapy Vitals Temp: 98.6 F (37 C) Temp Source: Oral Pulse Rate: (!) 46 Resp: 18 BP: (!) 149/72 Patient Position (if appropriate): Lying Oxygen Therapy SpO2: 95 % Pain: Pain Assessment Pain Scale: 0-10 Pain Score: 4  Pain Type: Surgical pain Pain Location: Leg Pain Orientation: Right Pain Descriptors / Indicators: Aching Pain Frequency: Constant Pain Onset: On-going Patients Stated Pain Goal: 3 Pain Intervention(s): Medication (See eMAR) ADL: ADL Eating: Not assessed Grooming: Not assessed Upper Body Bathing: Contact guard Lower Body Bathing: Maximal assistance Upper Body Dressing: Moderate assistance Where Assessed-Upper Body Dressing: Bed level Lower Body Dressing: Maximal assistance Where Assessed-Lower Body Dressing: Bed level Toileting: Not assessed (pt declined) Toilet Transfer: Not assessed Vision   Perception     Praxis   Exercises:   Other Treatments:     Therapy/Group: Individual Therapy  [name] 01/25/2021, 7:48 AM

## 2021-01-25 NOTE — Progress Notes (Signed)
24 hour urine collection completed. Pt resting skin PWD states feels better. Medicated for pain

## 2021-01-25 NOTE — Progress Notes (Signed)
Patient ID: Fernando Anthony, male   DOB: 10-11-1956, 65 y.o.   MRN: [id number] S: Urine output continues to be very good with 3.5 L yesterday.  Creatinine essentially stable.  Shortness of breath slightly improved today. O:BP (!) 157/81 (BP Location: Left Arm)   Pulse (!) 49   Temp 98.4 F (36.9 C) (Oral)   Resp 16   Ht '6\' 2"'$  (1.88 m)   Wt 120.1 kg   SpO2 97%   BMI 33.99 kg/m   Intake/Output Summary (Last 24 hours) at 01/25/2021 1119 Last data filed at 01/25/2021 0713 Gross per 24 hour  Intake 840 ml  Output 2750 ml  Net -1910 ml   Intake/Output: I/O last 3 completed shifts: In: 720 [P.O.:720] Out: 5400 [Urine:5400]  Intake/Output this shift:  Total I/O In: 360 [P.O.:360] Out: -  Weight change:  Gen: NAD CVS: Bradycardic Resp: Bilateral chest rise, no increased work of breathing Abd: +BS, soft, NT/ND Ext: s/p RBKA, edema present in left lower extremity  Recent Labs  Lab 01/19/21 0426 01/20/21 0430 01/21/21 0253 01/22/21 0608 01/23/21 0216 01/24/21 0235 01/25/21 0424  NA 133* 134* 137 135 135 136 135  K 4.5 4.5 5.0 4.7 4.9 5.3* 5.1  CL 104 107 106 110 111 109 111  CO2 18* 18* 19* 16* 14* 16* 15*  GLUCOSE 142* 135* 112* 107* 88 148* 93  BUN 75* 77* 79* 79* 79* 78* 79*  CREATININE 6.00* 5.89* 5.79* 5.85* 5.63* 5.36* 5.30*  ALBUMIN 1.6* 1.5* 1.7* 1.7* 1.7* 1.8* 1.9*  CALCIUM 8.0* 7.8* 8.3* 8.1* 8.1* 8.2* 8.3*  PHOS  --   --   --   --   --   --  6.9*  AST 57* 59* 56* 77* 73* 60* 54*  ALT 114* 104* 106* 110* 112* 106* 101*   Liver Function Tests: Recent Labs  Lab 01/23/21 0216 01/24/21 0235 01/25/21 0424  AST 73* 60* 54*  ALT 112* 106* 101*  ALKPHOS 605* 579* 539*  BILITOT 4.0* 3.7* 3.1*  PROT 6.0* 6.0* 5.9*  ALBUMIN 1.7* 1.8* 1.9*   No results for input(s): LIPASE, AMYLASE in the last 168 hours. No results for input(s): AMMONIA in the last 168 hours. CBC: Recent Labs  Lab 01/19/21 0426 01/21/21 0253 01/25/21 0424  WBC 9.2  --  6.1  HGB 8.3* 8.3* 7.7*   HCT 24.4* 23.7* 22.8*  MCV 90.7  --  93.8  PLT 232  --  182   Cardiac Enzymes: No results for input(s): CKTOTAL, CKMB, CKMBINDEX, TROPONINI in the last 168 hours. CBG: Recent Labs  Lab 01/24/21 1700 01/24/21 1758 01/24/21 2126 01/25/21 0559 01/25/21 1109  GLUCAP 58* 106* 151* 140* 79    Iron Studies: No results for input(s): IRON, TIBC, TRANSFERRIN, FERRITIN in the last 72 hours. Studies/Results: DG Chest Port 1 View  Result Date: 01/24/2021 CLINICAL DATA:  Shortness of breath EXAM: PORTABLE CHEST 1 VIEW COMPARISON:  January 10, 2021 FINDINGS: The cardiomediastinal silhouette is unchanged and mild enlarged in contour. Likely small RIGHT pleural effusion. No pneumothorax. Perihilar vascular prominence. Patchy bibasilar heterogeneous airspace opacities. Diffuse interstitial prominence. Visualized abdomen is unremarkable. Multilevel degenerative changes of the thoracic spine. IMPRESSION: Constellation of findings are favored to reflect pulmonary edema with scattered atelectasis. Infection could present similarly. Electronically Signed   By: Valentino Saxon MD   On: 01/24/2021 14:52   . acetaminophen  650 mg Oral Q8H  . amLODipine  10 mg Oral Daily  . aspirin EC  81 mg  Oral QHS  . Chlorhexidine Gluconate Cloth  6 each Topical BID  . cloNIDine  0.2 mg Oral BID  . docusate sodium  100 mg Oral BID  . hydrALAZINE  50 mg Oral BID  . insulin aspart  0-15 Units Subcutaneous TID WC  . insulin aspart  2 Units Subcutaneous TID WC  . insulin glargine  20 Units Subcutaneous Daily  . iron polysaccharides  150 mg Oral Daily  . omega-3 acid ethyl esters  1 g Oral Daily  . pregabalin  50 mg Oral TID  . sevelamer carbonate  800 mg Oral TID WC  . tamsulosin  0.4 mg Oral QPC supper  . traZODone  25-50 mg Oral QHS    BMET    Component Value Date/Time   NA 135 01/25/2021 0424   K 5.1 01/25/2021 0424   CL 111 01/25/2021 0424   CO2 15 (L) 01/25/2021 0424   GLUCOSE 93 01/25/2021 0424   BUN  79 (H) 01/25/2021 0424   CREATININE 5.30 (H) 01/25/2021 0424   CALCIUM 8.3 (L) 01/25/2021 0424   GFRNONAA 11 (L) 01/25/2021 0424   GFRAA >60 11/29/2019 2137   CBC    Component Value Date/Time   WBC 6.1 01/25/2021 0424   RBC 2.43 (L) 01/25/2021 0424   HGB 7.7 (L) 01/25/2021 0424   HCT 22.8 (L) 01/25/2021 0424   PLT 182 01/25/2021 0424   MCV 93.8 01/25/2021 0424   MCH 31.7 01/25/2021 0424   MCHC 33.8 01/25/2021 0424   RDW 21.9 (H) 01/25/2021 0424   LYMPHSABS 1.1 01/18/2021 0209   MONOABS 1.0 01/18/2021 0209   EOSABS 0.2 01/18/2021 0209   BASOSABS 0.1 01/18/2021 0209    Assessment/Plan:  1. AKI/CKD stage IIIa: Creatinine as low as 1.1 in 2021 and was 4 on arrival.  Creatinine peaked at 6 and has been very slow to improve.  Was taking NSAIDs prior to arrival but unclear why creatinine has not improved further.  Complements and SPEP are normal.  1. Continue to monitor creatinine trend 2. Give further IV albumin today 3. Follow-up 24-hour protein 4. No uremic symptoms or need for dialysis at this time 5. Could consider kidney biopsy if the creatinine continues to be prove 2. Bradycardia -cardiology following.  Thought to be related to decreased clearance of atenolol given his significant renal insufficiency.  Continue to monitor.  Asymptomatic today 3. SOB -on supplemental oxygen today.  Urine output very good at this time likely will help with volume status.  Could consider thoracentesis 4. 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 Per orthopedics.  5. [id number] held.  Blood pressure elevated at this time currently taking clonidine 0.2 twice daily, hydralazine 50 mg twice daily, amlodipine 10 mg daily.  Hopefully will improve with improving volume status 6. Anemia- Secondary to chronic illness. Tsat low, holding IV Fe in the setting of active infection. AKI contributing as well. aranesp 40 mcg once on 2/23. On oral iron. PRBC's on 2/26  given. Reorder aranesp to be given on Wed given falling hgb. Recheck iron stores (probably far enough out from PRBCs). 7. hyperphosphatemia - renal diet.  Continue Renvela 8. DM - Insulin per primary team.  9. Urinary retention: new per patient the last couple of weeks.Nowon flomax. Now with foley and continue same. Assess voiding trial soon - has been on flomax 10. Severe protein malnutrition - per primary 11. Disposition - cont with PT/OT 12. Abnormal LFT's - slowly improving. Possible shock liver.RUQ  okay. Hepatitis panel negative

## 2021-01-25 NOTE — Progress Notes (Signed)
Physical Therapy Weekly Progress Note  Patient Details  Name: Fernando Anthony MRN: [id number] Date of Birth: 17-Feb-1956  Beginning of progress report period: January 17, 2021 End of progress report period: January 25, 2021  Today's Date: 01/25/2021 PT Individual Time: 0800-0840 PT Individual Time Calculation (min): 40 min   Patient has met 2 of 4 short term goals. Pt demonstrates slow and minimal progress towards long term goals. Pt is currently able to perform bed mobility with supervision, sit<>stand and stand<>pivot transfers with RW and min A, and WC mobility >163f with supervision. Pt continues to be extremely limited by pain, decreased endurance and generalized weakness, and decreased standing balance.   Patient continues to demonstrate the following deficits muscle weakness, decreased cardiorespiratoy endurance and decreased standing balance, decreased postural control and decreased balance strategies and therefore will continue to benefit from skilled PT intervention to increase functional independence with mobility.  Patient progressing toward long term goals..  Continue plan of care.  PT Short Term Goals Week 1:  PT Short Term Goal 1 (Week 1): Pt will perform STS with min A and LRAD. PT Short Term Goal 1 - Progress (Week 1): Met PT Short Term Goal 2 (Week 1): Pt will propel wc  x 150 ft with supervision PT Short Term Goal 2 - Progress (Week 1): Met PT Short Term Goal 3 (Week 1): Pt will be independent with bed mobility. PT Short Term Goal 3 - Progress (Week 1): Progressing toward goal PT Short Term Goal 4 (Week 1): Pt will iniate gait training with LRAD. PT Short Term Goal 4 - Progress (Week 1): Progressing toward goal Week 2:  PT Short Term Goal 1 (Week 2): pt will transfer sit<>stand with LRAD and CGA PT Short Term Goal 2 (Week 2): pt will transfer bed<>WC with LRAD and CGA PT Short Term Goal 3 (Week 2): pt will iniate gait training  Skilled Therapeutic Interventions/Progress  Updates:  Ambulation/gait training;Balance/vestibular training;Community reintegration;Pain management;Neuromuscular re-education;Functional mobility training;DME/adaptive equipment instruction;Patient/family education;Skin care/wound mLandscape architectTherapeutic Activities;Therapeutic Exercise;UE/LE Strength taining/ROM;Wheelchair propulsion/positioning   Today's Interventions: Received pt semi-reclined in bed, pt agreeable to therapy, and reported pain 7/10 in R residual limb. RN present to administer medications twice during session and to inspect IV line in RUE. Repositioning, rest breaks, and distraction done to reduce pain levels. Session with emphasis on functional mobility/transfers, generalized strengthening, dynamic standing balance/coordination, and improved activity tolerance. Pt on 2L O2 via [place] with O2 sat 93%. Donned R black shrinker with max A and L compression sock and non-skid sock with total A. Attempted to locate extender for O2 tubing however none found. Pt transferred semi-reclined<>sitting EOB with supervision and  reported needing to "catch his breath" prior to transferring. Pt transferred bed<>recliner stand<>pivot with RW and min A with O2 sat 92%. Pt performed the following exercises sitting in recliner with supervision and verbal cues for technique: -PF with grn TB 3x10 on LLE -R SLR 3x10 -R knee extension 3x10 Concluded session with pt sitting in recliner, needs within reach, and chair pad alarm on. R residual limb elevated on pillow.   Therapy Documentation Precautions:  Precautions Precautions: Fall Precaution Comments: RLE wound vac/ swollen scotum Required Braces or Orthoses: Other Brace Other Brace: R residual limb guard and R shrinker- Pt still not wearing shrinker.  Says it is too painful due to swelling. Encouraged patient to wear shrinker but continues not to. Restrictions Weight Bearing Restrictions: Yes RLE Weight Bearing: Non weight  bearing  Therapy/Group: Individual Therapy AVicente Males  Dante, DPT   01/25/2021, 7:24 AM

## 2021-01-25 NOTE — Progress Notes (Signed)
Hypoglycemic Event  CBG: 57  Treatment: 8 oz juice/soda  Symptoms: None  Follow-up CBG: Time:1704 CBG Result:95  Possible Reasons for Event: Unknown  Comments/MD notified:Dan, PA stated to hold novolog    Fernando Anthony Fernando Anthony

## 2021-01-25 NOTE — Progress Notes (Signed)
Pt resting with O22L/[place] on and intact. Pt states breathing is much better since oxygen applied. Pt not wearing shrinker states "It is way to tight and the one therapy gave me is too big)" Pt educated to the purpose of shrinker. Pt informed to let MD know about shrinker. Pt medicated for pain. No respiratory distress or SOB lungs are clear but diminished at bilateral bases and RUL. No cyanosis noted skin wdl. Continue with urine collection, foley and containers on ice. Pt resting will continue to monitor

## 2021-01-25 NOTE — Progress Notes (Signed)
Physical Therapy Session Note  Patient Details  Name: Fernando Anthony MRN: [id number] Date of Birth: [birth year]  Today's Date: 01/25/2021 PT Individual Time:Session1: [id number]; Arthor [id number] PT Individual Time Calculation (min): 27 min & 43 min  Short Term Goals: Week 2:  PT Short Term Goal 1 (Week 2): pt will transfer sit<>stand with LRAD and CGA PT Short Term Goal 2 (Week 2): pt will transfer bed<>WC with LRAD and CGA PT Short Term Goal 3 (Week 2): pt will iniate gait training  Skilled Therapeutic Interventions/Progress Updates:    Session1:  Patient in supine reports pain better, but breathing worse this am and currently on 2L O2.  Performed in bed therex as noted below.  Patient refused OOB due to up in recliner 2 hours already this am.  Left in supine with bed alarm on and needs/call bell in reach.  Session2:Patient in supine and agreeable to OOB.  Remains on 2L O2 so applied 2L portable O2 and SpO2 throughout session remained at 95% with HR 55.  Total A to apply limb guard in supine. Patient up to EOB with S HOB up and using rails.  Performed scooting transfer sans board with close S keeping chair braced for safety.  Patient in w/c with max A for applying armrest, R leg residual limb rest and legrest on L.  Propelled w/c over 400' with several stops to rest going down elevator and out to courtyard around fountain.  Noted vitals stable throughout.  Pushed pt back to room in w/c and educated pt on removing armrest and legrest with demonstration.  Scooting transfer again sans board with CGA cues to lift hips to clear EOB.  Patient sit to supine with S, removed limb guard total A and left with wife in the room and needs in reach. Therapy Documentation Precautions:  Precautions Precautions: Fall Precaution Comments: RLE wound vac/ swollen scotum Required Braces or Orthoses: Other Brace Other Brace: R residual limb guard and R shrinker- Pt still not wearing shrinker.  Says it is too  painful due to swelling. Encouraged patient to wear shrinker but continues not to. Restrictions Weight Bearing Restrictions: Yes RLE Weight Bearing: Non weight bearing Pain: Pain Assessment Pain Scale: 0-10 Pain Score: 3  Faces Pain Scale: Hurts little more Pain Type: Surgical pain Pain Location: Leg Pain Orientation: Right Pain Descriptors / Indicators: Aching Pain Frequency: Constant Pain Onset: On-going Patients Stated Pain Goal: 2 Pain Intervention(s): Repositioned;Distraction Exercises: Shoulder Exercises Shoulder Flexion: Strengthening;10 reps;Supine;Both;Theraband Theraband Level (Shoulder Flexion): Level 3 (Green) Shoulder ABduction: Strengthening;Both;10 reps;Supine;Theraband (horizontal abduction) Shoulder External Rotation: Strengthening;Both;10 reps;Supine;Theraband (green t-band) Theraband Level (Shoulder External Rotation): Level 3 (Green) Elbow Flexion: Strengthening;Left;10 reps;Supine;Theraband Theraband Level (Elbow Flexion): Level 3 (Green) Total Joint Exercises Bridges: Strengthening;Left;10 reps;Supine Amputee Exercises Quad Sets: Strengthening;Right;10 reps;Supine Gluteal Sets: Strengthening;Both;10 reps;Supine (5 sec hold) Towel Squeeze: Strengthening;Both;10 reps;Supine (5 sec hold) Hip ABduction/ADduction: AROM;10 reps;Supine;Right Straight Leg Raises: Strengthening;10 reps;Supine;Right   Therapy/Group: Individual Therapy  Reginia Naas  [place], PT 01/25/2021, 12:03 PM

## 2021-01-25 NOTE — Progress Notes (Signed)
Progress Note  Patient Name: Fernando Anthony Date of Encounter: 01/25/2021  [place] Cardiologist: No primary care provider on file.   Subjective   Feels well.  Improved with supplemental oxygen.  Denies lightheadedness or dizziness.  Able to cooperate with PT/OT without difficulty.  Inpatient Medications    Scheduled Meds: . acetaminophen  650 mg Oral Q8H  . amLODipine  10 mg Oral Daily  . aspirin EC  81 mg Oral QHS  . Chlorhexidine Gluconate Cloth  6 each Topical BID  . cloNIDine  0.2 mg Oral BID  . docusate sodium  100 mg Oral BID  . hydrALAZINE  50 mg Oral BID  . insulin aspart  0-15 Units Subcutaneous TID WC  . insulin aspart  2 Units Subcutaneous TID WC  . insulin glargine  20 Units Subcutaneous Daily  . iron polysaccharides  150 mg Oral Daily  . omega-3 acid ethyl esters  1 g Oral Daily  . pregabalin  50 mg Oral TID  . sevelamer carbonate  800 mg Oral TID WC  . tamsulosin  0.4 mg Oral QPC supper  . traZODone  25-50 mg Oral QHS   Continuous Infusions: . methocarbamol (ROBAXIN) IV     PRN Meds: bisacodyl, hydrocortisone cream, methocarbamol **OR** methocarbamol (ROBAXIN) IV, ondansetron **OR** ondansetron (ZOFRAN) IV, oxyCODONE, oxyCODONE, polyethylene glycol, senna-docusate, witch hazel-glycerin   Vital Signs    Vitals:   01/24/21 1715 01/24/21 2052 01/25/21 0455 01/25/21 0842  BP: (!) 146/72 (!) 152/74 (!) 149/72 (!) 157/81  Pulse: (!) 47 (!) 49 (!) 46 (!) 49  Resp: '17 16 18 16  '$ Temp: 98.7 F (37.1 C) 98.7 F (37.1 C) 98.6 F (37 C) 98.4 F (36.9 C)  TempSrc: Oral Oral Oral Oral  SpO2: 96% 99% 95% 97%  Weight:      Height:        Intake/Output Summary (Last 24 hours) at 01/25/2021 0858 Last data filed at 01/25/2021 0713 Gross per 24 hour  Intake 840 ml  Output 2750 ml  Net -1910 ml   Last 3 Weights 01/18/2021 01/16/2021 01/11/2021  Weight (lbs) (No Data) 264 lb 12.4 oz 224 lb 10.4 oz  Weight (kg) (No Data) 120.1 kg 101.9 kg      Telemetry     n/a- Personally Reviewed  ECG    01/24/2021: Sinus bradycardia.  Rate 51 bpm.  PACs.  Right axis deviation. - Personally Reviewed  Physical Exam   VS:  BP (!) 157/81 (BP Location: Left Arm)   Pulse (!) 49   Temp 98.4 F (36.9 C) (Oral)   Resp 16   Ht '6\' 2"'$  (1.88 m)   Wt 120.1 kg   SpO2 97%   BMI 33.99 kg/m  , BMI Body mass index is 33.99 kg/m. GENERAL:  Well appearing HEENT: Pupils equal round and reactive, fundi not visualized, oral mucosa unremarkable NECK:  No jugular venous distention, waveform within normal limits, carotid upstroke brisk and symmetric, no bruits LUNGS: Diminished at the bases.  No crackles, wheezes, or rhonchi. HEART: Bradycardic.  Regular rhythm.  PMI not displaced or sustained,S1 and S2 within normal limits, no S3, no S4, no clicks, no rubs, no murmurs ABD:  Flat, positive bowel sounds normal in frequency in pitch, no bruits, no rebound, no guarding, no midline pulsatile mass, no hepatomegaly, no splenomegaly EXT:  R BKA SKIN:  No rashes no nodules.  + Jaundice NEURO:  Cranial nerves II through XII grossly intact, motor grossly intact throughout PSYCH:  Cognitively intact, oriented to person place and time   Labs    High Sensitivity Troponin:  No results for input(s): TROPONINIHS in the last 720 hours.    Chemistry Recent Labs  Lab 01/23/21 0216 01/24/21 0235 01/25/21 0424  NA 135 136 135  K 4.9 5.3* 5.1  CL 111 109 111  CO2 14* 16* 15*  GLUCOSE 88 148* 93  BUN 79* 78* 79*  CREATININE 5.63* 5.36* 5.30*  CALCIUM 8.1* 8.2* 8.3*  PROT 6.0* 6.0* 5.9*  ALBUMIN 1.7* 1.8* 1.9*  AST 73* 60* 54*  ALT 112* 106* 101*  ALKPHOS 605* 579* 539*  BILITOT 4.0* 3.7* 3.1*  GFRNONAA 11* 11* 11*  ANIONGAP '10 11 9     '$ Hematology Recent Labs  Lab 01/19/21 0426 01/21/21 0253 01/25/21 0424  WBC 9.2  --  6.1  RBC 2.69*  --  2.43*  HGB 8.3* 8.3* 7.7*  HCT 24.4* 23.7* 22.8*  MCV 90.7  --  93.8  MCH 30.9  --  31.7  MCHC 34.0  --  33.8  RDW 19.1*  --   21.9*  PLT 232  --  182    BNPNo results for input(s): BNP, PROBNP in the last 168 hours.   DDimer No results for input(s): DDIMER in the last 168 hours.   Radiology    DG Chest Port 1 View  Result Date: 01/24/2021 CLINICAL DATA:  Shortness of breath EXAM: PORTABLE CHEST 1 VIEW COMPARISON:  January 10, 2021 FINDINGS: The cardiomediastinal silhouette is unchanged and mild enlarged in contour. Likely small RIGHT pleural effusion. No pneumothorax. Perihilar vascular prominence. Patchy bibasilar heterogeneous airspace opacities. Diffuse interstitial prominence. Visualized abdomen is unremarkable. Multilevel degenerative changes of the thoracic spine. IMPRESSION: Constellation of findings are favored to reflect pulmonary edema with scattered atelectasis. Infection could present similarly. Electronically Signed   By: Valentino Saxon MD   On: 01/24/2021 14:52    Cardiac Studies   Echo 01/10/21: 1. Left ventricular ejection fraction, by estimation, is 55 to 60%. The  left ventricle has normal function. The left ventricle has no regional  wall motion abnormalities. There is mild left ventricular hypertrophy.  Left ventricular diastolic parameters  are indeterminate.  2. Right ventricular systolic function is normal. The right ventricular  size is normal. There is moderately elevated pulmonary artery systolic  pressure. The estimated right ventricular systolic pressure is [id number] mmHg.  3. Left atrial size was mildly dilated.  4. Right atrial size was mildly dilated.  5. The mitral valve is normal in structure. No evidence of mitral valve  regurgitation.  6. The aortic valve was not well visualized. Aortic valve regurgitation  is not visualized. No aortic stenosis is present.  7. The inferior vena cava is dilated in size with <50% respiratory  variability, suggesting right atrial pressure of 15 mmHg.   Patient Profile     65 y.o. 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  Cardiology consulted for bradycardia.  Assessment & Plan    # Bradycardia: Home atenolol is on hold.  His last dose was on 3/5.  Given his poor renal function it may take longer for this to wash out of his system.  Unable to get telemetry in inpatient rehab.  Continue to monitor for now.  No indication for pacemaker at this time.  # Shortness of breath: Previously only with exertion.  More recently also reports orthopnea.  Nephrology has been following and  is diuresing aggressively.  This has been complicated by acute on chronic renal failure.  Will defer volume management to nephrology.  He was net -2.8L yesterday.  Diminished breath sounds at bases.  Likely due to pleural effusions.  We will also order incentive parameter.  # Hypertension: Blood pressure poorly controlled.  Continue amlodipine, clonidine, and hydralazine.  Would increase hydralazine to 100 mg if blood pressure remains poorly controlled once more euvolemic.  #Hyperlipidemia: No statin in the setting of elevated LFTs/bili.     For questions or updates, please contact [place] Please consult [email or web address] for contact info under        Signed, Skeet Latch, MD  01/25/2021, 8:58 AM

## 2021-01-25 NOTE — Progress Notes (Signed)
PROGRESS NOTE   Subjective/Complaints: Weekend notes reviewed- patient had shortness of breath, feeling better this morning. Appreciate cardiology consult.  Appreciate Dr. Jess Barters discussion of IV Ketamine and Nitroglycerin patches for pain control- will discuss further with patient.   ROS:  Pt denies SOB when laying back but has it when he sits up, abd pain, CP, N/V/D   Objective:   DG Chest Port 1 View  Result Date: 01/24/2021 CLINICAL DATA:  Shortness of breath EXAM: PORTABLE CHEST 1 VIEW COMPARISON:  January 10, 2021 FINDINGS: The cardiomediastinal silhouette is unchanged and mild enlarged in contour. Likely small RIGHT pleural effusion. No pneumothorax. Perihilar vascular prominence. Patchy bibasilar heterogeneous airspace opacities. Diffuse interstitial prominence. Visualized abdomen is unremarkable. Multilevel degenerative changes of the thoracic spine. IMPRESSION: Constellation of findings are favored to reflect pulmonary edema with scattered atelectasis. Infection could present similarly. Electronically Signed   By: Valentino Saxon MD   On: 01/24/2021 14:52   Recent Labs    01/25/21 0424  WBC 6.1  HGB 7.7*  HCT 22.8*  PLT 182   Recent Labs    01/24/21 0235 01/25/21 0424  NA 136 135  K 5.3* 5.1  CL 109 111  CO2 16* 15*  GLUCOSE 148* 93  BUN 78* 79*  CREATININE 5.36* 5.30*  CALCIUM 8.2* 8.3*    Intake/Output Summary (Last 24 hours) at 01/25/2021 0635 Last data filed at 01/25/2021 0524 Gross per 24 hour  Intake 720 ml  Output 3550 ml  Net -2830 ml        Physical Exam: Vital Signs Blood pressure (!) 149/72, pulse (!) 46, temperature 98.6 F (37 C), temperature source Oral, resp. rate 18, height '6\' 2"'$  (1.88 m), weight 120.1 kg, SpO2 95 %. Gen: no distress, normal appearing HEENT: oral mucosa pink and moist, NCAT Cardio: Bradycardia Chest: normal effort, normal rate of breathing Abd: soft,  non-distended Ext: no edema Psych: pleasant, normal affect Skin: intact Musculoskeletal:     Cervical back: Normal range of motion. No rigidity.     Comments: UEs 5/5 in biceps, triceps, WE, grip and finger abd B/L LLE- HF 4/5, KE, 4/5, DF and PF 5-/5 RLE- HF 3-/5, KE 3-/5 R BKA-healing well with staples intact no evidence of erythema or drainage  Assessment/Plan: 1. Functional deficits which require 3+ hours per day of interdisciplinary therapy in a comprehensive inpatient rehab setting.  Physiatrist is providing close team supervision and 24 hour management of active medical problems listed below.  Physiatrist and rehab team continue to assess barriers to discharge/monitor patient progress toward functional and medical goals  Care Tool:  Bathing    Body parts bathed by patient: Right arm,Left arm,Chest,Abdomen,Right upper leg,Left upper leg,Face   Body parts bathed by helper: Left lower leg,Buttocks,Front perineal area Body parts n/a: Right lower leg   Bathing assist Assist Level: Maximal Assistance - Patient 24 - 49%     Upper Body Dressing/Undressing Upper body dressing   What is the patient wearing?: Pull over shirt    Upper body assist Assist Level: Moderate Assistance - Patient 50 - 74%    Lower Body Dressing/Undressing Lower body dressing      What is the  patient wearing?: Incontinence brief,Pants     Lower body assist Assist for lower body dressing: Maximal Assistance - Patient 25 - 49%     Toileting Toileting Toileting Activity did not occur (Clothing management and hygiene only): N/A (no void or bm)  Toileting assist Assist for toileting:  (not assessed at eval)     Transfers Chair/bed transfer  Transfers assist     Chair/bed transfer assist level: Minimal Assistance - Patient > 75%     Locomotion Ambulation   Ambulation assist   Ambulation activity did not occur: Safety/medical concerns          Walk 10 feet activity   Assist   Walk 10 feet activity did not occur: Safety/medical concerns        Walk 50 feet activity   Assist Walk 50 feet with 2 turns activity did not occur: Safety/medical concerns         Walk 150 feet activity   Assist Walk 150 feet activity did not occur: Safety/medical concerns         Walk 10 feet on uneven surface  activity   Assist Walk 10 feet on uneven surfaces activity did not occur: Safety/medical concerns         Wheelchair     Assist Will patient use wheelchair at discharge?: Yes Type of Wheelchair: Manual    Wheelchair assist level: Supervision/Verbal cueing Max wheelchair distance: 134f    Wheelchair 50 feet with 2 turns activity    Assist        Assist Level: Supervision/Verbal cueing   Wheelchair 150 feet activity     Assist      Assist Level: Supervision/Verbal cueing   Blood pressure (!) 149/72, pulse (!) 46, temperature 98.6 F (37 C), temperature source Oral, resp. rate 18, height '6\' 2"'$  (1.88 m), weight 120.1 kg, SpO2 95 %.  Medical Problem List and Plan: 1.  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  Wound VAC as directed             -patient may  Shower once VAC off- and R BKA covered             -ELOS/Goals: 14-18 days- mod I to supervision  -Continue CIR 2.  Antithrombotics: -DVT/anticoagulation: SCDs left lower extremity             -antiplatelet therapy: Continue Aspirin 81 mg daily 3. Pain Management: Lyrica 50 mg 3 times daily, Robaxin and oxycodone as needed- add Duloxetine 20 mg QHS for nerve pain  2/27- no side effects so far- con't regimen  2/28: waking at night for pain medication. Add Oxycontin '10mg'$  BID, discussed how this works and using less PRN medication.   3/1: shrinker is painful for patient- discussed with Sarah OT applying ace wrap for now and I have ordered a new shrinker.   3/2: Stop log-acting oxycodone given that  it did not help significantly and because patient has history of opioid addiction  3/3: Please try TENS and mirror therapy- will message therapy. Will discuss with Dr. DJess Bartersteam nerve block and nitroglycerin patch as options.   3/4: will discuss plan for opioid taper with patient: 3/4: oxycodone '10mg'$  4H prn, 3/7: oxycodone '5mg'$  q4H prn, 3/9: oxycodone '5mg'$  q6H prn, 3/12: oxycodone '5mg'$  q8H prn, 3/15: oxycodone '5mg'$  q12H prn, 3/16: oxycodone '5mg'$  daily prn, 3/17: discharge- no opioids upon discharge.  3/7: discussed alternative pain options with Dr. DSharol Given such as IV Ketamine  and Nitroglycerin patches. Consulted pharmacy and they did not recommend IV Ketamine given patient's Cr. Will discuss Nitrolgycerin patch today.  4. Mood: Provide emotional support             -antipsychotic agents: N/A 5. Neuropsych: This patient is capable of making decisions on his own behalf. 6. Skin/Wound Care: Routine skin checks. May d/c wound vac 2/28 7. Fluids/Electrolytes/Nutrition: Routine in and outs with follow-up chemistries 8.  Acute on chronic anemia.  Continue Niferex.    3/3: Hgb reviewed, 8.3, repeat Monday  9.  AKI/ATN.  Follow-up nephrology services.  No current plan for hemodialysis- Current Cr 5.76  3/3: discussed etiologies of his ATN: Cr up to 8.5, appreciate nephrology following, note reviewed. Repeat CMP tomorrow.  10.  Hypertension.  Hydralazine 50 mg twice daily, Norvasc 10 mg daily, clonidine 0.1 mg twice daily, Tenormin 50 mg daily.  Monitor with increased mobility  3/4: systolic BP elevated on last 5 reads: increase clonidine to .'2mg'$  (can also help with pain) 11.  Diabetes mellitus with peripheral neuropathy.  Hemoglobin A1c 10.5.  NovoLog 4 units 3 times daily, Lantus insulin 35 units nightly.  Check blood sugars before meals and at bedtime  2/27- BGs 127-160- con't regimen for now-  12.  Hyperlipidemia.  Lovaza 13.  Urinary retention with scrotal edema.  Flomax 0.4 mg daily.  Continue Foley tube  for now until scrotal edema improves and then plan voiding trial.  2/27- elevating on hand towel- is working- also , Renal giving 80 mg IV Lasix x1 today- should also help LE edema/and scrotal edema.  14.  History of tobacco abuse.  Counseling 15. Protein malnutrition- severe- Alb 1.5- cause of edema likely 16. LE edema- severe- don't see Diuretic- likely due to Renal issues- elevating scrotum- con't regimen  2/27- see #13  3/4: improving 17. Insomnia- try Trazodone 25-50 mg QHS for sleep   2/27- working- con't regimen 18. Transaminitis: stable, continue tylenol '650mg'$  TID with daily LFT check.  3/7: LFTs reviewed and are improving 19. Bradycardia:now symptomatic , reduce atenolol, per nephro will get cardiology to eval as well  continue to monitor TID  20.  Shortness of breath worsened with sitting forward.  He does have anasarca and certainly would expect some pleural effusions.  Nephrology is aggressively diuresing.  In addition he does have bradycardia.  As per nephrology recommendations will ask for cardiology assistance. We will check EKG, chest x-ray, reviewed labs from this morning no concerns.  3/7: labs improving! CXR shows pulmonary edema. Diuresis per nephrology.       LOS: 9 days A FACE TO FACE EVALUATION WAS PERFORMED  Izora Ribas 01/25/2021, 6:35 AM

## 2021-01-26 DIAGNOSIS — Z89511 Acquired absence of right leg below knee: Secondary | ICD-10-CM | POA: Diagnosis not present

## 2021-01-26 DIAGNOSIS — F4320 Adjustment disorder, unspecified: Secondary | ICD-10-CM

## 2021-01-26 LAB — COMPREHENSIVE METABOLIC PANEL
ALT: 105 U/L — ABNORMAL HIGH (ref 0–44)
AST: 49 U/L — ABNORMAL HIGH (ref 15–41)
Albumin: 2.3 g/dL — ABNORMAL LOW (ref 3.5–5.0)
Alkaline Phosphatase: 548 U/L — ABNORMAL HIGH (ref 38–126)
Anion gap: 9 (ref 5–15)
BUN: 75 mg/dL — ABNORMAL HIGH (ref 8–23)
CO2: 14 mmol/L — ABNORMAL LOW (ref 22–32)
Calcium: 8.5 mg/dL — ABNORMAL LOW (ref 8.9–10.3)
Chloride: 112 mmol/L — ABNORMAL HIGH (ref 98–111)
Creatinine, Ser: 5.02 mg/dL — ABNORMAL HIGH (ref 0.61–1.24)
GFR, Estimated: 12 mL/min — ABNORMAL LOW (ref >60)
Glucose, Bld: 130 mg/dL — ABNORMAL HIGH (ref 70–99)
Potassium: 4.9 mmol/L (ref 3.5–5.1)
Sodium: 135 mmol/L (ref 135–145)
Total Bilirubin: 3.4 mg/dL — ABNORMAL HIGH (ref 0.3–1.2)
Total Protein: 6.9 g/dL (ref 6.5–8.1)

## 2021-01-26 LAB — GLUCOSE, CAPILLARY
Glucose-Capillary: 121 mg/dL — ABNORMAL HIGH (ref 70–99)
Glucose-Capillary: 167 mg/dL — ABNORMAL HIGH (ref 70–99)
Glucose-Capillary: 95 mg/dL (ref 70–99)
Glucose-Capillary: 203 mg/dL — ABNORMAL HIGH (ref 70–99)

## 2021-01-26 LAB — IMMUNOFIXATION, URINE

## 2021-01-26 MED ORDER — SODIUM BICARBONATE 650 MG PO TABS
1300.0000 mg | ORAL_TABLET | Freq: Three times a day (TID) | ORAL | Status: DC
  Administered 2021-01-26 – 2021-01-28 (×6): 1300 mg via ORAL
  Filled 2021-01-26 (×6): qty 2

## 2021-01-26 MED ORDER — OXYCODONE HCL 5 MG PO TABS
5.0000 mg | ORAL_TABLET | ORAL | Status: DC | PRN
  Administered 2021-01-26 – 2021-01-27 (×3): 5 mg via ORAL
  Filled 2021-01-26 (×3): qty 1

## 2021-01-26 MED ORDER — HYDRALAZINE HCL 50 MG PO TABS
50.0000 mg | ORAL_TABLET | Freq: Once | ORAL | Status: AC
  Administered 2021-01-26: 50 mg via ORAL
  Filled 2021-01-26: qty 1

## 2021-01-26 MED ORDER — HYDRALAZINE HCL 50 MG PO TABS
100.0000 mg | ORAL_TABLET | Freq: Two times a day (BID) | ORAL | Status: DC
  Administered 2021-01-26 – 2021-01-28 (×4): 100 mg via ORAL
  Filled 2021-01-26 (×4): qty 2

## 2021-01-26 MED ORDER — INSULIN ASPART 100 UNIT/ML ~~LOC~~ SOLN
1.0000 [IU] | Freq: Three times a day (TID) | SUBCUTANEOUS | Status: DC
  Administered 2021-01-26 – 2021-01-28 (×7): 1 [IU] via SUBCUTANEOUS

## 2021-01-26 NOTE — Progress Notes (Signed)
Physical Therapy Session Note  Patient Details  Name: Fernando Anthony MRN: [id number] Date of Birth: 02-26-1956  Today's Date: 01/26/2021 PT Individual Time: 7654-6503 and 1100-1117  PT Individual Time Calculation (min): 40 min and 17 min  PT Missed Time: 43 min PT Missed Time Reason: Fatigue   Short Term Goals: Week 1:  PT Short Term Goal 1 (Week 1): Pt will perform STS with min A and LRAD. PT Short Term Goal 1 - Progress (Week 1): Met PT Short Term Goal 2 (Week 1): Pt will propel wc  x 150 ft with supervision PT Short Term Goal 2 - Progress (Week 1): Met PT Short Term Goal 3 (Week 1): Pt will be independent with bed mobility. PT Short Term Goal 3 - Progress (Week 1): Progressing toward goal PT Short Term Goal 4 (Week 1): Pt will iniate gait training with LRAD. PT Short Term Goal 4 - Progress (Week 1): Progressing toward goal Week 2:  PT Short Term Goal 1 (Week 2): pt will transfer sit<>stand with LRAD and CGA PT Short Term Goal 2 (Week 2): pt will transfer bed<>WC with LRAD and CGA PT Short Term Goal 3 (Week 2): pt will iniate gait training  Skilled Therapeutic Interventions/Progress Updates:   Treatment Session 1: 5465-6812 40 min Received pt semi-reclined in bed making phone calls without supplemental O2 on; O2 sat 94%, pt agreeable to therapy, and reported pain 5/10 in R residual limb (premedicated). Repositioning, rest breaks, and distraction done to reduce pain levels. Session with emphasis on functional mobility/transfers, generalized strengthening, standing balance, and improved endurance with activity. Donned limb guard with supervision although removed when standing as limb guard continued to fall off. Pt transferred supine<>sitting EOB with HOB elevated and supervision. Pt transferred sit<>stand with RW and min A/CGA x 4 trials and worked on dynamic standing balance tossing horseshoes x 3 trials using LUE and x1 trial using RUE with CGA/min A for balance. Worked on "hopping" in  place using RW in preparation for gait training 2x8 reps with CGA/min A for balance. Pt able to lift L heel but unable to clear entire forefoot. Pt reported mild SOB requiring seated rest breaks in between "hopping" bouts. Pt performed the following exercises sitting EOB with supervision and verbal cues for technique: -LAQ 2x15 bilaterally with 3lb ankle weight on LLE  -hip flexion 1x15 and 1x8 to fatigue with 3lb ankle weight on LLE and 2x15 on RLE -hip adduction pillow squeezes 2x10 with 5 second isometric hold Pt reported urge to urinate and transferred bed<>WC via lateral scoot and WC<>toilet via lateral scoot with CGA. Pt requested privacy to urinate but agreeable to pull alarm cord when finished. Concluded session with pt sitting on toilet safely with all needs within reach. NT aware of pt's current status.   Treatment Session 2: 1100-1117 17 min Received pt semi-reclined in bed, pt reports feeling "worn out" from previous therapies and declined getting OOB. Pt reported 5/10 pain in R residual limb and 8/10 fatigue. Took measurements to order pt a 20x18 manual WC with R amputee support pad and pt/pt's wife with questions regarding equipment ordering process; referred to [place]. Notified MD of need for additional shrinkers upon D/C as pt reports he would now like to leave no later than this Thursday. Discussed D/C planning and pt reported feeling comfortable performing all transfers with wife upon D/C. Discussed technique for car transfer and encouraged pt to practice, however pt politely refused due to fatigue. Pt's wife with questions regarding  WC fitting into bathroom. Encouraged bringing bedside commode out for ease with transfers. RN present to assess blood glucose levels and PT suggested bed level exercises. Pt continued to refuse stating "I don't want to do anything right now." Concluded session with pt semi-reclined in bed, needs within reach, and bed alarm on. 43 minutes missed of skilled physical  therapy due to fatigue.   Therapy Documentation Precautions:  Precautions Precautions: Fall Precaution Comments: RLE wound vac/ swollen scotum Required Braces or Orthoses: Other Brace Other Brace: R residual limb guard and R shrinker- Pt still not wearing shrinker.  Says it is too painful due to swelling. Encouraged patient to wear shrinker but continues not to. Restrictions Weight Bearing Restrictions: Yes RLE Weight Bearing: Non weight bearing  Therapy/Group: Individual Therapy Alfonse Alpers PT, DPT   01/26/2021, 7:27 AM

## 2021-01-26 NOTE — Progress Notes (Signed)
Pt blood sugar 203. Pt did not receive coverage for dinner. No bedtime insulin coverage. Pt denies hyperglycemia symptoms. RN will continue to monitor.

## 2021-01-26 NOTE — Progress Notes (Signed)
RN gave renvela with food this morning. Pt denied nausea. BP elevated , gave bp med. Educated pt on reducing salt intake. RN will continue to monitor.

## 2021-01-26 NOTE — Discharge Instructions (Signed)
Inpatient Rehab Discharge Instructions  Fernando Anthony Discharge date and time: No discharge date for patient encounter.   Activities/Precautions/ Functional Status: Activity: As tolerated Diet: Carb modified 1500 mL fluid restriction Wound Care: Antibacterial soap/Dial and water daily to cleanse site Functional status:  ___ No restrictions     ___ Walk up steps independently ___ 24/7 supervision/assistance   ___ Walk up steps with assistance ___ Intermittent supervision/assistance  ___ Bathe/dress independently ___ Walk with walker     _x__ Bathe/dress with assistance ___ Walk Independently    ___ Shower independently ___ Walk with assistance    ___ Shower with assistance ___ No alcohol     ___ Return to work/school ________  Special Instructions: No driving smoking or alcohol     COMMUNITY REFERRALS UPON DISCHARGE:    Home Health:   [facility]  Phone:[phone]  Medical Equipment/Items Ordered:WHEELCHAIR                                                 Agency/Supplier:[facility]  [phone]   My questions have been answered and I understand these instructions. I will adhere to these goals and the provided educational materials after my discharge from the hospital.  Patient/Caregiver Signature _______________________________ Date __________  Clinician Signature _______________________________________ Date __________  Please bring this form and your medication list with you to all your follow-up doctor's appointments.

## 2021-01-26 NOTE — Progress Notes (Addendum)
Patient ID: IRVING POTEAT, male   DOB: [birth year], 65 y.o.   MRN: [id number]  Spoke with wife via telephone to discuss team conference goals supervision-mod/i and discharge 3/17. She reports her husband wants to go home by Thursday 3/10 at the latest. He is tired of being in the hospital. Discussed he will require more assist if leaves early. Wife reports she still needs to work even if home while working. That is a conversation she and pt need to have. Wife wanted to talk with MD and have text MD regarding this. She will call wife to address her questions and concerns. She is agreeable to ordering wheelchair and will get tub bench on her own. Will work on setting up [place].   3:00 pm Wife to come in Thursday to go over education and pt's care in anticipation of discharge. Pt wants go home Thursday will see how education goes Thursday.

## 2021-01-26 NOTE — Consult Note (Signed)
Neuropsychological Consultation   Patient:   Fernando Anthony   DOB:   August 31, 1956  MR Number:  [id number]  Location:  [facility] [id number] [place] [address] Dept: [place]: [phone]           Date of Service:   01/26/2021  Start Time:   3 PM End Time:   4 PM  Provider/Observer:  Ilean Skill, Psy.D.       Clinical Neuropsychologist       Billing Code/Service: [phone]  Chief Complaint:    Fernando Anthony is a 65 year old male with history of diabetes, hypertension, hyperlipidemia, tobacco abuse, peripheral vascular disease with partial amputation first ray right foot on 12/05/2018 as well as right great toe amputation in 2014.  Patient was independent with assistive device prior to admission.  Patient presented on 01/04/2021 with right plantar ulceration as well as bouts of malaise and nausea with scrotal swelling.  Patient had initially been seen podiatry for his right plantar ulceration.  MRI of right foot showed open wound involving plantar aspect of the heel with gas noted in soft tissues.  There was indications of tissue necrosis.  There were early signs of osteomyelitis.  Orthopedic consultation felt that the limb was not salvageable and patient underwent transtibial amputation on 01/11/2021.  Patient also had nephrology consult for AKI suspected ATN and renal ultrasound showed no hydronephrosis.  Patient has had extended hospital stay with frustration and coping issues but has been improving recently and plan for discharge may be moved up.  Reason for Service:  Patient was referred for neuropsychological consult due to coping and adjustment issues.  I had seen the patient briefly last week and this was a follow-up for that visit and a full therapeutic intervention.  Below see HPI for the current admission.  HPI: Fernando Anthony. Soun is a 65 year old right-handed male with history of diabetes  mellitus, hypertension, hyperlipidemia, tobacco abuse, peripheral vascular disease with partial amputation first ray right foot 12/05/2018 as well as right great toe amputation at [facility] 09/18/2013.  Per chart review patient lives with spouse.  1 level home with ramped entrance.  Wife works full-time and is currently taking time off and can work from home temporarily.  Patient independent with assistive device prior to admission modified independent ambulating with rolling walker uses a scooter when out in the community.  Presented 01/04/2021 with right plantar ulceration as well as bouts of malaise and nausea with scrotal swelling.  He had initially been seeing podiatry for his right plantar ulceration.  MRI of right foot showed open wound involving plantar aspect of the heel with gas noted in the soft tissues.  Nonenhancing soft tissue suggestive of tissue necrosis.  No discrete rim-enhancing drainable soft tissue abscess.  Abnormal signal intensity and enhancement along the plantar cortex of the calcaneus consistent with early osteomyelitis.  Admission chemistry sodium 129 potassium 3.4 glucose 239 BUN 56 creatinine 3.94 from baseline 11/29/2019 of 1.01, WBC 25,300, hemoglobin 9.9, BNP 449, sedimentation rate 129, lactic acid 3.0, blood cultures positive enterobacterales, hemoglobin A1c 10.5.  Infectious disease currently consulted for positive blood cultures placed on broad-spectrum antibiotics with follow-up orthopedic service Dr. 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  All current antibiotics have been discontinued with latest WBC of 10,300.  Nephrology consulted for AKI suspect ATN and renal ultrasound showed no hydronephrosis.  His lisinopril  and HCTZ were discontinued with latest creatinine 5.86 and no current plan for hemodialysis at this time..  Patient noted significant scrotal edema some urinary retention placed on Flomax a Foley  catheter tube had been placed.  Acute on chronic anemia remained stable latest hemoglobin 8.0.  Therapy evaluations completed due to patient decreased functional mobility was admitted for a comprehensive rehab program.  Current Status:  Patient was laying in bed in a slightly inclined position.  He was alert and oriented and remembered me from our previous meeting.  The patient reports that he continues to struggle with strength and endurance but has been making significant improvements and they are looking at moving up his discharge date as he is already been in the hospital for nearly 1 month.  Patient reports that his mood has been improving and he feels like he has made some gains but also feels like he would be able to do better at home once the nighttime checks are not happening.  Patient reports difficulty having good night sleep with part of standard monitoring from nursing.  Patient reports that his mood has been improving and he remains oriented with good cognition.  Behavioral Observation: QUAYSHAUN WIZNER  presents as a 65 y.o.-year-old Right Caucasian Male who appeared his stated age. his dress was Appropriate and he was Well Groomed and his manners were Appropriate to the situation.  his participation was indicative of Appropriate and Attentive behaviors.  There were physical disabilities noted.  he displayed an appropriate level of cooperation and motivation.     Interactions:    Active Appropriate and Attentive  Attention:   within normal limits and attention span and concentration were age appropriate  Memory:   within normal limits; recent and remote memory intact  Visuo-spatial:  not examined  Speech (Volume):  normal  Speech:   normal; normal  Thought Process:  Coherent and Relevant  Though Content:  WNL; not suicidal and not homicidal  Orientation:   person, place, time/date and situation  Judgment:   Fair  Planning:   Fair  Affect:    Flat and  Lethargic  Mood:    Dysphoric  Insight:   Fair  Intelligence:   normal  Medical History:   Past Medical History:  Diagnosis Date  . Back pain   . Diabetes mellitus   . Headache(784.0)    general  . Hyperlipidemia   . Hypertension   . Osteomyelitis ([place]) 11/2018   RIGHT FOOT         Patient Active Problem List   Diagnosis Date Noted  . Right below-knee amputee ([place]) 01/16/2021  . Acute osteomyelitis of right calcaneus ([place])   . Severe protein-calorie malnutrition ([place])   . AKI (acute kidney injury) ([place]) 01/04/2021  . Anemia 01/04/2021  . History of transmetatarsal amputation of right foot ([place]) 06/11/2019  . Neuropathic pain of foot, right 06/11/2019  . Memory difficulties 01/18/2019  . Psychophysiological insomnia 01/18/2019  . Hyperlipidemia associated with type 2 diabetes mellitus ([place]) 12/13/2018  . Hypertension associated with type 2 diabetes mellitus ([place]) 12/13/2018  . Partial nontraumatic amputation of right foot ([place]) 12/13/2018  . Osteomyelitis of right foot ([place]) 12/04/2018  . Hypertensive urgency 12/04/2018  . Abnormal LFTs 12/04/2018  . Hyponatremia 12/04/2018  . Osteomyelitis ([place]) 12/04/2018  . Hypertension, essential, benign 08/20/2017  . Hypokalemia 08/20/2017  . 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 Lumbar post-laminectomy syndrome 09/16/2014  .  Diabetic neuropathy ([place]) 04/04/2013  . Postlaminectomy syndrome, lumbar region 05/18/2012  . Uncontrolled diabetes mellitus with diabetic nephropathy ([place]) 05/18/2012    Psychiatric History:  Patient with no previous psychiatric history.  Family Med/Psych History:  Family History  Problem Relation Age of Onset  . Cancer Mother   . Heart disease Father     Impression/DX:  NESTOR MARANO is a 66 year old male with history of diabetes, hypertension, hyperlipidemia, tobacco abuse, peripheral vascular disease  with partial amputation first ray right foot on 12/05/2018 as well as right great toe amputation in 2014.  Patient was independent with assistive device prior to admission.  Patient presented on 01/04/2021 with right plantar ulceration as well as bouts of malaise and nausea with scrotal swelling.  Patient had initially been seen podiatry for his right plantar ulceration.  MRI of right foot showed open wound involving plantar aspect of the heel with gas noted in soft tissues.  There was indications of tissue necrosis.  There were early signs of osteomyelitis.  Orthopedic consultation felt that the limb was not salvageable and patient underwent transtibial amputation on 01/11/2021.  Patient also had nephrology consult for AKI suspected ATN and renal ultrasound showed no hydronephrosis.  Patient has had extended hospital stay with frustration and coping issues but has been improving recently and plan for discharge may be moved up.  Patient was laying in bed in a slightly inclined position.  He was alert and oriented and remembered me from our previous meeting.  The patient reports that he continues to struggle with strength and endurance but has been making significant improvements and they are looking at moving up his discharge date as he is already been in the hospital for nearly 1 month.  Patient reports that his mood has been improving and he feels like he has made some gains but also feels like he would be able to do better at home once the nighttime checks are not happening.  Patient reports difficulty having good night sleep with part of standard monitoring from nursing.  Patient reports that his mood has been improving and he remains oriented with good cognition.  Disposition/Plan:  Today we worked on coping and adjustment and reviewing issues for approaching his status post discharge.  The patient is looking forward to going home but will continue to need assistance from his wife.  Diagnosis:    Rt  BKA        Electronically Signed   _______________________ Ilean Skill, Psy.D. Clinical Neuropsychologist

## 2021-01-26 NOTE — Patient Care Conference (Signed)
Inpatient RehabilitationTeam Conference and Plan of Care Update Date: 01/26/2021   Time: 9:46 AM    Patient Name: Fernando Anthony      Medical Record Number: [id number]  Date of Birth: [birth year] Sex: Male         Room/Bed: [id number] Payor Info: Payor: [place] / Plan: [employer] COMM PPO / Product Type: *No Product type* /    Admit Date/Time:  01/16/2021  6:13 PM  Primary Diagnosis:  Right below-knee amputee [facility])  Hospital Problems: Principal Problem:   Right below-knee amputee [facility]) Active Problems:   AKI (acute kidney injury) ([place])   Severe protein-calorie malnutrition [facility])    Expected Discharge Date: Expected Discharge Date: 02/04/21  Team Members Present: Physician leading conference: Dr. Leeroy Cha Care Coodinator Present: Dorthula Nettles, RN, BSN, CRRN;Becky Dupree, LCSW Nurse Present: Other (comment) Jarvis Morgan, RN) PT Present: Becky Sax, PT OT Present: [name], OT PPS Coordinator present : Gunnar Fusi, SLP     Current Status/Progress Goal Weekly Team Focus  Bowel/Bladder   Foley discontinued, patient voiding, continent b/b  Pt will remain continent of Bowel.      Swallow/Nutrition/ Hydration             ADL's   Min-mod assist lateral scoot transfers vs stand pivot with RW, wife is completing bathing and dressing with/for pt so no focus during OT, pt very limited due to pain  Supervision bathing and dressing, Min A LB dressing, Mod I toileting - may need to downgrade toileting and toilet transfers to Supervision  ADL retraining as pt willing, sit > stand, transfers, residual limb care, pain management, endurance   Mobility   bed mobility supervision, sit<>stand and stand<>pivot transfers with RW min A, WC mobility >160f supervision. Recently limited in therapy due to pain.  mod I  pain management, functional mobility/transfers, generalized strengthening, dynamic standing balance/coordination, amputee education, endurance    Communication             Safety/Cognition/ Behavioral Observations            Pain   Pt. continues to have 8/10 pain  Pt pain level with be below pain goal of 4.  assess pain q 4 hr and prn   Skin   staples to R BKA incision, Duda compression sock, edema still present  Pt will have decrease edema, incision on R BKA will remain free of infection and not develop new skin breakdown.  assess skin q shift and prn     Discharge Planning:  Wife plans to work from home and be able to assist pt. Pain seems to limit pt the most in his therapies and progress. Will need family education prior to discharge   Team Discussion: Foley discontinued 01/26/21, pain 8/10 and still a barrier, decreased Novalog due to some low blood sugar readings, reduced Oxycodone dosage. New order for Hydralazine. Patient is voiding since foley was discontinued. Plan is to discharge home with wife. Patient on target to meet rehab goals: Patient is getting back on track, min assist to contact guard for bed mobility. May have to down grade some of his PT goals. Wife does his bathing and dressing, and toileting transfers when she is here despite continued education. Not open to OT interventions. Not willing to try anything alternative for pain interventions either.  *See Care Plan and progress notes for long and short-term goals.   Revisions to Treatment Plan:  MD put in order for Hydralazine, decreased Oxycodone.  Teaching  Needs: Family education, medication management, pain management, skin/wound care, transfer training, gait training, balance training, endurance training, weight bearing precautions, diabetes management.  Current Barriers to Discharge: Inaccessible home environment, Decreased caregiver support, Home enviroment access/layout, Wound care, Lack of/limited family support, Weight bearing restrictions, Medication compliance and Behavior  Possible Resolutions to Barriers: Continue current medications, provide  emotional support.     Medical Summary Current Status: Uncontrolled pain, voiding well since foley was d/ced, acute tubular necrosis, shorntess of breath  Barriers to Discharge: Medical stability;Behavior  Barriers to Discharge Comments: Uncontrolled pain, acute tubular necrosis, shortness of breath, scrotal edema Possible Resolutions to [employer] Focus: wean oxycodone given history of opioid abuse, I have researched alternative options, monitor Cr daily, aggresive diuresis, monitor scrotal edema- improving.   Continued Need for Acute Rehabilitation Level of Care: The patient requires daily medical management by a physician with specialized training in physical medicine and rehabilitation for the following reasons: Direction of a multidisciplinary physical rehabilitation program to maximize functional independence : Yes Medical management of patient stability for increased activity during participation in an intensive rehabilitation regime.: Yes Analysis of laboratory values and/or radiology reports with any subsequent need for medication adjustment and/or medical intervention. : Yes   I attest that I was present, lead the team conference, and concur with the assessment and plan of the team.   Cristi Loron 01/26/2021, 1:07 PM

## 2021-01-26 NOTE — Progress Notes (Signed)
Progress Note  Patient Name: Fernando Anthony Date of Encounter: 01/26/2021  [facility] Cardiologist: No primary care provider on file.   Subjective   Feels well.  Improved with supplemental oxygen.  Denies lightheadedness or dizziness.  Able to cooperate with PT/OT without difficulty.  Inpatient Medications    Scheduled Meds: . acetaminophen  650 mg Oral Q8H  . amLODipine  10 mg Oral Daily  . aspirin EC  81 mg Oral QHS  . Chlorhexidine Gluconate Cloth  6 each Topical BID  . cloNIDine  0.2 mg Oral BID  . [START ON 01/27/2021] darbepoetin (ARANESP) injection - NON-DIALYSIS  40 mcg Subcutaneous Q Wed-1800  . docusate sodium  100 mg Oral BID  . hydrALAZINE  50 mg Oral BID  . insulin aspart  0-15 Units Subcutaneous TID WC  . insulin aspart  1 Units Subcutaneous TID WC  . insulin glargine  20 Units Subcutaneous Daily  . omega-3 acid ethyl esters  1 g Oral Daily  . pregabalin  50 mg Oral TID  . sevelamer carbonate  800 mg Oral TID WC  . tamsulosin  0.4 mg Oral QPC supper  . traZODone  25-50 mg Oral QHS   Continuous Infusions: . ferumoxytol 510 mg (01/25/21 1554)  . methocarbamol (ROBAXIN) IV     PRN Meds: bisacodyl, hydrocortisone cream, methocarbamol **OR** methocarbamol (ROBAXIN) IV, ondansetron **OR** ondansetron (ZOFRAN) IV, oxyCODONE, oxyCODONE, polyethylene glycol, senna-docusate, witch hazel-glycerin   Vital Signs    Vitals:   01/25/21 2226 01/26/21 0002 01/26/21 0407 01/26/21 0638  BP: (!) 177/81 (!) 148/68 (!) 176/79 (!) 172/78  Pulse: (!) 53 (!) 51 (!) 55   Resp:   17   Temp:   98.6 F (37 C)   TempSrc:   Oral   SpO2:   91%   Weight:      Height:        Intake/Output Summary (Last 24 hours) at 01/26/2021 0840 Last data filed at 01/26/2021 0716 Gross per 24 hour  Intake 870 ml  Output 1200 ml  Net -330 ml   Last 3 Weights 01/18/2021 01/16/2021 01/11/2021  Weight (lbs) (No Data) 264 lb 12.4 oz 224 lb 10.4 oz  Weight (kg) (No Data) 120.1 kg 101.9 kg       Telemetry    n/a- Personally Reviewed  ECG    01/24/2021: Sinus bradycardia.  Rate 51 bpm.  PACs.  Right axis deviation. - Personally Reviewed  Physical Exam   VS:  BP (!) 172/78   Pulse (!) 55   Temp 98.6 F (37 C) (Oral)   Resp 17   Ht '6\' 2"'$  (1.88 m)   Wt 120.1 kg   SpO2 91%   BMI 33.99 kg/m  , BMI Body mass index is 33.99 kg/m. GENERAL:  Well appearing HEENT: Pupils equal round and reactive, fundi not visualized, oral mucosa unremarkable NECK:  No jugular venous distention, waveform within normal limits, carotid upstroke brisk and symmetric, no bruits LUNGS: Diminished at the bases.  No crackles, wheezes, or rhonchi. HEART: Bradycardic.  Regular rhythm.  PMI not displaced or sustained,S1 and S2 within normal limits, no S3, no S4, no clicks, no rubs, no murmurs ABD:  Flat, positive bowel sounds normal in frequency in pitch, no bruits, no rebound, no guarding, no midline pulsatile mass, no hepatomegaly, no splenomegaly EXT:  R BKA SKIN:  No rashes no nodules.  + Jaundice NEURO:  Cranial nerves II through XII grossly intact, motor grossly intact throughout PSYCH:  Cognitively intact, oriented to person place and time   Labs    High Sensitivity Troponin:  No results for input(s): TROPONINIHS in the last 720 hours.    Chemistry Recent Labs  Lab 01/24/21 0235 01/25/21 0424 01/26/21 0458  NA 136 135 135  K 5.3* 5.1 4.9  CL 109 111 112*  CO2 16* 15* 14*  GLUCOSE 148* 93 130*  BUN 78* 79* 75*  CREATININE 5.36* 5.30* 5.02*  CALCIUM 8.2* 8.3* 8.5*  PROT 6.0* 5.9* 6.9  ALBUMIN 1.8* 1.9* 2.3*  AST 60* 54* 49*  ALT 106* 101* 105*  ALKPHOS 579* 539* 548*  BILITOT 3.7* 3.1* 3.4*  GFRNONAA 11* 11* 12*  ANIONGAP '11 9 9     '$ Hematology Recent Labs  Lab 01/21/21 0253 01/25/21 0424  WBC  --  6.1  RBC  --  2.43*  HGB 8.3* 7.7*  HCT 23.7* 22.8*  MCV  --  93.8  MCH  --  31.7  MCHC  --  33.8  RDW  --  21.9*  PLT  --  182    BNPNo results for input(s): BNP,  PROBNP in the last 168 hours.   DDimer No results for input(s): DDIMER in the last 168 hours.   Radiology    DG Chest Port 1 View  Result Date: 01/24/2021 CLINICAL DATA:  Shortness of breath EXAM: PORTABLE CHEST 1 VIEW COMPARISON:  January 10, 2021 FINDINGS: The cardiomediastinal silhouette is unchanged and mild enlarged in contour. Likely small RIGHT pleural effusion. No pneumothorax. Perihilar vascular prominence. Patchy bibasilar heterogeneous airspace opacities. Diffuse interstitial prominence. Visualized abdomen is unremarkable. Multilevel degenerative changes of the thoracic spine. IMPRESSION: Constellation of findings are favored to reflect pulmonary edema with scattered atelectasis. Infection could present similarly. Electronically Signed   By: Valentino Saxon MD   On: 01/24/2021 14:52    Cardiac Studies   Echo 01/10/21: 1. Left ventricular ejection fraction, by estimation, is 55 to 60%. The  left ventricle has normal function. The left ventricle has no regional  wall motion abnormalities. There is mild left ventricular hypertrophy.  Left ventricular diastolic parameters  are indeterminate.  2. Right ventricular systolic function is normal. The right ventricular  size is normal. There is moderately elevated pulmonary artery systolic  pressure. The estimated right ventricular systolic pressure is [id number] mmHg.  3. Left atrial size was mildly dilated.  4. Right atrial size was mildly dilated.  5. The mitral valve is normal in structure. No evidence of mitral valve  regurgitation.  6. The aortic valve was not well visualized. Aortic valve regurgitation  is not visualized. No aortic stenosis is present.  7. The inferior vena cava is dilated in size with <50% respiratory  variability, suggesting right atrial pressure of 15 mmHg.   Patient Profile     65 y.o. male with diabetes, hypertension, hyperlipidemia, and diabetic foot ulcer with osteomyelitis status post amputation  now in rehab.  Cardiology consulted for bradycardia.  Assessment & Plan    # Bradycardia: Home atenolol is on hold.  His last dose was on 3/5.  Given his poor renal function it may take longer for this to wash out of his system.  Unable to get telemetry in inpatient rehab.  Continue to monitor for now.  No indication for pacemaker at this time.  His heart rate seems to slowly be improving and he denies any lightheadedness.  # Shortness of breath: Previously only with exertion.  More recently also reports orthopnea.  Nephrology has  been following and is diuresing aggressively.  This has been complicated by acute on chronic renal failure.  Will defer volume management to nephrology.  His diuresis seems to be slowing.  He is still slightly volume overloaded.  Renal function is trending in the right direction.   Continue incentive parameter.  # Hypertension: Blood pressure poorly controlled.  Continue amlodipine, clonidine, and hydralazine.  We will increase hydralazine to 100 mg twice daily.  #Hyperlipidemia: No statin in the setting of elevated LFTs/bili.     For questions or updates, please contact [facility] Please consult [email or web address] for contact info under        Signed, Skeet Latch, MD  01/26/2021, 8:40 AM

## 2021-01-26 NOTE — Progress Notes (Signed)
Physical Therapy Session Note  Patient Details  Name: Fernando Anthony MRN: [id number] Date of Birth: 10-Feb-1956  Today's Date: 01/26/2021 PT Individual Time: 1000-1028 PT Individual Time Calculation (min): 28 min   Short Term Goals: Week 1:  PT Short Term Goal 1 (Week 1): Pt will perform STS with min A and LRAD. PT Short Term Goal 1 - Progress (Week 1): Met PT Short Term Goal 2 (Week 1): Pt will propel wc  x 150 ft with supervision PT Short Term Goal 2 - Progress (Week 1): Met PT Short Term Goal 3 (Week 1): Pt will be independent with bed mobility. PT Short Term Goal 3 - Progress (Week 1): Progressing toward goal PT Short Term Goal 4 (Week 1): Pt will iniate gait training with LRAD. PT Short Term Goal 4 - Progress (Week 1): Progressing toward goal Week 2:  PT Short Term Goal 1 (Week 2): pt will transfer sit<>stand with LRAD and CGA PT Short Term Goal 2 (Week 2): pt will transfer bed<>WC with LRAD and CGA PT Short Term Goal 3 (Week 2): pt will iniate gait training Week 3:     Skilled Therapeutic Interventions/Progress Updates:    Pain:  Pt reports no pain but states he is too tired from AM session to get out of bed.  Treatment to tolerance.  Rest breaks and repositioning as needed.  Pt initially supine and agreeable to treatment session but only at bed level.  Requested to remove shrinker due to discomfort from rolling of proximal end.  Removed during session. Pt performed the following therex: Hip abd/add 21x20 SLR 21x20 Single limb partial bridge 1x10  Sidelying: Hip extension x 15 w/3 secx hold at end range Hip abduction x 10 Knee flexion x 20  PT c/o difficulty "catching his breath", returned supine and HOB elevated w/resolution of sx. shrinker donned by therapist and pt repositioned for comfort.  Pt left supine w/rails up x 4, alarm set, bed in lowest position, and needs in reach.  Therapy Documentation Precautions:  Precautions Precautions: Fall Precaution Comments: RLE  wound vac/ swollen scotum Required Braces or Orthoses: Other Brace Other Brace: R residual limb guard and R shrinker- Pt still not wearing shrinker.  Says it is too painful due to swelling. Encouraged patient to wear shrinker but continues not to. Restrictions Weight Bearing Restrictions: Yes RLE Weight Bearing: Non weight bearing    Therapy/Group: Individual Therapy  Callie Fielding, [place] 01/26/2021, 10:32 AM

## 2021-01-26 NOTE — Progress Notes (Signed)
Occupational Therapy Session Note  Patient Details  Name: Fernando Anthony MRN: [id number] Date of Birth: 1956-04-10  Today's Date: 01/26/2021 OT Individual Time: [id number] Individual Time Calculation (min): 11 min  and Today's Date: 01/26/2021 OT Missed Time: 46 Minutes Missed Time Reason: Patient fatigue   Short Term Goals: Week 2:  OT Short Term Goal 1 (Week 2): Pt will perform BSC/toilet transfer with Min A and LRAD OT Short Term Goal 2 (Week 2): Pt will perform LB dress with AE PRN with no more than Mod A OT Short Term Goal 3 (Week 2): Pt will complete toileting with Min A OT Short Term Goal 4 (Week 2): Pt will remain OOB for 2 hours to progress towards completing self- care tasks OOB  Skilled Therapeutic Interventions/Progress Updates:    Limited treatment session secondary to fatigue.  Pt received upright in bed reporting "too fatigued" to engage in any therapy this session.  Pt reports working well with first therapy session, then only able to do bed level during 2nd therapy session, and unable to participate in 3rd.  Pt stating it is too much and he is too tired this afternoon.  Pt also reporting desire to d/c home this week, preferably by Thursday.  Discussed current level and wife currently assisting with bathing and dressing tasks.  Pt does report decrease in pain in residual limb.  Pt requested to rest due to fatigue, therefore missed 49 mins.  Therapy Documentation Precautions:  Precautions Precautions: Fall Precaution Comments: RLE wound vac/ swollen scotum Required Braces or Orthoses: Other Brace Other Brace: R residual limb guard and R shrinker- Pt still not wearing shrinker.  Says it is too painful due to swelling. Encouraged patient to wear shrinker but continues not to. Restrictions Weight Bearing Restrictions: Yes RLE Weight Bearing: Non weight bearing General: General OT Amount of Missed Time: 41 Minutes PT Missed Treatment Reason: Patient fatigue Vital  Signs: Therapy Vitals Temp: 98.4 F (36.9 C) Temp Source: Oral Pulse Rate: (!) 54 Resp: 16 BP: (!) 169/79 Patient Position (if appropriate): Lying Oxygen Therapy SpO2: 93 % Pain: Pain Assessment Pain Scale: 0-10 Pain Score: 4    Therapy/Group: Individual Therapy  [name] 01/26/2021, 3:09 PM

## 2021-01-26 NOTE — Progress Notes (Signed)
Patient ID: Fernando Anthony, male   DOB: 1956/06/28, 65 y.o.   MRN: [id number] S: Urine output continues to be good.  No shortness of breath today.  Creatinine improving O:BP (!) 166/81 (BP Location: Left Arm)   Pulse (!) 59   Temp 98.6 F (37 C) (Oral)   Resp 16   Ht '6\' 2"'$  (1.88 m)   Wt 120.1 kg   SpO2 92%   BMI 33.99 kg/m   Intake/Output Summary (Last 24 hours) at 01/26/2021 1022 Last data filed at 01/26/2021 0716 Gross per 24 hour  Intake 870 ml  Output 1200 ml  Net -330 ml   Intake/Output: I/O last 3 completed shifts: In: 1110 [P.O.:1110] Out: 3300 [Urine:3300]  Intake/Output this shift:  Total I/O In: 120 [P.O.:120] Out: -  Weight change:  Gen: NAD CVS: Bradycardic Resp: Bilateral chest rise, no increased work of breathing Abd: +BS, soft, NT/ND Ext: s/p RBKA, edema present in left lower extremity  Recent Labs  Lab 01/20/21 0430 01/21/21 0253 01/22/21 0608 01/23/21 0216 01/24/21 0235 01/25/21 0424 01/26/21 0458  NA 134* 137 135 135 136 135 135  K 4.5 5.0 4.7 4.9 5.3* 5.1 4.9  CL 107 106 110 111 109 111 112*  CO2 18* 19* 16* 14* 16* 15* 14*  GLUCOSE 135* 112* 107* 88 148* 93 130*  BUN 77* 79* 79* 79* 78* 79* 75*  CREATININE 5.89* 5.79* 5.85* 5.63* 5.36* 5.30* 5.02*  ALBUMIN 1.5* 1.7* 1.7* 1.7* 1.8* 1.9* 2.3*  CALCIUM 7.8* 8.3* 8.1* 8.1* 8.2* 8.3* 8.5*  PHOS  --   --   --   --   --  6.9*  --   AST 59* 56* 77* 73* 60* 54* 49*  ALT 104* 106* 110* 112* 106* 101* 105*   Liver Function Tests: Recent Labs  Lab 01/24/21 0235 01/25/21 0424 01/26/21 0458  AST 60* 54* 49*  ALT 106* 101* 105*  ALKPHOS 579* 539* 548*  BILITOT 3.7* 3.1* 3.4*  PROT 6.0* 5.9* 6.9  ALBUMIN 1.8* 1.9* 2.3*   No results for input(s): LIPASE, AMYLASE in the last 168 hours. No results for input(s): AMMONIA in the last 168 hours. CBC: Recent Labs  Lab 01/21/21 0253 01/25/21 0424  WBC  --  6.1  HGB 8.3* 7.7*  HCT 23.7* 22.8*  MCV  --  93.8  PLT  --  182   Cardiac Enzymes: No  results for input(s): CKTOTAL, CKMB, CKMBINDEX, TROPONINI in the last 168 hours. CBG: Recent Labs  Lab 01/25/21 1618 01/25/21 1643 01/25/21 1704 01/25/21 2053 01/26/21 0605  GLUCAP 57* 60* 95 124* 121*    Iron Studies:  Recent Labs    01/25/21 1139  IRON 49  TIBC 276  FERRITIN 290   Studies/Results: DG Chest Port 1 View  Result Date: 01/24/2021 CLINICAL DATA:  Shortness of breath EXAM: PORTABLE CHEST 1 VIEW COMPARISON:  January 10, 2021 FINDINGS: The cardiomediastinal silhouette is unchanged and mild enlarged in contour. Likely small RIGHT pleural effusion. No pneumothorax. Perihilar vascular prominence. Patchy bibasilar heterogeneous airspace opacities. Diffuse interstitial prominence. Visualized abdomen is unremarkable. Multilevel degenerative changes of the thoracic spine. IMPRESSION: Constellation of findings are favored to reflect pulmonary edema with scattered atelectasis. Infection could present similarly. Electronically Signed   By: Valentino Saxon MD   On: 01/24/2021 14:52   . acetaminophen  650 mg Oral Q8H  . amLODipine  10 mg Oral Daily  . aspirin EC  81 mg Oral QHS  . Chlorhexidine Gluconate Cloth  6 each Topical BID  . cloNIDine  0.2 mg Oral BID  . [START ON 01/27/2021] darbepoetin (ARANESP) injection - NON-DIALYSIS  40 mcg Subcutaneous Q Wed-1800  . docusate sodium  100 mg Oral BID  . hydrALAZINE  100 mg Oral BID  . insulin aspart  0-15 Units Subcutaneous TID WC  . insulin aspart  1 Units Subcutaneous TID WC  . insulin glargine  20 Units Subcutaneous Daily  . omega-3 acid ethyl esters  1 g Oral Daily  . pregabalin  50 mg Oral TID  . sevelamer carbonate  800 mg Oral TID WC  . tamsulosin  0.4 mg Oral QPC supper  . traZODone  25-50 mg Oral QHS    BMET    Component Value Date/Time   NA 135 01/26/2021 0458   K 4.9 01/26/2021 0458   CL 112 (H) 01/26/2021 0458   CO2 14 (L) 01/26/2021 0458   GLUCOSE 130 (H) 01/26/2021 0458   BUN 75 (H) 01/26/2021 0458    CREATININE 5.02 (H) 01/26/2021 0458   CALCIUM 8.5 (L) 01/26/2021 0458   GFRNONAA 12 (L) 01/26/2021 0458   GFRAA >60 11/29/2019 2137   CBC    Component Value Date/Time   WBC 6.1 01/25/2021 0424   RBC 2.43 (L) 01/25/2021 0424   HGB 7.7 (L) 01/25/2021 0424   HCT 22.8 (L) 01/25/2021 0424   PLT 182 01/25/2021 0424   MCV 93.8 01/25/2021 0424   MCH 31.7 01/25/2021 0424   MCHC 33.8 01/25/2021 0424   RDW 21.9 (H) 01/25/2021 0424   LYMPHSABS 1.1 01/18/2021 0209   MONOABS 1.0 01/18/2021 0209   EOSABS 0.2 01/18/2021 0209   BASOSABS 0.1 01/18/2021 0209    Assessment/Plan:  1. AKI/CKD stage IIIa: Creatinine as low as 1.1 in 2021 and was 4 on arrival.  Creatinine peaked at 6 and has been very slow to improve.  Was taking NSAIDs prior to arrival but unclear why creatinine has not improved further.  Serological work-up nonrevealing.  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 Continue to monitor creatinine trend 2. Hold albumin for now 3. No uremic symptoms or need for dialysis at this time 4. Could consider kidney biopsy if the creatinine does not improve with time 2. Bradycardia -cardiology following.  Thought to be related to decreased clearance of atenolol given his significant renal insufficiency.  Continue to monitor.  Asymptomatic today 3. SOB -improved today.  No oxygen 4. 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 Per orthopedics.  5. [id number] held.  Blood pressure elevated at this time currently taking clonidine 0.2 twice daily, hydralazine 50 mg twice daily, amlodipine 10 mg daily.  Hopefully will improve with improving volume status 6. Anemia- Secondary to chronic illness. Tsat low, holding IV Fe in the setting of active infection. AKI contributing as well. aranesp 40 mcg once on 2/23. On oral iron. PRBC's on 2/26 given. Reorder aranesp to be given on Wed  given falling hgb.  Iron stores remain depleted.  Feraheme as ordered for 2 doses 7. hyperphosphatemia - renal diet.  Continue Renvela 8. DM - Insulin per primary team.  9. Urinary retention: new per patient the last couple of weeks.Nowon flomax. Now with foley and continue same. Assess voiding trial soon - has been on flomax 10. Severe protein malnutrition - per primary 11. Disposition - cont with PT/OT 12. Abnormal LFT's -hepatitis panel negative and right upper quadrant  okay.  Continue to monitor

## 2021-01-26 NOTE — Progress Notes (Addendum)
Pt BP elevated [id number] systolic. RN notified Linna Hoff, [place] No new orders. Pt denied symptoms . RN will continue to monitor. Pt received snack for bedtime.RN will continue to monitor. Foley removed on days. RN strict monitoring of I & Os. Q6hr bladder scans. Upon assessment pt on 2 LO2. RN took pt off of O2 for 5 mins. Pt tolerated well. Oxygen level between 95-100%. RN will continue to monitor.

## 2021-01-26 NOTE — Progress Notes (Addendum)
PROGRESS NOTE   Subjective/Complaints: Team conference today Wife provides significant support Trying mirror therapy and alternative pain options.  Weaning oxycodone given history of opioid abuse- discussed with patient.   ROS:  Pt denies SOB when laying back but has it when he sits up, abd pain, CP, N/V/D, + residual limb pain   Objective:   DG Chest Port 1 View  Result Date: 01/24/2021 CLINICAL DATA:  Shortness of breath EXAM: PORTABLE CHEST 1 VIEW COMPARISON:  January 10, 2021 FINDINGS: The cardiomediastinal silhouette is unchanged and mild enlarged in contour. Likely small RIGHT pleural effusion. No pneumothorax. Perihilar vascular prominence. Patchy bibasilar heterogeneous airspace opacities. Diffuse interstitial prominence. Visualized abdomen is unremarkable. Multilevel degenerative changes of the thoracic spine. IMPRESSION: Constellation of findings are favored to reflect pulmonary edema with scattered atelectasis. Infection could present similarly. Electronically Signed   By: Valentino Saxon MD   On: 01/24/2021 14:52   Recent Labs    01/25/21 0424  WBC 6.1  HGB 7.7*  HCT 22.8*  PLT 182   Recent Labs    01/25/21 0424 01/26/21 0458  NA 135 135  K 5.1 4.9  CL 111 112*  CO2 15* 14*  GLUCOSE 93 130*  BUN 79* 75*  CREATININE 5.30* 5.02*  CALCIUM 8.3* 8.5*    Intake/Output Summary (Last 24 hours) at 01/26/2021 0929 Last data filed at 01/26/2021 0716 Gross per 24 hour  Intake 870 ml  Output 1200 ml  Net -330 ml        Physical Exam: Vital Signs Blood pressure (!) 172/78, pulse (!) 55, temperature 98.6 F (37 C), temperature source Oral, resp. rate 17, height '6\' 2"'$  (1.88 m), weight 120.1 kg, SpO2 91 %. Gen: no distress, normal appearing HEENT: oral mucosa pink and moist, NCAT Cardio: Bradycardia Chest: normal effort, normal rate of breathing Abd: soft, non-distended Ext: no edema Psych: pleasant,  normal affect Skin: intact Musculoskeletal:     Cervical back: Normal range of motion. No rigidity.     Comments: UEs 5/5 in biceps, triceps, WE, grip and finger abd B/L LLE- HF 4/5, KE, 4/5, DF and PF 5-/5 RLE- HF 3-/5, KE 3-/5 R BKA-healing well with staples intact no evidence of erythema or drainage  Assessment/Plan: 1. Functional deficits which require 3+ hours per day of interdisciplinary therapy in a comprehensive inpatient rehab setting.  Physiatrist is providing close team supervision and 24 hour management of active medical problems listed below.  Physiatrist and rehab team continue to assess barriers to discharge/monitor patient progress toward functional and medical goals  Care Tool:  Bathing    Body parts bathed by patient: Right arm,Left arm,Chest,Abdomen,Right upper leg,Left upper leg,Face   Body parts bathed by helper: Left lower leg,Buttocks,Front perineal area Body parts n/a: Right lower leg   Bathing assist Assist Level: Maximal Assistance - Patient 24 - 49%     Upper Body Dressing/Undressing Upper body dressing   What is the patient wearing?: Pull over shirt    Upper body assist Assist Level: Moderate Assistance - Patient 50 - 74%    Lower Body Dressing/Undressing Lower body dressing      What is the patient wearing?: Incontinence brief,Pants  Lower body assist Assist for lower body dressing: Maximal Assistance - Patient 25 - 49%     Toileting Toileting Toileting Activity did not occur (Clothing management and hygiene only): N/A (no void or bm)  Toileting assist Assist for toileting:  (not assessed at eval)     Transfers Chair/bed transfer  Transfers assist     Chair/bed transfer assist level: Contact Guard/Touching assist     Locomotion Ambulation   Ambulation assist   Ambulation activity did not occur: Safety/medical concerns          Walk 10 feet activity   Assist  Walk 10 feet activity did not occur: Safety/medical  concerns        Walk 50 feet activity   Assist Walk 50 feet with 2 turns activity did not occur: Safety/medical concerns         Walk 150 feet activity   Assist Walk 150 feet activity did not occur: Safety/medical concerns         Walk 10 feet on uneven surface  activity   Assist Walk 10 feet on uneven surfaces activity did not occur: Safety/medical concerns         Wheelchair     Assist Will patient use wheelchair at discharge?: Yes Type of Wheelchair: Manual    Wheelchair assist level: Supervision/Verbal cueing Max wheelchair distance: 300'    Wheelchair 50 feet with 2 turns activity    Assist        Assist Level: Supervision/Verbal cueing   Wheelchair 150 feet activity     Assist      Assist Level: Supervision/Verbal cueing   Blood pressure (!) 172/78, pulse (!) 55, temperature 98.6 F (37 C), temperature source Oral, resp. rate 17, height '6\' 2"'$  (1.88 m), weight 120.1 kg, SpO2 91 %.  Medical Problem List and Plan: 1.  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  Wound VAC as directed             -patient may  Shower once VAC off- and R BKA covered             -ELOS/Goals: 14-18 days- mod I to supervision  -Continue CIR  -Interdisciplinary Team Conference today  2.  Antithrombotics: -DVT/anticoagulation: SCDs left lower extremity             -antiplatelet therapy: Continue Aspirin 81 mg daily 3. Pain Management: Lyrica 50 mg 3 times daily, Robaxin and oxycodone as needed- add Duloxetine 20 mg QHS for nerve pain  2/27- no side effects so far- con't regimen  2/28: waking at night for pain medication. Add Oxycontin '10mg'$  BID, discussed how this works and using less PRN medication.   3/1: shrinker is painful for patient- discussed with Sarah OT applying ace wrap for now and I have ordered a new shrinker.   3/2: Stop log-acting oxycodone given that it did  not help significantly and because patient has history of opioid addiction  3/3: Please try TENS and mirror therapy- will message therapy. Will discuss with Dr. Jess Barters team nerve block and nitroglycerin patch as options.   3/4: will discuss plan for opioid taper with patient: 3/4: oxycodone '10mg'$  4H prn, 3/7: oxycodone '5mg'$  q4H prn, 3/9: oxycodone '5mg'$  q6H prn, 3/12: oxycodone '5mg'$  q8H prn, 3/15: oxycodone '5mg'$  q12H prn, 3/16: oxycodone '5mg'$  daily prn, 3/17: discharge- no opioids upon discharge.  3/7: discussed alternative pain options with Dr. Sharol Given, such as IV Ketamine and Nitroglycerin patches. Consulted pharmacy  and they did not recommend IV Ketamine given patient's Cr. Will discuss Nitrolgycerin patch today.   3/8: pain is better controlled. Wean oxycodone to '5mg'$  q4H prn today 4. Mood: Provide emotional support             -antipsychotic agents: N/A 5. Neuropsych: This patient is capable of making decisions on his own behalf. 6. Skin/Wound Care: Routine skin checks. May d/c wound vac 2/28 7. Fluids/Electrolytes/Nutrition: Routine in and outs with follow-up chemistries 8.  Acute on chronic anemia.  Continue Niferex.    3/3: Hgb reviewed, 8.3, repeat Monday  9.  AKI/ATN.  Follow-up nephrology services.  No current plan for hemodialysis- Current Cr 5.76  3/8: discussed etiologies of his ATN: Cr down to 5.08 appreciate nephrology following, note reviewed. Repeat CMP tomorrow.  10.  Hypertension.  Hydralazine 50 mg twice daily, Norvasc 10 mg daily, clonidine 0.1 mg twice daily, Tenormin 50 mg daily.  Monitor with increased mobility  3/4: systolic BP elevated on last 5 reads: increase clonidine to .'2mg'$  (can also help with pain) 11.  Diabetes mellitus with peripheral neuropathy.  Hemoglobin A1c 10.5.  NovoLog 4 units 3 times daily, Lantus insulin 35 units nightly.  Check blood sugars before meals and at bedtime  2/27- BGs 127-160- con't regimen for now-  12.  Hyperlipidemia.  Lovaza 13.  Urinary retention  with scrotal edema.  Flomax 0.4 mg daily.  Scrotal edema improving, d/c foley 14.  History of tobacco abuse.  Counseling 15. Protein malnutrition- severe- Alb 1.5- cause of edema likely 16. LE edema- severe- don't see Diuretic- likely due to Renal issues- elevating scrotum- con't regimen  2/27- see #13  3/4: improving 17. Insomnia- try Trazodone 25-50 mg QHS for sleep   2/27- working- con't regimen 18. Transaminitis: stable, continue tylenol '650mg'$  TID with daily LFT check.  3/7: LFTs reviewed and are improving 19. Bradycardia:now symptomatic , reduce atenolol, per nephro will get cardiology to eval as well  continue to monitor TID  20.  Shortness of breath worsened with sitting forward.  He does have anasarca and certainly would expect some pleural effusions.  Nephrology is aggressively diuresing.  In addition he does have bradycardia.  As per nephrology recommendations will ask for cardiology assistance. We will check EKG, chest x-ray, reviewed labs from this morning no concerns.  3/7: labs improving! CXR shows pulmonary edema. Diuresis per nephrology.  21. Disposition: discussed d/c date of 3/17 with patient- he prefers earlier d/c. Discussed with Vicente Males PT. Addendum: had lengthy discussion with wife. Decided on d/c Saturday. Will schedule caregiver training Thursday to make sure she feels comfortable with this. I reviewed his medical issues with her. Her biggest concern is weaning off oxycodone- he has not used today- I will discuss with patient tomorrow stopping the medication.   >35 minutes spent in discussion with patient, wife, RN, therapists, and SW change in plans for discharge, scheduling caregiver training, pain control; updating wife and patient on medical issues and lab trends; discussion of plan of care when patient leaves here, importance of scheduling outpatient PCP appointment now for Cr/Hgb/LFT check      LOS: 10 days A FACE TO FACE EVALUATION WAS [place] 01/26/2021, 9:29 AM

## 2021-01-27 DIAGNOSIS — R001 Bradycardia, unspecified: Secondary | ICD-10-CM

## 2021-01-27 LAB — GLUCOSE, CAPILLARY
Glucose-Capillary: 111 mg/dL — ABNORMAL HIGH (ref 70–99)
Glucose-Capillary: 155 mg/dL — ABNORMAL HIGH (ref 70–99)
Glucose-Capillary: 104 mg/dL — ABNORMAL HIGH (ref 70–99)
Glucose-Capillary: 67 mg/dL — ABNORMAL LOW (ref 70–99)
Glucose-Capillary: 86 mg/dL (ref 70–99)

## 2021-01-27 LAB — COMPREHENSIVE METABOLIC PANEL
ALT: 91 U/L — ABNORMAL HIGH (ref 0–44)
AST: 38 U/L (ref 15–41)
Albumin: 2.1 g/dL — ABNORMAL LOW (ref 3.5–5.0)
Alkaline Phosphatase: 467 U/L — ABNORMAL HIGH (ref 38–126)
Anion gap: 9 (ref 5–15)
BUN: 70 mg/dL — ABNORMAL HIGH (ref 8–23)
CO2: 16 mmol/L — ABNORMAL LOW (ref 22–32)
Calcium: 8.4 mg/dL — ABNORMAL LOW (ref 8.9–10.3)
Chloride: 112 mmol/L — ABNORMAL HIGH (ref 98–111)
Creatinine, Ser: 4.66 mg/dL — ABNORMAL HIGH (ref 0.61–1.24)
GFR, Estimated: 13 mL/min — ABNORMAL LOW (ref >60)
Glucose, Bld: 116 mg/dL — ABNORMAL HIGH (ref 70–99)
Potassium: 4.6 mmol/L (ref 3.5–5.1)
Sodium: 137 mmol/L (ref 135–145)
Total Bilirubin: 2.6 mg/dL — ABNORMAL HIGH (ref 0.3–1.2)
Total Protein: 6.3 g/dL — ABNORMAL LOW (ref 6.5–8.1)

## 2021-01-27 MED ORDER — WITCH HAZEL-GLYCERIN EX PADS: MEDICATED_PAD | CUTANEOUS | 12 refills | Status: AC | PRN

## 2021-01-27 MED ORDER — HYDRALAZINE HCL 100 MG PO TABS: 100.0000 mg | ORAL_TABLET | Freq: Two times a day (BID) | ORAL | 0 refills | Status: DC

## 2021-01-27 MED ORDER — SODIUM BICARBONATE 650 MG PO TABS: 1300.0000 mg | ORAL_TABLET | Freq: Three times a day (TID) | ORAL | 0 refills | Status: AC

## 2021-01-27 MED ORDER — PREGABALIN 50 MG PO CAPS
50.0000 mg | ORAL_CAPSULE | Freq: Three times a day (TID) | ORAL | 0 refills | Status: AC
Start: 2021-01-27 — End: ?

## 2021-01-27 MED ORDER — TRAZODONE HCL 50 MG PO TABS: 25.0000 mg | ORAL_TABLET | Freq: Every day | ORAL | 0 refills | Status: AC

## 2021-01-27 MED ORDER — POLYSACCHARIDE IRON COMPLEX 150 MG PO CAPS: 150.0000 mg | ORAL_CAPSULE | Freq: Every day | ORAL | 0 refills | Status: AC

## 2021-01-27 MED ORDER — TAMSULOSIN HCL 0.4 MG PO CAPS
0.4000 mg | ORAL_CAPSULE | Freq: Every day | ORAL | 0 refills | Status: AC
Start: 2021-01-27 — End: ?

## 2021-01-27 MED ORDER — SEVELAMER CARBONATE 800 MG PO TABS: 800.0000 mg | ORAL_TABLET | Freq: Three times a day (TID) | ORAL | 0 refills | Status: AC

## 2021-01-27 MED ORDER — AMLODIPINE BESYLATE 10 MG PO TABS: 10.0000 mg | ORAL_TABLET | Freq: Every day | ORAL | 0 refills | Status: AC

## 2021-01-27 MED ORDER — INSULIN GLARGINE 100 UNIT/ML SOLOSTAR PEN: 20.0000 [IU] | PEN_INJECTOR | Freq: Every day | SUBCUTANEOUS | 11 refills | Status: AC

## 2021-01-27 MED ORDER — METHOCARBAMOL 500 MG PO TABS: 500.0000 mg | ORAL_TABLET | Freq: Four times a day (QID) | ORAL | 0 refills | Status: AC | PRN

## 2021-01-27 MED ORDER — FISH OIL 500 MG PO CAPS: 500.0000 mg | ORAL_CAPSULE | Freq: Every day | ORAL | 0 refills | Status: AC

## 2021-01-27 MED ORDER — DOCUSATE SODIUM 100 MG PO CAPS: 100.0000 mg | ORAL_CAPSULE | Freq: Two times a day (BID) | ORAL | 0 refills | Status: AC

## 2021-01-27 MED ORDER — ROSUVASTATIN CALCIUM 20 MG PO TABS: 20.0000 mg | ORAL_TABLET | Freq: Every day | ORAL | 0 refills | Status: AC

## 2021-01-27 MED ORDER — CLONIDINE HCL 0.2 MG PO TABS: 0.2000 mg | ORAL_TABLET | Freq: Two times a day (BID) | ORAL | 0 refills | Status: DC

## 2021-01-27 NOTE — Progress Notes (Incomplete)
Physical Therapy Session Note  Patient Details  Name: Fernando Anthony MRN: [id number] Date of Birth: 1956-03-19  {CHL IP REHAB PT TIME CALCULATION:[id number]}  Short Term Goals: Week 2:  PT Short Term Goal 1 (Week 2): pt will transfer sit<>stand with LRAD and CGA PT Short Term Goal 2 (Week 2): pt will transfer bed<>WC with LRAD and CGA PT Short Term Goal 3 (Week 2): pt will iniate gait training  Skilled Therapeutic Interventions/Progress Updates:    ***  Therapy Documentation Precautions:  Precautions Precautions: Fall Precaution Comments: RLE wound vac/ swollen scotum Required Braces or Orthoses: Other Brace Other Brace: R residual limb guard and R shrinker- Pt still not wearing shrinker.  Says it is too painful due to swelling. Encouraged patient to wear shrinker but continues not to. Restrictions Weight Bearing Restrictions: Yes RLE Weight Bearing: Non weight bearing General:    Therapy/Group: Individual Therapy  Christian P Manhard PT 01/27/2021, 7:42 AM

## 2021-01-27 NOTE — Progress Notes (Signed)
Physical Therapy Session Note  Patient Details  Name: Fernando Anthony MRN: [id number] Date of Birth: Sep 04, 1956  Today's Date: 01/27/2021 PT Individual Time: 1130-1145 PT Individual Time Calculation (min): 15 min  and Today's Date: 01/27/2021 PT Missed Time: 15 Minutes Missed Time Reason: Patient unwilling to participate  Short Term Goals: Week 2:  PT Short Term Goal 1 (Week 2): pt will transfer sit<>stand with LRAD and CGA PT Short Term Goal 2 (Week 2): pt will transfer bed<>WC with LRAD and CGA PT Short Term Goal 3 (Week 2): pt will iniate gait training  Skilled Therapeutic Interventions/Progress Updates:    Patient received reclined in bed, asleep, but easy to wake. He denies pain. He does report "busting open" his incision during previous therapy session. However, PT observing intact staple line and minimal serosanginous discharge from medial aspect of incision. Patient refusing to complete therapeutic tasks due to fear of inuring limb further, but was agreeable to minimal education. PT educating patient on de-sensitization techniques, tools for managing phantom pain/sensations and importance of pre-prosthetic strengthening. Patient verbalizing understanding, but continued to refuse to participate in further therapy. He remained in bed, bed alarm on, call light within reach.   Therapy Documentation Precautions:  Precautions Precautions: Fall Precaution Comments: RLE wound vac/ swollen scotum Required Braces or Orthoses: Other Brace Other Brace: R residual limb guard and R shrinker- Pt still not wearing shrinker.  Says it is too painful due to swelling. Encouraged patient to wear shrinker but continues not to. Restrictions Weight Bearing Restrictions: Yes RLE Weight Bearing: Non weight bearing    Therapy/Group: Individual Therapy  Karoline Caldwell, PT, DPT, CBIS  01/27/2021, 7:37 AM

## 2021-01-27 NOTE — Progress Notes (Signed)
Physical Therapy Session Note  Patient Details  Name: Fernando Anthony MRN: [id number] Date of Birth: [birth year]  Today's Date: 01/27/2021 PT Individual Time: 1000-1045 PT Individual Time Calculation (min): 45 min   Short Term Goals: Week 2:  PT Short Term Goal 1 (Week 2): pt will transfer sit<>stand with LRAD and CGA PT Short Term Goal 2 (Week 2): pt will transfer bed<>WC with LRAD and CGA PT Short Term Goal 3 (Week 2): pt will iniate gait training  Skilled Therapeutic Interventions/Progress Updates:    Pt received supine in bed, RN at bedside providing routine medications - patient agreeable to therapy but hesitant, reports "i'm better off just laying in bed, i'm going home tomorrow." With encouragement, ultimately agreeable. Reports unrated R residual pain - RN made aware and rest breaks provided as needed. Patient with shrinker on but donned limb guard with totalA while educating patient on placement and technique. Supine<>sit with supervision with use of bed features, able to sit EOB with supervision. Performed lateral scoot transfer from EOB to w/c with CGA, towards L side. W/c transport for energy conservation from his room to ortho gym with focus on functional car transfers. Also educated him on w/c management and parts but patient reports that his wife will help him with all of that. (Family ed scheduled for 3pm today). Performed car transfer with same lateral scoot technique, requiring CGA to enter car but minA for getting out of car as that required him to transfer towards his R side (R BKA). Discussed strategies to improve and patient receptive but reporting fatigue and requesting to return to room. W/c transport back to his room, squat<>pivot with CGA/minA towards R side from w/c to EOB. Able to perform sit>supine with supervision. Removed limb guard and noted mild sanguineous drainage from residual limb, on medial side - RN made aware who came to bedside to assess. Pt reports no episode of  pain during session that may have contributed to this, as limb guard on throughout session. Therapist cleaned out his limb guard and patient remained supine in bed with needs in reach, in no distress, RN assisting him.  Therapy Documentation Precautions:  Precautions Precautions: Fall Precaution Comments: RLE wound vac/ swollen scotum Required Braces or Orthoses: Other Brace Other Brace: R residual limb guard and R shrinker- Pt still not wearing shrinker.  Says it is too painful due to swelling. Encouraged patient to wear shrinker but continues not to. Restrictions Weight Bearing Restrictions: Yes RLE Weight Bearing: Non weight bearing General: PT Amount of Missed Time (min): 15 Minutes PT Missed Treatment Reason: Patient fatigue  Therapy/Group: Individual Therapy  Jaidon Sponsel P Courtany Mcmurphy PT 01/27/2021, 10:52 AM

## 2021-01-27 NOTE — Progress Notes (Signed)
Occupational Therapy Session Note  Patient Details  Name: Fernando Anthony MRN: [id number] Date of Birth: February 19, 1956  Today's Date: 01/27/2021 OT Individual Time: [id number] Individual Time Calculation (min): 46 min  and Today's Date: 01/27/2021 OT Missed Time: 14 Minutes Missed Time Reason: Patient fatigue   Short Term Goals: Week 2:  OT Short Term Goal 1 (Week 2): Pt will perform BSC/toilet transfer with Min A and LRAD OT Short Term Goal 2 (Week 2): Pt will perform LB dress with AE PRN with no more than Mod A OT Short Term Goal 3 (Week 2): Pt will complete toileting with Min A OT Short Term Goal 4 (Week 2): Pt will remain OOB for 2 hours to progress towards completing self- care tasks OOB  Skilled Therapeutic Interventions/Progress Updates:    Treatment session with focus on self-care tasks and functional transfers.  Pt received semi-reclined in bed reporting minimal sleep overnight but decrease in pain overall.  Pt reports plan to d/c home tomorrow.  Pt agreeable to completing toilet transfer to assess transfer technique.  Pt asking about a slide board, discussed that he will most likely not need it but obtained to attempt.  Therapist donned shrinker and MD arrived for morning rounds.  Therapist educated on slide board placement and completed transfer after setup with supervision to w/c.  Pt positioned w/c next to toilet and completed lateral scoot transfer to toilet with CGA.  Pt requested therapist step out for privacy.  Pt completed clothing management and toileting Mod I.  Pt returned to w/c CGA again with straight lateral scoot.  Educated on modifying positioning of w/c to toilet to increase angle to allow for more squat pivot/scoot transfer to increase weight shifting and safety.  Pt agreeable that that technique may be better.  Pt transferred squat pivot/scoot back to bed supervision without slide board and returned to semi-reclined.  Pt reports fatigue and requesting to rest to allow for  some energy for other therapy sessions.    Pt with questions about tub transfer bench, reached out to [facility].  Current plan is for wife to be present tomorrow for education with PT prior to D/C.  Therapy Documentation Precautions:  Precautions Precautions: Fall Precaution Comments: RLE wound vac/ swollen scotum Required Braces or Orthoses: Other Brace Other Brace: R residual limb guard and R shrinker- Pt still not wearing shrinker.  Says it is too painful due to swelling. Encouraged patient to wear shrinker but continues not to. Restrictions Weight Bearing Restrictions: Yes RLE Weight Bearing: Non weight bearing General:   Vital Signs: Therapy Vitals Temp: 98.2 F (36.8 C) Temp Source: Oral Pulse Rate: (!) 58 Resp: 14 BP: (!) 152/77 Patient Position (if appropriate): Lying Oxygen Therapy SpO2: 95 % O2 Device: Room Air Pain: Pain Assessment Pain Scale: 0-10 Pain Score: 7  Pain Type: Surgical pain Pain Location: Leg Pain Orientation: Right Pain Descriptors / Indicators: Aching;Throbbing Pain Frequency: Constant Pain Onset: On-going Patients Stated Pain Goal: 2 Pain Intervention(s): Medication (See eMAR)   Therapy/Group: Individual Therapy  [name] 01/27/2021, 8:54 AM

## 2021-01-27 NOTE — Progress Notes (Signed)
Patient ID: Fernando Anthony, male   DOB: 1956-07-20, 65 y.o.   MRN: [id number] S: Patient continues to feel well today.  No shortness of breath.  Urine output adequate.  Creatinine improving O:BP (!) 152/77 (BP Location: Left Arm)   Pulse (!) 58   Temp 98.2 F (36.8 C) (Oral)   Resp 14   Ht '6\' 2"'$  (1.88 m)   Wt 120.1 kg   SpO2 95%   BMI 33.99 kg/m   Intake/Output Summary (Last 24 hours) at 01/27/2021 1208 Last data filed at 01/27/2021 0800 Gross per 24 hour  Intake 600 ml  Output 2 ml  Net 598 ml   Intake/Output: I/O last 3 completed shifts: In: 700 [P.O.:700] Out: 2 [Urine:1; Stool:1]  Intake/Output this shift:  Total I/O In: 240 [P.O.:240] Out: -  Weight change:  Gen: NAD CVS: Bradycardic Resp: Bilateral chest rise, no increased work of breathing Abd: +BS, soft, NT/ND Ext: s/p RBKA, edema present in left lower extremity  Recent Labs  Lab 01/21/21 0253 01/22/21 [id number] 01/23/21 0216 01/24/21 0235 01/25/21 0424 01/26/21 0458 01/27/21 0529  NA 137 135 135 136 135 135 137  K 5.0 4.7 4.9 5.3* 5.1 4.9 4.6  CL 106 110 111 109 111 112* 112*  CO2 19* 16* 14* 16* 15* 14* 16*  GLUCOSE 112* 107* 88 148* 93 130* 116*  BUN 79* 79* 79* 78* 79* 75* 70*  CREATININE 5.79* 5.85* 5.63* 5.36* 5.30* 5.02* 4.66*  ALBUMIN 1.7* 1.7* 1.7* 1.8* 1.9* 2.3* 2.1*  CALCIUM 8.3* 8.1* 8.1* 8.2* 8.3* 8.5* 8.4*  PHOS  --   --   --   --  6.9*  --   --   AST 56* 77* 73* 60* 54* 49* 38  ALT 106* 110* 112* 106* 101* 105* 91*   Liver Function Tests: Recent Labs  Lab 01/25/21 0424 01/26/21 0458 01/27/21 0529  AST 54* 49* 38  ALT 101* 105* 91*  ALKPHOS 539* 548* 467*  BILITOT 3.1* 3.4* 2.6*  PROT 5.9* 6.9 6.3*  ALBUMIN 1.9* 2.3* 2.1*   No results for input(s): LIPASE, AMYLASE in the last 168 hours. No results for input(s): AMMONIA in the last 168 hours. CBC: Recent Labs  Lab 01/21/21 0253 01/25/21 0424  WBC  --  6.1  HGB 8.3* 7.7*  HCT 23.7* 22.8*  MCV  --  93.8  PLT  --  182   Cardiac  Enzymes: No results for input(s): CKTOTAL, CKMB, CKMBINDEX, TROPONINI in the last 168 hours. CBG: Recent Labs  Lab 01/26/21 1115 01/26/21 1630 01/26/21 2100 01/27/21 0626 01/27/21 1148  GLUCAP 167* 95 203* 111* 155*    Iron Studies:  Recent Labs    01/25/21 1139  IRON 49  TIBC 276  FERRITIN 290   Studies/Results: No results found. Marland Kitchen acetaminophen  650 mg Oral Q8H  . amLODipine  10 mg Oral Daily  . aspirin EC  81 mg Oral QHS  . cloNIDine  0.2 mg Oral BID  . darbepoetin (ARANESP) injection - NON-DIALYSIS  40 mcg Subcutaneous Q Wed-1800  . docusate sodium  100 mg Oral BID  . hydrALAZINE  100 mg Oral BID  . insulin aspart  0-15 Units Subcutaneous TID WC  . insulin aspart  1 Units Subcutaneous TID WC  . insulin glargine  20 Units Subcutaneous Daily  . omega-3 acid ethyl esters  1 g Oral Daily  . pregabalin  50 mg Oral TID  . sevelamer carbonate  800 mg Oral TID WC  .  sodium bicarbonate  1,300 mg Oral TID  . tamsulosin  0.4 mg Oral QPC supper  . traZODone  25-50 mg Oral QHS    BMET    Component Value Date/Time   NA 137 01/27/2021 0529   K 4.6 01/27/2021 0529   CL 112 (H) 01/27/2021 0529   CO2 16 (L) 01/27/2021 0529   GLUCOSE 116 (H) 01/27/2021 0529   BUN 70 (H) 01/27/2021 0529   CREATININE 4.66 (H) 01/27/2021 0529   CALCIUM 8.4 (L) 01/27/2021 0529   GFRNONAA 13 (L) 01/27/2021 0529   GFRAA >60 11/29/2019 2137   CBC    Component Value Date/Time   WBC 6.1 01/25/2021 0424   RBC 2.43 (L) 01/25/2021 0424   HGB 7.7 (L) 01/25/2021 0424   HCT 22.8 (L) 01/25/2021 0424   PLT 182 01/25/2021 0424   MCV 93.8 01/25/2021 0424   MCH 31.7 01/25/2021 0424   MCHC 33.8 01/25/2021 0424   RDW 21.9 (H) 01/25/2021 0424   LYMPHSABS 1.1 01/18/2021 0209   MONOABS 1.0 01/18/2021 0209   EOSABS 0.2 01/18/2021 0209   BASOSABS 0.1 01/18/2021 0209    Assessment/Plan:  1. AKI/CKD stage IIIa: Creatinine as low as 1.1 in 2021 and was 4 on arrival.  Creatinine peaked at 6 and has  been very slow to improve.  Serological work-up nonrevealing.  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 Creatinine finally starting to downtrend.  4.6 today 2. Hold albumin for now 3. No uremic symptoms or need for dialysis at this time 4. No indication for biopsy at this time 2. Bradycardia -cardiology following.  Thought to be related to decreased clearance of atenolol given his significant renal insufficiency.  Continue to monitor.  Asymptomatic today 3. SOB -improved today.  No oxygen 4. 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 Per orthopedics.  5. [id number] held.  Blood pressure elevated at this time currently taking clonidine 0.2 twice daily, hydralazine 50 mg twice daily, amlodipine 10 mg daily.  Hopefully will improve with improving volume status.  Blood pressure does seem to be improving 6. Anemia- Secondary to chronic illness. Tsat low, holding IV Fe in the setting of active infection. AKI contributing as well. aranesp 40 mcg once on 2/23. On oral iron. PRBC's on 2/26 given. Reorder aranesp to be given on Wed given falling hgb.  Iron stores remain depleted.  Feraheme as ordered for 2 doses 7. Metabolic acidosis: Likely associated with AKI.  On supplemental bicarb.  Improving 8. hyperphosphatemia - renal diet.  Continue Renvela 9. DM - Insulin per primary team.  10. Severe protein malnutrition - per primary 11. Disposition - cont with PT/OT 12. Abnormal LFT's -hepatitis panel negative and right upper quadrant okay.  Continue to monitor

## 2021-01-27 NOTE — Progress Notes (Signed)
Occupational Therapy Discharge Summary  Patient Details  Name: Fernando Anthony MRN: [id number] Date of Birth: [birth year]  Patient has met 3 of 10 long term goals due to ability to compensate for deficits and decreased pain.  Patient to discharge at overall Supervision to [facility] for transfers and min-mod assist for bathing and dressing level.  Patient's care partner is independent to provide the necessary physical assistance at discharge.  Patient's wife has been assisting with self-care tasks of bathing and dressing as pt very private and not wanting therapist or nursing staff to assist with self-care tasks.  Pt reports plan for wife to continue to assist with self-care tasks upon d/c.  Pt is able to complete toilet transfers CGA lateral scoot w/c<> toilet and toileting Mod I with lateral leans on toilet.  Pt is requesting to d/c home earlier and due to the fact that wife is providing assistance currently with self-care tasks, MD has agreed to d/c Thursday 3/10 pending education with wife.    Reasons goals not met: Pt has not completed any bathing or dressing tasks with therapy or nursing staff due to wife assisting.  Pt continues to require CGA to Supervision for transfers due to pain and decreased participation in therapy sessions due to pain.  Recommendation:  Patient will not require follow up OT at this time, however pt will benefit from continued PT.  Equipment: pt to purchase tub transfer bench  Reasons for discharge: discharge from hospital  Patient/family agrees with progress made and goals achieved: Yes  OT Discharge Precautions/Restrictions  Precautions Precautions: Fall Required Braces or Orthoses: Other Brace Other Brace: limb guard Vital Signs  Pain Pain Assessment Pain Scale: 0-10 Pain Score: 7  Pain Type: Surgical pain Pain Location: Leg Pain Orientation: Right Pain Descriptors / Indicators: Aching;Throbbing Pain Frequency: Constant Pain Onset: On-going Patients Stated  Pain Goal: 2 Pain Intervention(s): Medication (See eMAR) ADL ADL Eating: Not assessed Grooming: Setup Where Assessed-Grooming: Edge of bed,Bed level Upper Body Bathing: Contact guard Lower Body Bathing: Maximal assistance Upper Body Dressing: Moderate assistance Where Assessed-Upper Body Dressing: Bed level Lower Body Dressing: Moderate assistance Where Assessed-Lower Body Dressing: Bed level Toileting: Modified independent Where Assessed-Toileting: Editor, commissioning Method: Sit pivot ADL Comments: limited progress due to pt's wife providing self-care and dressing for pt due to preference to not complete with therapy staff/nursing staff.  Scores taken per pt report. Vision Baseline Vision/History: No visual deficits Patient Visual Report: No change from baseline Perception  Perception: Within Functional Limits Praxis Praxis: Intact Cognition Overall Cognitive Status: Within Functional Limits for tasks assessed Arousal/Alertness: Awake/alert Memory: Appears intact Awareness: Appears intact Problem Solving: Appears intact Safety/Judgment: Appears intact Sensation Sensation Light Touch: Impaired by gross assessment Proprioception: Impaired by gross assessment Coordination Gross Motor Movements are Fluid and Coordinated: No Fine Motor Movements are Fluid and Coordinated: Yes Coordination and Movement Description: 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, decreased balance/postural control, generalized weakness, pain, and decreased endurance Finger Nose Finger Test: WNL Motor  Motor Motor: Abnormal postural alignment and control Motor - Skilled Clinical Observations: 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, generalized weakness, decreased balance/postural control, and pain Mobility  Bed Mobility Bed Mobility: Rolling Right;Rolling Left;Sit to Supine;Supine to Sit Rolling Right: Independent with assistive device Rolling Left: Independent with  assistive device Supine to Sit: Independent with assistive device Sit to Supine: Independent with assistive device Transfers Sit to Stand: Contact Guard/Touching assist Stand to Sit: Contact Guard/Touching assist  Trunk/Postural Assessment  Cervical Assessment Cervical Assessment: Within Functional Limits Thoracic Assessment Thoracic Assessment: Within Functional Limits Lumbar Assessment Lumbar Assessment: Exceptions to [facility] (posterior pelvic tilt) Postural Control Postural Control: Deficits on evaluation  Balance Balance Balance Assessed: Yes Static Sitting Balance Static Sitting - Balance Support: Feet supported;Bilateral upper extremity supported Static Sitting - Level of Assistance: 7: Independent Dynamic Sitting Balance Dynamic Sitting - Balance Support: Feet supported;No upper extremity supported Dynamic Sitting - Level of Assistance: 6: Modified independent (Device/Increase time) (increased time) Static Standing Balance Static Standing - Balance Support: Bilateral upper extremity supported (RW) Static Standing - Level of Assistance: 5: Stand by assistance (CGA) Dynamic Standing Balance Dynamic Standing - Balance Support: Bilateral upper extremity supported (RW) Dynamic Standing - Level of Assistance: 5: Stand by assistance (CGA) Extremity/Trunk Assessment RUE Assessment RUE Assessment: Within Functional Limits General Strength Comments: generalized weakness present but ROM/strength functional LUE Assessment LUE Assessment: Within Functional Limits General Strength Comments: generalized weakness present but ROM/strength functional   Aeden Matranga, [facility] 01/29/2021, 4:14 PM

## 2021-01-27 NOTE — Progress Notes (Signed)
Physical Therapy Session Note  Patient Details  Name: Fernando Anthony MRN: [id number] Date of Birth: Jan 25, 1956  Today's Date: 01/27/2021 PT Individual Time: 3748-2707 PT Individual Time Calculation (min): 29 min   Short Term Goals: Week 1:  PT Short Term Goal 1 (Week 1): Pt will perform STS with min A and LRAD. PT Short Term Goal 1 - Progress (Week 1): Met PT Short Term Goal 2 (Week 1): Pt will propel wc  x 150 ft with supervision PT Short Term Goal 2 - Progress (Week 1): Met PT Short Term Goal 3 (Week 1): Pt will be independent with bed mobility. PT Short Term Goal 3 - Progress (Week 1): Progressing toward goal PT Short Term Goal 4 (Week 1): Pt will iniate gait training with LRAD. PT Short Term Goal 4 - Progress (Week 1): Progressing toward goal Week 2:  PT Short Term Goal 1 (Week 2): pt will transfer sit<>stand with LRAD and CGA PT Short Term Goal 2 (Week 2): pt will transfer bed<>WC with LRAD and CGA PT Short Term Goal 3 (Week 2): pt will iniate gait training  Skilled Therapeutic Interventions/Progress Updates:   Received pt semi-reclined in bed with wife present for family education training. Pt reported his incision began bleeding after PT this morning and currently found with continued mild drainage. RN notified, present to inspect limb, and notified MD regarding drainage. Noted shrinker in pt's room soiled with blood; PT washed shrinker and laid flat to dry (educated wife on shrinker care instructions). Pt declined going down to gym to practice car transfer, verbalizing confidence from practicing with PT earlier this morning. Pt also declined practicing any hands on transfers OOB with wife due to drainage in residual limb stating "I'm just ready to get out of here". Therefore, talked through technique for lateral scoot transfers and recommended that pt and wife stick to lateral scoot transfers rather than stand<>pivot transfers for safety concerns and due to not practicing during family  education session. Discussed alternatives for toileting as pt's wife concerned that WC would not fit into bathroom. Concluded session with pt semi-reclined in bed, needs within reach, and bed alarm on. Pt and pt's wife with no further questions regarding D/C.   Therapy Documentation Precautions:  Precautions Precautions: Fall Precaution Comments: RLE wound vac/ swollen scotum Required Braces or Orthoses: Other Brace Other Brace: R residual limb guard and R shrinker- Pt still not wearing shrinker.  Says it is too painful due to swelling. Encouraged patient to wear shrinker but continues not to. Restrictions Weight Bearing Restrictions: Yes RLE Weight Bearing: Non weight bearing   Therapy/Group: Individual Therapy Alfonse Alpers PT, DPT   01/27/2021, 7:40 AM

## 2021-01-27 NOTE — Progress Notes (Signed)
RN removed staples from right BKA.  RN only removed 28 staples due to 2 staples showed a sanguineous drainage and RN kept those in.  RN then applied a dressing to remain over night and RN will remove last 2 staples prior to discharge on 01/28/21.  Patient did have some discomfort during staple removal and asked for Tylenol when finished.  Tylenol given and RN will reassess pain in a hour.

## 2021-01-27 NOTE — Progress Notes (Signed)
PT let me know this morning that patient was having some bleeding from his incision site.  RN assessed and found the incision was oozing blood from 1 staple.  RN cleaned incision site with 4x4s and sterile water.  Now PT is in doing education with spouse and patient and notices incision site oozing again.  RN cleaned incision site with 4x4 and sterile water.  One staple is oozing blood.  Dr. Ranell Patrick informed.  Dr. Ranell Patrick stated, "Remove the staples and apply dressing to the area."  RN will remove the staples and place a dressing over the area.

## 2021-01-27 NOTE — Progress Notes (Signed)
Patient ID: Fernando Anthony, male   DOB: 1956/08/02, 65 y.o.   MRN: [id number]  Pt's wife to come in tomorrow for family education in anticipation of discharge. Will be dependent upon how wife and pt do. Pt will require more care if going home sooner than expected date. Will have wheelchair delivered tomorrow in case discharging tomorrow.

## 2021-01-27 NOTE — Progress Notes (Signed)
PROGRESS NOTE   Subjective/Complaints: No complaints this morning He would like to go home tomorrow Later had bleeding from incision site; discussed with RN removing staples  ROS:  Pt denies SOB when laying back but has it when he sits up, abd pain, CP, N/V/D   Objective:   No results found. Recent Labs    01/25/21 0424  WBC 6.1  HGB 7.7*  HCT 22.8*  PLT 182   Recent Labs    01/26/21 0458 01/27/21 0529  NA 135 137  K 4.9 4.6  CL 112* 112*  CO2 14* 16*  GLUCOSE 130* 116*  BUN 75* 70*  CREATININE 5.02* 4.66*  CALCIUM 8.5* 8.4*    Intake/Output Summary (Last 24 hours) at 01/27/2021 1626 Last data filed at 01/27/2021 1300 Gross per 24 hour  Intake 660 ml  Output --  Net 660 ml        Physical Exam: Vital Signs Blood pressure 138/69, pulse (!) 52, temperature 98.4 F (36.9 C), temperature source Oral, resp. rate 17, height '6\' 2"'$  (1.88 m), weight 120.1 kg, SpO2 95 %. Gen: no distress, normal appearing HEENT: oral mucosa pink and moist, NCAT Cardio: Bradycardic Chest: normal effort, normal rate of breathing Abd: soft, non-distended Ext: no edema Psych: pleasant, normal affect Skin: intact Musculoskeletal:     Cervical back: Normal range of motion. No rigidity.     Comments: UEs 5/5 in biceps, triceps, WE, grip and finger abd B/L LLE- HF 4/5, KE, 4/5, DF and PF 5-/5 RLE- HF 3-/5, KE 3-/5 R BKA-healing well with staples intact no evidence of erythema or drainage  Assessment/Plan: 1. Functional deficits which require 3+ hours per day of interdisciplinary therapy in a comprehensive inpatient rehab setting.  Physiatrist is providing close team supervision and 24 hour management of active medical problems listed below.  Physiatrist and rehab team continue to assess barriers to discharge/monitor patient progress toward functional and medical goals  Care Tool:  Bathing    Body parts bathed by patient:  Right arm,Left arm,Chest,Abdomen,Right upper leg,Left upper leg,Face   Body parts bathed by helper: Left lower leg,Buttocks,Front perineal area Body parts n/a: Right lower leg   Bathing assist Assist Level: Maximal Assistance - Patient 24 - 49%     Upper Body Dressing/Undressing Upper body dressing   What is the patient wearing?: Pull over shirt    Upper body assist Assist Level: Moderate Assistance - Patient 50 - 74%    Lower Body Dressing/Undressing Lower body dressing      What is the patient wearing?: Incontinence brief,Pants     Lower body assist Assist for lower body dressing: Maximal Assistance - Patient 25 - 49%     Toileting Toileting Toileting Activity did not occur (Clothing management and hygiene only): N/A (no void or bm)  Toileting assist Assist for toileting: Independent with assistive device     Transfers Chair/bed transfer  Transfers assist     Chair/bed transfer assist level: Contact Guard/Touching assist     Locomotion Ambulation   Ambulation assist   Ambulation activity did not occur: Safety/medical concerns (R BKA, fatigue, pain, decreased balance, poor endurance, weakness)  Walk 10 feet activity   Assist  Walk 10 feet activity did not occur: Safety/medical concerns (R BKA, fatigue, pain, decreased balance, poor endurance, weakness)        Walk 50 feet activity   Assist Walk 50 feet with 2 turns activity did not occur: Safety/medical concerns (R BKA, fatigue, pain, decreased balance, poor endurance, weakness)         Walk 150 feet activity   Assist Walk 150 feet activity did not occur: Safety/medical concerns (R BKA, fatigue, pain, decreased balance, poor endurance, weakness)         Walk 10 feet on uneven surface  activity   Assist Walk 10 feet on uneven surfaces activity did not occur: Safety/medical concerns (R BKA, fatigue, pain, decreased balance, poor endurance, weakness)          Wheelchair     Assist Will patient use wheelchair at discharge?: Yes Type of Wheelchair: Manual    Wheelchair assist level: Independent Max wheelchair distance: 184f    Wheelchair 50 feet with 2 turns activity    Assist        Assist Level: Independent   Wheelchair 150 feet activity     Assist      Assist Level: Independent   Blood pressure 138/69, pulse (!) 52, temperature 98.4 F (36.9 C), temperature source Oral, resp. rate 17, height '6\' 2"'$  (1.88 m), weight 120.1 kg, SpO2 95 %.  Medical Problem List and Plan: 1.  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  Wound VAC as directed             -patient may  Shower once VAC off- and R BKA covered             -ELOS/Goals: 14-18 days- mod I to supervision  -Continue CIR 2.  Antithrombotics: -DVT/anticoagulation: SCDs left lower extremity             -antiplatelet therapy: Continue Aspirin 81 mg daily 3. Pain Management: Lyrica 50 mg 3 times daily, Robaxin and oxycodone as needed- add Duloxetine 20 mg QHS for nerve pain  2/27- no side effects so far- con't regimen  2/28: waking at night for pain medication. Add Oxycontin '10mg'$  BID, discussed how this works and using less PRN medication.   3/1: shrinker is painful for patient- discussed with Sarah OT applying ace wrap for now and I have ordered a new shrinker.   3/2: Stop log-acting oxycodone given that it did not help significantly and because patient has history of opioid addiction  3/3: Please try TENS and mirror therapy- will message therapy. Will discuss with Dr. DJess Bartersteam nerve block and nitroglycerin patch as options.   3/4: will discuss plan for opioid taper with patient: 3/4: oxycodone '10mg'$  4H prn, 3/7: oxycodone '5mg'$  q4H prn, 3/9: oxycodone '5mg'$  q6H prn, 3/12: oxycodone '5mg'$  q8H prn, 3/15: oxycodone '5mg'$  q12H prn, 3/16: oxycodone '5mg'$  daily prn, 3/17: discharge- no opioids upon  discharge.  3/7: discussed alternative pain options with Dr. DSharol Given such as IV Ketamine and Nitroglycerin patches. Consulted pharmacy and they did not recommend IV Ketamine given patient's Cr. Will discuss Nitrolgycerin patch today.   3/8: pain is better controlled. Wean oxycodone to '5mg'$  q4H prn today  3/9: d/c oxycodone 4. Anxiety: Provide emotional support. Appreciate neuropsych eval, note reviewed, continue outpatient.              -antipsychotic agents: N/A 5. Neuropsych: This patient is capable of making decisions  on his own behalf. 6. Skin/Wound Care: Routine skin checks. May d/c wound vac 2/28 7. Fluids/Electrolytes/Nutrition: Routine in and outs with follow-up chemistries 8.  Acute on chronic anemia.  Continue Niferex.    3/3: Hgb reviewed, 8.3, repeat Monday  9.  AKI/ATN.  Follow-up nephrology services.  No current plan for hemodialysis- Current Cr 5.76  3/9: discussed etiologies of his ATN: Cr down to 4.66 appreciate nephrology following, note reviewed. Repeat CMP tomorrow.  10.  Hypertension.  Hydralazine 50 mg twice daily, Norvasc 10 mg daily, clonidine 0.1 mg twice daily, Tenormin 50 mg daily.  Monitor with increased mobility  3/4: systolic BP elevated on last 5 reads: increase clonidine to .'2mg'$  (can also help with pain) 11.  Diabetes mellitus with peripheral neuropathy.  Hemoglobin A1c 10.5.  NovoLog 4 units 3 times daily, Lantus insulin 35 units nightly.  Check blood sugars before meals and at bedtime  2/27- BGs 127-160- con't regimen for now-  12.  Hyperlipidemia.  Lovaza 13.  Urinary retention with scrotal edema.  Flomax 0.4 mg daily.  Scrotal edema improving, d/c foley 14.  History of tobacco abuse.  Counseling 15. Protein malnutrition- severe- Alb 1.5- cause of edema likely 16. LE edema- severe- don't see Diuretic- likely due to Renal issues- elevating scrotum- con't regimen  2/27- see #13  3/4: improving 17. Insomnia- try Trazodone 25-50 mg QHS for sleep   2/27- working-  con't regimen 18. Transaminitis: stable, continue tylenol '650mg'$  TID with daily LFT check.  3/7: LFTs reviewed and are improving 19. Bradycardia:now symptomatic , reduce atenolol, per nephro will get cardiology to eval as well  continue to monitor TID  20.  Shortness of breath worsened with sitting forward.  He does have anasarca and certainly would expect some pleural effusions.  Nephrology is aggressively diuresing.  In addition he does have bradycardia.  As per nephrology recommendations will ask for cardiology assistance. We will check EKG, chest x-ray, reviewed labs from this morning no concerns.  3/7: labs improving! CXR shows pulmonary edema. Diuresis per nephrology.  21. Disposition: discussed d/c date of 3/17 with patient- he prefers earlier d/c. Discussed with Vicente Males PT. Addendum: had lengthy discussion with wife. Plan for d/c tomorrow. I reviewed his medical issues with her. Her biggest concern is weaning off oxycodone- have d/ced today.    LOS: 11 days A FACE TO FACE EVALUATION WAS PERFORMED  Fernando Anthony 01/27/2021, 4:26 PM

## 2021-01-27 NOTE — Discharge Summary (Signed)
Physician Discharge Summary  Patient ID: Fernando Anthony MRN: [id number] DOB/AGE: 1956-08-10 65 y.o.  Admit date: 01/16/2021 Discharge date: 01/28/2021  Discharge Diagnoses:  Principal Problem:   Right below-knee amputee [facility]) Active Problems:   AKI (acute kidney injury) ([place])   Severe protein-calorie malnutrition (HCC)   Bradycardia Pain management Acute on chronic anemia Hypertension Diabetes mellitus with peripheral neuropathy Hyperlipidemia Urinary retention with scrotal edema History of tobacco use Insomnia Transaminitis  Discharged Condition: Stable  Significant Diagnostic Studies: DG Chest 2 View  Result Date: 01/04/2021 CLINICAL DATA:  Diabetic foot wound EXAM: CHEST - 2 VIEW COMPARISON:  12/28/2014 FINDINGS: Heart size is upper limits of normal. Mild pulmonary vascular congestion. No focal airspace consolidation, pleural effusion, or pneumothorax. IMPRESSION: Mild pulmonary vascular congestion. No focal airspace consolidation. Electronically Signed   By: Davina Poke D.O.   On: 01/04/2021 18:09   US RENAL  Result Date: 01/05/2021 CLINICAL DATA:  Acute kidney injury EXAM: RENAL / URINARY TRACT ULTRASOUND COMPLETE COMPARISON:  None recent FINDINGS: Right Kidney: Renal measurements: 12.2 x 6.7 x 6.4 cm = volume: 272 mL. There is no hydronephrosis. There is increased cortical echogenicity. The cortex is heterogeneous in appearance. Left Kidney: Renal measurements: 13.7 x 6.1 x 5.7 cm = volume: 248 mL. There is no hydronephrosis. There is increased cortical echogenicity. The cortex is heterogeneous. Bladder: The bladder is decompressed. However, there appears to be some bladder wall thickening. Other: There is a trace amount of free fluid in the upper abdomen. IMPRESSION: 1. No hydronephrosis. 2. Echogenic kidneys bilaterally which can be seen in patients with medical renal disease. 3. Heterogeneous appearance of both kidneys which is a nonspecific finding but can be seen in  patients with pyelonephritis. Correlation with urinalysis is recommended. 4. Bladder wall thickening which may be secondary to underdistention versus cystitis versus chronic outlet obstruction. 5. Trace free fluid in the upper abdomen. Electronically Signed   By: Constance Holster M.D.   On: 01/05/2021 15:17   MR FOOT RIGHT W WO CONTRAST  Result Date: 01/05/2021 CLINICAL DATA:  Chronic heel ulcer. EXAM: MRI OF THE RIGHT FOREFOOT WITHOUT AND WITH CONTRAST TECHNIQUE: Multiplanar, multisequence MR imaging of the right foot was performed before and after the administration of intravenous contrast. CONTRAST:  10 cc Gadavist COMPARISON:  Radiographs, same date. FINDINGS: Surgical changes related to a prior transmetatarsal amputation. There is an open wound involving the plantar aspect of the heel with gas noted in the soft tissues. I do not see a discrete rim enhancing drainable soft tissue abscess. Nonenhancing soft tissue is suggestive of tissue necrosis. Diffuse surrounding cellulitis and myositis. No definite findings for pyomyositis. The plantar fascia is thickened and demonstrates inflammatory changes. No discrete tear/rupture. Abnormal signal intensity and enhancement along the plantar cortex of the calcaneus consistent with early osteomyelitis. Markedly thickened Achilles tendon consistent with chronic tendinopathy. No findings suspicious for septic arthritis. IMPRESSION: 1. Open wound involving the plantar aspect of the heel with gas noted in the soft tissues. Nonenhancing soft tissue is suggestive of tissue necrosis. No discrete rim enhancing drainable soft tissue abscess. 2. Abnormal signal intensity and enhancement along the plantar cortex of the calcaneus consistent with early osteomyelitis. 3. Diffuse cellulitis and myositis. 4. Markedly thickened Achilles tendon consistent with chronic tendinopathy. Electronically Signed   By: Marijo Sanes M.D.   On: 01/05/2021 08:00   DG Chest Port 1 View  Result  Date: 01/24/2021 CLINICAL DATA:  Shortness of breath EXAM: PORTABLE CHEST 1 VIEW COMPARISON:  January 10, 2021 FINDINGS: The cardiomediastinal silhouette is unchanged and mild enlarged in contour. Likely small RIGHT pleural effusion. No pneumothorax. Perihilar vascular prominence. Patchy bibasilar heterogeneous airspace opacities. Diffuse interstitial prominence. Visualized abdomen is unremarkable. Multilevel degenerative changes of the thoracic spine. IMPRESSION: Constellation of findings are favored to reflect pulmonary edema with scattered atelectasis. Infection could present similarly. Electronically Signed   By: Valentino Saxon MD   On: 01/24/2021 14:52   DG CHEST PORT 1 VIEW  Result Date: 01/10/2021 CLINICAL DATA:  65 year old male with shortness of breath EXAM: PORTABLE CHEST 1 VIEW COMPARISON:  01/04/2021, 12/28/2014 FINDINGS: Cardiomediastinal silhouette unchanged in size and contour. No pneumothorax. No pleural effusion. Coarsened interstitial markings similar to the prior plain film. Interlobular septal thickening bilaterally is more pronounced than the plain film of 2016. No new confluent airspace disease. No displaced fracture IMPRESSION: Persisting interlobular septal thickening from the prior plain film, new from the remote comparison 12/28/2014. This is most suggestive of persisting pulmonary edema although atypical infection could have this appearance. Electronically Signed   By: Corrie Mckusick D.O.   On: 01/10/2021 10:59   DG Foot Complete Right  Result Date: 01/06/2021 CLINICAL DATA:  Postop. Incision and drainage with bone biopsy and placement of antibiotic beads. EXAM: RIGHT FOOT COMPLETE - 3+ VIEW COMPARISON:  Radiograph and MRI 01/04/2021 FINDINGS: Interval placement of antibiotic beads subjacent to the calcaneus with overlying skin staples. Patient has prior transmetatarsal amputation with smooth resection margin. IMPRESSION: Interval placement of antibiotic beads subjacent to the  calcaneus. Electronically Signed   By: Keith Rake M.D.   On: 01/06/2021 21:57   DG Foot Complete Right  Result Date: 01/05/2021 Please see detailed radiograph report in office note.  DG Foot Complete Right  Result Date: 01/04/2021 CLINICAL DATA:  Nonhealing foot wound EXAM: RIGHT FOOT COMPLETE - 3+ VIEW COMPARISON:  07/03/2019 FINDINGS: Patient is status post transmetatarsal amputation of the first-fifth rays. Resection margins appear preserved without evidence of a new erosion. There is ulceration plantar aspect of the heel underlying the calcaneal tuberosity. Cortical irregularity of the plantar surface of the posterior calcaneus near the plantar fascia attachment is suspicious for acute osteomyelitis. Air within the soft tissues underlying the site of ulceration suspicious for sinus tract. Vascular calcifications are present. IMPRESSION: 1. Cortical irregularity of the plantar surface of the posterior calcaneus is suspicious for acute osteomyelitis. 2. Plantar foot ulceration with suspected sinus tract extending to the underlying calcaneus. Electronically Signed   By: Davina Poke D.O.   On: 01/04/2021 18:08   ECHOCARDIOGRAM COMPLETE  Result Date: 01/10/2021    ECHOCARDIOGRAM REPORT   Patient Name:   JOSYIAH LEO Date of Exam: 01/10/2021 Medical Rec #:  [id number]     Height:       74.0 in Accession #:    [id number]    Weight:       224.6 lb Date of Birth:  1956/09/10     BSA:          2.284 m Patient Age:    37 years      BP:           152/71 mmHg Patient Gender: M             HR:           63 bpm. Exam Location:  Inpatient Procedure: 2D Echo, Cardiac Doppler and Color Doppler Indications:     Pre-op examination  History:  Patient has no prior history of Echocardiogram examinations.                  Signs/Symptoms:Bacteremia.  Sonographer:     [name] RDCS Referring Phys:  [id number] Barb Merino Diagnosing Phys: Oswaldo Milian MD IMPRESSIONS  1. Left ventricular ejection  fraction, by estimation, is 55 to 60%. The left ventricle has normal function. The left ventricle has no regional wall motion abnormalities. There is mild left ventricular hypertrophy. Left ventricular diastolic parameters are indeterminate.  2. Right ventricular systolic function is normal. The right ventricular size is normal. There is moderately elevated pulmonary artery systolic pressure. The estimated right ventricular systolic pressure is [id number] mmHg.  3. Left atrial size was mildly dilated.  4. Right atrial size was mildly dilated.  5. The mitral valve is normal in structure. No evidence of mitral valve regurgitation.  6. The aortic valve was not well visualized. Aortic valve regurgitation is not visualized. No aortic stenosis is present.  7. The inferior vena cava is dilated in size with <50% respiratory variability, suggesting right atrial pressure of 15 mmHg. FINDINGS  Left Ventricle: Left ventricular ejection fraction, by estimation, is 55 to 60%. The left ventricle has normal function. The left ventricle has no regional wall motion abnormalities. The left ventricular internal cavity size was normal in size. There is  mild left ventricular hypertrophy. Left ventricular diastolic parameters are indeterminate. Right Ventricle: The right ventricular size is normal. No increase in right ventricular wall thickness. Right ventricular systolic function is normal. There is moderately elevated pulmonary artery systolic pressure. The tricuspid regurgitant velocity is 2.87 m/s, and with an assumed right atrial pressure of 15 mmHg, the estimated right ventricular systolic pressure is [id number] mmHg. Left Atrium: Left atrial size was mildly dilated. Right Atrium: Right atrial size was mildly dilated. Pericardium: There is no evidence of pericardial effusion. Mitral Valve: The mitral valve is normal in structure. No evidence of mitral valve regurgitation. Tricuspid Valve: The tricuspid valve is normal in structure. Tricuspid  valve regurgitation is trivial. Aortic Valve: The aortic valve was not well visualized. Aortic valve regurgitation is not visualized. No aortic stenosis is present. Aortic valve mean gradient measures 5.0 mmHg. Aortic valve peak gradient measures 9.9 mmHg. Aortic valve area, by VTI measures 2.98 cm. Pulmonic Valve: The pulmonic valve was not well visualized. Pulmonic valve regurgitation is not visualized. Aorta: The aortic root is normal in size and structure. Venous: The inferior vena cava is dilated in size with less than 50% respiratory variability, suggesting right atrial pressure of 15 mmHg. IAS/Shunts: No atrial level shunt detected by color flow Doppler.  LEFT VENTRICLE PLAX 2D LVIDd:         4.90 cm     Diastology LVIDs:         3.60 cm     LV e' medial:    8.27 cm/s LV PW:         1.20 cm     LV E/e' medial:  16.8 LV IVS:        1.00 cm     LV e' lateral:   9.14 cm/s LVOT diam:     2.20 cm     LV E/e' lateral: 15.2 LV SV:         89 LV SV Index:   39 LVOT Area:     3.80 cm  LV Volumes (MOD) LV vol d, MOD A4C: 96.3 ml LV vol s, MOD A4C: 42.7 ml LV SV MOD A4C:  96.3 ml RIGHT VENTRICLE          IVC RV Basal diam:  4.00 cm  IVC diam: 2.50 cm LEFT ATRIUM             Index       RIGHT ATRIUM           Index LA diam:        4.60 cm 2.01 cm/m  RA Area:     23.40 cm LA Vol (A2C):   71.8 ml 31.44 ml/m RA Volume:   85.10 ml  37.27 ml/m LA Vol (A4C):   87.9 ml 38.49 ml/m LA Biplane Vol: 87.1 ml 38.14 ml/m  AORTIC VALVE AV Area (Vmax):    2.69 cm AV Area (Vmean):   2.88 cm AV Area (VTI):     2.98 cm AV Vmax:           157.00 cm/s AV Vmean:          99.800 cm/s AV VTI:            0.297 m AV Peak Grad:      9.9 mmHg AV Mean Grad:      5.0 mmHg LVOT Vmax:         111.00 cm/s LVOT Vmean:        75.700 cm/s LVOT VTI:          0.233 m LVOT/AV VTI ratio: 0.78  AORTA Ao Root diam: 3.50 cm MITRAL VALVE                TRICUSPID VALVE MV Area (PHT): 5.13 cm     TR Peak grad:   32.9 mmHg MV Decel Time: 148 msec      TR Vmax:        287.00 cm/s MV E velocity: 139.00 cm/s MV A velocity: 78.20 cm/s   SHUNTS MV E/A ratio:  1.78         Systemic VTI:  0.23 m                             Systemic Diam: 2.20 cm Oswaldo Milian MD Electronically signed by Oswaldo Milian MD Signature Date/Time: 01/10/2021/3:12:27 PM    Final (Updated)    US ABDOMEN LIMITED RUQ (LIVER/GB)  Result Date: 01/07/2021 CLINICAL DATA:  Elevated liver enzymes EXAM: ULTRASOUND ABDOMEN LIMITED RIGHT UPPER QUADRANT COMPARISON:  Abdominal ultrasound December 04, 2018 FINDINGS: Gallbladder: Gallbladder is somewhat contracted. Gallbladder wall is borderline thickened without gallbladder wall edema. No gallstones are evident. No pericholecystic fluid. No sonographic Murphy sign noted by sonographer. Common bile duct: Diameter: 3 mm. No intrahepatic or extrahepatic biliary duct dilatation. Liver: No focal lesion identified. Within normal limits in parenchymal echogenicity. Portal vein is patent on color Doppler imaging with normal direction of blood flow towards the liver. Other: Mild ascites noted.  There is a right pleural effusion. IMPRESSION: 1. Gallbladder appears contracted with gallbladder wall borderline thickened. There is ascites which could account for gallbladder wall thickening. No gallstones evident. Note that acalculus cholecystitis potentially could present in this manner. This finding may warrant nuclear medicine hepatobiliary gene study to assess for cystic duct patency. 2.  Mild ascites. 3.  Right pleural effusion noted. Electronically Signed   By: Lowella Grip III M.D.   On: 01/07/2021 19:59    Labs:  Basic Metabolic Panel: Recent Labs  Lab 01/21/21 0253 01/22/21 [id number] 01/23/21 0216 01/24/21 0235 01/25/21 0424 01/26/21 [id number] 01/27/21 [id number]  NA 137 135 135 136 135 135 137  K 5.0 4.7 4.9 5.3* 5.1 4.9 4.6  CL 106 110 111 109 111 112* 112*  CO2 19* 16* 14* 16* 15* 14* 16*  GLUCOSE 112* 107* 88 148* 93 130* 116*  BUN 79*  79* 79* 78* 79* 75* 70*  CREATININE 5.79* 5.85* 5.63* 5.36* 5.30* 5.02* 4.66*  CALCIUM 8.3* 8.1* 8.1* 8.2* 8.3* 8.5* 8.4*  PHOS  --   --   --   --  6.9*  --   --     CBC: Recent Labs  Lab 01/21/21 0253 01/25/21 0424  WBC  --  6.1  HGB 8.3* 7.7*  HCT 23.7* 22.8*  MCV  --  93.8  PLT  --  182    CBG: Recent Labs  Lab 01/26/21 0605 01/26/21 1115 01/26/21 1630 01/26/21 2100 01/27/21 0626  GLUCAP 121* 167* 95 203* 111*   Family history.  Mother with cancer Father with CAD.  Denies any colon cancer esophageal cancer or rectal cancer  Brief HPI:   JARAD KINCHELOE is a 65 y.o. right-handed male with history of chronic pain, diabetes mellitus, hypertension, hyperlipidemia, tobacco abuse peripheral vascular disease with partial amputation first ray right foot 12/05/2018 as well as right great toe amputation at [facility] 09/18/2013.  Per chart review lives with spouse.  1 level home ramped entrance.  Wife works full-time and can work from home if need be.  Patient independent with assistive device prior to admission modified independent ambulating rolling walker.  Presented 01/04/2021 with right plantar ulceration as well as bouts of malaise and nausea with scrotal swelling.  He had initially been seen by podiatry for his right plantar ulceration.  MRI of the right foot showed open wound involving plantar aspect of the heel with gas noted in the soft tissues.  Nonenhancing soft tissue suggestive of tissue necrosis.  No discrete rim-enhancing drainable soft tissue abscess.  Abnormal signal intensity and enhancement along the plantar cortex of the calcaneus consistent with early osteomyelitis.  Admission chemistry sodium 129 potassium 3.4 glucose 239 BUN 56 creatinine 3.94 from baseline 1.0118 2021, WBC 25,300 hemoglobin 9.9 BNP 449 lactic acid 3.0 blood cultures positive Enterobacterales, hemoglobin A1c 10.5.  Infectious disease consulted for positive blood cultures placed on broad-spectrum antibiotics with  follow-up orthopedic service Dr. 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  All current antibiotics had since been discontinued latest WBC 10,300.  Nephrology consulted for AKI suspect ATN renal ultrasound showed no hydronephrosis his lisinopril and HCTZ were discontinued latest creatinine 5.86.  Patient noted significant scrotal edema some urinary retention placed on Flomax and Foley catheter tube had been placed.  Acute on chronic anemia 8.0 and monitored.  Therapy evaluations completed due to patient decreased functional mobility was admitted for a comprehensive rehab program.   Hospital Course: KAELIB REIERSON was admitted to rehab 01/16/2021 for inpatient therapies to consist of PT, ST and OT at least three hours five days a week. Past admission physiatrist, therapy team and rehab RN have worked together to provide customized collaborative inpatient rehab.  Pertaining to patient's right transtibial imitation 01/11/2021 secondary to right calcaneal osteomyelitis wound VAC had since been removed.  He would follow-up orthopedic services.  Wound care as directed.  SCDs for DVT prophylax as well as low-dose aspirin.  Pain management with noted history of opioid addiction currently on Robaxin as needed wean from oxycodone.  Patient remained on Lyrica 50  mg 3 times daily.  Discussed alternative pain options with Dr. Sharol Given such as IV ketamine and nitroglycerin patches ketamine was not initiated due to his renal function and monitored.  Renal function continued to improve latest creatinine 4.66 followed by renal services.  Blood pressure monitored on Norvasc as well as clonidine patch with hydralazine 100 mg twice daily and would need follow-up outpatient monitoring.  Blood sugars hemoglobin A1c 10.5 insulin therapy as directed with diabetic teaching.  Lovaza ongoing for hyperlipidemia.  Noted urinary retention scrotal edema placed on Flomax  scrotal edema continue to improve Foley catheter tube removed no dysuria hematuria.  Noted history of tobacco abuse patient did receive count regards to cessation of nicotine products.  Decreased nutritional storage as wife was bringing in some meals latest albumin 1.5.  Bouts of insomnia felt to be hospital related as well as pain doing well with trazodone.  LFTs somewhat elevated but continued to improve and would need outpatient monitoring.  Patient did have some bouts of bradycardia no chest pain or shortness of breath and follow-up assistance provided by cardiology services.   Blood pressures were monitored on TID basis and soft and monitored  Diabetes has been monitored with ac/hs CBG checks and SSI was use prn for tighter BS control.    Rehab course: During patient's stay in rehab weekly team conferences were held to monitor patient's progress, set goals and discuss barriers to discharge. At admission, patient required moderate assist sit to stand minimal assist ambulate with a rolling walker  Physical exam.  Blood pressure 139/68 pulse 58 temperature 99 respirations 16 oxygen saturation 95% room air Constitutional.  Awake alert no acute distress HEENT Head.  Normocephalic and atraumatic Eyes.  Pupils round and reactive to light no discharge without nystagmus Neck.  Supple nontender no JVD without thyromegaly Cardiac regular rate rhythm no extra sounds or murmur heard Abdomen.  Soft nontender positive bowel sounds without rebound Respiratory effort normal no respiratory distress without wheeze Genitourinary scrotal edema Musculoskeletal normal range of motion no rigidity Comments.  Upper extremities 5/5 biceps triceps wrist extension grip finger abduction bilaterally Left lower extremity hip flexion 4/5 knee extension 4/5 dorsi plantar flexion 5 -/5 Right lower extremity hip flexion 3 -/5 knee extension 3 -/5 Right BKA with knee immobilizer  He/She  has had improvement in activity  tolerance, balance, postural control as well as ability to compensate for deficits. He/She has had improvement in functional use RUE/LUE  and RLE/LLE as well as improvement in awareness.  Patient with progressive gains.  He did require rest breaks for endurance.  Sessions with emphasis on functional ability transfers generalized strengthening standing balance and improved endurance.  Donned limb guard with supervision although removed when standing his limb guard continued to fall off at times.  Transferred supine to sitting edge of bed head of bed elevated with supervision.  Transfers sit to stand rolling walker minimal assist contact-guard.  He propelled his wheelchair supervision.  Scooting transfers contact-guard assist.  Patient preferred having his wife assist with self-care tasks.  Full family teaching completed plan discharge to home       Disposition: Discharged to home    Diet: Diabetic diet 1500 mL fluid restriction  Special Instructions: No driving smoking or alcohol  Medications at discharge 1.  Tylenol as needed 2.  Norvasc 10 mg p.o. daily 3.  Clonidine 0.2 mg p.o. twice daily 4.  Colace 100 mg p.o. twice daily 5.  Hydralazine 100 mg p.o. twice daily 6.  Lantus insulin 20 units daily 7.  Robaxin 500 mg every 6 hours as needed muscle spasms 8.  Lovaza 1 g p.o. daily 9.  Lyrica 50 mg p.o. 3 times daily 10.  Renvela 800 mg p.o. 3 times daily with meals 11.  Sodium bicarbonate 1300 mg p.o. 3 times daily 12.  Flomax 0.4 mg daily after supper 13.  Trazodone 25 to 50 mg p.o. daily  30-35 minutes were spent completing discharge summary and discharge planning    Follow-up Information    Raulkar, Clide Deutscher, MD Follow up.   Specialty: Physical Medicine and Rehabilitation Why: 4/12: Please arrive at 11am for 11:20 appointment Contact information: [id number] [place] [address] [phone]        Newt Minion, MD Follow up.   Specialty: Orthopedic  Surgery Why: Call for appointment Contact information: [place] [address] [phone]        Elmarie Shiley, MD Follow up.   Specialty: Nephrology Why: Call for appointment Contact information: [place] [address] [phone]               Signed: Lavon Paganini [place] 01/27/2021, 8:52 AM

## 2021-01-27 NOTE — Progress Notes (Signed)
Orthopedic Tech Progress Note Patient Details:  Fernando Anthony 09-Jul-1956 [id number] Called in order to [facility] for a BKA SHRINKER Patient ID: REZA GRZESIK, male   DOB: 04-Jun-1956, 65 y.o.   MRN: [id number]   [name] 01/27/2021, 2:33 PM

## 2021-01-27 NOTE — Progress Notes (Signed)
Physical Therapy Discharge Summary  Patient Details  Name: Fernando Anthony MRN: [id number] Date of Birth: [birth year]  Patient has met 4 of 10 long term goals due to improved balance, increased strength and improved coordination. Patient to discharge at a wheelchair level CGA.  Patient's care partner is independent to provide the necessary physical assistance at discharge. Pt's discharge date moved up per pt request and pt's wife attended family education training on 3/9. Pt declined OOB during family education despite encouragement from PT, due to drainage in R residual limb but verbalized confidence with ability to direct care for wife to assist at D/C. Pt and pt's wife with no further questions regarding mobility   Reasons goals not met: Pt did not meet goals due to high pain levels resulting in decreased motivation to participate in OOB mobility during therapy sessions. Pt also insisted D/C date to be moved up due to "not wanting to be in the hospital" despite education that leaving sooner will result in pt requiring a higher level of assist from wife. Pt did not meet mod I transfer goals as pt currently requires CGA for stand<>pivot and lateral scoot transfers. Pt did not meet gait goal and was unable to begin gait training due to high pain levels, decreased motivation, and request to move up D/C date.    Recommendation:  Patient will benefit from ongoing skilled PT services in home health setting to continue to advance safe functional mobility, address ongoing impairments in transfers, generalized strengthening, dynamic standing balance/coordination, amputee education, endurance, and to minimize fall risk.  Equipment: 20x18 manual WC with R amputee support pad  Reasons for discharge: discharge from hospital and pt requesting to move up discharge date  Patient/family agrees with progress made and goals achieved: Yes  PT Discharge Precautions/Restrictions Precautions Precautions:  Fall Required Braces or Orthoses: Other Brace Other Brace: limb guard Restrictions Weight Bearing Restrictions: Yes RLE Weight Bearing: Non weight bearing Cognition Overall Cognitive Status: Within Functional Limits for tasks assessed Arousal/Alertness: Awake/alert Orientation Level: Oriented X4 Memory: Appears intact Awareness: Appears intact Problem Solving: Appears intact Safety/Judgment: Appears intact Sensation Sensation Light Touch: Impaired by gross assessment Proprioception: Impaired by gross assessment Coordination Gross Motor Movements are Fluid and Coordinated: No Fine Motor Movements are Fluid and Coordinated: Yes Coordination and Movement Description: 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, decreased balance/postural control, generalized weakness, pain, and decreased endurance Motor  Motor Motor: Abnormal postural alignment and control Motor - Skilled Clinical Observations: 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, generalized weakness, decreased balance/postural control, and pain  Mobility Bed Mobility Bed Mobility: Rolling Right;Rolling Left;Sit to Supine;Supine to Sit Rolling Right: Independent with assistive device Rolling Left: Independent with assistive device Supine to Sit: Independent with assistive device Sit to Supine: Independent with assistive device Transfers Transfers: Sit to Stand;Stand to Sit;Stand Pivot Transfers;Lateral/Scoot Transfers Sit to Stand: Contact Guard/Touching assist Stand to Sit: Contact Guard/Touching assist Stand Pivot Transfers: Contact Guard/Touching assist Stand Pivot Transfer Details: Verbal cues for technique Stand Pivot Transfer Details (indicate cue type and reason): verbal cues for RW management Lateral/Scoot Transfers: Contact Guard/Touching assist Transfer (Assistive device): Animal nutritionist Mobility: Yes Wheelchair Assistance: Independent with Camera operator:  Both upper extremities Wheelchair Parts Management: Supervision/cueing Distance: 145f  Trunk/Postural Assessment  Cervical Assessment Cervical Assessment: Within Functional Limits Thoracic Assessment Thoracic Assessment: Within Functional Limits Lumbar Assessment Lumbar Assessment: [facility]) Postural Control Postural Control: Deficits on evaluation  Balance Balance Balance  Assessed: Yes Static Sitting Balance Static Sitting - Balance Support: Feet supported;Bilateral upper extremity supported Static Sitting - Level of Assistance: 7: Independent Dynamic Sitting Balance Dynamic Sitting - Balance Support: Feet supported;No upper extremity supported Dynamic Sitting - Level of Assistance: 6: Modified independent (Device/Increase time) (increased time) Static Standing Balance Static Standing - Balance Support: Bilateral upper extremity supported (RW) Static Standing - Level of Assistance: 5: Stand by assistance (CGA) Dynamic Standing Balance Dynamic Standing - Balance Support: Bilateral upper extremity supported (RW) Dynamic Standing - Level of Assistance: 5: Stand by assistance (CGA) Extremity Assessment  RLE Assessment RLE Assessment: Exceptions to [facility] Strength Comments: not formally tested but grossly generalized to 3+/5 LLE Assessment LLE Assessment: Exceptions to [facility] Strength Comments: not formally tested, but grossly generalized to 4/5  Lilliane Sposito M Seamus Warehime Kathy Wares PT, DPT  01/27/2021, 12:03 PM

## 2021-01-28 LAB — COMPREHENSIVE METABOLIC PANEL
ALT: 81 U/L — ABNORMAL HIGH (ref 0–44)
AST: 36 U/L (ref 15–41)
Albumin: 2.1 g/dL — ABNORMAL LOW (ref 3.5–5.0)
Alkaline Phosphatase: 474 U/L — ABNORMAL HIGH (ref 38–126)
Anion gap: 7 (ref 5–15)
BUN: 67 mg/dL — ABNORMAL HIGH (ref 8–23)
CO2: 18 mmol/L — ABNORMAL LOW (ref 22–32)
Calcium: 8.1 mg/dL — ABNORMAL LOW (ref 8.9–10.3)
Chloride: 113 mmol/L — ABNORMAL HIGH (ref 98–111)
Creatinine, Ser: 4.49 mg/dL — ABNORMAL HIGH (ref 0.61–1.24)
GFR, Estimated: 14 mL/min — ABNORMAL LOW (ref >60)
Glucose, Bld: 105 mg/dL — ABNORMAL HIGH (ref 70–99)
Potassium: 4.8 mmol/L (ref 3.5–5.1)
Sodium: 138 mmol/L (ref 135–145)
Total Bilirubin: 2.3 mg/dL — ABNORMAL HIGH (ref 0.3–1.2)
Total Protein: 5.9 g/dL — ABNORMAL LOW (ref 6.5–8.1)

## 2021-01-28 LAB — GLUCOSE, CAPILLARY: Glucose-Capillary: 87 mg/dL (ref 70–99)

## 2021-01-28 NOTE — Progress Notes (Signed)
Inpatient Rehabilitation Care Coordinator Discharge Note  The overall goal for the admission was met for:   Discharge location: Yes-HOME WITH WIFE WHO IS WORKING FROM HOME TO PROVIDE ASSIST  Length of Stay: NO-PT REQUESTED TO DC EARLIER-12 DAYS  Discharge activity level: NO-CGA LEVEL  Home/community participation: Yes  Services provided included: MD, RD, PT, OT, RN, CM, Pharmacy, Neuropsych and SW  Financial Services: Private Insurance: BCBS  Choices offered to/list presented to:YES  Follow-up services arranged: Home Health: [facility]-PT & RN, DME: [facility] and Patient/Family request agency HH: [facility], DME: NO PREF  Comments (or additional information):WIFE WAS HERE FOR EDUCATION AND WAS DOING MOST OF PT'S CARE WHILE HERE, WOULD NOT LET OT DO B & D DUE TO SHE DID  Patient/Family verbalized understanding of follow-up arrangements: Yes  Individual responsible for coordination of the follow-up plan: TERI-WIFE [phone]  Confirmed correct DME delivered: Dupree, Rebecca G 01/28/2021    Dupree, Rebecca G 

## 2021-01-28 NOTE — Progress Notes (Signed)
PROGRESS NOTE   Subjective/Complaints: Cr continuing to trend down to 4.49- wife will schedule f/u with PCP soon for recheck Hypoglycemic last night- mealtime Novolog d/ced- communicated with Linna Hoff.  BP continues to be elevated, pulse low-monitor in log daily outpatient and bring log to f/u appointment   ROS:  Pt denies SOB when laying back but has it when he sits up, abd pain, CP, N/V/D   Objective:   No results found. No results for input(s): WBC, HGB, HCT, PLT in the last 72 hours. Recent Labs    01/27/21 0529 01/28/21 0033  NA 137 138  K 4.6 4.8  CL 112* 113*  CO2 16* 18*  GLUCOSE 116* 105*  BUN 70* 67*  CREATININE 4.66* 4.49*  CALCIUM 8.4* 8.1*    Intake/Output Summary (Last 24 hours) at 01/28/2021 0823 Last data filed at 01/27/2021 1900 Gross per 24 hour  Intake 480 ml  Output -  Net 480 ml        Physical Exam: Vital Signs Blood pressure (!) 161/81, pulse (!) 59, temperature 98.5 F (36.9 C), temperature source Oral, resp. rate 18, height '6\' 2"'$  (1.88 m), weight 120.1 kg, SpO2 94 %. Gen: no distress, normal appearing HEENT: oral mucosa pink and moist, NCAT Cardio: Bradycardic Chest: normal effort, normal rate of breathing Abd: soft, non-distended Ext: no edema Psych: pleasant, normal affect Skin: residual limb with staples removed, healing well, slight oozing with dressing applied.  Musculoskeletal:     Cervical back: Normal range of motion. No rigidity.     Comments: UEs 5/5 in biceps, triceps, WE, grip and finger abd B/L LLE- HF 4/5, KE, 4/5, DF and PF 5-/5 RLE- HF 4/5, KE 4/5  Assessment/Plan: 1. Functional deficits which require 3+ hours per day of interdisciplinary therapy in a comprehensive inpatient rehab setting.  Physiatrist is providing close team supervision and 24 hour management of active medical problems listed below.  Physiatrist and rehab team continue to assess barriers to  discharge/monitor patient progress toward functional and medical goals  Care Tool:  Bathing    Body parts bathed by patient: Right arm,Left arm,Chest,Abdomen,Right upper leg,Left upper leg,Face   Body parts bathed by helper: Left lower leg,Buttocks,Front perineal area Body parts n/a: Right lower leg   Bathing assist Assist Level: Maximal Assistance - Patient 24 - 49%     Upper Body Dressing/Undressing Upper body dressing   What is the patient wearing?: Pull over shirt    Upper body assist Assist Level: Moderate Assistance - Patient 50 - 74%    Lower Body Dressing/Undressing Lower body dressing      What is the patient wearing?: Incontinence brief,Pants     Lower body assist Assist for lower body dressing: Maximal Assistance - Patient 25 - 49%     Toileting Toileting Toileting Activity did not occur (Clothing management and hygiene only): N/A (no void or bm)  Toileting assist Assist for toileting: Independent with assistive device     Transfers Chair/bed transfer  Transfers assist     Chair/bed transfer assist level: Contact Guard/Touching assist     Locomotion Ambulation   Ambulation assist   Ambulation activity did not occur: Safety/medical concerns (R  BKA, fatigue, pain, decreased balance, poor endurance, weakness)          Walk 10 feet activity   Assist  Walk 10 feet activity did not occur: Safety/medical concerns (R BKA, fatigue, pain, decreased balance, poor endurance, weakness)        Walk 50 feet activity   Assist Walk 50 feet with 2 turns activity did not occur: Safety/medical concerns (R BKA, fatigue, pain, decreased balance, poor endurance, weakness)         Walk 150 feet activity   Assist Walk 150 feet activity did not occur: Safety/medical concerns (R BKA, fatigue, pain, decreased balance, poor endurance, weakness)         Walk 10 feet on uneven surface  activity   Assist Walk 10 feet on uneven surfaces activity did  not occur: Safety/medical concerns (R BKA, fatigue, pain, decreased balance, poor endurance, weakness)         Wheelchair     Assist Will patient use wheelchair at discharge?: Yes Type of Wheelchair: Manual    Wheelchair assist level: Independent Max wheelchair distance: 11f    Wheelchair 50 feet with 2 turns activity    Assist        Assist Level: Independent   Wheelchair 150 feet activity     Assist      Assist Level: Independent   Blood pressure (!) 161/81, pulse (!) 59, temperature 98.5 F (36.9 C), temperature source Oral, resp. rate 18, height '6\' 2"'$  (1.88 m), weight 120.1 kg, SpO2 94 %.  Medical Problem List and Plan: 1.  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  Wound VAC as directed             -patient may shower with incision covered.   -DC home today 2.  Antithrombotics: -DVT/anticoagulation: SCDs left lower extremity             -antiplatelet therapy: Continue Aspirin 81 mg daily 3. Pain Management: Lyrica 50 mg 3 times daily, Robaxin and oxycodone as needed- add Duloxetine 20 mg QHS for nerve pain  2/27- no side effects so far- con't regimen  2/28: waking at night for pain medication. Add Oxycontin '10mg'$  BID, discussed how this works and using less PRN medication.   3/1: shrinker is painful for patient- discussed with Sarah OT applying ace wrap for now and I have ordered a new shrinker.   3/2: Stop log-acting oxycodone given that it did not help significantly and because patient has history of opioid addiction  3/3: Please try TENS and mirror therapy- will message therapy. Will discuss with Dr. DJess Bartersteam nerve block and nitroglycerin patch as options.   3/4: will discuss plan for opioid taper with patient: 3/4: oxycodone '10mg'$  4H prn, 3/7: oxycodone '5mg'$  q4H prn, 3/9: oxycodone '5mg'$  q6H prn, 3/12: oxycodone '5mg'$  q8H prn, 3/15: oxycodone '5mg'$  q12H prn, 3/16: oxycodone  '5mg'$  daily prn, 3/17: discharge- no opioids upon discharge.  3/7: discussed alternative pain options with Dr. DSharol Given such as IV Ketamine and Nitroglycerin patches. Consulted pharmacy and they did not recommend IV Ketamine given patient's Cr. Will discuss Nitrolgycerin patch today.   3/8: pain is better controlled. Wean oxycodone to '5mg'$  q4H prn today  3/9: d/c oxycodone 4. Anxiety: Provide emotional support. Appreciate neuropsych eval, note reviewed, continue outpatient.              -antipsychotic agents: N/A 5. Neuropsych: This patient is capable of making decisions on his own behalf.  6. Skin/Wound Care: Routine skin checks. May d/c wound vac 2/28 7. Fluids/Electrolytes/Nutrition: Routine in and outs with follow-up chemistries 8.  Acute on chronic anemia.  Continue Niferex.    3/3: Hgb reviewed, 8.3, repeat Monday  9.  AKI/ATN.  Follow-up nephrology services.  No current plan for hemodialysis- Current Cr 5.76  3/10: discussed etiologies of his ATN: Cr down to 4.49 appreciate nephrology following, note reviewed. Repeat CMP outpatient. 10.  Hypertension.  Hydralazine 50 mg twice daily, Norvasc 10 mg daily, clonidine 0.1 mg twice daily, Tenormin 50 mg daily.  Monitor with increased mobility  3/4: systolic BP elevated on last 5 reads: increase clonidine to .'2mg'$  (can also help with pain)  3/9: BP remains elevated but bradycardic: would benefit from outpatient cardiology follow-up, will refer.  11.  Diabetes mellitus with peripheral neuropathy.  Hemoglobin A1c 10.5.  NovoLog 4 units 3 times daily, Lantus insulin 35 units nightly.  Check blood sugars before meals and at bedtime  3/10: hypoglycemic: d/c mealtime Novolog.  12.  Hyperlipidemia.  Lovaza 13.  Urinary retention with scrotal edema.  Flomax 0.4 mg daily.  Scrotal edema improving, d/c foley 14.  History of tobacco abuse.  Counseling 15. Protein malnutrition- severe- Alb 1.5- cause of edema likely 16. LE edema- severe- don't see Diuretic- likely  due to Renal issues- elevating scrotum- con't regimen  2/27- see #13  3/4: improving 17. Insomnia- try Trazodone 25-50 mg QHS for sleep   2/27- working- con't regimen 18. Transaminitis: stable, continue tylenol '650mg'$  TID with daily LFT check.  3/7: LFTs reviewed and are improving 19. Bradycardia:now symptomatic , reduce atenolol, per nephro will get cardiology to eval as well  continue to monitor TID  20.  Shortness of breath worsened with sitting forward.  He does have anasarca and certainly would expect some pleural effusions.  Nephrology is aggressively diuresing.  In addition he does have bradycardia.  As per nephrology recommendations will ask for cardiology assistance. We will check EKG, chest x-ray, reviewed labs from this morning no concerns.  3/7: labs improving! CXR shows pulmonary edema. Diuresis per nephrology.  21. Disposition: d/c home today   >30 minutes spent in discharge of patient including review of medications and follow-up appointments, physical examination, and in answering all patient's questions   LOS: 12 days A FACE TO FACE EVALUATION WAS [place] 01/28/2021, 8:23 AM

## 2021-01-28 NOTE — Progress Notes (Signed)
RN removed last 2 staples.  Patient had no c/o pain.  Incision still draining a small amount of sanguineous fluid.  RN applied a non-adherent pad and wrapped with kerlix.

## 2021-01-28 NOTE — Progress Notes (Signed)
Hypoglycemic Event  CBG: 67  Treatment: 8 oz juice/soda  Symptoms: None  Follow-up CBG: Time:2105 CBG Result:86  Possible Reasons for Event: Inadequate meal intake  Comments/MD notified: Charge nurse notified. Pt stable at this time. RN will continue to monitor.    Buck Mam

## 2021-01-28 NOTE — Progress Notes (Signed)
Patient discharged off of unit with all belongings.  Discharge instructions given to patient and spouse by physician assistant.  Patient and family have no further questions at this time.

## 2021-01-28 NOTE — Progress Notes (Signed)
Patient ID: Fernando Anthony, male   DOB: Jul 04, 1956, 65 y.o.   MRN: [id number] S: Patient states he feels well today.  Planning to discharge O:BP (!) 161/81 (BP Location: Left Arm)   Pulse (!) 59   Temp 98.5 F (36.9 C) (Oral)   Resp 18   Ht '6\' 2"'$  (1.88 m)   Wt 120.1 kg   SpO2 94%   BMI 33.99 kg/m   Intake/Output Summary (Last 24 hours) at 01/28/2021 1311 Last data filed at 01/28/2021 0900 Gross per 24 hour  Intake 480 ml  Output --  Net 480 ml   Intake/Output: I/O last 3 completed shifts: In: 720 [P.O.:720] Out: -   Intake/Output this shift:  Total I/O In: 240 [P.O.:240] Out: -  Weight change:  Gen: NAD CVS: Bradycardic Resp: Bilateral chest rise, no increased work of breathing Abd: +BS, soft, NT/ND Ext: s/p RBKA, edema present in left lower extremity  Recent Labs  Lab 01/22/21 0608 01/23/21 0216 01/24/21 0235 01/25/21 0424 01/26/21 0458 01/27/21 0529 01/28/21 0033  NA 135 135 136 135 135 137 138  K 4.7 4.9 5.3* 5.1 4.9 4.6 4.8  CL 110 111 109 111 112* 112* 113*  CO2 16* 14* 16* 15* 14* 16* 18*  GLUCOSE 107* 88 148* 93 130* 116* 105*  BUN 79* 79* 78* 79* 75* 70* 67*  CREATININE 5.85* 5.63* 5.36* 5.30* 5.02* 4.66* 4.49*  ALBUMIN 1.7* 1.7* 1.8* 1.9* 2.3* 2.1* 2.1*  CALCIUM 8.1* 8.1* 8.2* 8.3* 8.5* 8.4* 8.1*  PHOS  --   --   --  6.9*  --   --   --   AST 77* 73* 60* 54* 49* 38 36  ALT 110* 112* 106* 101* 105* 91* 81*   Liver Function Tests: Recent Labs  Lab 01/26/21 0458 01/27/21 0529 01/28/21 0033  AST 49* 38 36  ALT 105* 91* 81*  ALKPHOS 548* 467* 474*  BILITOT 3.4* 2.6* 2.3*  PROT 6.9 6.3* 5.9*  ALBUMIN 2.3* 2.1* 2.1*   No results for input(s): LIPASE, AMYLASE in the last 168 hours. No results for input(s): AMMONIA in the last 168 hours. CBC: Recent Labs  Lab 01/25/21 0424  WBC 6.1  HGB 7.7*  HCT 22.8*  MCV 93.8  PLT 182   Cardiac Enzymes: No results for input(s): CKTOTAL, CKMB, CKMBINDEX, TROPONINI in the last 168 hours. CBG: Recent Labs   Lab 01/27/21 1148 01/27/21 1634 01/27/21 2047 01/27/21 2105 01/28/21 0628  GLUCAP 155* 104* 67* 86 87    Iron Studies:  No results for input(s): IRON, TIBC, TRANSFERRIN, FERRITIN in the last 72 hours. Studies/Results: No results found. Marland Kitchen acetaminophen  650 mg Oral Q8H  . amLODipine  10 mg Oral Daily  . aspirin EC  81 mg Oral QHS  . cloNIDine  0.2 mg Oral BID  . darbepoetin (ARANESP) injection - NON-DIALYSIS  40 mcg Subcutaneous Q Wed-1800  . docusate sodium  100 mg Oral BID  . hydrALAZINE  100 mg Oral BID  . insulin glargine  20 Units Subcutaneous Daily  . omega-3 acid ethyl esters  1 g Oral Daily  . pregabalin  50 mg Oral TID  . sevelamer carbonate  800 mg Oral TID WC  . sodium bicarbonate  1,300 mg Oral TID  . tamsulosin  0.4 mg Oral QPC supper  . traZODone  25-50 mg Oral QHS    BMET    Component Value Date/Time   NA 138 01/28/2021 0033   K 4.8 01/28/2021 0033  CL 113 (H) 01/28/2021 0033   CO2 18 (L) 01/28/2021 0033   GLUCOSE 105 (H) 01/28/2021 0033   BUN 67 (H) 01/28/2021 0033   CREATININE 4.49 (H) 01/28/2021 0033   CALCIUM 8.1 (L) 01/28/2021 0033   GFRNONAA 14 (L) 01/28/2021 0033   GFRAA >60 11/29/2019 2137   CBC    Component Value Date/Time   WBC 6.1 01/25/2021 0424   RBC 2.43 (L) 01/25/2021 0424   HGB 7.7 (L) 01/25/2021 0424   HCT 22.8 (L) 01/25/2021 0424   PLT 182 01/25/2021 0424   MCV 93.8 01/25/2021 0424   MCH 31.7 01/25/2021 0424   MCHC 33.8 01/25/2021 0424   RDW 21.9 (H) 01/25/2021 0424   LYMPHSABS 1.1 01/18/2021 0209   MONOABS 1.0 01/18/2021 0209   EOSABS 0.2 01/18/2021 0209   BASOSABS 0.1 01/18/2021 0209    Assessment/Plan:  1. AKI/CKD stage IIIa: Creatinine as low as 1.1 in 2021 and was 4 on arrival.  Creatinine peaked at 6 and has been very slow to improve.  Serological work-up nonrevealing.  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 Creatinine  continues to downtrend.  Patient planning to discharge today.  I will arrange follow-up in clinic in 7 to 10 days 2. Bradycardia -cardiology following.  Thought to be related to decreased clearance of atenolol given his significant renal insufficiency.  Continue to monitor.  Asymptomatic today 3. SOB -improved today.  No oxygen 4. 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 Per orthopedics.  5. [id number] held.  Blood pressure elevated at this time currently taking clonidine 0.2 twice daily, hydralazine 50 mg twice daily, amlodipine 10 mg daily.  Hopefully will improve with improving volume status.  Blood pressure does seem to be improving 6. Anemia- Secondary to chronic illness. Tsat low, holding IV Fe in the setting of active infection. AKI contributing as well. aranesp 40 mcg once on 2/23. On oral iron. PRBC's on 2/26 given. Reorder aranesp to be given on Wed given falling hgb.  Iron stores remain depleted.  May need second dose of Feraheme outpatient 7. Metabolic acidosis: Likely associated with AKI.  On supplemental bicarb.  Improving 8. hyperphosphatemia - renal diet.  Continue Renvela 9. DM - Insulin per primary team.  10. Severe protein malnutrition - per primary 11. Disposition - cont with PT/OT 12. Abnormal LFT's -hepatitis panel negative and right upper quadrant okay.  Continue to monitor

## 2021-01-28 NOTE — Progress Notes (Signed)
Vital signs reviewed.  Heart rates have remained stable in the 50s.  [facility] will sign off.  Please call if there are any additional questions.   Alhassan Everingham C. Oval Linsey, MD, [facility] 01/28/2021 10:37 AM

## 2021-01-29 ENCOUNTER — Telehealth: Payer: Self-pay | Admitting: Orthopedic Surgery

## 2021-01-29 ENCOUNTER — Other Ambulatory Visit: Payer: Self-pay | Admitting: Orthopedic Surgery

## 2021-01-29 MED ORDER — OXYCODONE-ACETAMINOPHEN 5-325 MG PO TABS: 1.0000 | ORAL_TABLET | ORAL | 0 refills | Status: DC | PRN

## 2021-01-29 NOTE — Telephone Encounter (Signed)
I called and sw the pt's wife and she states that pt was not sent home with any pain medication he is s/p a BKA 01/12/21 he is taking tulenol but states he has had increased pain since d/c from the hospital and also advised pt's wife to wash daily with dial soap and water to apply dry dressing and shrinker and made and appt for Monday to come in for eval

## 2021-01-29 NOTE — Telephone Encounter (Signed)
Patient's wife Con Memos asking for a return call. She states patient had staples removed and he is bleeding. Patient phone number is [phone].

## 2021-01-29 NOTE — Telephone Encounter (Signed)
I called and lm on vm to advise to call back. Pt is s/p a BKA on 01/11/21 was in CIR. Not sure when staples were removed or what dressing instruction pt was given will hold this message and await return call.

## 2021-01-29 NOTE — Telephone Encounter (Signed)
Called and sw pt's wife to advise.

## 2021-01-29 NOTE — Telephone Encounter (Signed)
Rx sent to [facility]  

## 2021-01-29 NOTE — Telephone Encounter (Signed)
Patients wife called she is returning your call I advised patient I wasn't able to reach you and will let you know she called .

## 2021-02-01 ENCOUNTER — Other Ambulatory Visit: Payer: Self-pay

## 2021-02-01 ENCOUNTER — Telehealth: Payer: Self-pay | Admitting: *Deleted

## 2021-02-01 ENCOUNTER — Ambulatory Visit (INDEPENDENT_AMBULATORY_CARE_PROVIDER_SITE_OTHER): Payer: BC Managed Care – PPO | Admitting: Orthopedic Surgery

## 2021-02-01 ENCOUNTER — Encounter: Payer: Self-pay | Admitting: Orthopedic Surgery

## 2021-02-01 DIAGNOSIS — Z89511 Acquired absence of right leg below knee: Secondary | ICD-10-CM

## 2021-02-01 DIAGNOSIS — S88111A Complete traumatic amputation at level between knee and ankle, right lower leg, initial encounter: Secondary | ICD-10-CM

## 2021-02-01 NOTE — Telephone Encounter (Signed)
Richardson Landry PT [facility] called for POC 2wk3, 1wk4.  Approval given.

## 2021-02-01 NOTE — Telephone Encounter (Signed)
Christy from [facility] called for SN POC 1wk1,2wk3.  Approval given.

## 2021-02-01 NOTE — Progress Notes (Signed)
Office Visit Note   Patient: Fernando Anthony           Date of Birth: [birth year]           MRN: [id number] Visit Date: 02/01/2021              Requested by: Curly Rim, MD [facility],  [place] [id number] PCP: Curly Rim, MD  Chief Complaint  Patient presents with  . Right Leg - Routine Post Op    01/11/21 right BKA CIR d/c 01/28/21 removed staples that day.       HPI: Patient is a 65 year old gentleman who presents 3 weeks status post right transtibial amputation he is wearing a 4 extra-large stump shrinker.  Patient states he has had a small area of wound opening up in the medial aspect of the incision.  Assessment & Plan: Visit Diagnoses:  1. Below-knee amputation of right lower extremity ([place])     Plan: Recommended that he change to the 3 extra-large stump shrinker wear this around-the-clock directly against the wound wash with soap and water.  Discussed that we will need to cut out the suture knots medially and laterally.  Follow-Up Instructions: Return in about 1 week (around 02/08/2021).   Ortho Exam  Patient is alert, oriented, no adenopathy, well-dressed, normal affect, normal respiratory effort. Examination patient has a well approximated well-healed surgical incision.  Medially there is a small area of wound dehiscence that is 5 mm in diameter 5 mm deep there is an exposed absorbable Vicryl suture this will need to be removed.  There is no cellulitis no odor no drainage no signs of infection.  Imaging: No results found. No images are attached to the encounter.  Labs: Lab Results  Component Value Date   HGBA1C 10.5 (H) 01/04/2021   ESRSEDRATE 129 (H) 01/04/2021   CRP 19.3 (H) 01/04/2021   LABURIC 7.8 01/07/2021   REPTSTATUS 01/16/2021 FINAL 01/11/2021   REPTSTATUS 01/16/2021 FINAL 01/11/2021   GRAMSTAIN NO WBC SEEN RARE GRAM POSITIVE COCCI  01/06/2021   CULT  01/11/2021    NO GROWTH 5 DAYS Performed at [facility],  [place] [address], [place], [place] [address]    CULT  01/11/2021    NO GROWTH 5 DAYS Performed at [facility] [address], [place], [place] [address]    LABORGA CITROBACTER KOSERI 01/04/2021     Lab Results  Component Value Date   ALBUMIN 2.1 (L) 01/28/2021   ALBUMIN 2.1 (L) 01/27/2021   ALBUMIN 2.3 (L) 01/26/2021   LABURIC 7.8 01/07/2021    Lab Results  Component Value Date   MG 1.8 01/13/2021   MG 1.6 (L) 01/11/2021   MG 1.7 01/10/2021   No results found for: VD25OH  No results found for: PREALBUMIN CBC EXTENDED Latest Ref Rng & Units 01/25/2021 01/21/2021 01/19/2021  WBC 4.0 - 10.5 K/uL 6.1 - 9.2  RBC 4.22 - 5.81 MIL/uL 2.43(L) - 2.69(L)  HGB 13.0 - 17.0 g/dL 7.7(L) 8.3(L) 8.3(L)  HCT 39.0 - 52.0 % 22.8(L) 23.7(L) 24.4(L)  PLT 150 - 400 K/uL 182 - 232  NEUTROABS 1.7 - 7.7 K/uL - - -  LYMPHSABS 0.7 - 4.0 K/uL - - -     There is no height or weight on file to calculate BMI.  Orders:  No orders of the defined types were placed in this encounter.  No orders of the defined types were placed in this encounter.  Procedures: No procedures performed  Clinical Data: No additional findings.  ROS:  All other systems negative, except as noted in the HPI. Review of Systems  Objective: Vital Signs: There were no vitals taken for this visit.  Specialty Comments:  No specialty comments available.  PMFS History: Patient Active Problem List   Diagnosis Date Noted  . Bradycardia   . Right below-knee amputee ([place]) 01/16/2021  . Acute osteomyelitis of right calcaneus ([place])   . Severe protein-calorie malnutrition ([place])   . AKI (acute kidney injury) ([place]) 01/04/2021  . Anemia 01/04/2021  . History of transmetatarsal amputation of right foot ([place]) 06/11/2019  . Neuropathic pain of foot, right 06/11/2019  . Memory difficulties 01/18/2019  . Psychophysiological insomnia 01/18/2019  . Hyperlipidemia associated with type 2 diabetes mellitus ([place]) 12/13/2018  .  Hypertension associated with type 2 diabetes mellitus ([place]) 12/13/2018  . Partial nontraumatic amputation of right foot ([place]) 12/13/2018  . Osteomyelitis of right foot ([place]) 12/04/2018  . Hypertensive urgency 12/04/2018  . Abnormal LFTs 12/04/2018  . Hyponatremia 12/04/2018  . Osteomyelitis ([place]) 12/04/2018  . Hypertension, essential, benign 08/20/2017  . Hypokalemia 08/20/2017  . 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 Lumbar post-laminectomy syndrome 09/16/2014  . Diabetic neuropathy ([place]) 04/04/2013  . Postlaminectomy syndrome, lumbar region 05/18/2012  . Uncontrolled diabetes mellitus with diabetic nephropathy ([place]) 05/18/2012   Past Medical History:  Diagnosis Date  . Back pain   . Diabetes mellitus   . Headache(784.0)    general  . Hyperlipidemia   . Hypertension   . Osteomyelitis ([place]) 11/2018   RIGHT FOOT    Family History  Problem Relation Age of Onset  . Cancer Mother   . Heart disease Father     Past Surgical History:  Procedure Laterality Date  . AMPUTATION Right 09/18/2013   Procedure: Amputation Right Great Toe at MTP;  Surgeon: Newt Minion, MD;  Location: [facility];  Service: Orthopedics;  Laterality: Right;  Amputation Right Great Toe at MTP  . AMPUTATION Right 12/05/2018   Procedure: PARTIAL AMPUTATION FIRST RAY RIGHT FOOT;  Surgeon: Edrick Kins, DPM;  Location: [facility];  Service: Podiatry;  Laterality: Right;  . AMPUTATION Right 01/11/2021   Procedure: AMPUTATION BELOW KNEE;  Surgeon: Newt Minion, MD;  Location: [facility];  Service: Orthopedics;  Laterality: Right;  . APPLICATION OF WOUND VAC Right 01/11/2021   Procedure: APPLICATION OF WOUND VAC;  Surgeon: Newt Minion, MD;  Location: [facility];  Service: Orthopedics;  Laterality: Right;  . BONE BIOPSY Right 01/06/2021   Procedure: BONE BIOPSY;  Surgeon: Edrick Kins, DPM;  [facility];  Service: Podiatry;   Laterality: Right;  . CARDIAC CATHETERIZATION  ?1990  . INCISION AND DRAINAGE Right 01/06/2021   Procedure: INCISION AND DRAINAGE, ANTIBIOTIC BEAD PLACEMENT;  Surgeon: Edrick Kins, DPM;  [facility];  Service: Podiatry;  Laterality: Right;  . SPINE SURGERY     Social History   Occupational History  . Not on file  Tobacco Use  . Smoking status: Current Every Day Smoker    Packs/day: 1.00    Years: 40.00    Pack years: 40.00    Types: Cigarettes  . Smokeless tobacco: Never Used  Vaping Use  . Vaping Use: Former  Substance and Sexual Activity  . Alcohol use: Not Currently  . Drug use: Not Currently    Comment: past use of marijuana  . Sexual  activity: Not on file

## 2021-02-04 ENCOUNTER — Inpatient Hospital Stay: Payer: BC Managed Care – PPO | Admitting: Orthopedic Surgery

## 2021-02-04 ENCOUNTER — Other Ambulatory Visit: Payer: Self-pay | Admitting: Physician Assistant

## 2021-02-04 ENCOUNTER — Telehealth: Payer: Self-pay | Admitting: Orthopedic Surgery

## 2021-02-04 MED ORDER — OXYCODONE-ACETAMINOPHEN 5-325 MG PO TABS: 1.0000 | ORAL_TABLET | ORAL | 0 refills | Status: DC | PRN

## 2021-02-04 NOTE — Telephone Encounter (Signed)
Patient called. He would like a refill on oxycodone. His call back number is [phone]

## 2021-02-04 NOTE — Telephone Encounter (Signed)
Called pt Fernando Anthony on vm to advise that rx has been sent to pharm.

## 2021-02-04 NOTE — Telephone Encounter (Signed)
Pt is s/p a right BKA 01/11/21 requesting refill on oxycodone 5/325 last refill 01/29/21 #30 please advise.

## 2021-02-04 NOTE — Telephone Encounter (Signed)
done

## 2021-02-08 ENCOUNTER — Ambulatory Visit: Payer: BC Managed Care – PPO | Admitting: Orthopedic Surgery

## 2021-02-08 ENCOUNTER — Encounter: Payer: Self-pay | Admitting: Orthopedic Surgery

## 2021-02-08 ENCOUNTER — Ambulatory Visit (INDEPENDENT_AMBULATORY_CARE_PROVIDER_SITE_OTHER): Payer: BC Managed Care – PPO | Admitting: Physician Assistant

## 2021-02-08 DIAGNOSIS — Z89511 Acquired absence of right leg below knee: Secondary | ICD-10-CM

## 2021-02-08 DIAGNOSIS — S88111A Complete traumatic amputation at level between knee and ankle, right lower leg, initial encounter: Secondary | ICD-10-CM

## 2021-02-08 NOTE — Progress Notes (Signed)
Office Visit Note   Patient: Fernando Anthony           Date of Birth: 1956-11-05           MRN: [id number] Visit Date: 02/08/2021              Requested by: Curly Rim, MD [place],  [place] [id number] PCP: Curly Rim, MD  Chief Complaint  Patient presents with  . Right Leg - Routine Post Op    01/11/21 right BKA       HPI: Patient is a pleasant 65 year old gentleman who is 1 month status post right below-knee amputation.  He is wearing his shrinker.  On the medial side of the wound he has intermittent bloody drainage.  He thinks when he was in the hospital this was where a staple was inadvertently pulled out.  He is having a lot of problems with edema and is scheduled to meet with his nephrologist  Assessment & Plan: Visit Diagnoses: No diagnosis found.  Plan: Encouraged continued use of the shrinker.  They should wash daily with Dial soap and water.  I am hopeful that this will continue to heal and it was secondary to hematoma which I expressed quite a bit up today.  We will continue to follow weekly .  Told him the worst case scenario would be a revision but we will try and see if we can help this to heal Follow-Up Instructions: No follow-ups on file.   Ortho Exam  Patient is alert, oriented, no adenopathy, well-dressed, normal affect, normal respiratory effort. Below-knee amputation stump overall well-healed incision.  No ascending cellulitis no erythema the medial 2 cm of the wound does have wound dehiscence that does probe deeply.  I can express dark blood but no purulent drainage no foul odor he has minimal to no tenderness when I do this.  No ascending cellulitis  Imaging: No results found. No images are attached to the encounter.  Labs: Lab Results  Component Value Date   HGBA1C 10.5 (H) 01/04/2021   ESRSEDRATE 129 (H) 01/04/2021   CRP 19.3 (H) 01/04/2021   LABURIC 7.8 01/07/2021   REPTSTATUS 01/16/2021 FINAL 01/11/2021   REPTSTATUS  01/16/2021 FINAL 01/11/2021   GRAMSTAIN NO WBC SEEN RARE GRAM POSITIVE COCCI  01/06/2021   CULT  01/11/2021    NO GROWTH 5 DAYS Performed at [facility], [place] [address] [place], [place] [address]    CULT  01/11/2021    NO GROWTH 5 DAYS Performed at [facility] [address] [place], [place] [address]    LABORGA CITROBACTER KOSERI 01/04/2021     Lab Results  Component Value Date   ALBUMIN 2.1 (L) 01/28/2021   ALBUMIN 2.1 (L) 01/27/2021   ALBUMIN 2.3 (L) 01/26/2021    Lab Results  Component Value Date   MG 1.8 01/13/2021   MG 1.6 (L) 01/11/2021   MG 1.7 01/10/2021   No results found for: VD25OH  No results found for: PREALBUMIN CBC EXTENDED Latest Ref Rng & Units 01/25/2021 01/21/2021 01/19/2021  WBC 4.0 - 10.5 K/uL 6.1 - 9.2  RBC 4.22 - 5.81 MIL/uL 2.43(L) - 2.69(L)  HGB 13.0 - 17.0 g/dL 7.7(L) 8.3(L) 8.3(L)  HCT 39.0 - 52.0 % 22.8(L) 23.7(L) 24.4(L)  PLT 150 - 400 K/uL 182 - 232  NEUTROABS 1.7 - 7.7 K/uL - - -  LYMPHSABS 0.7 - 4.0 K/uL - - -  There is no height or weight on file to calculate BMI.  Orders:  No orders of the defined types were placed in this encounter.  No orders of the defined types were placed in this encounter.    Procedures: No procedures performed  Clinical Data: No additional findings.  ROS:  All other systems negative, except as noted in the HPI. Review of Systems  Objective: Vital Signs: There were no vitals taken for this visit.  Specialty Comments:  No specialty comments available.  PMFS History: Patient Active Problem List   Diagnosis Date Noted  . Bradycardia   . Right below-knee amputee ([place]) 01/16/2021  . Acute osteomyelitis of right calcaneus ([place])   . Severe protein-calorie malnutrition ([place])   . AKI (acute kidney injury) ([place]) 01/04/2021  . Anemia 01/04/2021  . History of transmetatarsal amputation of right foot ([place]) 06/11/2019  . Neuropathic pain of foot, right 06/11/2019  . Memory  difficulties 01/18/2019  . Psychophysiological insomnia 01/18/2019  . Hyperlipidemia associated with type 2 diabetes mellitus ([place]) 12/13/2018  . Hypertension associated with type 2 diabetes mellitus ([place]) 12/13/2018  . Partial nontraumatic amputation of right foot ([place]) 12/13/2018  . Osteomyelitis of right foot ([place]) 12/04/2018  . Hypertensive urgency 12/04/2018  . Abnormal LFTs 12/04/2018  . Hyponatremia 12/04/2018  . Osteomyelitis ([place]) 12/04/2018  . Hypertension, essential, benign 08/20/2017  . Hypokalemia 08/20/2017  . 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 Lumbar post-laminectomy syndrome 09/16/2014  . Diabetic neuropathy ([place]) 04/04/2013  . Postlaminectomy syndrome, lumbar region 05/18/2012  . Uncontrolled diabetes mellitus with diabetic nephropathy ([place]) 05/18/2012   Past Medical History:  Diagnosis Date  . Back pain   . Diabetes mellitus   . Headache(784.0)    general  . Hyperlipidemia   . Hypertension   . Osteomyelitis ([place]) 11/2018   RIGHT FOOT    Family History  Problem Relation Age of Onset  . Cancer Mother   . Heart disease Father     Past Surgical History:  Procedure Laterality Date  . AMPUTATION Right 09/18/2013   Procedure: Amputation Right Great Toe at MTP;  Surgeon: Newt Minion, MD;  Location: [facility];  Service: Orthopedics;  Laterality: Right;  Amputation Right Great Toe at MTP  . AMPUTATION Right 12/05/2018   Procedure: PARTIAL AMPUTATION FIRST RAY RIGHT FOOT;  Surgeon: [place] Kins, DPM;  Location: [facility];  Service: Podiatry;  Laterality: Right;  . AMPUTATION Right 01/11/2021   Procedure: AMPUTATION BELOW KNEE;  Surgeon: Newt Minion, MD;  Location: [facility];  Service: Orthopedics;  Laterality: Right;  . APPLICATION OF WOUND VAC Right 01/11/2021   Procedure: APPLICATION OF WOUND VAC;  Surgeon: Newt Minion, MD;  Location: [facility];  Service: Orthopedics;   Laterality: Right;  . BONE BIOPSY Right 01/06/2021   Procedure: BONE BIOPSY;  Surgeon: [place] Kins, DPM;  [facility];  Service: Podiatry;  Laterality: Right;  . CARDIAC CATHETERIZATION  ?1990  . INCISION AND DRAINAGE Right 01/06/2021   Procedure: INCISION AND DRAINAGE, ANTIBIOTIC BEAD PLACEMENT;  Surgeon: [place] Kins, DPM;  [facility];  Service: Podiatry;  Laterality: Right;  . SPINE SURGERY     Social History   Occupational History  . Not on file  Tobacco Use  . Smoking status: Current Every Day Smoker    Packs/day: 1.00    Years: 40.00    Pack years: 40.00    Types: Cigarettes  .  Smokeless tobacco: Never Used  Vaping Use  . Vaping Use: Former  Substance and Sexual Activity  . Alcohol use: Not Currently  . Drug use: Not Currently    Comment: past use of marijuana  . Sexual activity: Not on file

## 2021-02-15 ENCOUNTER — Ambulatory Visit (INDEPENDENT_AMBULATORY_CARE_PROVIDER_SITE_OTHER): Payer: BC Managed Care – PPO | Admitting: Orthopedic Surgery

## 2021-02-15 ENCOUNTER — Encounter: Payer: Self-pay | Admitting: Orthopedic Surgery

## 2021-02-15 ENCOUNTER — Other Ambulatory Visit: Payer: Self-pay | Admitting: Physician Assistant

## 2021-02-15 ENCOUNTER — Telehealth: Payer: Self-pay

## 2021-02-15 DIAGNOSIS — Z89511 Acquired absence of right leg below knee: Secondary | ICD-10-CM

## 2021-02-15 DIAGNOSIS — S88111A Complete traumatic amputation at level between knee and ankle, right lower leg, initial encounter: Secondary | ICD-10-CM

## 2021-02-15 MED ORDER — OXYCODONE-ACETAMINOPHEN 5-325 MG PO TABS: 1.0000 | ORAL_TABLET | ORAL | 0 refills | Status: AC | PRN

## 2021-02-15 MED ORDER — DOXYCYCLINE HYCLATE 100 MG PO TABS: 100.0000 mg | ORAL_TABLET | Freq: Two times a day (BID) | ORAL | 0 refills | Status: AC

## 2021-02-15 NOTE — Telephone Encounter (Signed)
done

## 2021-02-15 NOTE — Progress Notes (Signed)
Office Visit Note   Patient: Fernando Anthony           Date of Birth: 12-25-1955           MRN: [id number] Visit Date: 02/15/2021              Requested by: Curly Rim, MD [place],  [place] [id number] PCP: Curly Rim, MD  Chief Complaint  Patient presents with  . Right Leg - Routine Post Op    01/11/21 right BKA       HPI: Patient is a 65 year old gentleman who presents for follow-up for a right below the knee amputation.  Patient is about 5 weeks out from surgery.  Patient states he has acute wound breakdown medially with bloody drainage.  Assessment & Plan: Visit Diagnoses: No diagnosis found.  Plan: Patient will start washing the wound with soap and water pack open with a 4 x 4 gauze applied 4 x 4 plus an Ace wrap and a prescription for doxycycline.  Follow-Up Instructions: Return in about 1 week (around 02/22/2021).   Ortho Exam  Patient is alert, oriented, no adenopathy, well-dressed, normal affect, normal respiratory effort. Examination patient has a wound breakdown medially along the incision the wound is 5 mm in diameter about a centimeter deep there is retained Vicryl suture and the knot and the suture were removed that were visible in the wound.  The remainder of the amputation is healed well with no skin breakdown no cellulitis.  The wound was packed open with a 4 x 4 gauze and a dry dressing applied.  Patient's most recent hemoglobin A1c is 10.5.  Imaging: No results found. No images are attached to the encounter.  Labs: Lab Results  Component Value Date   HGBA1C 10.5 (H) 01/04/2021   ESRSEDRATE 129 (H) 01/04/2021   CRP 19.3 (H) 01/04/2021   LABURIC 7.8 01/07/2021   REPTSTATUS 01/16/2021 FINAL 01/11/2021   REPTSTATUS 01/16/2021 FINAL 01/11/2021   GRAMSTAIN NO WBC SEEN RARE GRAM POSITIVE COCCI  01/06/2021   CULT  01/11/2021    NO GROWTH 5 DAYS Performed at [facility], [place] [address], [place], [place] [address]     CULT  01/11/2021    NO GROWTH 5 DAYS Performed at [facility] [address] [place], [place] [address]    LABORGA CITROBACTER KOSERI 01/04/2021     Lab Results  Component Value Date   ALBUMIN 2.1 (L) 01/28/2021   ALBUMIN 2.1 (L) 01/27/2021   ALBUMIN 2.3 (L) 01/26/2021    Lab Results  Component Value Date   MG 1.8 01/13/2021   MG 1.6 (L) 01/11/2021   MG 1.7 01/10/2021   No results found for: VD25OH  No results found for: PREALBUMIN CBC EXTENDED Latest Ref Rng & Units 01/25/2021 01/21/2021 01/19/2021  WBC 4.0 - 10.5 K/uL 6.1 - 9.2  RBC 4.22 - 5.81 MIL/uL 2.43(L) - 2.69(L)  HGB 13.0 - 17.0 g/dL 7.7(L) 8.3(L) 8.3(L)  HCT 39.0 - 52.0 % 22.8(L) 23.7(L) 24.4(L)  PLT 150 - 400 K/uL 182 - 232  NEUTROABS 1.7 - 7.7 K/uL - - -  LYMPHSABS 0.7 - 4.0 K/uL - - -     There is no height or weight on file to calculate BMI.  Orders:  No orders of the defined types were placed in this encounter.  No orders of the defined types were placed in this encounter.    Procedures: No procedures performed  Clinical Data: No additional findings.  ROS:  All other systems negative, except as noted in the HPI. Review of Systems  Objective: Vital Signs: There were no vitals taken for this visit.  Specialty Comments:  No specialty comments available.  PMFS History: Patient Active Problem List   Diagnosis Date Noted  . Bradycardia   . Right below-knee amputee ([place]) 01/16/2021  . Acute osteomyelitis of right calcaneus ([place])   . Severe protein-calorie malnutrition ([place])   . AKI (acute kidney injury) ([place]) 01/04/2021  . Anemia 01/04/2021  . History of transmetatarsal amputation of right foot ([place]) 06/11/2019  . Neuropathic pain of foot, right 06/11/2019  . Memory difficulties 01/18/2019  . Psychophysiological insomnia 01/18/2019  . Hyperlipidemia associated with type 2 diabetes mellitus ([place]) 12/13/2018  . Hypertension associated with type 2 diabetes mellitus ([place]) 12/13/2018  .  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 Osteomyelitis of right foot ([place]) 12/04/2018  . Hypertensive urgency 12/04/2018  . Abnormal LFTs 12/04/2018  . Hyponatremia 12/04/2018  . Osteomyelitis ([place]) 12/04/2018  . Hypertension, essential, benign 08/20/2017  . Hypokalemia 08/20/2017  . 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 Lumbar post-laminectomy syndrome 09/16/2014  . Diabetic neuropathy ([place]) 04/04/2013  . Postlaminectomy syndrome, lumbar region 05/18/2012  . Uncontrolled diabetes mellitus with diabetic nephropathy ([place]) 05/18/2012   Past Medical History:  Diagnosis Date  . Back pain   . Diabetes mellitus   . Headache(784.0)    general  . Hyperlipidemia   . Hypertension   . Osteomyelitis ([place]) 11/2018   RIGHT FOOT    Family History  Problem Relation Age of Onset  . Cancer Mother   . Heart disease Father     Past Surgical History:  Procedure Laterality Date  . AMPUTATION Right 09/18/2013   Procedure: Amputation Right Great Toe at MTP;  Surgeon: Newt Minion, MD;  Location: [facility];  Service: Orthopedics;  Laterality: Right;  Amputation Right Great Toe at MTP  . AMPUTATION Right 12/05/2018   Procedure: PARTIAL AMPUTATION FIRST RAY RIGHT FOOT;  Surgeon: Edrick Kins, DPM;  Location: [facility];  Service: Podiatry;  Laterality: Right;  . AMPUTATION Right 01/11/2021   Procedure: AMPUTATION BELOW KNEE;  Surgeon: Newt Minion, MD;  Location: [facility];  Service: Orthopedics;  Laterality: Right;  . APPLICATION OF WOUND VAC Right 01/11/2021   Procedure: APPLICATION OF WOUND VAC;  Surgeon: Newt Minion, MD;  Location: [facility];  Service: Orthopedics;  Laterality: Right;  . BONE BIOPSY Right 01/06/2021   Procedure: BONE BIOPSY;  Surgeon: Edrick Kins, DPM;  [facility];  Service: Podiatry;  Laterality: Right;  . CARDIAC CATHETERIZATION  ?1990  . INCISION AND DRAINAGE  Right 01/06/2021   Procedure: INCISION AND DRAINAGE, ANTIBIOTIC BEAD PLACEMENT;  Surgeon: Edrick Kins, DPM;  [facility];  Service: Podiatry;  Laterality: Right;  . SPINE SURGERY     Social History   Occupational History  . Not on file  Tobacco Use  . Smoking status: Current Every Day Smoker    Packs/day: 1.00    Years: 40.00    Pack years: 40.00    Types: Cigarettes  . Smokeless tobacco: Never Used  Vaping Use  . Vaping Use: Former  Substance and Sexual Activity  . Alcohol use: Not Currently  . Drug use: Not Currently    Comment: past use of marijuana  . Sexual activity: Not on file

## 2021-02-15 NOTE — Telephone Encounter (Signed)
Please advise 

## 2021-02-15 NOTE — Telephone Encounter (Signed)
Pt called and would like a refill on his oxycodone

## 2021-02-23 ENCOUNTER — Encounter: Payer: Self-pay | Admitting: Orthopedic Surgery

## 2021-02-23 ENCOUNTER — Ambulatory Visit (INDEPENDENT_AMBULATORY_CARE_PROVIDER_SITE_OTHER): Payer: BC Managed Care – PPO | Admitting: Orthopedic Surgery

## 2021-02-23 DIAGNOSIS — L97911 Non-pressure chronic ulcer of unspecified part of right lower leg limited to breakdown of skin: Secondary | ICD-10-CM

## 2021-02-23 DIAGNOSIS — S88111A Complete traumatic amputation at level between knee and ankle, right lower leg, initial encounter: Secondary | ICD-10-CM

## 2021-02-23 NOTE — Progress Notes (Signed)
Office Visit Note   Patient: Fernando Anthony           Date of Birth: [birth year]           MRN: [id number] Visit Date: 02/23/2021              Requested by: Curly Rim, MD [place],  [place] [address] PCP: Curly Rim, MD  Chief Complaint  Patient presents with  . Right Knee - Follow-up      HPI: Patient is a 65 year old gentleman who is status post a right transtibial amputation approximately 6 weeks ago.  He is currently packing the wound open over the medial aspect of the transtibial amputation.  Assessment & Plan: Visit Diagnoses:  1. Below-knee amputation of right lower extremity ([place])   2. Ulcer of right lower extremity, limited to breakdown of skin ([place])     Plan: The wound was packed open with Iodosorb compression wrap was applied continue daily packing and washing with soap and water.  Follow-Up Instructions: Return in about 2 weeks (around 03/09/2021).   Ortho Exam  Patient is alert, oriented, no adenopathy, well-dressed, normal affect, normal respiratory effort. Examination the wound bed has 100% healthy granulation tissue there is no exposed bone or tendon there is no drainage.  The wound is 2 cm in diameter 2 cm deep.  Imaging: No results found. No images are attached to the encounter.  Labs: Lab Results  Component Value Date   HGBA1C 10.5 (H) 01/04/2021   ESRSEDRATE 129 (H) 01/04/2021   CRP 19.3 (H) 01/04/2021   LABURIC 7.8 01/07/2021   REPTSTATUS 01/16/2021 FINAL 01/11/2021   REPTSTATUS 01/16/2021 FINAL 01/11/2021   GRAMSTAIN NO WBC SEEN RARE GRAM POSITIVE COCCI  01/06/2021   CULT  01/11/2021    NO GROWTH 5 DAYS Performed at [facility], [place] [address] [place], [place] [address]    CULT  01/11/2021    NO GROWTH 5 DAYS Performed at [facility] [address] [place], [place] [address]    LABORGA CITROBACTER KOSERI 01/04/2021     Lab Results  Component Value Date   ALBUMIN 2.1 (L) 01/28/2021    ALBUMIN 2.1 (L) 01/27/2021   ALBUMIN 2.3 (L) 01/26/2021    Lab Results  Component Value Date   MG 1.8 01/13/2021   MG 1.6 (L) 01/11/2021   MG 1.7 01/10/2021   No results found for: VD25OH  No results found for: PREALBUMIN CBC EXTENDED Latest Ref Rng & Units 01/25/2021 01/21/2021 01/19/2021  WBC 4.0 - 10.5 K/uL 6.1 - 9.2  RBC 4.22 - 5.81 MIL/uL 2.43(L) - 2.69(L)  HGB 13.0 - 17.0 g/dL 7.7(L) 8.3(L) 8.3(L)  HCT 39.0 - 52.0 % 22.8(L) 23.7(L) 24.4(L)  PLT 150 - 400 K/uL 182 - 232  NEUTROABS 1.7 - 7.7 K/uL - - -  LYMPHSABS 0.7 - 4.0 K/uL - - -     There is no height or weight on file to calculate BMI.  Orders:  No orders of the defined types were placed in this encounter.  No orders of the defined types were placed in this encounter.    Procedures: No procedures performed  Clinical Data: No additional findings.  ROS:  All other systems negative, except as noted in the HPI. Review of Systems  Objective: Vital Signs: There were no vitals taken for this visit.  Specialty Comments:  No specialty comments available.  PMFS History: Patient Active Problem List  Diagnosis Date Noted  . Bradycardia   . Right below-knee amputee ([place]) 01/16/2021  . Acute osteomyelitis of right calcaneus ([place])   . Severe protein-calorie malnutrition ([place])   . AKI (acute kidney injury) ([place]) 01/04/2021  . Anemia 01/04/2021  . History of transmetatarsal amputation of right foot ([place]) 06/11/2019  . Neuropathic pain of foot, right 06/11/2019  . Memory difficulties 01/18/2019  . Psychophysiological insomnia 01/18/2019  . Hyperlipidemia associated with type 2 diabetes mellitus ([place]) 12/13/2018  . Hypertension associated with type 2 diabetes mellitus ([place]) 12/13/2018  . Partial nontraumatic amputation of right foot ([place]) 12/13/2018  . Osteomyelitis of right foot ([place]) 12/04/2018  . Hypertensive urgency 12/04/2018  . Abnormal LFTs 12/04/2018  . Hyponatremia 12/04/2018  . Osteomyelitis ([place])  12/04/2018  . Hypertension, essential, benign 08/20/2017  . Hypokalemia 08/20/2017  . 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 Lumbar post-laminectomy syndrome 09/16/2014  . Diabetic neuropathy ([place]) 04/04/2013  . Postlaminectomy syndrome, lumbar region 05/18/2012  . Uncontrolled diabetes mellitus with diabetic nephropathy ([place]) 05/18/2012   Past Medical History:  Diagnosis Date  . Back pain   . Diabetes mellitus   . Headache(784.0)    general  . Hyperlipidemia   . Hypertension   . Osteomyelitis ([place]) 11/2018   RIGHT FOOT    Family History  Problem Relation Age of Onset  . Cancer Mother   . Heart disease Father     Past Surgical History:  Procedure Laterality Date  . AMPUTATION Right 09/18/2013   Procedure: Amputation Right Great Toe at [facility]TP;  Surgeon: Newt Minion, MD;  Location: [facility];  Service: Orthopedics;  Laterality: Right;  Amputation Right Great Toe at [facility]TP  . AMPUTATION Right 12/05/2018   Procedure: PARTIAL AMPUTATION FIRST RAY RIGHT FOOT;  Surgeon: Edrick Kins, DPM;  Location: [facility];  Service: Podiatry;  Laterality: Right;  . AMPUTATION Right 01/11/2021   Procedure: AMPUTATION BELOW KNEE;  Surgeon: Newt Minion, MD;  Location: [facility];  Service: Orthopedics;  Laterality: Right;  . APPLICATION OF WOUND VAC Right 01/11/2021   Procedure: APPLICATION OF WOUND VAC;  Surgeon: Newt Minion, MD;  Location: [facility];  Service: Orthopedics;  Laterality: Right;  . BONE BIOPSY Right 01/06/2021   Procedure: BONE BIOPSY;  Surgeon: Edrick Kins, DPM;  Location: [facility];  Service: Podiatry;  Laterality: Right;  . CARDIAC CATHETERIZATION  ?1990  . INCISION AND DRAINAGE Right 01/06/2021   Procedure: INCISION AND DRAINAGE, ANTIBIOTIC BEAD PLACEMENT;  Surgeon: Edrick Kins, DPM;  Location[facility];  Service: Podiatry;  Laterality: Right;  . SPINE SURGERY     Social History   Occupational  History  . Not on file  Tobacco Use  . Smoking status: Current Every Day Smoker    Packs/day: 1.00    Years: 40.00    Pack years: 40.00    Types: Cigarettes  . Smokeless tobacco: Never Used  Vaping Use  . Vaping Use: Former  Substance and Sexual Activity  . Alcohol use: Not Currently  . Drug use: Not Currently    Comment: past use of marijuana  . Sexual activity: Not on file

## 2021-03-01 ENCOUNTER — Other Ambulatory Visit: Payer: Self-pay | Admitting: Physician Assistant

## 2021-03-01 ENCOUNTER — Telehealth: Payer: Self-pay

## 2021-03-01 MED ORDER — SULFAMETHOXAZOLE-TRIMETHOPRIM 800-160 MG PO TABS: 1.0000 | ORAL_TABLET | Freq: Two times a day (BID) | ORAL | 0 refills | Status: AC

## 2021-03-01 NOTE — Telephone Encounter (Signed)
Patient called he stated antibiotic doxycycline is making his sick patient stated the rx makes this throw up/ dry heave he is requesting a different rx to be sent to the pharmacy call [phone]

## 2021-03-01 NOTE — Telephone Encounter (Signed)
I called pt and advised of message below. To call with any questions.

## 2021-03-01 NOTE — Telephone Encounter (Signed)
Please see message below. Looks like he started doxy the end of march.

## 2021-03-01 NOTE — Telephone Encounter (Signed)
I called in Bactrim.  Please also tell the patient he should be on a probiotic

## 2021-03-02 ENCOUNTER — Inpatient Hospital Stay: Payer: BC Managed Care – PPO | Admitting: Physical Medicine and Rehabilitation

## 2021-03-09 ENCOUNTER — Ambulatory Visit (INDEPENDENT_AMBULATORY_CARE_PROVIDER_SITE_OTHER): Payer: BC Managed Care – PPO | Admitting: Physician Assistant

## 2021-03-09 ENCOUNTER — Other Ambulatory Visit (HOSPITAL_COMMUNITY): Payer: Self-pay | Admitting: Internal Medicine

## 2021-03-09 ENCOUNTER — Encounter: Payer: Self-pay | Admitting: Orthopedic Surgery

## 2021-03-09 DIAGNOSIS — Z89511 Acquired absence of right leg below knee: Secondary | ICD-10-CM

## 2021-03-09 DIAGNOSIS — R809 Proteinuria, unspecified: Secondary | ICD-10-CM

## 2021-03-09 DIAGNOSIS — S88111A Complete traumatic amputation at level between knee and ankle, right lower leg, initial encounter: Secondary | ICD-10-CM

## 2021-03-09 NOTE — Progress Notes (Signed)
Office Visit Note   Patient: Fernando Anthony           Date of Birth: Nov 28, 1955           MRN: [id number] Visit Date: 03/09/2021              Requested by: Curly Rim, MD [place],  [place] [address] PCP: Curly Rim, MD  Chief Complaint  Patient presents with  . Right Leg - Routine Post Op    01/11/21 right BKA       HPI: Patient presents today 2 months status post right below-knee amputation he does have 1 area of wound dehiscence on the medial side.  He has been packing this with gauze and feels like it is requiring less gauze.  He was both on doxycycline and then was switched to Bactrim.  He was unable to tolerate either of these and is currently not on any antibiotic  Assessment & Plan: Visit Diagnoses: No diagnosis found.  Plan: We will continue to pack I do see good granulation tissue and improvement depth of the wound has become more shallow by half a centimeter we will follow-up in 2 weeks  Follow-Up Instructions: No follow-ups on file.   Ortho Exam  Patient is alert, oriented, no adenopathy, well-dressed, normal affect, normal respiratory effort. Below-knee amputation stump is well-healed with the exception of the small area medially.  This measures about 3 cm in diameter and is 1-1/2 cm deep.  There is excellent healthy granulation tissue at the base of the wound.  There is no foul odor no surrounding cellulitis no evidence of infection  Imaging: No results found. No images are attached to the encounter.  Labs: Lab Results  Component Value Date   HGBA1C 10.5 (H) 01/04/2021   ESRSEDRATE 129 (H) 01/04/2021   CRP 19.3 (H) 01/04/2021   LABURIC 7.8 01/07/2021   REPTSTATUS 01/16/2021 FINAL 01/11/2021   REPTSTATUS 01/16/2021 FINAL 01/11/2021   GRAMSTAIN NO WBC SEEN RARE GRAM POSITIVE COCCI  01/06/2021   CULT  01/11/2021    NO GROWTH 5 DAYS Performed at [facility], [place] [address], [place], [place] [address]    CULT   01/11/2021    NO GROWTH 5 DAYS Performed at [facility] [address] [place], [place] [address]    LABORGA CITROBACTER KOSERI 01/04/2021     Lab Results  Component Value Date   ALBUMIN 2.1 (L) 01/28/2021   ALBUMIN 2.1 (L) 01/27/2021   ALBUMIN 2.3 (L) 01/26/2021    Lab Results  Component Value Date   MG 1.8 01/13/2021   MG 1.6 (L) 01/11/2021   MG 1.7 01/10/2021   No results found for: VD25OH  No results found for: PREALBUMIN CBC EXTENDED Latest Ref Rng & Units 01/25/2021 01/21/2021 01/19/2021  WBC 4.0 - 10.5 K/uL 6.1 - 9.2  RBC 4.22 - 5.81 MIL/uL 2.43(L) - 2.69(L)  HGB 13.0 - 17.0 g/dL 7.7(L) 8.3(L) 8.3(L)  HCT 39.0 - 52.0 % 22.8(L) 23.7(L) 24.4(L)  PLT 150 - 400 K/uL 182 - 232  NEUTROABS 1.7 - 7.7 K/uL - - -  LYMPHSABS 0.7 - 4.0 K/uL - - -     There is no height or weight on file to calculate BMI.  Orders:  No orders of the defined types were placed in this encounter.  No orders of the defined types were placed in this encounter.    Procedures: No procedures performed  Clinical  Data: No additional findings.  ROS:  All other systems negative, except as noted in the HPI. Review of Systems  Objective: Vital Signs: There were no vitals taken for this visit.  Specialty Comments:  No specialty comments available.  PMFS History: Patient Active Problem List   Diagnosis Date Noted  . Bradycardia   . Right below-knee amputee ([place]) 01/16/2021  . Acute osteomyelitis of right calcaneus ([place])   . Severe protein-calorie malnutrition ([place])   . AKI (acute kidney injury) ([place]) 01/04/2021  . Anemia 01/04/2021  . History of transmetatarsal amputation of right foot ([place]) 06/11/2019  . Neuropathic pain of foot, right 06/11/2019  . Memory difficulties 01/18/2019  . Psychophysiological insomnia 01/18/2019  . Hyperlipidemia associated with type 2 diabetes mellitus ([place]) 12/13/2018  . Hypertension associated with type 2 diabetes mellitus ([place]) 12/13/2018  .  Partial nontraumatic amputation of right foot ([place]) 12/13/2018  . Osteomyelitis of right foot ([place]) 12/04/2018  . Hypertensive urgency 12/04/2018  . Abnormal LFTs 12/04/2018  . Hyponatremia 12/04/2018  . Osteomyelitis ([place]) 12/04/2018  . Hypertension, essential, benign 08/20/2017  . Hypokalemia 08/20/2017  . 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 Lumbar post-laminectomy syndrome 09/16/2014  . Diabetic neuropathy ([place]) 04/04/2013  . Postlaminectomy syndrome, lumbar region 05/18/2012  . Uncontrolled diabetes mellitus with diabetic nephropathy ([place]) 05/18/2012   Past Medical History:  Diagnosis Date  . Back pain   . Diabetes mellitus   . Headache(784.0)    general  . Hyperlipidemia   . Hypertension   . Osteomyelitis ([place]) 11/2018   RIGHT FOOT    Family History  Problem Relation Age of Onset  . Cancer Mother   . Heart disease Father     Past Surgical History:  Procedure Laterality Date  . AMPUTATION Right 09/18/2013   Procedure: Amputation Right Great Toe at [facility];  Surgeon: Newt Minion, MD;  Location: [facility];  Service: Orthopedics;  Laterality: Right;  Amputation Right Great Toe at MTP  . AMPUTATION Right 12/05/2018   Procedure: PARTIAL AMPUTATION FIRST RAY RIGHT FOOT;  Surgeon: Edrick Kins, DPM;  Location: [facility];  Service: Podiatry;  Laterality: Right;  . AMPUTATION Right 01/11/2021   Procedure: AMPUTATION BELOW KNEE;  Surgeon: Newt Minion, MD;  Location: [facility];  Service: Orthopedics;  Laterality: Right;  . APPLICATION OF WOUND VAC Right 01/11/2021   Procedure: APPLICATION OF WOUND VAC;  Surgeon: Newt Minion, MD;  Location: [facility];  Service: Orthopedics;  Laterality: Right;  . BONE BIOPSY Right 01/06/2021   Procedure: BONE BIOPSY;  Surgeon: Edrick Kins, DPM;  [facility];  Service: Podiatry;  Laterality: Right;  . CARDIAC CATHETERIZATION  ?1990  . INCISION AND DRAINAGE  Right 01/06/2021   Procedure: INCISION AND DRAINAGE, ANTIBIOTIC BEAD PLACEMENT;  Surgeon: Edrick Kins, DPM;  [facility];  Service: Podiatry;  Laterality: Right;  . SPINE SURGERY     Social History   Occupational History  . Not on file  Tobacco Use  . Smoking status: Current Every Day Smoker    Packs/day: 1.00    Years: 40.00    Pack years: 40.00    Types: Cigarettes  . Smokeless tobacco: Never Used  Vaping Use  . Vaping Use: Former  Substance and Sexual Activity  . Alcohol use: Not Currently  . Drug use: Not Currently    Comment: past use of marijuana  . Sexual activity: Not on file

## 2021-03-14 ENCOUNTER — Other Ambulatory Visit: Payer: Self-pay | Admitting: Orthopedic Surgery

## 2021-03-17 ENCOUNTER — Other Ambulatory Visit: Payer: Self-pay | Admitting: Student

## 2021-03-17 ENCOUNTER — Other Ambulatory Visit: Payer: Self-pay | Admitting: Radiology

## 2021-03-18 ENCOUNTER — Ambulatory Visit (HOSPITAL_COMMUNITY): Admission: RE | Admit: 2021-03-18 | Payer: BC Managed Care – PPO | Source: Ambulatory Visit

## 2021-03-23 ENCOUNTER — Other Ambulatory Visit: Payer: Self-pay | Admitting: Radiology

## 2021-03-23 ENCOUNTER — Ambulatory Visit: Payer: BC Managed Care – PPO | Admitting: Orthopedic Surgery

## 2021-03-24 ENCOUNTER — Encounter (HOSPITAL_COMMUNITY): Payer: Self-pay

## 2021-03-24 ENCOUNTER — Other Ambulatory Visit: Payer: Self-pay

## 2021-03-24 ENCOUNTER — Ambulatory Visit (HOSPITAL_COMMUNITY)
Admission: RE | Admit: 2021-03-24 | Discharge: 2021-03-24 | Disposition: A | Discharge status: Home / Self Care | Payer: BC Managed Care – PPO | Source: Ambulatory Visit | Attending: Internal Medicine | Admitting: Internal Medicine

## 2021-03-24 DIAGNOSIS — N1832 Chronic kidney disease, stage 3b: Secondary | ICD-10-CM | POA: Diagnosis not present

## 2021-03-24 DIAGNOSIS — N179 Acute kidney failure, unspecified: Secondary | ICD-10-CM | POA: Insufficient documentation

## 2021-03-24 DIAGNOSIS — R809 Proteinuria, unspecified: Secondary | ICD-10-CM | POA: Insufficient documentation

## 2021-03-24 LAB — BASIC METABOLIC PANEL
Anion gap: 8 (ref 5–15)
BUN: 29 mg/dL — ABNORMAL HIGH (ref 8–23)
CO2: 24 mmol/L (ref 22–32)
Calcium: 8.3 mg/dL — ABNORMAL LOW (ref 8.9–10.3)
Chloride: 99 mmol/L (ref 98–111)
Creatinine, Ser: 2.7 mg/dL — ABNORMAL HIGH (ref 0.61–1.24)
GFR, Estimated: 26 mL/min — ABNORMAL LOW (ref >60)
Glucose, Bld: 371 mg/dL — ABNORMAL HIGH (ref 70–99)
Potassium: 3 mmol/L — ABNORMAL LOW (ref 3.5–5.1)
Sodium: 131 mmol/L — ABNORMAL LOW (ref 135–145)

## 2021-03-24 LAB — CBC
HCT: 37.7 % — ABNORMAL LOW (ref 39.0–52.0)
Hemoglobin: 13.1 g/dL (ref 13.0–17.0)
MCH: 30.9 pg (ref 26.0–34.0)
MCHC: 34.7 g/dL (ref 30.0–36.0)
MCV: 88.9 fL (ref 80.0–100.0)
Platelets: 227 10*3/uL (ref 150–400)
RBC: 4.24 MIL/uL (ref 4.22–5.81)
RDW: 13.7 % (ref 11.5–15.5)
WBC: 10.1 10*3/uL (ref 4.0–10.5)
nRBC: 0 % (ref 0.0–0.2)

## 2021-03-24 LAB — GLUCOSE, CAPILLARY
Glucose-Capillary: 410 mg/dL — ABNORMAL HIGH (ref 70–99)
Glucose-Capillary: 318 mg/dL — ABNORMAL HIGH (ref 70–99)
Glucose-Capillary: 262 mg/dL — ABNORMAL HIGH (ref 70–99)
Glucose-Capillary: 177 mg/dL — ABNORMAL HIGH (ref 70–99)
Glucose-Capillary: 201 mg/dL — ABNORMAL HIGH (ref 70–99)

## 2021-03-24 LAB — PROTIME-INR
INR: 1 (ref 0.8–1.2)
Prothrombin Time: 13.1 seconds (ref 11.4–15.2)

## 2021-03-24 MED ORDER — FENTANYL CITRATE (PF) 100 MCG/2ML IJ SOLN
INTRAMUSCULAR | Status: AC
  Filled 2021-03-24: qty 4

## 2021-03-24 MED ORDER — HYDRALAZINE HCL 20 MG/ML IJ SOLN
10.0000 mg | Freq: Once | INTRAMUSCULAR | Status: AC
  Administered 2021-03-24: 5 mg via INTRAVENOUS
  Filled 2021-03-24: qty 1

## 2021-03-24 MED ORDER — INSULIN ASPART 100 UNIT/ML IJ SOLN
3.0000 [IU] | Freq: Once | INTRAMUSCULAR | Status: DC
  Filled 2021-03-24: qty 1
  Filled 2021-03-24: qty 0.03

## 2021-03-24 MED ORDER — HYDRALAZINE HCL 20 MG/ML IJ SOLN
5.0000 mg | Freq: Once | INTRAMUSCULAR | Status: AC
  Administered 2021-03-24: 5 mg via INTRAVENOUS

## 2021-03-24 MED ORDER — LIDOCAINE-EPINEPHRINE 1 %-1:100000 IJ SOLN
INTRAMUSCULAR | Status: AC
  Filled 2021-03-24: qty 1

## 2021-03-24 MED ORDER — GELATIN ABSORBABLE 12-7 MM EX MISC
CUTANEOUS | Status: AC
  Filled 2021-03-24: qty 1

## 2021-03-24 MED ORDER — MIDAZOLAM HCL 2 MG/2ML IJ SOLN
INTRAMUSCULAR | Status: AC
  Filled 2021-03-24: qty 4

## 2021-03-24 MED ORDER — CLONIDINE HCL 0.2 MG PO TABS
0.2000 mg | ORAL_TABLET | Freq: Once | ORAL | Status: DC
  Filled 2021-03-24: qty 1

## 2021-03-24 MED ORDER — FENTANYL CITRATE (PF) 100 MCG/2ML IJ SOLN
INTRAMUSCULAR | Status: AC | PRN
  Administered 2021-03-24 (×2): 50 ug via INTRAVENOUS

## 2021-03-24 MED ORDER — SODIUM CHLORIDE 0.9 % IV SOLN: INTRAVENOUS | Status: DC

## 2021-03-24 MED ORDER — MIDAZOLAM HCL 2 MG/2ML IJ SOLN
INTRAMUSCULAR | Status: AC | PRN
  Administered 2021-03-24 (×2): 1 mg via INTRAVENOUS

## 2021-03-24 NOTE — H&P (Signed)
Chief Complaint: Patient was seen in consultation today for proteinuria  Referring Physician(s): Peeples,Samuel J  Supervising Physician: Sandi Mariscal  Patient Status: [facility] - Out-pt  History of Present Illness: Fernando Anthony is a 65 y.o. male with past medical history of DM, HLD, HTN, osteomyelitis s/p recent right BKA who presents with proteinuria.  IR consulted for random renal biopsy at the request of Dr. Joylene Grapes.   Patient presents to [facility] today in his usual state of health.  He denies new concerns or complaints.  Reports his wife manages his medications and he has not been managing his blood sugars closely since surgery. He understands the goals of the procedure today and is agreeable to proceed.   Past Medical History:  Diagnosis Date  . Back pain   . Diabetes mellitus   . Headache(784.0)    general  . Hyperlipidemia   . Hypertension   . Osteomyelitis ([place]) 11/2018   RIGHT FOOT    Past Surgical History:  Procedure Laterality Date  . AMPUTATION Right 09/18/2013   Procedure: Amputation Right Great Toe at MTP;  Surgeon: Newt Minion, MD;  Location: [place];  Service: Orthopedics;  Laterality: Right;  Amputation Right Great Toe at MTP  . AMPUTATION Right 12/05/2018   Procedure: PARTIAL AMPUTATION FIRST RAY RIGHT FOOT;  Surgeon: Edrick Kins, DPM;  Location: [place];  Service: Podiatry;  Laterality: Right;  . AMPUTATION Right 01/11/2021   Procedure: AMPUTATION BELOW KNEE;  Surgeon: Newt Minion, MD;  Location: [place];  Service: Orthopedics;  Laterality: Right;  . APPLICATION OF WOUND VAC Right 01/11/2021   Procedure: APPLICATION OF WOUND VAC;  Surgeon: Newt Minion, MD;  Location: [place];  Service: Orthopedics;  Laterality: Right;  . BONE BIOPSY Right 01/06/2021   Procedure: BONE BIOPSY;  Surgeon: Edrick Kins, DPM;  [facility];  Service: Podiatry;  Laterality: Right;  . CARDIAC CATHETERIZATION  ?1990  . INCISION AND DRAINAGE Right 01/06/2021   Procedure:  INCISION AND DRAINAGE, ANTIBIOTIC BEAD PLACEMENT;  Surgeon: Edrick Kins, DPM;  [facility];  Service: Podiatry;  Laterality: Right;  . SPINE SURGERY      Allergies: Bactrim [sulfamethoxazole-trimethoprim], Doxycycline, Oxycodone, and Vicodin [hydrocodone-acetaminophen]  Medications: Prior to Admission medications   Medication Sig Start Date End Date Taking? Authorizing Provider  acetaminophen (TYLENOL) 325 MG tablet Take 1-2 tablets (325-650 mg total) by mouth every 6 (six) hours as needed for mild pain (pain score 1-3 or temp > 100.5). 01/16/21  Yes Barb Merino, MD  acetaminophen (TYLENOL) 650 MG CR tablet Take 650 mg by mouth every 8 (eight) hours as needed for pain.   Yes [provider]  amLODipine (NORVASC) 10 MG tablet Take 1 tablet (10 mg total) by mouth daily. 01/27/21  Yes Angiulli, Lavon Paganini, PA-C  aspirin 81 MG tablet Take 81 mg by mouth at bedtime.    Yes [provider]  chlorthalidone (HYGROTON) 25 MG tablet Take 12.5 mg by mouth at bedtime.   Yes [provider]  cloNIDine (CATAPRES) 0.2 MG tablet Take 1 tablet (0.2 mg total) by mouth 2 (two) times daily. 01/27/21  Yes Angiulli, Lavon Paganini, PA-C  docusate sodium (COLACE) 100 MG capsule Take 1 capsule (100 mg total) by mouth 2 (two) times daily. Patient taking differently: Take 100 mg by mouth 2 (two) times daily as needed for mild constipation. 01/27/21  Yes Angiulli, Lavon Paganini, PA-C  FIASP FLEXTOUCH 100 UNIT/ML FlexTouch Pen Inject 12 Units  into the skin 3 (three) times daily as needed (high blood sugar). 03/12/21  Yes [provider]  hydrALAZINE (APRESOLINE) 100 MG tablet Take 1 tablet (100 mg total) by mouth 2 (two) times daily. 01/27/21  Yes Angiulli, Lavon Paganini, PA-C  insulin glargine (LANTUS) 100 UNIT/ML Solostar Pen Inject 20 Units into the skin daily. 01/27/21  Yes Angiulli, Lavon Paganini, PA-C  iron polysaccharides (NIFEREX) 150 MG capsule Take 1 capsule (150 mg total) by mouth daily. 01/27/21  Yes  Angiulli, Lavon Paganini, PA-C  methocarbamol (ROBAXIN) 500 MG tablet Take 1 tablet (500 mg total) by mouth every 6 (six) hours as needed for muscle spasms. 01/27/21  Yes Angiulli, Lavon Paganini, PA-C  Omega-3 Fatty Acids (FISH OIL) 500 MG CAPS Take 1 capsule (500 mg total) by mouth at bedtime. 01/27/21  Yes Angiulli, Lavon Paganini, PA-C  polyethylene glycol (MIRALAX / GLYCOLAX) 17 g packet Take 17 g by mouth daily as needed for mild constipation (PRN for DAYTIME - has separate Senokot-S for bedtime prn). 01/16/21  Yes Barb Merino, MD  pregabalin (LYRICA) 50 MG capsule Take 1 capsule (50 mg total) by mouth 3 (three) times daily. Patient taking differently: Take 50 mg by mouth 2 (two) times daily. 01/27/21  Yes Angiulli, Lavon Paganini, PA-C  rosuvastatin (CRESTOR) 20 MG tablet Take 1 tablet (20 mg total) by mouth daily. Patient taking differently: Take 20 mg by mouth at bedtime. 01/27/21  Yes Angiulli, Lavon Paganini, PA-C  sevelamer carbonate (RENVELA) 800 MG tablet Take 1 tablet (800 mg total) by mouth 3 (three) times daily with meals. 01/27/21  Yes Angiulli, Lavon Paganini, PA-C  sodium bicarbonate 650 MG tablet Take 2 tablets (1,300 mg total) by mouth 3 (three) times daily. Patient taking differently: Take 1,300 mg by mouth 2 (two) times daily. 01/27/21  Yes Angiulli, Lavon Paganini, PA-C  traZODone (DESYREL) 50 MG tablet Take 0.5-1 tablets (25-50 mg total) by mouth at bedtime. Patient taking differently: Take 50 mg by mouth at bedtime as needed for sleep. 01/27/21  Yes Angiulli, Lavon Paganini, PA-C  bismuth subsalicylate (PEPTO BISMOL) 262 MG/15ML suspension Take 30 mLs by mouth every 6 (six) hours as needed (nausea).    [provider]  doxycycline (VIBRA-TABS) 100 MG tablet Take 1 tablet (100 mg total) by mouth 2 (two) times daily. 02/15/21   Newt Minion, MD  oxyCODONE-acetaminophen (PERCOCET/ROXICET) 5-325 MG tablet Take 1 tablet by mouth every 4 (four) hours as needed for severe pain. Patient not taking: No sig reported 02/15/21    Persons, Bevely Palmer, PA  sulfamethoxazole-trimethoprim (BACTRIM DS) 800-160 MG tablet Take 1 tablet by mouth 2 (two) times daily. Patient not taking: No sig reported 03/01/21   Persons, Bevely Palmer, PA  tamsulosin (FLOMAX) 0.4 MG CAPS capsule Take 1 capsule (0.4 mg total) by mouth daily after supper. 01/27/21   Angiulli, Lavon Paganini, PA-C  triamcinolone ointment (KENALOG) 0.5 % Apply 1 application topically 2 (two) times daily as needed (rash).    [provider]  witch hazel-glycerin (TUCKS) pad Apply topically as needed for hemorrhoids. Patient taking differently: Apply 1 application topically as needed for hemorrhoids. 01/27/21   Angiulli, Lavon Paganini, PA-C     Family History  Problem Relation Age of Onset  . Cancer Mother   . Heart disease Father     Social History   Socioeconomic History  . Marital status: Married    Spouse name: Not on file  . Number of children: Not on file  . Years of  education: Not on file  . Highest education level: Not on file  Occupational History  . Not on file  Tobacco Use  . Smoking status: Current Every Day Smoker    Packs/day: 1.00    Years: 40.00    Pack years: 40.00    Types: Cigarettes  . Smokeless tobacco: Never Used  Vaping Use  . Vaping Use: Former  Substance and Sexual Activity  . Alcohol use: Not Currently  . Drug use: Not Currently    Comment: past use of marijuana  . Sexual activity: Not on file  Other Topics Concern  . Not on file  Social History Narrative  . Not on file   Social Determinants of Health   Financial Resource Strain: Not on file  Food Insecurity: Not on file  Transportation Needs: Not on file  Physical Activity: Not on file  Stress: Not on file  Social Connections: Not on file     Review of Systems: A 12 point ROS discussed and pertinent positives are indicated in the HPI above.  All other systems are negative.  Review of Systems  Constitutional: Negative for fatigue and fever.  Respiratory: Negative for  cough and shortness of breath.   Cardiovascular: Negative for chest pain.  Gastrointestinal: Negative for abdominal pain, nausea and vomiting.  Musculoskeletal: Negative for back pain.  Psychiatric/Behavioral: Negative for behavioral problems and confusion.    Vital Signs: BP (!) 143/80 Comment: MAP 98  Pulse 85   Temp 97.6 F (36.4 C) (Oral)   Resp 18   Ht '6\' 2"'$  (1.88 m)   Wt 200 lb (90.7 kg)   SpO2 100%   BMI 25.68 kg/m   Physical Exam Vitals and nursing note reviewed.  Constitutional:      General: He is not in acute distress.    Appearance: Normal appearance. He is not ill-appearing.  HENT:     Mouth/Throat:     Mouth: Mucous membranes are moist.     Pharynx: Oropharynx is clear.  Cardiovascular:     Rate and Rhythm: Normal rate and regular rhythm.  Pulmonary:     Effort: Pulmonary effort is normal. No respiratory distress.     Breath sounds: Normal breath sounds.  Abdominal:     General: Abdomen is flat.     Palpations: Abdomen is soft.  Skin:    General: Skin is warm and dry.  Neurological:     General: No focal deficit present.     Mental Status: He is alert and oriented to person, place, and time. Mental status is at baseline.  Psychiatric:        Mood and Affect: Mood normal.        Behavior: Behavior normal.        Judgment: Judgment normal.      MD Evaluation Airway: WNL Heart: WNL Abdomen: WNL Chest/ Lungs: WNL ASA  Classification: 3 Mallampati/Airway Score: Two   Imaging: No results found.  Labs:  CBC: Recent Labs    01/18/21 0209 01/19/21 0426 01/21/21 0253 01/25/21 0424 03/24/21 0710  WBC 10.2 9.2  --  6.1 10.1  HGB 8.0* 8.3* 8.3* 7.7* 13.1  HCT 22.1* 24.4* 23.7* 22.8* 37.7*  PLT 217 232  --  182 227    COAGS: Recent Labs    01/04/21 1649 01/13/21 0242 03/24/21 0710  INR 1.2 1.1 1.0  APTT 38*  --   --     BMP: Recent Labs    01/26/21 0458 01/27/21 0529 01/28/21 0033 03/24/21  0710  NA 135 137 138 131*  K 4.9  4.6 4.8 3.0*  CL 112* 112* 113* 99  CO2 14* 16* 18* 24  GLUCOSE 130* 116* 105* 371*  BUN 75* 70* 67* 29*  CALCIUM 8.5* 8.4* 8.1* 8.3*  CREATININE 5.02* 4.66* 4.49* 2.70*  GFRNONAA 12* 13* 14* 26*    LIVER FUNCTION TESTS: Recent Labs    01/25/21 0424 01/26/21 0458 01/27/21 0529 01/28/21 0033  BILITOT 3.1* 3.4* 2.6* 2.3*  AST 54* 49* 38 36  ALT 101* 105* 91* 81*  ALKPHOS 539* 548* 467* 474*  PROT 5.9* 6.9 6.3* 5.9*  ALBUMIN 1.9* 2.3* 2.1* 2.1*    TUMOR MARKERS: No results for input(s): AFPTM, CEA, CA199, CHROMGRNA in the last 8760 hours.  Assessment and Plan: Patient with past medical history of HTN, DM, HLD, s/p recent BKA  presents with complaint of worsening proteinuria.  IR consulted for random renal biopsy at the request of Dr. Joylene Grapes. Case reviewed by Dr. Pascal Lux who approves patient for procedure.  Patient presents today in their usual state of health.  He has been NPO and is not currently on blood thinners.   Patient's blood pressure on presentation today was 162/104, MAP 114.  Blood glucose 400.   Patient reports he took his BP meds this AM, however also defers to his wife for medication management.  SS RN spoke with wife over the phone who reports that his hydralazine and clonidine have been help per Nephrologist since March/April.  Not recently given.   Will give 5 mg hydralazine and if no improvement, proceed with additional 5 mg.   Patient refusing insulin for treatment of his glucose at this time as his continuous monitoring is alerting him that is falling rapidly.   Risks and benefits was discussed with the patient and/or patient's family including, but not limited to bleeding, infection, damage to adjacent structures or low yield requiring additional tests.  All of the questions were answered and there is agreement to proceed.  Consent signed and in chart.  Update 9:28:  Repeat BP 30 min after '5mg'$  hydralazine was 162/107.  Additional 5 mg given, now  128/72.   Patient's blood glucose has self-corrected to 318.  Will give 3u novolog for goal of <300 mg/dL while NPO and monitor.  Ok to proceed with biopsy per Dr. Pascal Lux who has discussed patient's case with Dr. Joylene Grapes who feels biopsy will ultimately be needed for best management of his renal disease.   Thank you for this interesting consult.  I greatly enjoyed meeting Fernando Anthony and look forward to participating in their care.  A copy of this report was sent to the requesting provider on this date.  Electronically Signed: Docia Barrier, PA 03/24/2021, 9:19 AM   I spent a total of  40 Minutes   in face to face in clinical consultation, greater than 50% of which was counseling/coordinating care for proteinuria.

## 2021-03-24 NOTE — Discharge Instructions (Signed)
Percutaneous Kidney Biopsy, Care After This sheet gives you information about how to care for yourself after your procedure. Your health care provider may also give you more specific instructions. If you have problems or questions, contact your health care provider. What can I expect after the procedure? After the procedure, it is common to have:  Pain or soreness near the biopsy site.  Pink or cloudy urine for 24 hours after the procedure. This is normal. Follow these instructions at home: Activity  Return to your normal activities as told by your health care provider. Ask your health care provider what activities are safe for you.  If you were given a sedative during the procedure, it can affect you for several hours. Do not drive or operate machinery until your health care provider says that it is safe.  Do not lift anything that is heavier than 10 lb (4.5 kg), or the limit that you are told, until your health care provider says that it is safe.  Avoid activities that take a lot of effort until your health care provider approves. Most people will have to wait 2 weeks before returning to activities such as exercise or sex. General instructions  Take over-the-counter and prescription medicines only as told by your health care provider.  Follow instructions from your health care provider about eating or drinking restrictions.  Check your biopsy site every day for signs of infection. Check for: ? More redness, swelling, or pain. ? Fluid or blood. ? Warmth. ? Pus or a bad smell.  Keep all follow-up visits as told by your health care provider. This is important.   Contact a health care provider if:  You have more redness, swelling, or pain around your biopsy site.  You have fluid or blood coming from your biopsy site.  Your biopsy site feels warm to the touch.  You have pus or a bad smell coming from your biopsy site.  You have blood in your urine more than 24 hours after your  procedure. Get help right away if:  Your urine is dark red or brown.  You have a fever.  You are not able to urinate.  You feel burning when you urinate.  You feel dizzy or light-headed.  You have severe pain in your abdomen or side. Summary  After the procedure, it is common to have pain or soreness at the biopsy site and pink or cloudy urine for the first 24 hours.  Check your biopsy site each day for signs of infection, such as more redness, swelling, or pain; fluid, blood, pus or a bad smell coming from the biopsy site; or the biopsy site feeling warm to the touch.  Return to your normal activities as told by your health care provider. This information is not intended to replace advice given to you by your health care provider. Make sure you discuss any questions you have with your health care provider. Document Revised: 01/31/2020 Document Reviewed: 01/31/2020 Elsevier Patient Education  2021 Elsevier Inc.  

## 2021-03-24 NOTE — Procedures (Signed)
Pre Procedure Dx: Proteinuria Post Procedural Dx: Same  Technically successful US guided biopsy of inferior pole of the left kidney.  EBL: Trace No immediate complications.   Jay Terriann Difonzo, MD Pager #: [phone]   

## 2021-03-25 ENCOUNTER — Ambulatory Visit (INDEPENDENT_AMBULATORY_CARE_PROVIDER_SITE_OTHER): Payer: BC Managed Care – PPO | Admitting: Orthopedic Surgery

## 2021-03-25 ENCOUNTER — Encounter: Payer: Self-pay | Admitting: Orthopedic Surgery

## 2021-03-25 DIAGNOSIS — S88111A Complete traumatic amputation at level between knee and ankle, right lower leg, initial encounter: Secondary | ICD-10-CM

## 2021-03-25 NOTE — Progress Notes (Signed)
Office Visit Note   Patient: Fernando Anthony           Date of Birth: 19-Aug-1956           MRN: [id number] Visit Date: 03/25/2021              Requested by: Curly Rim, MD [address] [place],  [place] [address] PCP: Curly Rim, MD  Chief Complaint  Patient presents with  . Right Leg - Routine Post Op    01/11/21 right BKA       HPI: Patient is a 65 year old gentleman who presents 5 weeks status post right transtibial amputation he is currently wearing a 4 extra-large shrinker.  Patient states he fell about a week and a half ago in the bathroom on the residual limb he states he has some soreness but no ecchymosis or bruising.  Assessment & Plan: Visit Diagnoses:  1. Below-knee amputation of right lower extremity ([place])     Plan: Patient is given a prescription for [employer] for a K3 level prosthesis.  Orders placed for [facility] for gait training.  Follow-Up Instructions: Return in about 2 months (around 05/25/2021).   Ortho Exam  Patient is alert, oriented, no adenopathy, well-dressed, normal affect, normal respiratory effort. Examination patient's residual limb is well consolidated.  There is no redness no cellulitis no signs of infection.  Patient is a new right transtibial  amputee.  Patient's current comorbidities are not expected to impact the ability to function with the prescribed prosthesis. Patient verbally communicates a strong desire to use a prosthesis. Patient currently requires mobility aids to ambulate without a prosthesis.  Expects not to use mobility aids with a new prosthesis.  Patient is a K3 level ambulator that spends a lot of time walking around on uneven terrain over obstacles, up and down stairs, and ambulates with a variable cadence.     Imaging: No results found. No images are attached to the encounter.  Labs: Lab Results  Component Value Date   HGBA1C 10.5 (H) 01/04/2021   ESRSEDRATE 129 (H) 01/04/2021   CRP 19.3 (H)  01/04/2021   LABURIC 7.8 01/07/2021   REPTSTATUS 01/16/2021 FINAL 01/11/2021   REPTSTATUS 01/16/2021 FINAL 01/11/2021   GRAMSTAIN NO WBC SEEN RARE GRAM POSITIVE COCCI  01/06/2021   CULT  01/11/2021    NO GROWTH 5 DAYS Performed at [facility], [place] [address], [place], [place] [address]    CULT  01/11/2021    NO GROWTH 5 DAYS Performed at [facility] [address], [place],  [address]    LABORGA CITROBACTER KOSERI 01/04/2021     Lab Results  Component Value Date   ALBUMIN 2.1 (L) 01/28/2021   ALBUMIN 2.1 (L) 01/27/2021   ALBUMIN 2.3 (L) 01/26/2021    Lab Results  Component Value Date   MG 1.8 01/13/2021   MG 1.6 (L) 01/11/2021   MG 1.7 01/10/2021   No results found for: VD25OH  No results found for: PREALBUMIN CBC EXTENDED Latest Ref Rng & Units 03/24/2021 01/25/2021 01/21/2021  WBC 4.0 - 10.5 K/uL 10.1 6.1 -  RBC 4.22 - 5.81 MIL/uL 4.24 2.43(L) -  HGB 13.0 - 17.0 g/dL 13.1 7.7(L) 8.3(L)  HCT 39.0 - 52.0 % 37.7(L) 22.8(L) 23.7(L)  PLT 150 - 400 K/uL 227 182 -  NEUTROABS 1.7 - 7.7 K/uL - - -  LYMPHSABS 0.7 - 4.0 K/uL - - -     There is no  height or weight on file to calculate BMI.  Orders:  No orders of the defined types were placed in this encounter.  No orders of the defined types were placed in this encounter.    Procedures: No procedures performed  Clinical Data: No additional findings.  ROS:  All other systems negative, except as noted in the HPI. Review of Systems  Objective: Vital Signs: There were no vitals taken for this visit.  Specialty Comments:  No specialty comments available.  PMFS History: Patient Active Problem List   Diagnosis Date Noted  . Bradycardia   . Right below-knee amputee ([place]) 01/16/2021  . Acute osteomyelitis of right calcaneus ([place])   . Severe protein-calorie malnutrition ([place])   . AKI (acute kidney injury) ([place]) 01/04/2021  . Anemia 01/04/2021  . History of transmetatarsal amputation of right  foot ([place]) 06/11/2019  . Neuropathic pain of foot, right 06/11/2019  . Memory difficulties 01/18/2019  . Psychophysiological insomnia 01/18/2019  . Hyperlipidemia associated with type 2 diabetes mellitus ([place]) 12/13/2018  . Hypertension associated with type 2 diabetes mellitus ([place]) 12/13/2018  . Partial nontraumatic amputation of right foot ([place]) 12/13/2018  . Osteomyelitis of right foot ([place]) 12/04/2018  . Hypertensive urgency 12/04/2018  . Abnormal LFTs 12/04/2018  . Hyponatremia 12/04/2018  . Osteomyelitis ([place]) 12/04/2018  . Hypertension, essential, benign 08/20/2017  . Hypokalemia 08/20/2017  . 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 Lumbar post-laminectomy syndrome 09/16/2014  . Diabetic neuropathy ([place]) 04/04/2013  . Postlaminectomy syndrome, lumbar region 05/18/2012  . Uncontrolled diabetes mellitus with diabetic nephropathy ([place]) 05/18/2012   Past Medical History:  Diagnosis Date  . Back pain   . Diabetes mellitus   . Headache(784.0)    general  . Hyperlipidemia   . Hypertension   . Osteomyelitis ([place]) 11/2018   RIGHT FOOT    Family History  Problem Relation Age of Onset  . Cancer Mother   . Heart disease Father     Past Surgical History:  Procedure Laterality Date  . AMPUTATION Right 09/18/2013   Procedure: Amputation Right Great Toe at MTP;  Surgeon: Newt Minion, MD;  Location: [place];  Service: Orthopedics;  Laterality: Right;  Amputation Right Great Toe at MTP  . AMPUTATION Right 12/05/2018   Procedure: PARTIAL AMPUTATION FIRST RAY RIGHT FOOT;  Surgeon: Edrick Kins, DPM;  Location: [place];  Service: Podiatry;  Laterality: Right;  . AMPUTATION Right 01/11/2021   Procedure: AMPUTATION BELOW KNEE;  Surgeon: Newt Minion, MD;  Location: [place];  Service: Orthopedics;  Laterality: Right;  . APPLICATION OF WOUND VAC Right 01/11/2021   Procedure: APPLICATION OF WOUND  VAC;  Surgeon: Newt Minion, MD;  Location: [place];  Service: Orthopedics;  Laterality: Right;  . BONE BIOPSY Right 01/06/2021   Procedure: BONE BIOPSY;  Surgeon: Edrick Kins, DPM;  [facility];  Service: Podiatry;  Laterality: Right;  . CARDIAC CATHETERIZATION  ?1990  . INCISION AND DRAINAGE Right 01/06/2021   Procedure: INCISION AND DRAINAGE, ANTIBIOTIC BEAD PLACEMENT;  Surgeon: Edrick Kins, DPM;  [facility];  Service: Podiatry;  Laterality: Right;  . SPINE SURGERY     Social History   Occupational History  . Not on file  Tobacco Use  . Smoking status: Current Every Day Smoker    Packs/day: 1.00    Years: 40.00    Pack years: 40.00    Types: Cigarettes  . Smokeless tobacco: Never  Used  Vaping Use  . Vaping Use: Former  Substance and Sexual Activity  . Alcohol use: Not Currently  . Drug use: Not Currently    Comment: past use of marijuana  . Sexual activity: Not on file

## 2021-04-05 LAB — SURGICAL PATHOLOGY

## 2021-04-07 ENCOUNTER — Encounter (HOSPITAL_COMMUNITY): Payer: Self-pay

## 2021-04-12 ENCOUNTER — Emergency Department (HOSPITAL_COMMUNITY): Payer: BC Managed Care – PPO

## 2021-04-12 ENCOUNTER — Inpatient Hospital Stay (HOSPITAL_COMMUNITY): Payer: BC Managed Care – PPO

## 2021-04-12 ENCOUNTER — Encounter (HOSPITAL_COMMUNITY): Payer: Self-pay

## 2021-04-12 ENCOUNTER — Inpatient Hospital Stay (HOSPITAL_COMMUNITY)
Admission: EM | Admit: 2021-04-12 | Discharge: 2021-04-21 | DRG: 871 | Disposition: E | Payer: BC Managed Care – PPO | Attending: Internal Medicine | Admitting: Internal Medicine

## 2021-04-12 DIAGNOSIS — Z978 Presence of other specified devices: Secondary | ICD-10-CM

## 2021-04-12 DIAGNOSIS — Z89519 Acquired absence of unspecified leg below knee: Secondary | ICD-10-CM

## 2021-04-12 DIAGNOSIS — Z794 Long term (current) use of insulin: Secondary | ICD-10-CM | POA: Diagnosis not present

## 2021-04-12 DIAGNOSIS — Z79899 Other long term (current) drug therapy: Secondary | ICD-10-CM

## 2021-04-12 DIAGNOSIS — Z888 Allergy status to other drugs, medicaments and biological substances status: Secondary | ICD-10-CM

## 2021-04-12 DIAGNOSIS — J80 Acute respiratory distress syndrome: Secondary | ICD-10-CM | POA: Diagnosis present

## 2021-04-12 DIAGNOSIS — Z885 Allergy status to narcotic agent status: Secondary | ICD-10-CM

## 2021-04-12 DIAGNOSIS — R579 Shock, unspecified: Secondary | ICD-10-CM

## 2021-04-12 DIAGNOSIS — Z8249 Family history of ischemic heart disease and other diseases of the circulatory system: Secondary | ICD-10-CM

## 2021-04-12 DIAGNOSIS — J440 Chronic obstructive pulmonary disease with acute lower respiratory infection: Secondary | ICD-10-CM | POA: Diagnosis present

## 2021-04-12 DIAGNOSIS — A419 Sepsis, unspecified organism: Secondary | ICD-10-CM | POA: Diagnosis not present

## 2021-04-12 DIAGNOSIS — J9601 Acute respiratory failure with hypoxia: Secondary | ICD-10-CM | POA: Diagnosis not present

## 2021-04-12 DIAGNOSIS — R570 Cardiogenic shock: Secondary | ICD-10-CM | POA: Diagnosis not present

## 2021-04-12 DIAGNOSIS — Z66 Do not resuscitate: Secondary | ICD-10-CM | POA: Diagnosis not present

## 2021-04-12 DIAGNOSIS — Z515 Encounter for palliative care: Secondary | ICD-10-CM | POA: Diagnosis not present

## 2021-04-12 DIAGNOSIS — E1169 Type 2 diabetes mellitus with other specified complication: Secondary | ICD-10-CM | POA: Diagnosis present

## 2021-04-12 DIAGNOSIS — I5082 Biventricular heart failure: Secondary | ICD-10-CM | POA: Diagnosis present

## 2021-04-12 DIAGNOSIS — E1122 Type 2 diabetes mellitus with diabetic chronic kidney disease: Secondary | ICD-10-CM | POA: Diagnosis present

## 2021-04-12 DIAGNOSIS — I130 Hypertensive heart and chronic kidney disease with heart failure and stage 1 through stage 4 chronic kidney disease, or unspecified chronic kidney disease: Secondary | ICD-10-CM | POA: Diagnosis present

## 2021-04-12 DIAGNOSIS — E861 Hypovolemia: Secondary | ICD-10-CM | POA: Diagnosis present

## 2021-04-12 DIAGNOSIS — N184 Chronic kidney disease, stage 4 (severe): Secondary | ICD-10-CM | POA: Diagnosis present

## 2021-04-12 DIAGNOSIS — Z720 Tobacco use: Secondary | ICD-10-CM | POA: Diagnosis not present

## 2021-04-12 DIAGNOSIS — A4189 Other specified sepsis: Principal | ICD-10-CM | POA: Diagnosis present

## 2021-04-12 DIAGNOSIS — N179 Acute kidney failure, unspecified: Secondary | ICD-10-CM | POA: Diagnosis present

## 2021-04-12 DIAGNOSIS — R6521 Severe sepsis with septic shock: Secondary | ICD-10-CM | POA: Diagnosis present

## 2021-04-12 DIAGNOSIS — F17210 Nicotine dependence, cigarettes, uncomplicated: Secondary | ICD-10-CM | POA: Diagnosis present

## 2021-04-12 DIAGNOSIS — I4891 Unspecified atrial fibrillation: Secondary | ICD-10-CM | POA: Diagnosis not present

## 2021-04-12 DIAGNOSIS — Z4659 Encounter for fitting and adjustment of other gastrointestinal appliance and device: Secondary | ICD-10-CM

## 2021-04-12 DIAGNOSIS — Z7189 Other specified counseling: Secondary | ICD-10-CM | POA: Diagnosis not present

## 2021-04-12 DIAGNOSIS — U071 COVID-19: Secondary | ICD-10-CM | POA: Diagnosis present

## 2021-04-12 DIAGNOSIS — D61818 Other pancytopenia: Secondary | ICD-10-CM | POA: Diagnosis present

## 2021-04-12 DIAGNOSIS — M869 Osteomyelitis, unspecified: Secondary | ICD-10-CM | POA: Diagnosis present

## 2021-04-12 DIAGNOSIS — E871 Hypo-osmolality and hyponatremia: Secondary | ICD-10-CM | POA: Diagnosis present

## 2021-04-12 DIAGNOSIS — I469 Cardiac arrest, cause unspecified: Secondary | ICD-10-CM | POA: Diagnosis not present

## 2021-04-12 DIAGNOSIS — E785 Hyperlipidemia, unspecified: Secondary | ICD-10-CM | POA: Diagnosis present

## 2021-04-12 DIAGNOSIS — J1282 Pneumonia due to coronavirus disease 2019: Secondary | ICD-10-CM | POA: Diagnosis present

## 2021-04-12 DIAGNOSIS — Z7982 Long term (current) use of aspirin: Secondary | ICD-10-CM

## 2021-04-12 DIAGNOSIS — Z881 Allergy status to other antibiotic agents status: Secondary | ICD-10-CM

## 2021-04-12 DIAGNOSIS — I2609 Other pulmonary embolism with acute cor pulmonale: Secondary | ICD-10-CM | POA: Diagnosis not present

## 2021-04-12 DIAGNOSIS — E1110 Type 2 diabetes mellitus with ketoacidosis without coma: Secondary | ICD-10-CM | POA: Diagnosis present

## 2021-04-12 DIAGNOSIS — R0602 Shortness of breath: Secondary | ICD-10-CM | POA: Diagnosis present

## 2021-04-12 DIAGNOSIS — G9341 Metabolic encephalopathy: Secondary | ICD-10-CM | POA: Diagnosis not present

## 2021-04-12 DIAGNOSIS — E876 Hypokalemia: Secondary | ICD-10-CM | POA: Diagnosis present

## 2021-04-12 DIAGNOSIS — Z89511 Acquired absence of right leg below knee: Secondary | ICD-10-CM

## 2021-04-12 DIAGNOSIS — Z809 Family history of malignant neoplasm, unspecified: Secondary | ICD-10-CM

## 2021-04-12 DIAGNOSIS — N171 Acute kidney failure with acute cortical necrosis: Secondary | ICD-10-CM | POA: Diagnosis not present

## 2021-04-12 LAB — COMPREHENSIVE METABOLIC PANEL
ALT: 19 U/L (ref 0–44)
AST: 18 U/L (ref 15–41)
Albumin: 2.1 g/dL — ABNORMAL LOW (ref 3.5–5.0)
Alkaline Phosphatase: 68 U/L (ref 38–126)
Anion gap: 11 (ref 5–15)
BUN: 57 mg/dL — ABNORMAL HIGH (ref 8–23)
CO2: 20 mmol/L — ABNORMAL LOW (ref 22–32)
Calcium: 7.7 mg/dL — ABNORMAL LOW (ref 8.9–10.3)
Chloride: 101 mmol/L (ref 98–111)
Creatinine, Ser: 3.35 mg/dL — ABNORMAL HIGH (ref 0.61–1.24)
GFR, Estimated: 20 mL/min — ABNORMAL LOW (ref >60)
Glucose, Bld: 412 mg/dL — ABNORMAL HIGH (ref 70–99)
Potassium: 4.1 mmol/L (ref 3.5–5.1)
Sodium: 132 mmol/L — ABNORMAL LOW (ref 135–145)
Total Bilirubin: 0.6 mg/dL (ref 0.3–1.2)
Total Protein: 5.5 g/dL — ABNORMAL LOW (ref 6.5–8.1)

## 2021-04-12 LAB — LACTATE DEHYDROGENASE: LDH: 200 U/L — ABNORMAL HIGH (ref 98–192)

## 2021-04-12 LAB — MAGNESIUM: Magnesium: 1.5 mg/dL — ABNORMAL LOW (ref 1.7–2.4)

## 2021-04-12 LAB — BLOOD GAS, ARTERIAL
Acid-base deficit: 5.3 mmol/L — ABNORMAL HIGH (ref 0.0–2.0)
Acid-base deficit: 14.1 mmol/L — ABNORMAL HIGH (ref 0.0–2.0)
Acid-base deficit: 6.8 mmol/L — ABNORMAL HIGH (ref 0.0–2.0)
Bicarbonate: 17.8 mmol/L — ABNORMAL LOW (ref 20.0–28.0)
Bicarbonate: 18 mmol/L — ABNORMAL LOW (ref 20.0–28.0)
Bicarbonate: 21.2 mmol/L (ref 20.0–28.0)
FIO2: 100
O2 Saturation: 94.4 %
O2 Saturation: 98.3 %
O2 Saturation: 95.4 %
Patient temperature: 98.6
Patient temperature: 98.6
Patient temperature: 98.6
pCO2 arterial: 29.7 mmHg — ABNORMAL LOW (ref 32.0–48.0)
pCO2 arterial: 59.1 mmHg — ABNORMAL HIGH (ref 32.0–48.0)
pCO2 arterial: 54.3 mmHg — ABNORMAL HIGH (ref 32.0–48.0)
pH, Arterial: 7.397 (ref 7.350–7.450)
pH, Arterial: 7.11 — CL (ref 7.350–7.450)
pH, Arterial: 7.215 — ABNORMAL LOW (ref 7.350–7.450)
pO2, Arterial: 74.5 mmHg — ABNORMAL LOW (ref 83.0–108.0)
pO2, Arterial: 219 mmHg — ABNORMAL HIGH (ref 83.0–108.0)
pO2, Arterial: 90 mmHg (ref 83.0–108.0)

## 2021-04-12 LAB — GLUCOSE, CAPILLARY
Glucose-Capillary: 423 mg/dL — ABNORMAL HIGH (ref 70–99)
Glucose-Capillary: 513 mg/dL (ref 70–99)
Glucose-Capillary: 402 mg/dL — ABNORMAL HIGH (ref 70–99)
Glucose-Capillary: 415 mg/dL — ABNORMAL HIGH (ref 70–99)
Glucose-Capillary: 433 mg/dL — ABNORMAL HIGH (ref 70–99)
Glucose-Capillary: 349 mg/dL — ABNORMAL HIGH (ref 70–99)
Glucose-Capillary: 219 mg/dL — ABNORMAL HIGH (ref 70–99)

## 2021-04-12 LAB — CBC WITH DIFFERENTIAL/PLATELET
Abs Immature Granulocytes: 0.02 10*3/uL (ref 0.00–0.07)
Basophils Absolute: 0 10*3/uL (ref 0.0–0.1)
Basophils Relative: 0 %
Eosinophils Absolute: 0 10*3/uL (ref 0.0–0.5)
Eosinophils Relative: 0 %
HCT: 37.1 % — ABNORMAL LOW (ref 39.0–52.0)
Hemoglobin: 12.4 g/dL — ABNORMAL LOW (ref 13.0–17.0)
Immature Granulocytes: 1 %
Lymphocytes Relative: 6 %
Lymphs Abs: 0.2 10*3/uL — ABNORMAL LOW (ref 0.7–4.0)
MCH: 30.7 pg (ref 26.0–34.0)
MCHC: 33.4 g/dL (ref 30.0–36.0)
MCV: 91.8 fL (ref 80.0–100.0)
Monocytes Absolute: 0.3 10*3/uL (ref 0.1–1.0)
Monocytes Relative: 6 %
Neutro Abs: 3.6 10*3/uL (ref 1.7–7.7)
Neutrophils Relative %: 87 %
Platelets: 107 10*3/uL — ABNORMAL LOW (ref 150–400)
RBC: 4.04 MIL/uL — ABNORMAL LOW (ref 4.22–5.81)
RDW: 13.3 % (ref 11.5–15.5)
WBC: 4.2 10*3/uL (ref 4.0–10.5)
nRBC: 0 % (ref 0.0–0.2)

## 2021-04-12 LAB — BASIC METABOLIC PANEL
Anion gap: 13 (ref 5–15)
BUN: 59 mg/dL — ABNORMAL HIGH (ref 8–23)
CO2: 20 mmol/L — ABNORMAL LOW (ref 22–32)
Calcium: 7.8 mg/dL — ABNORMAL LOW (ref 8.9–10.3)
Chloride: 100 mmol/L (ref 98–111)
Creatinine, Ser: 3.61 mg/dL — ABNORMAL HIGH (ref 0.61–1.24)
GFR, Estimated: 18 mL/min — ABNORMAL LOW (ref >60)
Glucose, Bld: 480 mg/dL — ABNORMAL HIGH (ref 70–99)
Potassium: 4.2 mmol/L (ref 3.5–5.1)
Sodium: 133 mmol/L — ABNORMAL LOW (ref 135–145)

## 2021-04-12 LAB — PHOSPHORUS: Phosphorus: 3.3 mg/dL (ref 2.5–4.6)

## 2021-04-12 LAB — FERRITIN: Ferritin: 1051 ng/mL — ABNORMAL HIGH (ref 24–336)

## 2021-04-12 LAB — BETA-HYDROXYBUTYRIC ACID: Beta-Hydroxybutyric Acid: 0.57 mmol/L — ABNORMAL HIGH (ref 0.05–0.27)

## 2021-04-12 LAB — RESP PANEL BY RT-PCR (FLU A&B, COVID) ARPGX2
Influenza A by PCR: NEGATIVE
Influenza B by PCR: NEGATIVE
SARS Coronavirus 2 by RT PCR: POSITIVE — AB

## 2021-04-12 LAB — CBG MONITORING, ED
Glucose-Capillary: 374 mg/dL — ABNORMAL HIGH (ref 70–99)
Glucose-Capillary: 463 mg/dL — ABNORMAL HIGH (ref 70–99)

## 2021-04-12 LAB — LACTIC ACID, PLASMA
Lactic Acid, Venous: 5.8 mmol/L (ref 0.5–1.9)
Lactic Acid, Venous: 4.2 mmol/L (ref 0.5–1.9)
Lactic Acid, Venous: 4.8 mmol/L (ref 0.5–1.9)
Lactic Acid, Venous: 5.8 mmol/L (ref 0.5–1.9)

## 2021-04-12 LAB — C-REACTIVE PROTEIN: CRP: 27.6 mg/dL — ABNORMAL HIGH (ref <1.0)

## 2021-04-12 LAB — D-DIMER, QUANTITATIVE: D-Dimer, Quant: 17.04 ug/mL-FEU — ABNORMAL HIGH (ref 0.00–0.50)

## 2021-04-12 LAB — MRSA PCR SCREENING: MRSA by PCR: NEGATIVE

## 2021-04-12 LAB — TRIGLYCERIDES: Triglycerides: 158 mg/dL — ABNORMAL HIGH (ref <150)

## 2021-04-12 LAB — PROCALCITONIN: Procalcitonin: 49.3 ng/mL

## 2021-04-12 LAB — FIBRINOGEN: Fibrinogen: >800 mg/dL — ABNORMAL HIGH (ref 210–475)

## 2021-04-12 MED ORDER — FENTANYL 2500MCG IN NS 250ML (10MCG/ML) PREMIX INFUSION
50.0000 ug/h | INTRAVENOUS | Status: DC
  Administered 2021-04-12 – 2021-04-13 (×2): 50 ug/h via INTRAVENOUS
  Filled 2021-04-12: qty 250

## 2021-04-12 MED ORDER — SODIUM CHLORIDE 0.9 % IV SOLN
100.0000 mg | Freq: Every day | INTRAVENOUS | Status: DC
  Administered 2021-04-13: 100 mg via INTRAVENOUS
  Filled 2021-04-12: qty 20

## 2021-04-12 MED ORDER — LACTATED RINGERS IV SOLN: INTRAVENOUS | Status: DC

## 2021-04-12 MED ORDER — STERILE WATER FOR INJECTION IV SOLN
INTRAVENOUS | Status: DC
  Filled 2021-04-12: qty 1000
  Filled 2021-04-12: qty 150

## 2021-04-12 MED ORDER — MIDAZOLAM HCL 2 MG/2ML IJ SOLN: 2.0000 mg | INTRAMUSCULAR | Status: DC | PRN

## 2021-04-12 MED ORDER — NOREPINEPHRINE 4 MG/250ML-% IV SOLN
0.0000 ug/min | INTRAVENOUS | Status: DC
  Administered 2021-04-12: 33 ug/min via INTRAVENOUS
  Administered 2021-04-12: 14 ug/min via INTRAVENOUS
  Filled 2021-04-12 (×3): qty 250

## 2021-04-12 MED ORDER — ETOMIDATE 2 MG/ML IV SOLN: 20.0000 mg | Freq: Once | INTRAVENOUS | Status: AC

## 2021-04-12 MED ORDER — POLYETHYLENE GLYCOL 3350 17 G PO PACK: 17.0000 g | PACK | Freq: Every day | ORAL | Status: DC | PRN

## 2021-04-12 MED ORDER — HEPARIN (PORCINE) 25000 UT/250ML-% IV SOLN
1300.0000 [IU]/h | INTRAVENOUS | Status: DC
  Administered 2021-04-12: 1450 [IU]/h via INTRAVENOUS
  Administered 2021-04-13: 1500 [IU]/h via INTRAVENOUS
  Filled 2021-04-12: qty 250

## 2021-04-12 MED ORDER — SODIUM BICARBONATE 8.4 % IV SOLN
INTRAVENOUS | Status: AC
  Filled 2021-04-12: qty 50

## 2021-04-12 MED ORDER — CHLORHEXIDINE GLUCONATE CLOTH 2 % EX PADS
6.0000 | MEDICATED_PAD | Freq: Every day | CUTANEOUS | Status: DC
  Administered 2021-04-12: 6 via TOPICAL

## 2021-04-12 MED ORDER — SODIUM CHLORIDE 0.9 % IV SOLN
500.0000 mg | INTRAVENOUS | Status: DC
  Filled 2021-04-12: qty 500

## 2021-04-12 MED ORDER — OXYTOCIN 10 UNIT/ML IJ SOLN: 10.0000 [IU] | Freq: Once | INTRAMUSCULAR | Status: DC

## 2021-04-12 MED ORDER — ORAL CARE MOUTH RINSE: 15.0000 mL | Freq: Two times a day (BID) | OROMUCOSAL | Status: DC

## 2021-04-12 MED ORDER — PHENYLEPHRINE 40 MCG/ML (10ML) SYRINGE FOR IV PUSH (FOR BLOOD PRESSURE SUPPORT)
PREFILLED_SYRINGE | INTRAVENOUS | Status: AC
  Administered 2021-04-12: 80 ug
  Filled 2021-04-12: qty 10

## 2021-04-12 MED ORDER — SODIUM CHLORIDE 0.9 % IV SOLN
200.0000 mg | Freq: Once | INTRAVENOUS | Status: AC
  Administered 2021-04-12: 200 mg via INTRAVENOUS
  Filled 2021-04-12: qty 40

## 2021-04-12 MED ORDER — SODIUM BICARBONATE 8.4 % IV SOLN
100.0000 meq | Freq: Once | INTRAVENOUS | Status: AC
  Administered 2021-04-12: 100 meq via INTRAVENOUS

## 2021-04-12 MED ORDER — CHLORHEXIDINE GLUCONATE 0.12 % MT SOLN: 15.0000 mL | Freq: Two times a day (BID) | OROMUCOSAL | Status: DC

## 2021-04-12 MED ORDER — FENTANYL CITRATE (PF) 100 MCG/2ML IJ SOLN
50.0000 ug | Freq: Once | INTRAMUSCULAR | Status: AC
  Administered 2021-04-12: 50 ug via INTRAVENOUS
  Filled 2021-04-12: qty 2

## 2021-04-12 MED ORDER — DEXTROSE IN LACTATED RINGERS 5 % IV SOLN: INTRAVENOUS | Status: DC

## 2021-04-12 MED ORDER — POLYETHYLENE GLYCOL 3350 17 G PO PACK: 17.0000 g | PACK | Freq: Every day | ORAL | Status: DC

## 2021-04-12 MED ORDER — HEPARIN (PORCINE) 25000 UT/250ML-% IV SOLN
1550.0000 [IU]/h | INTRAVENOUS | Status: DC
  Filled 2021-04-12: qty 250

## 2021-04-12 MED ORDER — VANCOMYCIN VARIABLE DOSE PER UNSTABLE RENAL FUNCTION (PHARMACIST DOSING): Status: DC

## 2021-04-12 MED ORDER — ORAL CARE MOUTH RINSE
15.0000 mL | OROMUCOSAL | Status: DC
  Administered 2021-04-12 – 2021-04-13 (×7): 15 mL via OROMUCOSAL

## 2021-04-12 MED ORDER — LACTATED RINGERS IV BOLUS: 1000.0000 mL | Freq: Once | INTRAVENOUS | Status: DC

## 2021-04-12 MED ORDER — ROSUVASTATIN CALCIUM 20 MG PO TABS: 20.0000 mg | ORAL_TABLET | Freq: Every day | ORAL | Status: DC

## 2021-04-12 MED ORDER — DOCUSATE SODIUM 50 MG/5ML PO LIQD
100.0000 mg | Freq: Two times a day (BID) | ORAL | Status: DC
  Administered 2021-04-13: 100 mg
  Filled 2021-04-12: qty 10

## 2021-04-12 MED ORDER — SODIUM CHLORIDE 0.9 % IV SOLN: 2.0000 g | INTRAVENOUS | Status: DC

## 2021-04-12 MED ORDER — FENTANYL CITRATE (PF) 100 MCG/2ML IJ SOLN
INTRAMUSCULAR | Status: AC
  Administered 2021-04-12: 100 ug via INTRAVENOUS
  Filled 2021-04-12: qty 2

## 2021-04-12 MED ORDER — NOREPINEPHRINE 4 MG/250ML-% IV SOLN
INTRAVENOUS | Status: AC
  Administered 2021-04-12: 20 ug/min via INTRAVENOUS
  Filled 2021-04-12: qty 250

## 2021-04-12 MED ORDER — ROCURONIUM BROMIDE 10 MG/ML (PF) SYRINGE
PREFILLED_SYRINGE | INTRAVENOUS | Status: AC
  Administered 2021-04-12: 60 mg via INTRAVENOUS
  Filled 2021-04-12: qty 10

## 2021-04-12 MED ORDER — SODIUM CHLORIDE 0.9 % IV SOLN
1.0000 g | Freq: Once | INTRAVENOUS | Status: AC
  Administered 2021-04-12: 1 g via INTRAVENOUS
  Filled 2021-04-12: qty 10

## 2021-04-12 MED ORDER — PHENYLEPHRINE HCL-NACL 10-0.9 MG/250ML-% IV SOLN
INTRAVENOUS | Status: AC
  Filled 2021-04-12: qty 250

## 2021-04-12 MED ORDER — FENTANYL CITRATE (PF) 100 MCG/2ML IJ SOLN: 100.0000 ug | Freq: Once | INTRAMUSCULAR | Status: AC

## 2021-04-12 MED ORDER — PANTOPRAZOLE SODIUM 40 MG IV SOLR
40.0000 mg | INTRAVENOUS | Status: DC
  Administered 2021-04-12: 40 mg via INTRAVENOUS
  Filled 2021-04-12: qty 40

## 2021-04-12 MED ORDER — DEXAMETHASONE SODIUM PHOSPHATE 10 MG/ML IJ SOLN
6.0000 mg | Freq: Once | INTRAMUSCULAR | Status: AC
  Administered 2021-04-12: 6 mg via INTRAVENOUS
  Filled 2021-04-12: qty 1

## 2021-04-12 MED ORDER — VANCOMYCIN HCL 1500 MG/300ML IV SOLN
1500.0000 mg | Freq: Once | INTRAVENOUS | Status: AC
  Administered 2021-04-12: 1500 mg via INTRAVENOUS
  Filled 2021-04-12: qty 300

## 2021-04-12 MED ORDER — MIDAZOLAM HCL 2 MG/2ML IJ SOLN: 2.0000 mg | Freq: Once | INTRAMUSCULAR | Status: AC

## 2021-04-12 MED ORDER — HEPARIN BOLUS VIA INFUSION
3000.0000 [IU] | Freq: Once | INTRAVENOUS | Status: DC
  Filled 2021-04-12: qty 3000

## 2021-04-12 MED ORDER — CHLORHEXIDINE GLUCONATE 0.12% ORAL RINSE (MEDLINE KIT)
15.0000 mL | Freq: Two times a day (BID) | OROMUCOSAL | Status: DC
  Administered 2021-04-12 – 2021-04-13 (×2): 15 mL via OROMUCOSAL

## 2021-04-12 MED ORDER — VANCOMYCIN HCL 2000 MG/400ML IV SOLN
2000.0000 mg | Freq: Once | INTRAVENOUS | Status: DC
  Filled 2021-04-12: qty 400

## 2021-04-12 MED ORDER — ROCURONIUM BROMIDE 50 MG/5ML IV SOLN: 60.0000 mg | Freq: Once | INTRAVENOUS | Status: AC

## 2021-04-12 MED ORDER — SODIUM CHLORIDE 0.9 % IV SOLN
500.0000 mg | INTRAVENOUS | Status: DC
  Administered 2021-04-12: 500 mg via INTRAVENOUS
  Filled 2021-04-12: qty 500

## 2021-04-12 MED ORDER — DOCUSATE SODIUM 100 MG PO CAPS: 100.0000 mg | ORAL_CAPSULE | Freq: Two times a day (BID) | ORAL | Status: DC | PRN

## 2021-04-12 MED ORDER — HEPARIN BOLUS VIA INFUSION
2500.0000 [IU] | Freq: Once | INTRAVENOUS | Status: AC
  Administered 2021-04-12: 2500 [IU] via INTRAVENOUS
  Filled 2021-04-12: qty 2500

## 2021-04-12 MED ORDER — INSULIN REGULAR(HUMAN) IN NACL 100-0.9 UT/100ML-% IV SOLN
INTRAVENOUS | Status: DC
  Administered 2021-04-12: 12 [IU]/h via INTRAVENOUS
  Administered 2021-04-12: 21 [IU]/h via INTRAVENOUS
  Filled 2021-04-12 (×2): qty 100

## 2021-04-12 MED ORDER — MIDAZOLAM HCL 2 MG/2ML IJ SOLN
INTRAMUSCULAR | Status: AC
  Administered 2021-04-12: 2 mg via INTRAVENOUS
  Filled 2021-04-12: qty 2

## 2021-04-12 MED ORDER — ETOMIDATE 2 MG/ML IV SOLN
INTRAVENOUS | Status: AC
  Administered 2021-04-12: 20 mg via INTRAVENOUS
  Filled 2021-04-12: qty 20

## 2021-04-12 MED ORDER — SODIUM CHLORIDE 0.9 % IV SOLN
500.0000 mg | Freq: Once | INTRAVENOUS | Status: AC
  Administered 2021-04-12: 500 mg via INTRAVENOUS
  Filled 2021-04-12: qty 500

## 2021-04-12 MED ORDER — FENTANYL BOLUS VIA INFUSION
50.0000 ug | INTRAVENOUS | Status: DC | PRN
  Filled 2021-04-12: qty 100

## 2021-04-12 MED ORDER — DEXTROSE 50 % IV SOLN: 0.0000 mL | INTRAVENOUS | Status: DC | PRN

## 2021-04-12 MED ORDER — METHYLPREDNISOLONE SODIUM SUCC 40 MG IJ SOLR
40.0000 mg | Freq: Two times a day (BID) | INTRAMUSCULAR | Status: DC
  Administered 2021-04-12: 40 mg via INTRAVENOUS
  Filled 2021-04-12: qty 1

## 2021-04-12 MED ORDER — FENTANYL CITRATE (PF) 100 MCG/2ML IJ SOLN
100.0000 ug | Freq: Once | INTRAMUSCULAR | Status: AC
  Administered 2021-04-12: 100 ug via INTRAVENOUS
  Filled 2021-04-12: qty 2

## 2021-04-12 MED ORDER — SODIUM CHLORIDE 0.9 % IV SOLN
INTRAVENOUS | Status: DC | PRN
  Administered 2021-04-12: 250 mL via INTRAVENOUS

## 2021-04-12 NOTE — Progress Notes (Signed)
[facility] Progress Note Patient Name: JAKENDRICK ANTAL DOB: 1956-08-25 MRN: [id number]   Date of Service  04/04/2021  HPI/Events of Note  Multiple issues: 1. Lactic Acid = 4.2 --> 4.8 --> 5.8 and 2. ABG on 70%/PRVC 28/TV 490/P 8 = 7.11/59.1/219/18. Not able to increase PRVC rate higher than 28 d/t air trapping. Hgb = 12.4.   eICU Interventions  Plan: 1. Monitor CVP now and Q 4 hours. 2. NaHCO3 100 meq IV now. 3. NaHCO3 IV infusion to run at 125 mL/hour. 4. Repeat ABG at 12 midnight.     Intervention Category Major Interventions: Acid-Base disturbance - evaluation and management;Respiratory failure - evaluation and management  Lysle Dingwall 03/24/2021, 9:19 PM

## 2021-04-12 NOTE — Consult Note (Addendum)
NAME:  Fernando Anthony, MRN:  [id number], DOB:  1955/12/28, LOS: 0 ADMISSION DATE:  04/10/2021, CONSULTATION DATE:  03/31/2021 REFERRING MD:  Dr. Kathrynn Humble, CHIEF COMPLAINT: shortness of breath   History of Present Illness:  Patient is a 65 yo male with pertinent PMH of positive home covid test, smoking history, IDDM, HTN, HLD, Osteomyelitis s/p amputation 12/2020 right leg below knee presents to [facility] on 5/23 with increasing shortness of breath with hypoxia.   Patient states sob started on 6 days of cough and weakness. On 5/20, his wife and him took home COVID-19 test which were both positive. His cough and work of breathing worsened the past few days. He has not had an appetite and hasn't eaten/drank much since 5/21. He developed worsening SOB and vomiting on 5/23 which brought him to [facility] ED.  Patient present to the ED with RR in 28s and hypoxic. Placed on NRB/HFNC 15 l/m with sats mid 90s. Normal sinus tach on EKG. BP 158/94. ED course labs/tests: Na 132, K 4.1, co2 20, glucose 412; Bun 57, creatinine 3.35; Anion gap 11, albumin 2.1; LDH 200, CRP 27.6, Lactic acid 5.8, procalcitonin 49.3; ABG: 7.39, paco2 29.7, po2 74.5, hco3 17.8; CXR: multifocal pneumonia worse on left.   PCCM consulted for acute hypoxic respiratory failure. Patient switched currently on NRB/HFNC 15 l/m. RR still in high 30s/low 40s. Sats 95% on 100% NRB/15 l/m HFNC. Patient alert and able to communicate but is tired/labored breathing.   Pertinent  Medical History   Past Medical History:  Diagnosis Date  . Back pain   . Diabetes mellitus   . Headache(784.0)    general  . Hyperlipidemia   . Hypertension   . Osteomyelitis ([place]) 11/2018   RIGHT FOOT     Significant Hospital Events: Including procedures, antibiotic start and stop dates in addition to other pertinent events   . 5/23: Patient admitted. Hypoxic on NRB/HFNC 15 l/m with sats in mid 90s and RR in 30s. Receiving steroids and remdesivir. Azithromycin, Ceftriaxone,  and Vanc started.  Interim History / Subjective:  On NRB/HFNC 15 l/m with O2 sats mid 90s RR 30-40s  Objective   Blood pressure (!) 158/94, pulse (!) 109, temperature 97.9 F (36.6 C), temperature source Oral, resp. rate (!) 43, weight 90.7 kg, SpO2 97 %.       No intake or output data in the 24 hours ending 04/16/2021 1521 Filed Weights   04/02/2021 1420  Weight: 90.7 kg    Examination: General: Critically ill appearing 65 yo male in respiratory distress HEENT: MM pink/moist; NRB/HFNC in place Neuro: able to follow commands and answer questions, but wanted to sleep CV: s1s2, normal sinus tach, no m/r/g PULM:  NRB/HFNC 15 l/m with O2 sats in mid 90s. RR 30-40s. Some intercostal muscle use. Able to speak to me in complete sentences. BS dim/crackles bilaterally. GI: soft, bsx4 active  Extremities: warm/dry, no edema; RLE amputation below knee; negative homans sign on left calf Skin: no rashes or lesions appreciated   Labs/imaging that I havepersonally reviewed  (right click and "Reselect all SmartList Selections" daily)   Na 132, K 4.1, co2 20, glucose 412 Bun 57, creatinine 3.35 Anion gap 11, albumin 2.1,  LDH 200, CRP 27.6, Lactic acid 5.8, procalcitonin 49.3 ABG: 7.39, paco2 29.7, po2 74.5, hco3 17.8 CXR: multifocal pneumonia worse on left.   Resolved Hospital Problem list     Assessment & Plan:   Acute hypoxic respiratory failure POA with Covid-19 pneumonia +/- bacterial  super infection vs aspiration pneumonia Patient received 2 Covid vaccines without the booster. Hx of smoking. (47 Pack year history) Plan: - Azithromycin/Ceftriaxone/Vanc to cover for possible bacterial process - Steroids 40 IV Q12 - Remdesivir started 5/23 per pharmacy - On NRB/HFNC; will transition to heated HF o2 for sats >90% - Consider intubating if patient mental status or respiratory status worsens - CXR am - Consider resp cultures if patient requires intubation - Pulmonary toiletry -  Duoneb prn   Possible PE Plan: -Unable to obtain CTA chest to rule out PE due to AKI and respiratory status -Will start heparin prophylactically -ordered dvt ultrasound  Severe Sepsis present on admission Plan: -BC X2 -urinalysis/urine culture/urine strep and legionella antigen pending -continue ABX above -Continue IV fluids -Trend labs, procalcitonin, lactic acid, and cultures  AKI POA: likely related to hypovolemia Plan: -IV LR fluids -avoid nephrotoxic agents and renal dose meds -Trend BMP/UO  Hyperglycemic crisis: DKA vs HHS Hx of insulin dependent DM Likely to worsen with steroids Plan: -Insulin drip started; initiate D50 per protocol -Continue LR fluids -CBG monitoring  Mild anemia Thrombocytopenia Plan: - Trend CBC  Hx of HTN/HLD Plan: -ordered home rosuvastatin -home amlodipine 10 on hold; consider restarting tonight if HTN persist   Best practice (right click and "Reselect all SmartList Selections" daily)  Diet:  NPO Pain/Anxiety/Delirium protocol (if indicated): No VAP protocol (if indicated): Not indicated DVT prophylaxis: Systemic AC GI prophylaxis: PPI Glucose control:  Insulin gtt insulin drip Central venous access:  N/A Arterial line:  N/A Foley:  N/A Mobility:  bed rest  PT consulted: N/A Last date of multidisciplinary goals of care discussion '[]'$  Code Status:  full code Disposition: ICU  Labs   CBC: Recent Labs  Lab 04/18/2021 1230  WBC 4.2  NEUTROABS 3.6  HGB 12.4*  HCT 37.1*  MCV 91.8  PLT 107*    Basic Metabolic Panel: Recent Labs  Lab 04/15/2021 1230  NA 132*  K 4.1  CL 101  CO2 20*  GLUCOSE 412*  BUN 57*  CREATININE 3.35*  CALCIUM 7.7*   GFR: Estimated Creatinine Clearance: 26.2 mL/min (A) (by C-G formula based on SCr of 3.35 mg/dL (H)). Recent Labs  Lab 03/30/2021 1230  PROCALCITON 49.30  WBC 4.2  LATICACIDVEN 5.8*    Liver Function Tests: Recent Labs  Lab 04/18/2021 1230  AST 18  ALT 19  ALKPHOS 68   BILITOT 0.6  PROT 5.5*  ALBUMIN 2.1*   No results for input(s): LIPASE, AMYLASE in the last 168 hours. No results for input(s): AMMONIA in the last 168 hours.  ABG    Component Value Date/Time   PHART 7.397 04/10/2021 1231   PCO2ART 29.7 (L) 04/03/2021 1231   PO2ART 74.5 (L) 03/22/2021 1231   HCO3 17.8 (L) 04/07/2021 1231   ACIDBASEDEF 5.3 (H) 04/10/2021 1231   O2SAT 94.4 04/16/2021 1231     Coagulation Profile: No results for input(s): INR, PROTIME in the last 168 hours.  Cardiac Enzymes: No results for input(s): CKTOTAL, CKMB, CKMBINDEX, TROPONINI in the last 168 hours.  HbA1C: Hgb A1c MFr Bld  Date/Time Value Ref Range Status  01/04/2021 10:53 PM 10.5 (H) 4.8 - 5.6 % Final    Comment:    (NOTE)         Prediabetes: 5.7 - 6.4         Diabetes: >6.4         Glycemic control for adults with diabetes: <7.0     CBG: Recent Labs  Lab 04/04/2021 1233  GLUCAP 374*    Review of Systems:   As per HPI. Patient was sleepy so most information obtained from chart and nurse.  Past Medical History:  He,  has a past medical history of Back pain, Diabetes mellitus, Headache(784.0), Hyperlipidemia, Hypertension, and Osteomyelitis ([place]) (11/2018).   Surgical History:   Past Surgical History:  Procedure Laterality Date  . AMPUTATION Right 09/18/2013   Procedure: Amputation Right Great Toe at MTP;  Surgeon: Newt Minion, MD;  Location: [place];  Service: Orthopedics;  Laterality: Right;  Amputation Right Great Toe at MTP  . AMPUTATION Right 12/05/2018   Procedure: PARTIAL AMPUTATION FIRST RAY RIGHT FOOT;  Surgeon: Edrick Kins, DPM;  Location: [place];  Service: Podiatry;  Laterality: Right;  . AMPUTATION Right 01/11/2021   Procedure: AMPUTATION BELOW KNEE;  Surgeon: Newt Minion, MD;  Location: [place];  Service: Orthopedics;  Laterality: Right;  . APPLICATION OF WOUND VAC Right 01/11/2021   Procedure: APPLICATION OF WOUND VAC;  Surgeon: Newt Minion, MD;  Location: [place];   Service: Orthopedics;  Laterality: Right;  . BONE BIOPSY Right 01/06/2021   Procedure: BONE BIOPSY;  Surgeon: Edrick Kins, DPM;  Location: [facility];  Service: Podiatry;  Laterality: Right;  . CARDIAC CATHETERIZATION  ?1990  . INCISION AND DRAINAGE Right 01/06/2021   Procedure: INCISION AND DRAINAGE, ANTIBIOTIC BEAD PLACEMENT;  Surgeon: Edrick Kins, DPM;  Location: [facility];  Service: Podiatry;  Laterality: Right;  . SPINE SURGERY       Social History:   reports that he has been smoking cigarettes. He has a 40.00 pack-year smoking history. He has never used smokeless tobacco. He reports previous alcohol use. He reports previous drug use.   Family History:  His family history includes Cancer in his mother; Heart disease in his father.   Allergies Allergies  Allergen Reactions  . Bactrim [Sulfamethoxazole-Trimethoprim] Nausea And Vomiting  . Doxycycline Nausea And Vomiting  . Oxycodone     History of addiction   . Vicodin [Hydrocodone-Acetaminophen] Itching     Home Medications  Prior to Admission medications   Medication Sig Start Date End Date Taking? Authorizing Provider  acetaminophen (TYLENOL) 325 MG tablet Take 1-2 tablets (325-650 mg total) by mouth every 6 (six) hours as needed for mild pain (pain score 1-3 or temp > 100.5). 01/16/21  Yes Barb Merino, MD  acetaminophen (TYLENOL) 650 MG CR tablet Take 650 mg by mouth every 8 (eight) hours as needed for pain.   Yes [provider]  amLODipine (NORVASC) 10 MG tablet Take 1 tablet (10 mg total) by mouth daily. 01/27/21  Yes Angiulli, Lavon Paganini, PA-C  aspirin 81 MG tablet Take 81 mg by mouth at bedtime.    Yes [provider]  bismuth subsalicylate (PEPTO BISMOL) 262 MG/15ML suspension Take 30 mLs by mouth every 6 (six) hours as needed (nausea).   Yes [provider]  chlorthalidone (HYGROTON) 25 MG tablet Take 12.5 mg by mouth at bedtime.   Yes [provider]  docusate sodium (COLACE) 100 MG  capsule Take 1 capsule (100 mg total) by mouth 2 (two) times daily. Patient taking differently: Take 100 mg by mouth 2 (two) times daily as needed for mild constipation. 01/27/21  Yes Angiulli, Lavon Paganini, PA-C  FIASP FLEXTOUCH 100 UNIT/ML FlexTouch Pen Inject 12 Units into the skin 3 (three) times daily as needed (high blood sugar). 03/12/21  Yes [provider]  insulin glargine (LANTUS)  100 UNIT/ML Solostar Pen Inject 20 Units into the skin daily. Patient taking differently: Inject 20 Units into the skin at bedtime. 01/27/21  Yes Angiulli, Lavon Paganini, PA-C  methocarbamol (ROBAXIN) 500 MG tablet Take 1 tablet (500 mg total) by mouth every 6 (six) hours as needed for muscle spasms. 01/27/21  Yes Angiulli, Lavon Paganini, PA-C  polyethylene glycol (MIRALAX / GLYCOLAX) 17 g packet Take 17 g by mouth daily as needed for mild constipation (PRN for DAYTIME - has separate Senokot-S for bedtime prn). 01/16/21  Yes Barb Merino, MD  pregabalin (LYRICA) 50 MG capsule Take 1 capsule (50 mg total) by mouth 3 (three) times daily. Patient taking differently: Take 50 mg by mouth 2 (two) times daily. 01/27/21  Yes Angiulli, Lavon Paganini, PA-C  rosuvastatin (CRESTOR) 20 MG tablet Take 1 tablet (20 mg total) by mouth daily. Patient taking differently: Take 20 mg by mouth at bedtime. 01/27/21  Yes Angiulli, Lavon Paganini, PA-C  sodium bicarbonate 650 MG tablet Take 2 tablets (1,300 mg total) by mouth 3 (three) times daily. Patient taking differently: Take 1,300 mg by mouth 2 (two) times daily. 01/27/21  Yes Angiulli, Lavon Paganini, PA-C  doxycycline (VIBRA-TABS) 100 MG tablet Take 1 tablet (100 mg total) by mouth 2 (two) times daily. Patient not taking: Reported on 04/11/2021 02/15/21   Newt Minion, MD  iron polysaccharides (NIFEREX) 150 MG capsule Take 1 capsule (150 mg total) by mouth daily. Patient not taking: Reported on 03/30/2021 01/27/21   Angiulli, Lavon Paganini, PA-C  Omega-3 Fatty Acids (FISH OIL) 500 MG CAPS Take 1 capsule (500 mg total)  by mouth at bedtime. Patient not taking: Reported on 04/01/2021 01/27/21   Angiulli, Lavon Paganini, PA-C  oxyCODONE-acetaminophen (PERCOCET/ROXICET) 5-325 MG tablet Take 1 tablet by mouth every 4 (four) hours as needed for severe pain. Patient not taking: No sig reported 02/15/21   Persons, Bevely Palmer, PA  sevelamer carbonate (RENVELA) 800 MG tablet Take 1 tablet (800 mg total) by mouth 3 (three) times daily with meals. Patient not taking: Reported on 04/16/2021 01/27/21   Cathlyn Parsons, PA-C  sulfamethoxazole-trimethoprim (BACTRIM DS) 800-160 MG tablet Take 1 tablet by mouth 2 (two) times daily. Patient not taking: No sig reported 03/01/21   Persons, Bevely Palmer, PA  tamsulosin (FLOMAX) 0.4 MG CAPS capsule Take 1 capsule (0.4 mg total) by mouth daily after supper. Patient not taking: Reported on 03/28/2021 01/27/21   Angiulli, Lavon Paganini, PA-C  traZODone (DESYREL) 50 MG tablet Take 0.5-1 tablets (25-50 mg total) by mouth at bedtime. Patient not taking: Reported on 04/07/2021 01/27/21   Cathlyn Parsons, PA-C  witch hazel-glycerin (TUCKS) pad Apply topically as needed for hemorrhoids. Patient not taking: Reported on 04/07/2021 01/27/21   Cathlyn Parsons, PA-C     Critical care time: 67    JD Rexene [facility] 03/24/2021, 3:21 PM  Please see [email or web address] for pager details.  From 7A-7P if no response, please call [phone]. After hours, please call ELink [phone].

## 2021-04-12 NOTE — ED Notes (Signed)
Wife, Karna Christmas would like an update from the doctor.

## 2021-04-12 NOTE — ED Notes (Signed)
Report given to ICU RN

## 2021-04-12 NOTE — Progress Notes (Addendum)
A consult was received from an ED physician for vancomycin per pharmacy dosing.  The patient's profile has been reviewed for ht/wt/allergies/indication/available labs.   A one time order has been placed for vancomycin 2 gm IV x 1 dose.    Further antibiotics/pharmacy consults should be ordered by admitting physician if indicated.                       Thank you, Eudelia Bunch, Pharm.D 03/23/2021 3:24 PM   Addendum: Alerted by ICU RN that pt weight is 77 kg today. Per Epic his weight was 90.7 kg on 03/24/2021.  Plan: change vancomycin 2 gm to vancomycin 1500 mg IV x 1 dose.  Eudelia Bunch, Pharm.D 04/06/2021 5:43 PM

## 2021-04-12 NOTE — Progress Notes (Addendum)
Pharmacy Antibiotic Note  Fernando Anthony is a 65 y.o. male with hx AKI and osteomyelitis (s/p right BKA in Feb 2022) presented to the ED on 04/15/2021 with c/o SOB after he tested positive for COVID at home.  Pharmacy has been consulted to dose vancomycin for suspected PNA.  Plan: - vancomycin 2000 mg IV x1 ordered in the ED. Will f/u with renal function and re-dose in a couple of days - ceftriaxone 2gm IV q24h - azithromycin '500mg'$  IV q24h  ________________________________________  Weight: 90.7 kg (199 lb 15.3 oz)  Temp (24hrs), Avg:98.7 F (37.1 C), Min:97.9 F (36.6 C), Max:99.4 F (37.4 C)  Recent Labs  Lab 04/10/2021 1230  WBC 4.2  CREATININE 3.35*  LATICACIDVEN 5.8*    Estimated Creatinine Clearance: 26.2 mL/min (A) (by C-G formula based on SCr of 3.35 mg/dL (H)).    Allergies  Allergen Reactions  . Bactrim [Sulfamethoxazole-Trimethoprim] Nausea And Vomiting  . Doxycycline Nausea And Vomiting  . Oxycodone     History of addiction   . Vicodin [Hydrocodone-Acetaminophen] Itching    Thank you for allowing pharmacy to be a part of this patient's care.  Lynelle Doctor 04/10/2021 4:49 PM   Addendum: Alerted by ICU RN that pt weight is 77 kg today. Per Epic his weight was 90.7 kg on 03/24/2021. Vancomycin Loading dose changed to 1500 mg  Eudelia Bunch, Pharm.D 04/05/2021 5:51 PM

## 2021-04-12 NOTE — ED Notes (Signed)
ED provider notified of pt VS. RT paged at this time for high-flow O2 and ABG.

## 2021-04-12 NOTE — ED Triage Notes (Signed)
Pt presents to the ED via EMS from home for SOB. EMS was called by pt's wife, and EMS reports pt was initially 60% on room air and lethargic on their arrival. Pt tested positive for COVID within the last week.

## 2021-04-12 NOTE — ED Provider Notes (Signed)
[facility] DEPT Provider Note   CSN: [id number] Arrival date & time: 04/16/2021  1218     History Chief Complaint  Patient presents with  . Shortness of Breath    MCCABE Fernando Anthony is a 65 y.o. male.  HPI     65 y.o comes in with cc of dib. PT has been having cough x 6 days. Wife developed cough on Friday, they did home test and it was positive. 2-3 days ago pt started feeling weak and sick. On Sunday he didn't come out of bed at all. His cough got worse. This morning he started having vomiting. Pt hasnt eaten since Sat and hasn't been drinking well. Earlier today he started having labored breathing.  Pt has received 2 rounds of covid shots and no booster. He has no lung dz, he smokes pack a day.  Pt has no hx of PE, DVT and denies any exogenous hormone (testosterone / estrogen) use, long distance travels or surgery in the past 6 weeks, active cancer, recent immobilization.   Past Medical History:  Diagnosis Date  . Back pain   . Diabetes mellitus   . Headache(784.0)    general  . Hyperlipidemia   . Hypertension   . Osteomyelitis ([place]) 11/2018   RIGHT FOOT    Patient Active Problem List   Diagnosis Date Noted  . Acute respiratory failure with hypoxia ([place]) 03/29/2021  . Bradycardia   . Right below-knee amputee ([place]) 01/16/2021  . Acute osteomyelitis of right calcaneus ([place])   . Severe protein-calorie malnutrition ([place])   . AKI (acute kidney injury) ([place]) 01/04/2021  . Anemia 01/04/2021  . History of transmetatarsal amputation of right foot ([place]) 06/11/2019  . Neuropathic pain of foot, right 06/11/2019  . Memory difficulties 01/18/2019  . Psychophysiological insomnia 01/18/2019  . Hyperlipidemia associated with type 2 diabetes mellitus ([place]) 12/13/2018  . Hypertension associated with type 2 diabetes mellitus ([place]) 12/13/2018  . 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 Osteomyelitis of right foot ([place])  12/04/2018  . Hypertensive urgency 12/04/2018  . Abnormal LFTs 12/04/2018  . Hyponatremia 12/04/2018  . Osteomyelitis ([place]) 12/04/2018  . Hypertension, essential, benign 08/20/2017  . Hypokalemia 08/20/2017  . 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 Lumbar post-laminectomy syndrome 09/16/2014  . Diabetic neuropathy ([place]) 04/04/2013  . Postlaminectomy syndrome, lumbar region 05/18/2012  . Uncontrolled diabetes mellitus with diabetic nephropathy ([place]) 05/18/2012    Past Surgical History:  Procedure Laterality Date  . AMPUTATION Right 09/18/2013   Procedure: Amputation Right Great Toe at [facility];  Surgeon: Newt Minion, MD;  Location: [facility];  Service: Orthopedics;  Laterality: Right;  Amputation Right Great Toe at MTP  . AMPUTATION Right 12/05/2018   Procedure: PARTIAL AMPUTATION FIRST RAY RIGHT FOOT;  Surgeon: Edrick Kins, DPM;  Location: [facility];  Service: Podiatry;  Laterality: Right;  . AMPUTATION Right 01/11/2021   Procedure: AMPUTATION BELOW KNEE;  Surgeon: Newt Minion, MD;  Location: [facility];  Service: Orthopedics;  Laterality: Right;  . APPLICATION OF WOUND VAC Right 01/11/2021   Procedure: APPLICATION OF WOUND VAC;  Surgeon: Newt Minion, MD;  Location: [facility];  Service: Orthopedics;  Laterality: Right;  . BONE BIOPSY Right 01/06/2021   Procedure: BONE BIOPSY;  Surgeon: Edrick Kins, DPM;  Location: [facility];  Service: Podiatry;  Laterality: Right;  . CARDIAC CATHETERIZATION  ?1990  . INCISION AND DRAINAGE Right  01/06/2021   Procedure: INCISION AND DRAINAGE, ANTIBIOTIC BEAD PLACEMENT;  Surgeon: Edrick Kins, DPM;  Location: [facility];  Service: Podiatry;  Laterality: Right;  . SPINE SURGERY         Family History  Problem Relation Age of Onset  . Cancer Mother   . Heart disease Father     Social History   Tobacco Use  . Smoking status: Current Every Day Smoker    Packs/day: 1.00     Years: 40.00    Pack years: 40.00    Types: Cigarettes  . Smokeless tobacco: Never Used  Vaping Use  . Vaping Use: Former  Substance Use Topics  . Alcohol use: Not Currently  . Drug use: Not Currently    Comment: past use of marijuana    Home Medications Prior to Admission medications   Medication Sig Start Date End Date Taking? Authorizing Provider  acetaminophen (TYLENOL) 325 MG tablet Take 1-2 tablets (325-650 mg total) by mouth every 6 (six) hours as needed for mild pain (pain score 1-3 or temp > 100.5). 01/16/21  Yes Barb Merino, MD  acetaminophen (TYLENOL) 650 MG CR tablet Take 650 mg by mouth every 8 (eight) hours as needed for pain.   Yes [provider]  amLODipine (NORVASC) 10 MG tablet Take 1 tablet (10 mg total) by mouth daily. 01/27/21  Yes Angiulli, Lavon Paganini, PA-C  aspirin 81 MG tablet Take 81 mg by mouth at bedtime.    Yes [provider]  bismuth subsalicylate (PEPTO BISMOL) 262 MG/15ML suspension Take 30 mLs by mouth every 6 (six) hours as needed (nausea).   Yes [provider]  chlorthalidone (HYGROTON) 25 MG tablet Take 12.5 mg by mouth at bedtime.   Yes [provider]  docusate sodium (COLACE) 100 MG capsule Take 1 capsule (100 mg total) by mouth 2 (two) times daily. Patient taking differently: Take 100 mg by mouth 2 (two) times daily as needed for mild constipation. 01/27/21  Yes Angiulli, Lavon Paganini, PA-C  FIASP FLEXTOUCH 100 UNIT/ML FlexTouch Pen Inject 12 Units into the skin 3 (three) times daily as needed (high blood sugar). 03/12/21  Yes [provider]  insulin glargine (LANTUS) 100 UNIT/ML Solostar Pen Inject 20 Units into the skin daily. Patient taking differently: Inject 20 Units into the skin at bedtime. 01/27/21  Yes Angiulli, Lavon Paganini, PA-C  methocarbamol (ROBAXIN) 500 MG tablet Take 1 tablet (500 mg total) by mouth every 6 (six) hours as needed for muscle spasms. 01/27/21  Yes Angiulli, Lavon Paganini, PA-C  polyethylene  glycol (MIRALAX / GLYCOLAX) 17 g packet Take 17 g by mouth daily as needed for mild constipation (PRN for DAYTIME - has separate Senokot-S for bedtime prn). 01/16/21  Yes Barb Merino, MD  pregabalin (LYRICA) 50 MG capsule Take 1 capsule (50 mg total) by mouth 3 (three) times daily. Patient taking differently: Take 50 mg by mouth 2 (two) times daily. 01/27/21  Yes Angiulli, Lavon Paganini, PA-C  rosuvastatin (CRESTOR) 20 MG tablet Take 1 tablet (20 mg total) by mouth daily. Patient taking differently: Take 20 mg by mouth at bedtime. 01/27/21  Yes Angiulli, Lavon Paganini, PA-C  sodium bicarbonate 650 MG tablet Take 2 tablets (1,300 mg total) by mouth 3 (three) times daily. Patient taking differently: Take 1,300 mg by mouth 2 (two) times daily. 01/27/21  Yes Angiulli, Lavon Paganini, PA-C  doxycycline (VIBRA-TABS) 100 MG tablet Take 1 tablet (100 mg total) by mouth 2 (two) times daily. Patient not taking:  Reported on 03/28/2021 02/15/21   Newt Minion, MD  iron polysaccharides (NIFEREX) 150 MG capsule Take 1 capsule (150 mg total) by mouth daily. Patient not taking: Reported on 03/25/2021 01/27/21   Angiulli, Lavon Paganini, PA-C  Omega-3 Fatty Acids (FISH OIL) 500 MG CAPS Take 1 capsule (500 mg total) by mouth at bedtime. Patient not taking: Reported on 04/02/2021 01/27/21   Angiulli, Lavon Paganini, PA-C  oxyCODONE-acetaminophen (PERCOCET/ROXICET) 5-325 MG tablet Take 1 tablet by mouth every 4 (four) hours as needed for severe pain. Patient not taking: No sig reported 02/15/21   Persons, Bevely Palmer, PA  sevelamer carbonate (RENVELA) 800 MG tablet Take 1 tablet (800 mg total) by mouth 3 (three) times daily with meals. Patient not taking: Reported on 04/05/2021 01/27/21   Cathlyn Parsons, PA-C  sulfamethoxazole-trimethoprim (BACTRIM DS) 800-160 MG tablet Take 1 tablet by mouth 2 (two) times daily. Patient not taking: No sig reported 03/01/21   Persons, Bevely Palmer, PA  tamsulosin (FLOMAX) 0.4 MG CAPS capsule Take 1 capsule (0.4 mg total) by  mouth daily after supper. Patient not taking: Reported on 04/15/2021 01/27/21   Angiulli, Lavon Paganini, PA-C  traZODone (DESYREL) 50 MG tablet Take 0.5-1 tablets (25-50 mg total) by mouth at bedtime. Patient not taking: Reported on 04/18/2021 01/27/21   Cathlyn Parsons, PA-C  witch hazel-glycerin (TUCKS) pad Apply topically as needed for hemorrhoids. Patient not taking: Reported on 03/21/2021 01/27/21   Angiulli, Lavon Paganini, PA-C    Allergies    Bactrim [sulfamethoxazole-trimethoprim], Doxycycline, Oxycodone, and Vicodin [hydrocodone-acetaminophen]  Review of Systems   Review of Systems  Unable to perform ROS: Acuity of condition    Physical Exam Updated Vital Signs BP (!) 150/84 (BP Location: Left Arm)   Pulse (!) 108   Temp 97.9 F (36.6 C) (Oral)   Resp (!) 44   Wt 90.7 kg   SpO2 96%   BMI 25.67 kg/m   Physical Exam Vitals and nursing note reviewed.  Constitutional:      General: He is in acute distress.     Appearance: He is ill-appearing and toxic-appearing.  HENT:     Head: Atraumatic.  Cardiovascular:     Rate and Rhythm: Tachycardia present.  Pulmonary:     Effort: Tachypnea, accessory muscle usage and respiratory distress present.     Breath sounds: Rhonchi present.  Musculoskeletal:     Cervical back: Neck supple.  Skin:    General: Skin is warm.  Neurological:     Mental Status: He is oriented to person, place, and time.     ED Results / Procedures / Treatments   Labs (all labs ordered are listed, but only abnormal results are displayed) Labs Reviewed  LACTIC ACID, PLASMA - Abnormal; Notable for the following components:      Result Value   Lactic Acid, Venous 5.8 (*)    All other components within normal limits  CBC WITH DIFFERENTIAL/PLATELET - Abnormal; Notable for the following components:   RBC 4.04 (*)    Hemoglobin 12.4 (*)    HCT 37.1 (*)    Platelets 107 (*)    Lymphs Abs 0.2 (*)    All other components within normal limits  COMPREHENSIVE  METABOLIC PANEL - Abnormal; Notable for the following components:   Sodium 132 (*)    CO2 20 (*)    Glucose, Bld 412 (*)    BUN 57 (*)    Creatinine, Ser 3.35 (*)    Calcium 7.7 (*)  Total Protein 5.5 (*)    Albumin 2.1 (*)    GFR, Estimated 20 (*)    All other components within normal limits  D-DIMER, QUANTITATIVE - Abnormal; Notable for the following components:   D-Dimer, Quant 17.04 (*)    All other components within normal limits  LACTATE DEHYDROGENASE - Abnormal; Notable for the following components:   LDH 200 (*)    All other components within normal limits  FERRITIN - Abnormal; Notable for the following components:   Ferritin 1,051 (*)    All other components within normal limits  TRIGLYCERIDES - Abnormal; Notable for the following components:   Triglycerides 158 (*)    All other components within normal limits  FIBRINOGEN - Abnormal; Notable for the following components:   Fibrinogen >800 (*)    All other components within normal limits  C-REACTIVE PROTEIN - Abnormal; Notable for the following components:   CRP 27.6 (*)    All other components within normal limits  BLOOD GAS, ARTERIAL - Abnormal; Notable for the following components:   pCO2 arterial 29.7 (*)    pO2, Arterial 74.5 (*)    Bicarbonate 17.8 (*)    Acid-base deficit 5.3 (*)    All other components within normal limits  CBG MONITORING, ED - Abnormal; Notable for the following components:   Glucose-Capillary 374 (*)    All other components within normal limits  RESP PANEL BY RT-PCR (FLU A&B, COVID) ARPGX2  CULTURE, BLOOD (ROUTINE X 2)  CULTURE, BLOOD (ROUTINE X 2)  MRSA PCR SCREENING  PROCALCITONIN  LACTIC ACID, PLASMA  BETA-HYDROXYBUTYRIC ACID  HEMOGLOBIN A1C  OSMOLALITY  MAGNESIUM  PHOSPHORUS  URINALYSIS, COMPLETE (UACMP) WITH MICROSCOPIC    EKG EKG Interpretation  Date/Time:  Monday Apr 12 2021 12:27:57 EDT Ventricular Rate:  128 PR Interval:  120 QRS Duration: 90 QT  Interval:  319 QTC Calculation: 466 R Axis:   101 Text Interpretation: Sinus tachycardia Ventricular premature complex Aberrant conduction of SV complex(es) Consider right atrial enlargement Right axis deviation Consider left ventricular hypertrophy Nonspecific T abnormalities, inferior leads No acute changes Confirmed by Varney Biles [id number]) on 03/27/2021 1:09:03 PM   Radiology DG Chest Port 1 View  Result Date: 04/20/2021 CLINICAL DATA:  Shortness of breath.  COVID-19 positive patient. EXAM: PORTABLE CHEST 1 VIEW COMPARISON:  Single-view of the chest 01/24/2021. PA and lateral chest 09/18/2013 and 01/04/2021. FINDINGS: The patient has bilateral airspace disease which is worse in the left mid and lower lung zones. Right perihilar opacity is identified. No pneumothorax or pleural effusion. Heart size is normal. No acute or focal bony abnormality. IMPRESSION: Multifocal pneumonia, worse on the left. Electronically Signed   By: Inge Rise M.D.   On: 03/23/2021 14:06    Procedures .Critical Care Performed by: Varney Biles, MD Authorized by: Varney Biles, MD   Critical care provider statement:    Critical care time (minutes):  55   Critical care was necessary to treat or prevent imminent or life-threatening deterioration of the following conditions:  Respiratory failure   Critical care was time spent personally by me on the following activities:  Discussions with consultants, evaluation of patient's response to treatment, examination of patient, ordering and performing treatments and interventions, ordering and review of laboratory studies, ordering and review of radiographic studies, pulse oximetry, re-evaluation of patient's condition, obtaining history from patient or surrogate and review of old charts     Medications Ordered in ED Medications  remdesivir 200 mg in sodium chloride 0.9% 250 mL  IVPB (200 mg Intravenous New Bag/Given 04/10/2021 1421)    Followed by  remdesivir 100  mg in sodium chloride 0.9 % 100 mL IVPB (has no administration in time range)  azithromycin (ZITHROMAX) 500 mg in sodium chloride 0.9 % 250 mL IVPB (has no administration in time range)  methylPREDNISolone sodium succinate (SOLU-MEDROL) 40 mg/mL injection 40 mg (has no administration in time range)  vancomycin (VANCOREADY) IVPB 2000 mg/400 mL (has no administration in time range)  heparin ADULT infusion 100 units/mL (25000 units/290m) (has no administration in time range)  heparin bolus via infusion 3,000 Units (has no administration in time range)  insulin regular, human (MYXREDLIN) 100 units/ 100 mL infusion (has no administration in time range)  lactated ringers infusion (has no administration in time range)  dextrose 5 % in lactated ringers infusion (has no administration in time range)  dextrose 50 % solution 0-50 mL (has no administration in time range)  dexamethasone (DECADRON) injection 6 mg (6 mg Intravenous Given 04/14/2021 1412)  cefTRIAXone (ROCEPHIN) 1 g in sodium chloride 0.9 % 100 mL IVPB (1 g Intravenous New Bag/Given 03/26/2021 1522)    ED Course  I have reviewed the triage vital signs and the nursing notes.  Pertinent labs & imaging results that were available during my care of the patient were reviewed by me and considered in my medical decision making (see chart for details).    MDM Rules/Calculators/A&P                          65year old male comes in a chief complaint of shortness of breath.  Patient noted to be hypoxic at arrival.  He is also in respiratory distress.  He had COVID-19 at home test on Friday.  He has been feeling unwell since Tuesday.  Wife reports that patient started getting really bad on Saturday and then Sunday.  Today the shortness of breath is more pronounced than it has been before.  His p.o. intake has gone down.  He has CKD, and his creatinine is slightly higher than baseline right now.  X-ray shows multifocal pneumonia. Discussed the case with  ICU team, they will admit.  Patient is full code. He is vaccinated but not boosted.  We have given him IV dexamethasone and remdesivir. Patient's procalcitonin is high, we will also give him broad-spectrum antibiotics right now given that we are seeing a lot of COVID-19 affecting upper respiratory at this time and not the lower respiratory tract.  Stable for admission.  Final Clinical Impression(s) / ED Diagnoses Final diagnoses:  Acute hypoxemic respiratory failure ([place]  Acute COVID-19    Rx / DC Orders ED Discharge Orders    None       NVarney Biles MD 03/28/2021 1612

## 2021-04-12 NOTE — ED Notes (Signed)
Verified with pharmacy pt is to receive second dose of Remdesevir tomorrow, 2021-04-27.

## 2021-04-12 NOTE — ED Notes (Signed)
RN notified of abnormal lab 

## 2021-04-12 NOTE — ED Notes (Signed)
Pt on 10L high flow nasal cannula oxygen at this time in addition to 15L non-rebreather mask. O2 sats at 96%.

## 2021-04-12 NOTE — ED Notes (Signed)
Provider at bedside to re-evaluate.

## 2021-04-12 NOTE — ED Notes (Signed)
Lactic 5.8 received from lab.

## 2021-04-12 NOTE — Procedures (Signed)
Intubation Procedure Note  Fernando Anthony  [id number]  11/12/1956  Date:03/29/2021  Time:7:08 PM   Provider Performing:Emmani Lesueur D Rollene Rotunda    Procedure: Intubation ([id number])  Indication(s) Respiratory Failure  Consent Risks of the procedure as well as the alternatives and risks of each were explained to the patient and/or caregiver.  Consent for the procedure was obtained and is signed in the bedside chart   Anesthesia Etomidate, Versed, Fentanyl and Rocuronium   Time Out Verified patient identification, verified procedure, site/side was marked, verified correct patient position, special equipment/implants available, medications/allergies/relevant history reviewed, required imaging and test results available.   Sterile Technique Usual hand hygeine, masks, and gloves were used   Procedure Description Patient positioned in bed supine.  Sedation given as noted above.  Patient was intubated with endotracheal tube using Glidescope.  View was Grade 1 full glottis .  Number of attempts was 1.  Colorimetric CO2 detector was consistent with tracheal placement.   Complications/Tolerance None; patient tolerated the procedure well. Chest X-ray is ordered to verify placement.   EBL Minimal   Specimen(s) None  JD [name] 04/10/2021, 7:08 PM  Please see [email or web address] for pager details.  From 7A-7P if no response, please call [phone]. After hours, please call ELink [phone].

## 2021-04-12 NOTE — Progress Notes (Addendum)
ANTICOAGULATION CONSULT NOTE - Initial Consult  Pharmacy Consult for heparin Indication: r/o PE  Allergies  Allergen Reactions  . Bactrim [Sulfamethoxazole-Trimethoprim] Nausea And Vomiting  . Doxycycline Nausea And Vomiting  . Oxycodone     History of addiction   . Vicodin [Hydrocodone-Acetaminophen] Itching    Patient Measurements: Weight: 90.7 kg (199 lb 15.3 oz) on 03/24/2021 per Epic. Wt on admit 03/24/2021  = 77 kg per ICU RN Heparin Dosing Weight:   Vital Signs: Temp: 97.9 F (36.6 C) (05/23 1416) Temp Source: Oral (05/23 1416) BP: 150/84 (05/23 1523) Pulse Rate: 108 (05/23 1523)  Labs: Recent Labs    03/22/2021 1230  HGB 12.4*  HCT 37.1*  PLT 107*  CREATININE 3.35*    Estimated Creatinine Clearance: 26.2 mL/min (A) (by C-G formula based on SCr of 3.35 mg/dL (H)).   Medical History: Past Medical History:  Diagnosis Date  . Back pain   . Diabetes mellitus   . Headache(784.0)    general  . Hyperlipidemia   . Hypertension   . Osteomyelitis ([place]) 11/2018   RIGHT FOOT     Assessment: 65 yo M Covid positive in ED with SOB.  Pharmacy consulted to dose heparin for r/o PE.  Hg 12.4, PLT low 107; D dimer 17.04. SCr 3.35  Goal of Therapy:  Heparin level 0.3-0.7 units/ml Monitor platelets by anticoagulation protocol: Yes   Plan:  Give 3000 units bolus x 1 Start heparin infusion at 1550 units/hr Check anti-Xa level in 8 hours and daily while on heparin Continue to monitor H&H and platelets   Eudelia Bunch, Pharm.D 03/30/2021 3:33 PM  Addendum: Alerted by ICU RN that pt weight is 77 kg today. Per Epic his weight was 90.7 kg on 03/24/2021.  Plan: Heparin 2500 unit bolus IV x 1 Heparin drip 1450 units/hr Check 8 hr heparin level Daily CBC, heparin level  Eudelia Bunch, Pharm.D 04/07/2021 5:44 PM

## 2021-04-12 NOTE — ED Notes (Signed)
RT at the bedside and patient placed on high flow oxygen. Satting at 94% at this time.

## 2021-04-12 NOTE — Progress Notes (Signed)
[facility] Progress Note Patient Name: Fernando Anthony DOB: December 09, 1955 MRN: [id number]   Date of Service  03/28/2021  HPI/Events of Note  Asked to review portable CXR and abdominal film for ETT and NGT placement. Endotracheal tube in appropriate position. Nasogastric tube proximal side hole within the distal esophagus. Advancement by 5-10 cm may better position the catheter.  eICU Interventions  Plan: 1. Advance NGT by 10 cm and repeat portable abdominal film.     Intervention Category Major Interventions: Other:  Lysle Dingwall 03/28/2021, 8:09 PM

## 2021-04-12 NOTE — ED Notes (Signed)
Critical Care PA-C at the bedside.

## 2021-04-12 NOTE — Procedures (Signed)
Central Venous Catheter Insertion Procedure Note  Fernando Anthony  [id number]  25-Jun-1956  Date:04/11/2021  Time:7:07 PM   Provider Performing:Ayerim Berquist D Rollene Rotunda   Procedure: Insertion of Non-tunneled Central Venous [phone]) with US guidance [id number])   Indication(s) Medication administration  Consent Risks of the procedure as well as the alternatives and risks of each were explained to the patient and/or caregiver.  Consent for the procedure was obtained and is signed in the bedside chart  Anesthesia Topical only with 1% lidocaine   Timeout Verified patient identification, verified procedure, site/side was marked, verified correct patient position, special equipment/implants available, medications/allergies/relevant history reviewed, required imaging and test results available.  Sterile Technique Maximal sterile technique including full sterile barrier drape, hand hygiene, sterile gown, sterile gloves, mask, hair covering, sterile ultrasound probe cover (if used).  Procedure Description Area of catheter insertion was cleaned with chlorhexidine and draped in sterile fashion.  With real-time ultrasound guidance a central venous catheter was placed into the left internal jugular vein. Nonpulsatile blood flow and easy flushing noted in all ports.  The catheter was sutured in place and sterile dressing applied.  Complications/Tolerance None; patient tolerated the procedure well. Chest X-ray is ordered to verify placement for internal jugular or subclavian cannulation.   Chest x-ray is not ordered for femoral cannulation.  EBL Minimal  Specimen(s) None  JD Rexene Agent [facility] 04/18/2021, 7:08 PM  Please see [email or web address] for pager details.  From 7A-7P if no response, please call [phone]. After hours, please call ELink [phone].

## 2021-04-12 NOTE — ED Notes (Signed)
ED provider notified of pt's O2 sats of 90% despite 10L high flow nasal cannula and 15L non-rebreather. Provider to evaluate. Xray at the bedside at this time. No additional orders at this time. Will continue to monitor.

## 2021-04-13 ENCOUNTER — Inpatient Hospital Stay (HOSPITAL_COMMUNITY): Payer: BC Managed Care – PPO

## 2021-04-13 ENCOUNTER — Other Ambulatory Visit: Payer: Self-pay

## 2021-04-13 DIAGNOSIS — A419 Sepsis, unspecified organism: Secondary | ICD-10-CM

## 2021-04-13 DIAGNOSIS — Z66 Do not resuscitate: Secondary | ICD-10-CM

## 2021-04-13 DIAGNOSIS — N171 Acute kidney failure with acute cortical necrosis: Secondary | ICD-10-CM | POA: Diagnosis not present

## 2021-04-13 DIAGNOSIS — U071 COVID-19: Secondary | ICD-10-CM | POA: Diagnosis not present

## 2021-04-13 DIAGNOSIS — I2609 Other pulmonary embolism with acute cor pulmonale: Secondary | ICD-10-CM

## 2021-04-13 DIAGNOSIS — J9601 Acute respiratory failure with hypoxia: Secondary | ICD-10-CM | POA: Diagnosis not present

## 2021-04-13 DIAGNOSIS — R6521 Severe sepsis with septic shock: Secondary | ICD-10-CM

## 2021-04-13 DIAGNOSIS — R570 Cardiogenic shock: Secondary | ICD-10-CM

## 2021-04-13 LAB — BASIC METABOLIC PANEL
Anion gap: 15 (ref 5–15)
Anion gap: 14 (ref 5–15)
BUN: 62 mg/dL — ABNORMAL HIGH (ref 8–23)
BUN: 55 mg/dL — ABNORMAL HIGH (ref 8–23)
CO2: 31 mmol/L (ref 22–32)
CO2: 27 mmol/L (ref 22–32)
Calcium: 6.6 mg/dL — ABNORMAL LOW (ref 8.9–10.3)
Calcium: 6.8 mg/dL — ABNORMAL LOW (ref 8.9–10.3)
Chloride: 99 mmol/L (ref 98–111)
Chloride: 100 mmol/L (ref 98–111)
Creatinine, Ser: 3.96 mg/dL — ABNORMAL HIGH (ref 0.61–1.24)
Creatinine, Ser: 3.97 mg/dL — ABNORMAL HIGH (ref 0.61–1.24)
GFR, Estimated: 16 mL/min — ABNORMAL LOW (ref >60)
GFR, Estimated: 16 mL/min — ABNORMAL LOW (ref >60)
Glucose, Bld: 219 mg/dL — ABNORMAL HIGH (ref 70–99)
Glucose, Bld: 89 mg/dL (ref 70–99)
Potassium: 3.4 mmol/L — ABNORMAL LOW (ref 3.5–5.1)
Potassium: 3.5 mmol/L (ref 3.5–5.1)
Sodium: 145 mmol/L (ref 135–145)
Sodium: 141 mmol/L (ref 135–145)

## 2021-04-13 LAB — GLUCOSE, CAPILLARY
Glucose-Capillary: 120 mg/dL — ABNORMAL HIGH (ref 70–99)
Glucose-Capillary: 124 mg/dL — ABNORMAL HIGH (ref 70–99)
Glucose-Capillary: 157 mg/dL — ABNORMAL HIGH (ref 70–99)
Glucose-Capillary: 197 mg/dL — ABNORMAL HIGH (ref 70–99)
Glucose-Capillary: 43 mg/dL — CL (ref 70–99)
Glucose-Capillary: 81 mg/dL (ref 70–99)
Glucose-Capillary: 87 mg/dL (ref 70–99)
Glucose-Capillary: 98 mg/dL (ref 70–99)

## 2021-04-13 LAB — BLOOD GAS, ARTERIAL
Acid-base deficit: 0.2 mmol/L (ref 0.0–2.0)
Acid-base deficit: 0.5 mmol/L (ref 0.0–2.0)
Bicarbonate: 25.4 mmol/L (ref 20.0–28.0)
Bicarbonate: 25.1 mmol/L (ref 20.0–28.0)
FIO2: 100
FIO2: 100
O2 Saturation: 97 %
O2 Saturation: 96.9 %
Patient temperature: 98.6
Patient temperature: 98.6
pCO2 arterial: 48.1 mmHg — ABNORMAL HIGH (ref 32.0–48.0)
pCO2 arterial: 47.7 mmHg (ref 32.0–48.0)
pH, Arterial: 7.341 — ABNORMAL LOW (ref 7.350–7.450)
pH, Arterial: 7.342 — ABNORMAL LOW (ref 7.350–7.450)
pO2, Arterial: 125 mmHg — ABNORMAL HIGH (ref 83.0–108.0)
pO2, Arterial: 93.6 mmHg (ref 83.0–108.0)

## 2021-04-13 LAB — URINALYSIS, COMPLETE (UACMP) WITH MICROSCOPIC
Bilirubin Urine: NEGATIVE
Glucose, UA: >=500 mg/dL — AB
Ketones, ur: NEGATIVE mg/dL
Leukocytes,Ua: NEGATIVE
Nitrite: NEGATIVE
Protein, ur: >=300 mg/dL — AB
Specific Gravity, Urine: 1.023 (ref 1.005–1.030)
pH: 5 (ref 5.0–8.0)

## 2021-04-13 LAB — POCT I-STAT 7, (LYTES, BLD GAS, ICA,H+H)
Acid-base deficit: 13 mmol/L — ABNORMAL HIGH (ref 0.0–2.0)
Bicarbonate: 20.8 mmol/L (ref 20.0–28.0)
Calcium, Ion: 1.14 mmol/L — ABNORMAL LOW (ref 1.15–1.40)
HCT: 31 % — ABNORMAL LOW (ref 39.0–52.0)
Hemoglobin: 10.5 g/dL — ABNORMAL LOW (ref 13.0–17.0)
O2 Saturation: 96 %
Patient temperature: 98.6
Potassium: 3.6 mmol/L (ref 3.5–5.1)
Sodium: 136 mmol/L (ref 135–145)
TCO2: 24 mmol/L (ref 22–32)
pCO2 arterial: 109.3 mmHg (ref 32.0–48.0)
pH, Arterial: 6.887 — CL (ref 7.350–7.450)
pO2, Arterial: 142 mmHg — ABNORMAL HIGH (ref 83.0–108.0)

## 2021-04-13 LAB — CBC
HCT: 29.8 % — ABNORMAL LOW (ref 39.0–52.0)
Hemoglobin: 9.7 g/dL — ABNORMAL LOW (ref 13.0–17.0)
MCH: 30.7 pg (ref 26.0–34.0)
MCHC: 32.6 g/dL (ref 30.0–36.0)
MCV: 94.3 fL (ref 80.0–100.0)
Platelets: 105 10*3/uL — ABNORMAL LOW (ref 150–400)
RBC: 3.16 MIL/uL — ABNORMAL LOW (ref 4.22–5.81)
RDW: 13.7 % (ref 11.5–15.5)
WBC: 3.5 10*3/uL — ABNORMAL LOW (ref 4.0–10.5)
nRBC: 0 % (ref 0.0–0.2)

## 2021-04-13 LAB — HEMOGLOBIN A1C
Hgb A1c MFr Bld: 10.4 % — ABNORMAL HIGH (ref 4.8–5.6)
Mean Plasma Glucose: 251.78 mg/dL

## 2021-04-13 LAB — ECHOCARDIOGRAM COMPLETE
Height: 74 in
Weight: 2716.07 oz

## 2021-04-13 LAB — OSMOLALITY: Osmolality: 320 mOsm/kg — ABNORMAL HIGH (ref 275–295)

## 2021-04-13 LAB — MAGNESIUM: Magnesium: 1.5 mg/dL — ABNORMAL LOW (ref 1.7–2.4)

## 2021-04-13 LAB — HEPARIN LEVEL (UNFRACTIONATED)
Heparin Unfractionated: 0.3 IU/mL (ref 0.30–0.70)
Heparin Unfractionated: 0.8 IU/mL — ABNORMAL HIGH (ref 0.30–0.70)

## 2021-04-13 LAB — TROPONIN I (HIGH SENSITIVITY)
Troponin I (High Sensitivity): 62 ng/L — ABNORMAL HIGH (ref <18)
Troponin I (High Sensitivity): 112 ng/L (ref <18)
Troponin I (High Sensitivity): 269 ng/L (ref <18)

## 2021-04-13 LAB — STREP PNEUMONIAE URINARY ANTIGEN: Strep Pneumo Urinary Antigen: NEGATIVE

## 2021-04-13 MED ORDER — SODIUM BICARBONATE 8.4 % IV SOLN
INTRAVENOUS | Status: AC
  Filled 2021-04-13: qty 50

## 2021-04-13 MED ORDER — SODIUM CHLORIDE 0.9 % IV SOLN
100.0000 mg | Freq: Two times a day (BID) | INTRAVENOUS | Status: DC
  Filled 2021-04-13: qty 100

## 2021-04-13 MED ORDER — AMIODARONE HCL IN DEXTROSE 360-4.14 MG/200ML-% IV SOLN
60.0000 mg/h | INTRAVENOUS | Status: AC
  Administered 2021-04-13 (×2): 60 mg/h via INTRAVENOUS
  Filled 2021-04-13: qty 200

## 2021-04-13 MED ORDER — ASPIRIN 81 MG PO CHEW
81.0000 mg | CHEWABLE_TABLET | Freq: Every day | ORAL | Status: DC
  Administered 2021-04-13: 81 mg
  Filled 2021-04-13: qty 1

## 2021-04-13 MED ORDER — PHENYLEPHRINE HCL-NACL 10-0.9 MG/250ML-% IV SOLN
0.0000 ug/min | INTRAVENOUS | Status: DC
  Administered 2021-04-13: 20 ug/min via INTRAVENOUS
  Administered 2021-04-13: 240 ug/min via INTRAVENOUS
  Filled 2021-04-13 (×4): qty 250

## 2021-04-13 MED ORDER — SODIUM BICARBONATE 8.4 % IV SOLN
200.0000 meq | Freq: Once | INTRAVENOUS | Status: AC
  Administered 2021-04-13: 200 meq via INTRAVENOUS

## 2021-04-13 MED ORDER — ACETAMINOPHEN 160 MG/5ML PO SOLN: 650.0000 mg | Freq: Four times a day (QID) | ORAL | Status: DC | PRN

## 2021-04-13 MED ORDER — AMIODARONE HCL IN DEXTROSE 360-4.14 MG/200ML-% IV SOLN
30.0000 mg/h | INTRAVENOUS | Status: DC
  Administered 2021-04-13: 30 mg/h via INTRAVENOUS

## 2021-04-13 MED ORDER — AMIODARONE LOAD VIA INFUSION
150.0000 mg | Freq: Once | INTRAVENOUS | Status: AC
  Administered 2021-04-13: 150 mg via INTRAVENOUS
  Filled 2021-04-13: qty 83.34

## 2021-04-13 MED ORDER — MAGNESIUM SULFATE 2 GM/50ML IV SOLN
2.0000 g | Freq: Once | INTRAVENOUS | Status: AC
  Administered 2021-04-13: 2 g via INTRAVENOUS
  Filled 2021-04-13: qty 50

## 2021-04-13 MED ORDER — DEXTROSE 5 % IV SOLN
0.5000 ug/min | INTRAVENOUS | Status: DC
  Administered 2021-04-13: 20 ug/min via INTRAVENOUS
  Filled 2021-04-13 (×2): qty 8

## 2021-04-13 MED ORDER — INSULIN ASPART 100 UNIT/ML IJ SOLN: 0.0000 [IU] | INTRAMUSCULAR | Status: DC

## 2021-04-13 MED ORDER — SODIUM CHLORIDE 0.9 % IV SOLN
2.0000 g | INTRAVENOUS | Status: DC
  Administered 2021-04-13: 2 g via INTRAVENOUS
  Filled 2021-04-13: qty 20

## 2021-04-13 MED ORDER — METHYLPREDNISOLONE SODIUM SUCC 125 MG IJ SOLR
80.0000 mg | Freq: Three times a day (TID) | INTRAMUSCULAR | Status: DC
  Administered 2021-04-13: 80 mg via INTRAVENOUS
  Filled 2021-04-13: qty 2

## 2021-04-13 MED ORDER — EPINEPHRINE HCL 5 MG/250ML IV SOLN IN NS
0.5000 ug/min | INTRAVENOUS | Status: DC
  Administered 2021-04-13: 0.5 ug/min via INTRAVENOUS
  Filled 2021-04-13: qty 250

## 2021-04-13 MED ORDER — POTASSIUM CHLORIDE 20 MEQ PO PACK
20.0000 meq | PACK | Freq: Once | ORAL | Status: AC
  Administered 2021-04-13: 20 meq
  Filled 2021-04-13: qty 1

## 2021-04-13 MED ORDER — VASOPRESSIN 20 UNITS/100 ML INFUSION FOR SHOCK
0.0000 [IU]/min | INTRAVENOUS | Status: DC
  Administered 2021-04-13 (×2): 0.03 [IU]/min via INTRAVENOUS
  Filled 2021-04-13 (×2): qty 100

## 2021-04-13 MED ORDER — NOREPINEPHRINE 16 MG/250ML-% IV SOLN
0.0000 ug/min | INTRAVENOUS | Status: DC
  Administered 2021-04-13: 70 ug/min via INTRAVENOUS
  Administered 2021-04-13: 10 ug/min via INTRAVENOUS
  Administered 2021-04-13: 70 ug/min via INTRAVENOUS
  Filled 2021-04-13 (×4): qty 250

## 2021-04-13 MED ORDER — PHENYLEPHRINE CONCENTRATED 100MG/250ML (0.4 MG/ML) INFUSION SIMPLE
0.0000 ug/min | INTRAVENOUS | Status: DC
Start: 2021-04-13 — End: 2021-04-14
  Administered 2021-04-13: 400 ug/min via INTRAVENOUS
  Administered 2021-04-13: 270 ug/min via INTRAVENOUS
  Filled 2021-04-13 (×3): qty 250

## 2021-04-13 MED ORDER — AMIODARONE HCL IN DEXTROSE 360-4.14 MG/200ML-% IV SOLN
INTRAVENOUS | Status: AC
  Filled 2021-04-13: qty 200

## 2021-04-13 MED ORDER — IPRATROPIUM-ALBUTEROL 0.5-2.5 (3) MG/3ML IN SOLN
3.0000 mL | RESPIRATORY_TRACT | Status: DC
  Administered 2021-04-13 (×2): 3 mL via RESPIRATORY_TRACT
  Filled 2021-04-13 (×2): qty 3

## 2021-04-13 MED ORDER — POTASSIUM CHLORIDE 10 MEQ/100ML IV SOLN: 10.0000 meq | INTRAVENOUS | Status: DC

## 2021-04-13 MED ORDER — INSULIN DETEMIR 100 UNIT/ML ~~LOC~~ SOLN
20.0000 [IU] | Freq: Every day | SUBCUTANEOUS | Status: DC
  Administered 2021-04-13: 20 [IU] via SUBCUTANEOUS
  Filled 2021-04-13: qty 0.2

## 2021-04-13 MED FILL — Medication: Qty: 1 | Status: AC

## 2021-04-14 LAB — URINE CULTURE: Culture: NO GROWTH

## 2021-04-14 LAB — LEGIONELLA PNEUMOPHILA SEROGP 1 UR AG: L. pneumophila Serogp 1 Ur Ag: NEGATIVE

## 2021-04-15 ENCOUNTER — Encounter: Payer: BC Managed Care – PPO | Admitting: Physical Therapy

## 2021-04-17 LAB — CULTURE, BLOOD (ROUTINE X 2)
Culture: NO GROWTH
Culture: NO GROWTH
Special Requests: ADEQUATE
Special Requests: ADEQUATE

## 2021-04-21 NOTE — Progress Notes (Signed)
NAME:  Fernando Anthony, MRN:  [id number], DOB:  15-Mar-1956, LOS: 1 ADMISSION DATE:  04/11/2021, CONSULTATION DATE:  03/26/2021 REFERRING MD:  Dr. Kathrynn Humble, CHIEF COMPLAINT: shortness of breath   History of Present Illness:  Patient is a 65 yo male with pertinent PMH of positive home covid test, smoking history, IDDM, HTN, HLD, Osteomyelitis s/p amputation 12/2020 right leg below knee presents to [facility] on 5/23 with increasing shortness of breath with hypoxia.   Patient states sob started on 6 days of cough and weakness. On 5/20, his wife and him took home COVID-19 test which were both positive. His cough and work of breathing worsened the past few days. He has not had an appetite and hasn't eaten/drank much since 5/21. He developed worsening SOB and vomiting on 5/23 which brought him to [facility] ED.  Patient present to the ED with RR in 90s and hypoxic. Placed on NRB/HFNC 15 l/m with sats mid 90s. Normal sinus tach on EKG. BP 158/94. ED course labs/tests: Na 132, K 4.1, co2 20, glucose 412; Bun 57, creatinine 3.35; Anion gap 11, albumin 2.1; LDH 200, CRP 27.6, Lactic acid 5.8, procalcitonin 49.3; ABG: 7.39, paco2 29.7, po2 74.5, hco3 17.8; CXR: multifocal pneumonia worse on left.   PCCM consulted for acute hypoxic respiratory failure. Patient switched currently on NRB/HFNC 15 l/m. RR still in high 30s/low 40s. Sats 95% on 100% NRB/15 l/m HFNC. Patient alert and able to communicate but is tired/labored breathing.   Pertinent  Medical History   Past Medical History:  Diagnosis Date  . Back pain   . Diabetes mellitus   . Headache(784.0)    general  . Hyperlipidemia   . Hypertension   . Osteomyelitis ([place]) 11/2018   RIGHT FOOT     Significant Hospital Events: Including procedures, antibiotic start and stop dates in addition to other pertinent events   . 5/23: Patient admitted. Hypoxic on NRB/HFNC 15 l/m with sats in mid 90s and RR in 30s. Receiving steroids and remdesivir. Azithromycin, Ceftriaxone,  and Vanc started.Intubated and placed on Levo. . 5/24: Patient PEA arrest and placed on 70 Levo after. NaHCO3 started. A-line placed. Solumedrol increased to 80 mg. Vaso and Neo added. Amio started for Afib with RVR and gave Magnesium.  Interim History / Subjective:   Tmax 103.1 with WBC 3.5 On mech vent PRVC 540X30 +8 90% Levo, vaso, neo, and amio running with BP 74/53 Still on insulin drip On no sedation and not following commands  Objective   Blood pressure 96/61, pulse (!) 155, temperature (!) 102.56 F (39.2 C), resp. rate (!) 28, height '6\' 2"'$  (1.88 m), weight 77 kg, SpO2 100 %. CVP:  [2 mmHg-40 mmHg] 27 mmHg  Vent Mode: PRVC FiO2 (%):  [70 %-100 %] 100 % Set Rate:  [28 bmp-30 bmp] 30 bmp Vt Set:  [490 mL-540 mL] 540 mL PEEP:  [8 cmH20] 8 cmH20 Plateau Pressure:  [20 cmH20-21 cmH20] 21 cmH20   Intake/Output Summary (Last 24 hours) at 04/23/2021 0713 Last data filed at April 23, 2021 0610 Gross per 24 hour  Intake 5219.96 ml  Output --  Net 5219.96 ml   Filed Weights   04/20/2021 1420 04/09/2021 1713 2021/04/23 0451  Weight: 90.7 kg 77 kg 77 kg    Examination: General:  Critically ill appearing HEENT: MM pink/moist; ETT in place Neuro: not following commands on no sedation CV: s1s2, no m/r/g, HR 155; BP 74/53 on 3 pressors PULM:  Diminished/crackles bilaterally; On mech vent PRVC 540X30 +8  90%; O2 sats 96% GI: soft, bsx4 active Extremities: warm/dry; RLE amputation below knee Skin: no rashes or lesions   Labs/imaging that I havepersonally reviewed  (right click and "Reselect all SmartList Selections" daily)   K 3.5, BUN 55 from 57, creatinine 3.97 from 3.35, Mag 1.5, Glucose 87 ABG: 7.34, paco2 47, po2 93, hco3 25 CXR: Worsening multifocal pneumonia with possible edema. Trace left pleural effusion. ET tube 7.5 cm above carina. OG tube recommended to be advanced 5 cm. Left IJ in appropriate position.  Resolved Hospital Problem list    S/P PEA arrest 5/24  Assessment &  Plan:   Acute hypoxic respiratory failure POA with Covid-19 pneumonia +/- bacterial super infection vs aspiration pneumonia S/P intubation 04/17/21 Patient received 2 Covid vaccines without the booster. Hx of smoking. (47 Pack year history) Plan: - Continue mech vent: wean O2 for sats >90% - Continue Ceftriaxone; Azithromycin switched to Doxy due to QTC prolongation; Vanc d/c'd since MRSA pcr negative - Tracheal aspirate ordered - Remdesivir 5/23 per pharmacy - CXR am - Steroids increased overnight to 80 IV due to air trapping - Ordered DuoNeb Q4 - Pulmonary Toiletry - VAP prevention - Consider proning if patient more hemodynamically stable - RASS -1; consider starting low dose fentanyl for analgesia as BP allows  Circulatory shock from combination of Septic and Cardiogenic shock with resultant multi organ failure Possible obstructive shock from possible PE; Echo pending Severe Sepsis POA Plan: -Continue pressors (Levo, vaso, neo) for MAP>65; Epi added -Continue BiCarb -Trend electrolytes  -IV fluids -Echo ordered stat -Consider tPA pending Echo results -BC X2, urine culture, urine strep and legionella antigen, resp culture pending -continue ABX above -Trend CBC/fever curve -APAP for fever per pharmacy  Hypomagnesemia Hypokalemia Plan: -Trend BMP/magnesium labs -Replete as necessary  Afib with RVR Plan: -Continue Amio drip -On heparin and ASA -Telemetry monitoring  Possible PE Unable to obtain CTA chest to rule out PE due to AKI and respiratory status Plan: - Continue heparin ppx - Echo ordered stat  AKI POA: likely related to hypovolemia Plan: -continue IV fluids -avoid nephrotoxic agents and renal dose meds -Trend BMP/UO -No dialysis/CRRT per [place]'s discussion with family  Hyperglycemic crisis: DKA vs HHS Hx of insulin dependent DM Likely to worsen with steroids Plan: -Insulin drip stopped and started on basal insulin and SSI -CBG  monitoring -Consider starting tube feeds  Acute Metabolic Encephalopathy: s/p PEA arrest 04-17-21; likely a combination of metabolic encephalopathy and possible anoxia from PEA arrest  Plan: - Avoid sedative medications  - Neuro checks  Pancytopenia Plan: - Trend CBC  Hx of HTN/HLD Plan: -ordered home rosuvastatin -home amlodipine 10 on hold due to hypotension  DNR status -04-17-21 GOC discussion: Family in agreement to continue aggressive care but want the patient to be comfortable. They prefer that if he were to cardiac arrest for [place] not to do compressions. They also do not want him to receive dialysis.  Best practice (right click and "Reselect all SmartList Selections" daily)  Diet:  NPO Pain/Anxiety/Delirium protocol (if indicated): Yes (RASS goal -1) VAP protocol (if indicated): Yes DVT prophylaxis: Systemic AC GI prophylaxis: PPI Glucose control:  SSI Yes and Basal insulin Yes insulin drip Central venous access:  Yes, and it is still needed Arterial line:  Yes, and it is still needed Foley:  Yes, and it is still needed Mobility:  bed rest  PT consulted: N/A Last date of multidisciplinary goals of care discussion [04-17-2021 discussed with wife the severity after  PEA arrest and wife has agreed to DNR and no dialysis. We will continue other supportive measures.] Code Status:  DNR Disposition: ICU  Labs   CBC: Recent Labs  Lab 04/18/2021 1230 May 08, 2021 0024 05-08-2021 0051  WBC 4.2  --  3.5*  NEUTROABS 3.6  --   --   HGB 12.4* 10.5* 9.7*  HCT 37.1* 31.0* 29.8*  MCV 91.8  --  94.3  PLT 107*  --  105*    Basic Metabolic Panel: Recent Labs  Lab 03/28/2021 1230 03/25/2021 1636 03/25/2021 1805 05/08/21 0024 2021/05/08 0051 2021-05-08 0423  NA 132*  --  133* 136 145 141  K 4.1  --  4.2 3.6 3.4* 3.5  CL 101  --  100  --  99 100  CO2 20*  --  20*  --  31 27  GLUCOSE 412*  --  480*  --  219* 89  BUN 57*  --  59*  --  62* 55*  CREATININE 3.35*  --  3.61*  --  3.96* 3.97*   CALCIUM 7.7*  --  7.8*  --  6.6* 6.8*  MG  --  1.5*  --   --   --  1.5*  PHOS  --  3.3  --   --   --   --    GFR: Estimated Creatinine Clearance: 20.7 mL/min (A) (by C-G formula based on SCr of 3.97 mg/dL (H)). Recent Labs  Lab 03/27/2021 1230 04/20/2021 1640 03/30/2021 1805 03/26/2021 1928 05/08/2021 0051  PROCALCITON 49.30  --   --   --   --   WBC 4.2  --   --   --  3.5*  LATICACIDVEN 5.8* 4.2* 4.8* 5.8*  --     Liver Function Tests: Recent Labs  Lab 04/11/2021 1230  AST 18  ALT 19  ALKPHOS 68  BILITOT 0.6  PROT 5.5*  ALBUMIN 2.1*   No results for input(s): LIPASE, AMYLASE in the last 168 hours. No results for input(s): AMMONIA in the last 168 hours.  ABG    Component Value Date/Time   PHART 7.342 (L) 05-08-21 0402   PCO2ART 47.7 05-08-21 0402   PO2ART 93.6 05/08/2021 0402   HCO3 25.1 05-08-21 0402   TCO2 24 2021-05-08 0024   ACIDBASEDEF 0.5 05-08-21 0402   O2SAT 96.9 08-May-2021 0402     Coagulation Profile: No results for input(s): INR, PROTIME in the last 168 hours.  Cardiac Enzymes: No results for input(s): CKTOTAL, CKMB, CKMBINDEX, TROPONINI in the last 168 hours.  HbA1C: Hgb A1c MFr Bld  Date/Time Value Ref Range Status  04/09/2021 04:37 PM 10.4 (H) 4.8 - 5.6 % Final    Comment:    (NOTE) Pre diabetes:          5.7%-6.4%  Diabetes:              >6.4%  Glycemic control for   <7.0% adults with diabetes   01/04/2021 10:53 PM 10.5 (H) 4.8 - 5.6 % Final    Comment:    (NOTE)         Prediabetes: 5.7 - 6.4         Diabetes: >6.4         Glycemic control for adults with diabetes: <7.0     CBG: Recent Labs  Lab 05-08-21 0018 2021/05/08 0230 2021/05/08 0346 05-08-21 0503 May 08, 2021 0653  GLUCAP 197* 157* 120* 98 87    Review of Systems:   As per HPI. Patient was sleepy  so most information obtained from chart and nurse.  Past Medical History:  He,  has a past medical history of Back pain, Diabetes mellitus, Headache(784.0), Hyperlipidemia,  Hypertension, and Osteomyelitis ([place]) (11/2018).   Surgical History:   Past Surgical History:  Procedure Laterality Date  . AMPUTATION Right 09/18/2013   Procedure: Amputation Right Great Toe at [facility]TP;  Surgeon: Newt Minion, MD;  Location: [place];  Service: Orthopedics;  Laterality: Right;  Amputation Right Great Toe at MTP  . AMPUTATION Right 12/05/2018   Procedure: PARTIAL AMPUTATION FIRST RAY RIGHT FOOT;  Surgeon: Edrick Kins, DPM;  Location: [place];  Service: Podiatry;  Laterality: Right;  . AMPUTATION Right 01/11/2021   Procedure: AMPUTATION BELOW KNEE;  Surgeon: Newt Minion, MD;  Location: [place];  Service: Orthopedics;  Laterality: Right;  . APPLICATION OF WOUND VAC Right 01/11/2021   Procedure: APPLICATION OF WOUND VAC;  Surgeon: Newt Minion, MD;  Location: [place];  Service: Orthopedics;  Laterality: Right;  . BONE BIOPSY Right 01/06/2021   Procedure: BONE BIOPSY;  Surgeon: Edrick Kins, DPM;  Location: [facility];  Service: Podiatry;  Laterality: Right;  . CARDIAC CATHETERIZATION  ?1990  . INCISION AND DRAINAGE Right 01/06/2021   Procedure: INCISION AND DRAINAGE, ANTIBIOTIC BEAD PLACEMENT;  Surgeon: Edrick Kins, DPM;  Location: [facility];  Service: Podiatry;  Laterality: Right;  . SPINE SURGERY       Social History:   reports that he has been smoking cigarettes. He has a 40.00 pack-year smoking history. He has never used smokeless tobacco. He reports previous alcohol use. He reports previous drug use.   Family History:  His family history includes Cancer in his mother; Heart disease in his father.   Allergies Allergies  Allergen Reactions  . Bactrim [Sulfamethoxazole-Trimethoprim] Nausea And Vomiting  . Doxycycline Nausea And Vomiting  . Oxycodone     History of addiction   . Vicodin [Hydrocodone-Acetaminophen] Itching     Home Medications  Prior to Admission medications   Medication Sig Start Date End Date Taking? Authorizing Provider  acetaminophen (TYLENOL) 325 MG  tablet Take 1-2 tablets (325-650 mg total) by mouth every 6 (six) hours as needed for mild pain (pain score 1-3 or temp > 100.5). 01/16/21  Yes Barb Merino, MD  acetaminophen (TYLENOL) 650 MG CR tablet Take 650 mg by mouth every 8 (eight) hours as needed for pain.   Yes [provider]  amLODipine (NORVASC) 10 MG tablet Take 1 tablet (10 mg total) by mouth daily. 01/27/21  Yes Angiulli, Lavon Paganini, PA-C  aspirin 81 MG tablet Take 81 mg by mouth at bedtime.    Yes [provider]  bismuth subsalicylate (PEPTO BISMOL) 262 MG/15ML suspension Take 30 mLs by mouth every 6 (six) hours as needed (nausea).   Yes [provider]  chlorthalidone (HYGROTON) 25 MG tablet Take 12.5 mg by mouth at bedtime.   Yes [provider]  docusate sodium (COLACE) 100 MG capsule Take 1 capsule (100 mg total) by mouth 2 (two) times daily. Patient taking differently: Take 100 mg by mouth 2 (two) times daily as needed for mild constipation. 01/27/21  Yes Angiulli, Lavon Paganini, PA-C  FIASP FLEXTOUCH 100 UNIT/ML FlexTouch Pen Inject 12 Units into the skin 3 (three) times daily as needed (high blood sugar). 03/12/21  Yes [provider]  insulin glargine (LANTUS) 100 UNIT/ML Solostar Pen Inject 20 Units into the skin daily. Patient taking differently: Inject 20 Units into the skin  at bedtime. 01/27/21  Yes Angiulli, Lavon Paganini, PA-C  methocarbamol (ROBAXIN) 500 MG tablet Take 1 tablet (500 mg total) by mouth every 6 (six) hours as needed for muscle spasms. 01/27/21  Yes Angiulli, Lavon Paganini, PA-C  polyethylene glycol (MIRALAX / GLYCOLAX) 17 g packet Take 17 g by mouth daily as needed for mild constipation (PRN for DAYTIME - has separate Senokot-S for bedtime prn). 01/16/21  Yes Barb Merino, MD  pregabalin (LYRICA) 50 MG capsule Take 1 capsule (50 mg total) by mouth 3 (three) times daily. Patient taking differently: Take 50 mg by mouth 2 (two) times daily. 01/27/21  Yes Angiulli, Lavon Paganini, PA-C   rosuvastatin (CRESTOR) 20 MG tablet Take 1 tablet (20 mg total) by mouth daily. Patient taking differently: Take 20 mg by mouth at bedtime. 01/27/21  Yes Angiulli, Lavon Paganini, PA-C  sodium bicarbonate 650 MG tablet Take 2 tablets (1,300 mg total) by mouth 3 (three) times daily. Patient taking differently: Take 1,300 mg by mouth 2 (two) times daily. 01/27/21  Yes Angiulli, Lavon Paganini, PA-C  doxycycline (VIBRA-TABS) 100 MG tablet Take 1 tablet (100 mg total) by mouth 2 (two) times daily. Patient not taking: Reported on 03/28/2021 02/15/21   Newt Minion, MD  iron polysaccharides (NIFEREX) 150 MG capsule Take 1 capsule (150 mg total) by mouth daily. Patient not taking: Reported on 04/01/2021 01/27/21   Angiulli, Lavon Paganini, PA-C  Omega-3 Fatty Acids (FISH OIL) 500 MG CAPS Take 1 capsule (500 mg total) by mouth at bedtime. Patient not taking: Reported on 04/17/2021 01/27/21   Angiulli, Lavon Paganini, PA-C  oxyCODONE-acetaminophen (PERCOCET/ROXICET) 5-325 MG tablet Take 1 tablet by mouth every 4 (four) hours as needed for severe pain. Patient not taking: No sig reported 02/15/21   Persons, Bevely Palmer, PA  sevelamer carbonate (RENVELA) 800 MG tablet Take 1 tablet (800 mg total) by mouth 3 (three) times daily with meals. Patient not taking: Reported on 04/17/2021 01/27/21   Cathlyn Parsons, PA-C  sulfamethoxazole-trimethoprim (BACTRIM DS) 800-160 MG tablet Take 1 tablet by mouth 2 (two) times daily. Patient not taking: No sig reported 03/01/21   Persons, Bevely Palmer, PA  tamsulosin (FLOMAX) 0.4 MG CAPS capsule Take 1 capsule (0.4 mg total) by mouth daily after supper. Patient not taking: Reported on 04/07/2021 01/27/21   Angiulli, Lavon Paganini, PA-C  traZODone (DESYREL) 50 MG tablet Take 0.5-1 tablets (25-50 mg total) by mouth at bedtime. Patient not taking: Reported on 04/10/2021 01/27/21   Cathlyn Parsons, PA-C  witch hazel-glycerin (TUCKS) pad Apply topically as needed for hemorrhoids. Patient not taking: Reported on 03/27/2021  01/27/21   Cathlyn Parsons, PA-C     Critical care time: 73    JD Rexene Agent [facility] 21-Apr-2021, 7:13 AM  Please see [email or web address] for pager details.  From 7A-7P if no response, please call [phone]. After hours, please call ELink [phone].

## 2021-04-21 NOTE — Progress Notes (Signed)
Daughter at bedside and wife Is on tele visit. Explained to them about the patient being on multiple pressors and asked they're feelings on starting back the fentanyl to provide comfort for the patient. They both agreed that they would like for that to happen. See MAR for Fentanyl administration.

## 2021-04-21 NOTE — Progress Notes (Signed)
Assisted tele visit to patient with wife.  Marisha Renier, Theodosia Paling, RN

## 2021-04-21 NOTE — H&P (Signed)
Please see consult note from 5/23 for full H&P

## 2021-04-21 NOTE — Progress Notes (Signed)
Talked to wife this morning to give update prior to end of shift @ [phone].  I let her know that the day MD would be calling her.  She stated her son is coming from [place] and would arrive today as well as his niece.

## 2021-04-21 NOTE — TOC Initial Note (Signed)
Transition of Care [facility]) - Initial/Assessment Note    Patient Details  Name: Fernando Anthony MRN: [id number] Date of Birth: [birth year]  [facility] CM/SW Contact:    Leeroy Cha, RN Phone Number: 2021/04/26, 9:28 AM  Clinical Narrative:                 65 year old gentleman with tobacco use disorder and 40-pack-year smoking history, vaccinated with 2 doses of the COVID-vaccine.  Here with worsening shortness of breath and respiratory failure with COVID-19 infection.  He had been seen in primary care televisit and apparently looked so terrible that the APP instructed him to go straight to the ED.  Prior to this he had refused to go to the ED when EMS was called.  This is been going on for a few days now.    Objective:      Vitals:     03/26/2021 1654  04/09/2021 1722   BP:  (!) 180/98  (!) 150/80   Pulse:  (!) 112  (!) 116   Resp:  (!) 41  (!) 40   Temp:  100 F (37.8 C)  98.6 F (37 C)   SpO2:  95%  96%       Gen:          Respiratory distress, appears acutely ill  HEENT: Dry mucous membranes  Lungs:    Unable to complete full sentences, and respiratory distress, diminished breath sounds and air entry bilaterally  CV:         Tachycardic, regular  Abd:          + bowel sounds; soft, non-tender; no palpable masses, no distension  Ext:           No edema; adequate peripheral perfusion  Skin:       Warm and dry; no rash  Neuro:    alert and oriented x 3  Psych:    Fatigued, normal mood, appears concerned    Labs/Imaging:  Chest xray with bilateral multifocal airspace opacities  Covid 19 positive  Elevated didimer  Hyperglycemia  l    Assessment and Plan:  Acute hypoxemic respiratory failure  Severe ARDS  Covid 19 Pneumonia  Tobacco use disorder  Type 2 DM with mild DKA  Lactic acidosis  Hypomagnesemia  Hyponatremia    He has worsening respiratory failure now maximized on nonrebreather and nasal cannula.  Due to his work of breathing he will  require intubation.  Will follow low tidal volume ventilation and lung protective strategies.  He is getting treated with steroids and MDI severe protocol.  He will be on heparin drip empirically with an elevated D-dimer with concern for VTE.  Continue empiric antibiotics for now.  Insulin drip for his hyperglycemia.  I have discussed his care with his wife [name] who also has COVID infection and is hospitalized.  She is aware of the seriousness of the situation.  Full code for now.  Verified with the patient.   patient is now dnr and on the vent as of 04/02/2021 at 7pm, iv sedated, multiple pressors, covid positive on remdesivir through [id number] amiodarone, iv solu medrol, pogoniasis is poor. Family is aware.      Barriers to Discharge: Continued Medical Work up   Patient Goals and CMS Choice        Expected Discharge Plan and Services         Living arrangements for the past 2 months: Single Family Home Expected Discharge Date:  (  unknown)                                    Prior Living Arrangements/Services Living arrangements for the past 2 months: Single Family Home Lives with:: Spouse                   Activities of Daily Living Home Assistive Devices/Equipment: Electric scooter,Shower chair with back,Walker (specify type),Other (Comment),CBG Meter,Eyeglasses (stocking shrinker for right bka, rolling walker, ramp entrance into house) ADL Screening (condition at time of admission) Patient's cognitive ability adequate to safely complete daily activities?: Yes (patient lethargic) Is the patient deaf or have difficulty hearing?: No Does the patient have difficulty seeing, even when wearing glasses/contacts?: No Does the patient have difficulty concentrating, remembering, or making decisions?: Yes Patient able to express need for assistance with ADLs?: Yes Does the patient have difficulty dressing or bathing?: Yes Independently performs ADLs?: No Communication:  Independent Dressing (OT): Needs assistance Is this a change from baseline?: Change from baseline, expected to last >3 days Grooming: Independent Feeding: Independent Bathing: Needs assistance Is this a change from baseline?: Change from baseline, expected to last >3 days Toileting: Needs assistance Is this a change from baseline?: Change from baseline, expected to last >3days In/Out Bed: Needs assistance Is this a change from baseline?: Change from baseline, expected to last >3 days Walks in Home: Needs assistance Is this a change from baseline?: Change from baseline, expected to last >3 days Does the patient have difficulty walking or climbing stairs?: Yes (secondary to weakness) Weakness of Legs: Left (Right BKA) Weakness of Arms/Hands: None  Permission Sought/Granted                  Emotional Assessment Appearance:: Appears stated age Attitude/Demeanor/Rapport: Intubated (Following Commands or Not Following Commands) Affect (typically observed): Unable to Assess Orientation: : Fluctuating Orientation (Suspected and/or reported Sundowners) Alcohol / Substance Use: Tobacco Use Psych Involvement: No (comment)  Admission diagnosis:  Acute respiratory failure with hypoxia (HCC) [J96.01] Acute hypoxemic respiratory failure (HCC) [J96.01] Acute COVID-19 [U07.1] Acute respiratory distress syndrome (ARDS) due to COVID-19 virus ([place]) [U07.1, J80] Patient Active Problem List   Diagnosis Date Noted  . Acute hypoxemic respiratory failure ([place]) 04/11/2021  . Acute respiratory distress syndrome (ARDS) due to COVID-19 virus ([place]) 04/19/2021  . Acute COVID-19   . Bradycardia   . Right below-knee amputee ([place]) 01/16/2021  . Acute osteomyelitis of right calcaneus ([place])   . Severe protein-calorie malnutrition ([place])   . AKI (acute kidney injury) ([place]) 01/04/2021  . Anemia 01/04/2021  . History of transmetatarsal amputation of right foot ([place]) 06/11/2019  . Neuropathic pain of foot,  right 06/11/2019  . Memory difficulties 01/18/2019  . Psychophysiological insomnia 01/18/2019  . Hyperlipidemia associated with type 2 diabetes mellitus ([place]) 12/13/2018  . Hypertension associated with type 2 diabetes mellitus ([place]) 12/13/2018  . 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 Osteomyelitis of right foot ([place]) 12/04/2018  . Hypertensive urgency 12/04/2018  . Abnormal LFTs 12/04/2018  . Hyponatremia 12/04/2018  . Osteomyelitis ([place]) 12/04/2018  . Hypertension, essential, benign 08/20/2017  . Hypokalemia 08/20/2017  . 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 Lumbar post-laminectomy syndrome 09/16/2014  . Diabetic neuropathy ([place]) 04/04/2013  . Postlaminectomy syndrome, lumbar region 05/18/2012  . Uncontrolled diabetes mellitus with diabetic nephropathy ([place]) 05/18/2012   PCP:  Corrington, Delsa Grana, MD Pharmacy:   [id number] [place] [id number]: [phone]: [phone]    Social Determinants of Health (SDOH) Interventions    Readmission Risk Interventions No flowsheet data found.

## 2021-04-21 NOTE — Progress Notes (Addendum)
[facility] Progress Note Patient Name: Fernando Anthony DOB: 09/18/1956 MRN: [id number]   Date of Service  2021-05-06  HPI/Events of Note  Patient given Decadron 6 mg IV X 1 in ED for his COVID. However, steroids not continues. Started on Solumedrol 40 mg IV Q 6 hours for COPD. Febrile, nursing request for Tylenol. However, he is allergic to Hydrocodone/Acetominophen.  eICU Interventions  Plan: 1. Will change Solumedrol dose to 80 mg IV Q 8 hours. 2. Ice packs PRN.     Intervention Category Major Interventions: Other:  [name] Cornelia Copa 2021/05/06, 2:13 AM

## 2021-04-21 NOTE — Progress Notes (Signed)
Assisted tele visit to patient with son.  Lahari Suttles, Theodosia Paling, RN

## 2021-04-21 NOTE — Progress Notes (Signed)
[facility] Progress Note Patient Name: Fernando Anthony DOB: December 18, 1955 MRN: [id number]   Date of Service  Apr 15, 2021  HPI/Events of Note  ABG on 100%/PRVC 28/TV 490/P 8 = 6.88/109/143/21.  eICU Interventions  Plan: 1. I-time decreased by RT to allow increase TV to 540 and RR to 30 before starting to air trap.  2. NaHCO3 200 meq IV now. 3. Repeat ABG at 1:45 AM. 4. Place A-line.     Intervention Category Major Interventions: Acid-Base disturbance - evaluation and management;Respiratory failure - evaluation and management  Lysle Dingwall 04/15/21, 12:38 AM

## 2021-04-21 NOTE — Progress Notes (Signed)
Turn vent off and left PT intubated with capped ETT at this time.

## 2021-04-21 NOTE — Progress Notes (Signed)
[facility] Progress Note Patient Name: Fernando Anthony DOB: August 14, 1956 MRN: [id number]   Date of Service  April 26, 2021  HPI/Events of Note  AFIB with RVR - Ventricular rate = 170. Norepinephrine IV infusion at high dose not helping ventricular rate. Patient already on a Heparin IV infusion.  eICU Interventions  Plan: 1. Wean Norepinephrine IV infusion as tolerated.  2. Send AM labs now. 3. Mg++ level STAT. 4. Amiodarone IV load and infusion.       Intervention Category Major Interventions: Arrhythmia - evaluation and management  Ladonna Vanorder Eugene 2021-04-26, 4:27 AM

## 2021-04-21 NOTE — Progress Notes (Signed)
ANTICOAGULATION CONSULT NOTE - Follow Up Consult  Pharmacy Consult for Heparin Indication: Afib, r/o PE  Allergies  Allergen Reactions  . Bactrim [Sulfamethoxazole-Trimethoprim] Nausea And Vomiting  . Doxycycline Nausea And Vomiting  . Oxycodone     History of addiction   . Vicodin [Hydrocodone-Acetaminophen] Itching    Patient Measurements: Height: '6\' 2"'$  (188 cm) Weight: 77 kg (169 lb 12.1 oz) IBW/kg (Calculated) : 82.2 Heparin Dosing Weight: TBW   Vital Signs: Temp: 101.84 F (38.8 C) (05/24 0917) Temp Source: Bladder (05/24 0800) BP: 71/44 (05/24 0900) Pulse Rate: 155 (05/24 0200)  Labs: Recent Labs    04/04/2021 1230 04/18/2021 1805 Apr 18, 2021 0024 Apr 18, 2021 0051 04/18/21 0240 2021-04-18 0423 04/18/2021 0807  HGB 12.4*  --  10.5* 9.7*  --   --   --   HCT 37.1*  --  31.0* 29.8*  --   --   --   PLT 107*  --   --  105*  --   --   --   HEPARINUNFRC  --   --   --  0.30  --   --   --   CREATININE 3.35* 3.61*  --  3.96*  --  3.97*  --   TROPONINIHS  --   --   --  62* 112*  --  269*    Estimated Creatinine Clearance: 20.7 mL/min (A) (by C-G formula based on SCr of 3.97 mg/dL (H)).   Medications:  Infusions:  . sodium chloride Stopped (18-Apr-2021 0916)  . amiodarone 60 mg/hr (04-18-21 0928)   Followed by  . amiodarone    . azithromycin Stopped (04/03/2021 2301)  . cefTRIAXone (ROCEPHIN)  IV    . epinephrine 17.5 mcg/min (18-Apr-2021 0928)  . fentaNYL infusion INTRAVENOUS Stopped (2021/04/18 0425)  . heparin 1,500 Units/hr (04-18-2021 0928)  . lactated ringers Stopped (04/05/2021 2310)  . norepinephrine (LEVOPHED) Adult infusion 70 mcg/min (04/18/2021 0928)  . phenylephrine (NEO-SYNEPHRINE) Adult infusion 400 mcg/min (04/18/2021 0928)  . remdesivir 100 mg in NS 100 mL 200 mL/hr at Apr 18, 2021 0928  .  sodium bicarbonate (isotonic) infusion in sterile water 125 mL/hr at 18-Apr-2021 0928  . vasopressin 0.03 Units/min (2021/04/18 [id number])    Assessment: 65 yoM presented to ED on 5/23, COVID  positive with acute respiratory failure.  Pharmacy is consulted to dose heparin IV for r/o PE, also for new onset AFib.  Today, 04/18/2021:  Heparin level 0.8, supratherapeutic on heparin 1500 units/hr CBC:  Hgb down to 9.7, Plt remain low/stable at 105k  No bleeding or complications reported.  SCr 3.97 and increasing  D-dimer 17  Goal of Therapy:  Heparin level 0.3-0.7 units/ml Monitor platelets by anticoagulation protocol: Yes   Plan:  Decrease to heparin IV infusion at 1300 units/hr Heparin level in 8 hours. Daily heparin level and CBC   Gretta Arab PharmD, BCPS Clinical Pharmacist [facility] main pharmacy [phone] 04-18-2021 9:47 AM

## 2021-04-21 NOTE — Progress Notes (Signed)
CPT and sputum sample not completed at this time as advised by RN.

## 2021-04-21 NOTE — Progress Notes (Signed)
Two RN's pronounced death. Time of death 11. Daughter at bedside and wife contacted to make awre

## 2021-04-21 NOTE — Progress Notes (Signed)
I spoke w/ His wife as well as his extended family here at the bedside and over the phone. We discussed all events of the night and current clinical findings. He is in refractory shock which seems to be mix of sepsis and cardiogenic component w/ resultant multiple organ failure. We discussed treatment options. I do not think on 4 vasoactive gtts CPR is appropriate and his family agrees with this. They understand that he is unlikely to survive.  Plan Checking stat ECHO looking for RV dyfxn. Would consider TPA as last ditch effort if has RV failure supporting PE as reason for circulatory collapse. Family is in agreement with this Added epinephrine (no on norepi, neo, vasopresson w/ Epi making 4th agent).  Wife tells me he would not want dialysis. We will respect that Full DNR Adding low dose pressors.    I will update his wife again later today   Erick Colace ACNP-BC [facility] Pager # [phone] OR # [phone] if no answer

## 2021-04-21 NOTE — Progress Notes (Signed)
Attempted times 2 to retrieve sputum sample with lavage- resulted in zero sputum at this time. RN aware.

## 2021-04-21 NOTE — Progress Notes (Addendum)
[facility] Progress Note Patient Name: Fernando Anthony DOB: 07/27/1956 MRN: [id number]   Date of Service  2021/04/23  HPI/Events of Note  Code Note: Patient became hypotensive subsequently had a PEA cardiac arrest when Norepinephrine IV infusion ran out. CPR for 7 minutes prior to ROSC.   eICU Interventions  Plan: 1. 12 Lead EKG STAT. 2. Cycle Troponin. 3. ABG STAT. 4. Increase ceiling on Norepinephrine IV infusion to 70 mcg/min.      Intervention Category Major Interventions: Code management / supervision  [name] Cornelia Copa 04-23-2021, 12:19 AM

## 2021-04-21 NOTE — Progress Notes (Signed)
Inpatient Diabetes Program Recommendations  AACE/ADA: New Consensus Statement on Inpatient Glycemic Control (2015)  Target Ranges:  Prepandial:   less than 140 mg/dL      Peak postprandial:   less than 180 mg/dL (1-2 hours)      Critically ill patients:  140 - 180 mg/dL   Results for Fernando Anthony, Fernando Anthony (MRN [id number]) as of 2021-05-09 06:48  Ref. Range 01/04/2021 22:53 04/06/2021 16:37  Hemoglobin A1C Latest Ref Range: 4.8 - 5.6 % 10.5 (H) 10.4 (H)  (251 mg/dl)   Results for [name], [name] (MRN [id number]) as of 2021-05-09 06:48  Ref. Range 04/09/2021 16:28 03/27/2021 17:38 04/09/2021 18:05 04/03/2021 18:35 03/28/2021 19:21 03/27/2021 20:23 04/11/2021 21:09 03/23/2021 22:40 05/09/21 00:18 2021/05/09 02:30 05-09-2021 03:46 2021/05/09 05:03  Glucose-Capillary Latest Ref Range: 70 - 99 mg/dL 463 (H) 423 (H)  IV Insulin Drip Started 513 (HH)  IV Insulin Drip 415 (H)  IV Insulin Drip 402 (H)  IV Insulin Drip 433 (H)  IV Insulin Drip 349 (H)  IV Insulin Drip 219 (H)  IV Insulin Drip 197 (H)  IV Insulin Drip 157 (H)  IV Insulin Drip 120 (H)  IV Insulin Drip 98  IV Insulin Drip    Admit with: Acute hypoxemic respiratory failure  Severe ARDS  Covid 19 Pneumonia DKA   History: DM   Home DM Meds: Fiasp 12 units TID        Lantus 20 units QHS   Current Orders: IV Insulin Drip   MD- Note 4:23am BMET shows the following: Glucose 89 mg/dl Anion Gap 14 CO2 level 27  When pt allowed to transition to SQ Insulin, please consider the following: --Start Lantus 20 units Daily (home dose)--Make sure to start Lantus at least 2 hours prior to d/c of the IV Insulin Drip --Start Novolog Moderate Correction Scale/ SSI (0-15 units) Q4 hours    --Will follow patient during hospitalization--  Wyn Quaker RN, MSN, CDE Diabetes Coordinator Inpatient Glycemic Control Team Team Pager: [phone] (8a-5p)

## 2021-04-21 NOTE — Death Summary Note (Signed)
DEATH SUMMARY   Patient Details  Name: Fernando Anthony MRN: [id number] DOB: Sep 10, 1956  Admission/Discharge Information   Admit Date:  04/14/2021  Date of Death: Date of Death: Apr 15, 2021  Time of Death: Time of Death: 65  Length of Stay: 1  Referring Physician: Curly Rim, MD   Reason(s) for Hospitalization  COVID 19  Diagnoses  Preliminary cause of death: COVID 19 Pneumonia Secondary Diagnoses (including complications and co-morbidities):  Active Problems:   Acute renal failure (HCC)   Acute hypoxemic respiratory failure (HCC)   Acute respiratory distress syndrome (ARDS) due to COVID-19 virus (HCC)   Septic shock [facility])   Brief Hospital Course (including significant findings, care, treatment, and services provided and events leading to death)  Fernando Anthony was a 65 y.o. year old male with a pertinent PMH of tobacco use disorder, IDDM, HTN, HLD, CKD stage IV, and Osteomyelitis s/p amputation 12/2020 right leg below knee who presented to [facility] on 04-15-23 with increasing shortness of breath with hypoxia.   Patient states sob started on 6 days of cough and weakness. On 5/20, he and his wifetook home COVID-19 test which were both positive. His cough and work of breathing worsened the past few days. Decreased appetite. He had a primary care televist and was instructed to go to the ED based on his appearance on the video visit. He developed worsening SOB and vomiting on 2023-04-15 which brought him to [facility] ED.  Patient present to the ED with RR in 43s and hypoxic. Placed on NRB/HFNC 15 l/m with sats mid 90s. Normal sinus tach on EKG. BP 158/94. ED course labs/tests: Na 132, K 4.1, co2 20, glucose 412; Bun 57, creatinine 3.35; Anion gap 11, albumin 2.1; LDH 200, CRP 27.6, Lactic acid 5.8, procalcitonin 49.3; ABG: 7.39, paco2 29.7, po2 74.5, hco3 17.8; CXR: multifocal pneumonia worse on left.   PCCM consulted for acute hypoxic respiratory failure. Patient was switched to high flow oxygen but  had persistent tachypnea. He was intubated immediately after arrival to the ICU due to respiratory failure. He was started on remdesevir and steroids for covid infection, as well as empiric broad spectrum antibiotics.  Started on empiric heparin with concern for PE. Soon after intubation he became hypotensive requiring vasopressor support.  Unfortunately over the next 6 hours he suffered hemodynamic collapse and suffered a cardiac arrest. ROSC was achieved in 7 minutes. He continued to decline over the course of the morning. Stat echocardiogram showed biventricular heart failure - new from previous echo 3 months prior. He had family come to the bedside, and his wife and son were able to televisit. They elected to make him DNR and transitioned to comfort measures. He died with his family present at the bedside and virtually.    Pertinent Labs and Studies  Significant Diagnostic Studies DG Abd 1 View  Result Date: 04/14/2021 CLINICAL DATA:  OG tube advancement EXAM: ABDOMEN - 1 VIEW COMPARISON:  02/10/2021, 7:00 p.m. FINDINGS: Slight interval advancement of esophagogastric tube. Tip remains below the diaphragm, side port remains at approximately the level of the gastroesophageal junction. Recommend at least 5 cm additional advancement to ensure subdiaphragmatic positioning of side port. Very dense, heterogeneous airspace opacity, left greater than right in the included lung bases. IMPRESSION: 1. Slight interval advancement of esophagogastric tube. Tip remains below the diaphragm, side port remains at approximately the level of the gastroesophageal junction. 2. Recommend at least 5 cm additional advancement to ensure subdiaphragmatic positioning of side port. Electronically Signed  By: Eddie Candle M.D.   On: 04/05/2021 21:29   DG Abd 1 View  Result Date: 04/15/2021 CLINICAL DATA:  Orogastric tube placement EXAM: ABDOMEN - 1 VIEW COMPARISON:  None. FINDINGS: Nasogastric tube tip seen just within the  proximal body of the stomach with its distal side hole position within the distal esophagus. The abdominal gas pattern is indeterminate due to a paucity of intra-abdominal gas and exclusion of the pelvis from view. No organomegaly. Extensive airspace infiltrate noted at the left lung base. IMPRESSION: Nasogastric tube tip within the proximal body of the stomach. Advancement by 5-10 cm may better position the catheter. Electronically Signed   By: Fidela Salisbury MD   On: 03/29/2021 19:56   Portable chest x-ray (1 view)  Result Date: 04-28-21 CLINICAL DATA:  Respiratory failure.  COVID. EXAM: PORTABLE CHEST 1 VIEW COMPARISON:  Chest x-ray 04/16/2021 FINDINGS: Endotracheal tube with tip 7.5 cm above the carina. Enteric tube coursing below the hemidiaphragm with tip collimated off view and side port likely in the region of the gastroesophageal junction. Left internal jugular central venous catheter with tip overlying the expected region of the superior vena cava and left brachiocephalic vein confluence. Cardiac paddles overlie the patient. The heart size and mediastinal contours are unchanged. Interval increase in perihilar airspace opacities, right greater than left. No pulmonary edema. Trace left pleural effusion. No right pleural effusion. No pneumothorax. No acute osseous abnormality. IMPRESSION: 1. Interval worsening of multifocal pneumonia with possible superimposed edema. 2. Trace left pleural effusion. 3. Endotracheal tube with tip 7.5 cm above the carina. Can consider advancing by 1-2 cm. 4. Enteric tube coursing below the hemidiaphragm with tip collimated off view and side port likely in the region of the gastroesophageal junction. Recommend advancing by 5 cm. 5. Left internal jugular central venous catheter in appropriate stable position. Electronically Signed   By: Iven Finn M.D.   On: 04-28-21 05:48   Portable Chest x-ray  Result Date: 04/07/2021 CLINICAL DATA:  Respiratory failure EXAM:  PORTABLE CHEST 1 VIEW COMPARISON:  04/20/2021 FINDINGS: Endotracheal tube is been placed 7.2 cm above the carina with its tip at the level of the clavicular heads. Nasogastric tube extends into the upper abdomen beyond the margin of the examination. Distal side hole position within the distal esophagus. Left internal jugular central venous catheter tip within the proximal superior vena cava. The lungs are symmetrically well expanded. Pulmonary insufflation is stable since prior examination. Extensive airspace infiltrate, predominantly within the left mid and lower lung zone, is again seen and appears progressive since prior examination in keeping with progressive pneumonic infiltrate. No pneumothorax or pleural effusion. Cardiac size within normal limits. Pulmonary vascularity is normal. No acute bone abnormality. IMPRESSION: Endotracheal tube in appropriate position. Nasogastric tube proximal side hole within the distal esophagus. Advancement by 5-10 cm may better position the catheter. Left internal jugular central venous catheter tip in appropriate position. No pneumothorax. Progressive multifocal pneumonic infiltrate, predominantly within the left mid and lower lung zone. Electronically Signed   By: Fidela Salisbury MD   On: 03/29/2021 19:55   DG Chest Port 1 View  Result Date: 03/27/2021 CLINICAL DATA:  Shortness of breath.  COVID-19 positive patient. EXAM: PORTABLE CHEST 1 VIEW COMPARISON:  Single-view of the chest 01/24/2021. PA and lateral chest 09/18/2013 and 01/04/2021. FINDINGS: The patient has bilateral airspace disease which is worse in the left mid and lower lung zones. Right perihilar opacity is identified. No pneumothorax or pleural effusion. Heart size is  normal. No acute or focal bony abnormality. IMPRESSION: Multifocal pneumonia, worse on the left. Electronically Signed   By: Inge Rise M.D.   On: 04/20/2021 14:06   ECHOCARDIOGRAM COMPLETE  Result Date: 2021/04/19    ECHOCARDIOGRAM  REPORT   Patient Name:   Fernando Anthony Date of Exam: 2021-04-19 Medical Rec #:  [id number]     Height:       74.0 in Accession #:    [id number]    Weight:       169.8 lb Date of Birth:  20-Dec-1955     BSA:          2.027 m Patient Age:    63 years      BP:           44/27 mmHg Patient Gender: M             HR:           76 bpm. Exam Location:  Inpatient Procedure: 2D Echo, Cardiac Doppler and Color Doppler STAT ECHO Indications:    I26.02 Pulmonary embolus  History:        Patient has prior history of Echocardiogram examinations, most                 recent 01/10/2021. Signs/Symptoms:Hypotension and Shortness of                 Breath. Patient has Covid 19. Suspected PE. Very poor prognosis.  Sonographer:    [name] RDCS Referring Phys: [id number] [place]  1. Left ventricular ejection fraction, by estimation, is 20 to 25%. The left ventricle has severely decreased function. The left ventricle demonstrates global hypokinesis. Left ventricular diastolic parameters are indeterminate.  2. Mid to apical right ventricular akinesis. Right ventricular systolic function is moderately reduced. The right ventricular size is mildly enlarged.  3. The mitral valve is normal in structure. No evidence of mitral valve regurgitation. No evidence of mitral stenosis.  4. The aortic valve is normal in structure. Aortic valve regurgitation is not visualized. No aortic stenosis is present.  5. The inferior vena cava is dilated in size with <50% respiratory variability, suggesting right atrial pressure of 15 mmHg. FINDINGS  Left Ventricle: Left ventricular ejection fraction, by estimation, is 20 to 25%. The left ventricle has severely decreased function. The left ventricle demonstrates global hypokinesis. The left ventricular internal cavity size was normal in size. There is no left ventricular hypertrophy. Left ventricular diastolic parameters are indeterminate. Right Ventricle: Mid to apical right ventricular akinesis. The  right ventricular size is mildly enlarged. No increase in right ventricular wall thickness. Right ventricular systolic function is moderately reduced. Left Atrium: Left atrial size was normal in size. Right Atrium: Right atrial size was normal in size. Pericardium: There is no evidence of pericardial effusion. Mitral Valve: The mitral valve is normal in structure. No evidence of mitral valve regurgitation. No evidence of mitral valve stenosis. Tricuspid Valve: The tricuspid valve is normal in structure. Tricuspid valve regurgitation is trivial. No evidence of tricuspid stenosis. Aortic Valve: The aortic valve is normal in structure. Aortic valve regurgitation is not visualized. No aortic stenosis is present. Pulmonic Valve: The pulmonic valve was normal in structure. Pulmonic valve regurgitation is not visualized. No evidence of pulmonic stenosis. Aorta: The aortic root is normal in size and structure. Venous: The inferior vena cava is dilated in size with less than 50% respiratory variability, suggesting right atrial pressure of 15 mmHg. IAS/Shunts: No atrial  level shunt detected by color flow Doppler.  RIGHT VENTRICLE          IVC RV Basal diam:  2.84 cm  IVC diam: 2.34 cm LEFT ATRIUM           Index       RIGHT ATRIUM           Index LA Vol (A4C): 31.0 ml 15.29 ml/m RA Area:     13.50 cm                                   RA Volume:   38.50 ml  18.99 ml/m Candee Furbish MD Electronically signed by Candee Furbish MD Signature Date/Time: May 05, 2021/1:01:36 PM    Final    US BIOPSY (KIDNEY)  Result Date: 03/24/2021 INDICATION: Proteinuria of uncertain etiology. Please perform ultrasound-guided random renal biopsy for tissue purposes. EXAM: ULTRASOUND GUIDED RENAL BIOPSY COMPARISON:  Renal ultrasound-01/05/2021 MEDICATIONS: None. ANESTHESIA/SEDATION: Fentanyl 100 mcg IV; Versed 2 mg IV Total Moderate Sedation time: 14 minutes; The patient was continuously monitored during the procedure by the interventional radiology  nurse under my direct supervision. COMPLICATIONS: SIR Level A - No therapy, no consequence. Note is made of a trace asymptomatic left-sided perinephric hematoma. PROCEDURE: Informed written consent was obtained from the patient after a discussion of the risks, benefits and alternatives to treatment. The patient understands and consents the procedure. A timeout was performed prior to the initiation of the procedure. Ultrasound scanning was performed of the bilateral flanks. The inferior pole of the left kidney was selected for biopsy due to location and sonographic window. The procedure was planned. The operative site was prepped and draped in the usual sterile fashion. The overlying soft tissues were anesthetized with 1% lidocaine with epinephrine. A 17 gauge core needle biopsy device was advanced into the inferior cortex of the left kidney and 3 core biopsies were obtained under direct ultrasound guidance. Images were saved for documentation purposes. The biopsy device was removed and hemostasis was obtained with manual compression. Post procedural scanning demonstrated a trace left-sided perinephric hematoma without additional complication. A dressing was applied. The patient tolerated the procedure well without immediate post procedural complication. IMPRESSION: Technically successful ultrasound guided left renal biopsy. Electronically Signed   By: Sandi Mariscal M.D.   On: 03/24/2021 11:56    Microbiology Recent Results (from the past 240 hour(s))  Resp Panel by RT-PCR (Flu A&B, Covid) Nasopharyngeal Swab     Status: Abnormal   Collection Time: 04/07/2021 12:30 PM   Specimen: Nasopharyngeal Swab; Nasopharyngeal(NP) swabs in vial transport medium  Result Value Ref Range Status   SARS Coronavirus 2 by RT PCR POSITIVE (A) NEGATIVE Final    Comment: RESULT CALLED TO, READ BACK BY AND VERIFIED WITH: S. WEST AT 1618 ON 03/28/2021 BY MOSLEY,J (NOTE) SARS-CoV-2 target nucleic acids are DETECTED.  The SARS-CoV-2  RNA is generally detectable in upper respiratory specimens during the acute phase of infection. Positive results are indicative of the presence of the identified virus, but do not rule out bacterial infection or co-infection with other pathogens not detected by the test. Clinical correlation with patient history and other diagnostic information is necessary to determine patient infection status. The expected result is Negative.  Fact Sheet for Patients: EntrepreneurPulse.com.au  Fact Sheet for Healthcare Providers: IncredibleEmployment.be  This test is not yet approved or cleared by the [place] and  has been authorized for  detection and/or diagnosis of SARS-CoV-2 by FDA under an Emergency Use Authorization (EUA).  This EUA will remain in effect (meaning this test  can be used) for the duration of  the COVID-19 declaration under Section 564(b)(1) of the Act, 21 U.S.C. section 360bbb-3(b)(1), unless the authorization is terminated or revoked sooner.     Influenza A by PCR NEGATIVE NEGATIVE Final   Influenza B by PCR NEGATIVE NEGATIVE Final    Comment: (NOTE) The Xpert Xpress SARS-CoV-2/FLU/RSV plus assay is intended as an aid in the diagnosis of influenza from Nasopharyngeal swab specimens and should not be used as a sole basis for treatment. Nasal washings and aspirates are unacceptable for Xpert Xpress SARS-CoV-2/FLU/RSV testing.  Fact Sheet for Patients: EntrepreneurPulse.com.au  Fact Sheet for Healthcare Providers: IncredibleEmployment.be  This test is not yet approved or cleared by the [place] FDA and has been authorized for detection and/or diagnosis of SARS-CoV-2 by FDA under an Emergency Use Authorization (EUA). This EUA will remain in effect (meaning this test can be used) for the duration of the COVID-19 declaration under Section 564(b)(1) of the Act, 21 U.S.C. section  360bbb-3(b)(1), unless the authorization is terminated or revoked.  Performed at [facility], [place] [address] [place], [place] [address]   MRSA PCR Screening     Status: None   Collection Time: 04/06/2021  3:22 PM   Specimen: Nasal Mucosa; Nasopharyngeal  Result Value Ref Range Status   MRSA by PCR NEGATIVE NEGATIVE Final    Comment:        The GeneXpert MRSA Assay (FDA approved for NASAL specimens only), is one component of a comprehensive MRSA colonization surveillance program. It is not intended to diagnose MRSA infection nor to guide or monitor treatment for MRSA infections. Performed at [facility], [place] [address], [place], [place] [address]   Blood culture (routine x 2)     Status: None (Preliminary result)   Collection Time: 03/30/2021  6:05 PM   Specimen: BLOOD  Result Value Ref Range Status   Specimen Description   Final    BLOOD RIGHT ANTECUBITAL Performed at [place] [address], [place], [place] [address]    Special Requests   Final    BOTTLES DRAWN AEROBIC AND ANAEROBIC Blood Culture adequate volume Performed at [place] [address], [place], [place] [address]    Culture   Final    NO GROWTH < 12 HOURS Performed at [place] [address] [place], [place] [address]    Report Status PENDING  Incomplete  Blood culture (routine x 2)     Status: None (Preliminary result)   Collection Time: 03/25/2021  6:05 PM   Specimen: BLOOD RIGHT HAND  Result Value Ref Range Status   Specimen Description   Final    BLOOD RIGHT HAND Performed at [place] [address], [place], [place] [address]    Special Requests   Final    BOTTLES DRAWN AEROBIC ONLY Blood Culture adequate volume Performed at [place] [address] [place], [place] [address]    Culture   Final    NO GROWTH < 12 HOURS Performed at [place] [facility] [address] [place], [place] [address]    Report Status PENDING  Incomplete    Lab Basic Metabolic Panel: Recent Labs  Lab 04/10/2021 1230 04/10/2021 1636 04/10/2021 1805 05/03/2021 0024 05/03/21 0051 2021-05-03 0423  NA 132*  --  133* 136 145 141  K 4.1  --  4.2 3.6 3.4* 3.5  CL 101  --  100  --  99 100  CO2 20*  --  20*  --  31 27  GLUCOSE 412*  --  480*  --  219* 89  BUN 57*  --  59*  --  62* 55*  CREATININE 3.35*  --  3.61*  --  3.96* 3.97*  CALCIUM 7.7*  --  7.8*  --  6.6* 6.8*  MG  --  1.5*  --   --   --  1.5*  PHOS  --  3.3  --   --   --   --    Liver Function Tests: Recent Labs  Lab 04/14/2021 1230  AST 18  ALT 19  ALKPHOS 68  BILITOT 0.6  PROT 5.5*  ALBUMIN 2.1*   No results for input(s): LIPASE, AMYLASE in the last 168 hours. No results for input(s): AMMONIA in the last 168 hours. CBC: Recent Labs  Lab 04/11/2021 1230 04/25/2021 0024 2021-04-25 0051  WBC 4.2  --  3.5*  NEUTROABS 3.6  --   --   HGB 12.4* 10.5* 9.7*  HCT 37.1* 31.0* 29.8*  MCV 91.8  --  94.3  PLT 107*  --  105*   Cardiac Enzymes: No results for input(s): CKTOTAL, CKMB, CKMBINDEX, TROPONINI in the last 168 hours. Sepsis Labs: Recent Labs  Lab 03/28/2021 1230 04/01/2021 1640 03/21/2021 1805 04/20/2021 1928 2021/04/25 0051  PROCALCITON 49.30  --   --   --   --   WBC 4.2  --   --   --  3.5*  LATICACIDVEN 5.8* 4.2* 4.8* 5.8*  --    Lenice Llamas, MD Pulmonary and [place]

## 2021-04-21 NOTE — Progress Notes (Signed)
Provided grief support over several hours to Fernando Anthony's family who was in the waiting room.  His three sisters, Fernando Anthony and Fernando Anthony, were able to stand outside his door as he passed.  His daughter, Fernando Anthony, was able to be in the room and his wife, Fernando Anthony, and son, Fernando Anthony, were able to be present virtually.  I provided prayer and emotional support and presence.  [place], [place] Pager, [phone] 3:27 PM

## 2021-04-21 NOTE — Progress Notes (Signed)
[facility] Progress Note Patient Name: Fernando Anthony DOB: 07-Sep-1956 MRN: [id number]   Date of Service  2021/05/04  HPI/Events of Note  Hypotension - BP = 85/60 with MAP = 65. Currently on Norepinephrine IV infusion at 70 mcg/min.   eICU Interventions  Plan: 1. Vasopressin IV infusion at shock dose. 2. Phenylephrine IV infusion. Titrate to MAP >= 65. 3. ABG STAT.     Intervention Category Major Interventions: Hypotension - evaluation and management  Thailyn Khalid [place] 2021-05-04, 3:59 AM

## 2021-04-21 NOTE — Progress Notes (Signed)
[place] Progress Note Patient Name: AHMAR SIEG DOB: [birth year] MRN: [id number]   Date of Service  May 08, 2021  HPI/Events of Note  ABG on 100%/PRVC 30/TV 540/P 8 = 7.34/47.7/93.6/25.1  eICU Interventions  Continue present ventilator management.      Intervention Category Major Interventions: Respiratory failure - evaluation and management  Katrinka Herbison Eugene May 08, 2021, 4:22 AM

## 2021-04-21 NOTE — Progress Notes (Signed)
[facility] Progress Note Patient Name: Fernando Anthony DOB: 02/03/1956 MRN: [id number]   Date of Service  2021/04/30  HPI/Events of Note  Unstable critically ill patient. Nursing request for Foley catheter.   eICU Interventions       Intervention Category Major Interventions: Other:  Lysle Dingwall 04/30/21, 5:32 AM

## 2021-04-21 NOTE — Progress Notes (Signed)
Talked to wife - Con Memos to give update post code since another RN contacted her during the code around 0015.  Explained current interventions and the fact that the patient was on many drips to keep his blood pressure up and heart rate down at the time.  Stated I would call her at the end of shift for and update

## 2021-04-21 NOTE — Progress Notes (Signed)
[facility] Progress Note Patient Name: Fernando Anthony DOB: [birth year] MRN: [id number]   Date of Service  2021-05-08  HPI/Events of Note  K+ = 3.5, Mg++ = 1.5 and Creatinine = 3.97.  eICU Interventions  Will replace Mg++ and cautiously replace K+.     Intervention Category Major Interventions: Electrolyte abnormality - evaluation and management  Raynee Mccasland Eugene 2021/05/08, 5:08 AM

## 2021-04-21 NOTE — Progress Notes (Signed)
  Echocardiogram 2D Echocardiogram has been performed.  Fernando Anthony F May 09, 2021, 11:41 AM

## 2021-04-21 NOTE — Progress Notes (Signed)
[facility] for heparin Indication: r/o PE  Allergies  Allergen Reactions  . Bactrim [Sulfamethoxazole-Trimethoprim] Nausea And Vomiting  . Doxycycline Nausea And Vomiting  . Oxycodone     History of addiction   . Vicodin [Hydrocodone-Acetaminophen] Itching    Patient Measurements: Height: '6\' 2"'$  (188 cm) Weight: 77 kg (169 lb 12.1 oz) IBW/kg (Calculated) : 82.2  Heparin Dosing Weight: actual body weight  Vital Signs: Temp: 101.5 F (38.6 C) (05/24 0100) Temp Source: Oral (05/24 0100) BP: 99/65 (05/24 0111) Pulse Rate: 147 (05/24 0111)  Labs: Recent Labs    04/03/2021 1230 04/17/2021 1805 2021-05-12 0024 05-12-21 0051  HGB 12.4*  --  10.5* 9.7*  HCT 37.1*  --  31.0* 29.8*  PLT 107*  --   --  105*  HEPARINUNFRC  --   --   --  0.30  CREATININE 3.35* 3.61*  --  3.96*  TROPONINIHS  --   --   --  62*    Estimated Creatinine Clearance: 20.8 mL/min (A) (by C-G formula based on SCr of 3.96 mg/dL (H)).   Medical History: Past Medical History:  Diagnosis Date  . Back pain   . Diabetes mellitus   . Headache(784.0)    general  . Hyperlipidemia   . Hypertension   . Osteomyelitis ([place]) 11/2018   RIGHT FOOT     Assessment: 65 yo M Covid positive in ED with SOB.  Pharmacy consulted to dose heparin for r/o PE.  Hg 12.4, PLT low 107; D dimer 17.04. SCr 3.35  05/12/21 Heparin level = 0.3 heparin gtt @ 1450 units/hr (low end of goal) Hgb = 9.7 PLTC = 105K No bleeding complications noted  Goal of Therapy:  Heparin level 0.3-0.7 units/ml Monitor platelets by anticoagulation protocol: Yes   Plan:   Increase heparin gtt to 1500 units/hr to maintain therapeutic dose  Check heparin level in 8 hr to confirm therapeutic dose  Daily heparin level & CBC   Leone Haven, PharmD 05/12/21 2:15 AM

## 2021-04-21 NOTE — Plan of Care (Signed)
Discussed with Sister and Aunt plan of care for the evening, some mobility and visiting questions with some teach back displayed

## 2021-04-21 NOTE — Progress Notes (Addendum)
[place] Progress Note Patient Name: Fernando Anthony DOB: [birth year] MRN: [id number]   Date of Service  04-24-2021  HPI/Events of Note  Troponin #1 = 15 and #2 = 112 in the setting of s/p cardiac arrest and CPR. EKG reveals:  Sinus tachycardia. Rightward axis. Pulmonary disease pattern. Minimal voltage criteria for LVH, may be normal variant. Can't B-Block d/t Norepinephrine IV infusion requirement for hemodynamic support. Patient is already on a Heparin IV infusion.  eICU Interventions  Plan: 1. ASA 81 mg per tube now and Q day.  2. Continue to trend Troponin.     Intervention Category Major Interventions: Other:  Lysle Dingwall 2021/04/24, 3:48 AM

## 2021-04-21 NOTE — Procedures (Signed)
Arterial Catheter Insertion Procedure Note  Fernando Anthony  [id number]  1956-07-13  Date:04/24/2021  Time:12:55 AM    Provider Performing: Ulice Dash    Procedure: Insertion of Arterial Line [phone]) with US guidance [id number]   Indication(s) Blood pressure monitoring and/or need for frequent ABGs  Consent Unable to obtain consent due to emergent nature of procedure.  Anesthesia None   Time Out Verified patient identification, verified procedure, site/side was marked, verified correct patient position, special equipment/implants available, medications/allergies/relevant history reviewed, required imaging and test results available.   Sterile Technique Maximal sterile technique including full sterile barrier drape, hand hygiene, sterile gown, sterile gloves, mask, hair covering, sterile ultrasound probe cover (if used).   Procedure Description Area of catheter insertion was cleaned with chlorhexidine and draped in sterile fashion. With real-time ultrasound guidance an arterial catheter was placed into the right radial artery.  Appropriate arterial tracings confirmed on monitor.     Complications/Tolerance None; patient tolerated the procedure well.   EBL Minimal   Specimen(s) None

## 2021-04-21 DEATH — deceased

## 2021-05-10 ENCOUNTER — Encounter: Payer: BC Managed Care – PPO | Admitting: Physical Therapy

## 2021-05-27 ENCOUNTER — Ambulatory Visit: Payer: BC Managed Care – PPO | Admitting: Orthopedic Surgery

## 2022-09-26 IMAGING — DX DG CHEST 1V PORT
1 series · 1 of 1 positions shown · non-contrast
Comparison: January 10, 2021

CLINICAL DATA: Shortness of breath

EXAM:
PORTABLE CHEST 1 VIEW

[chest ap]
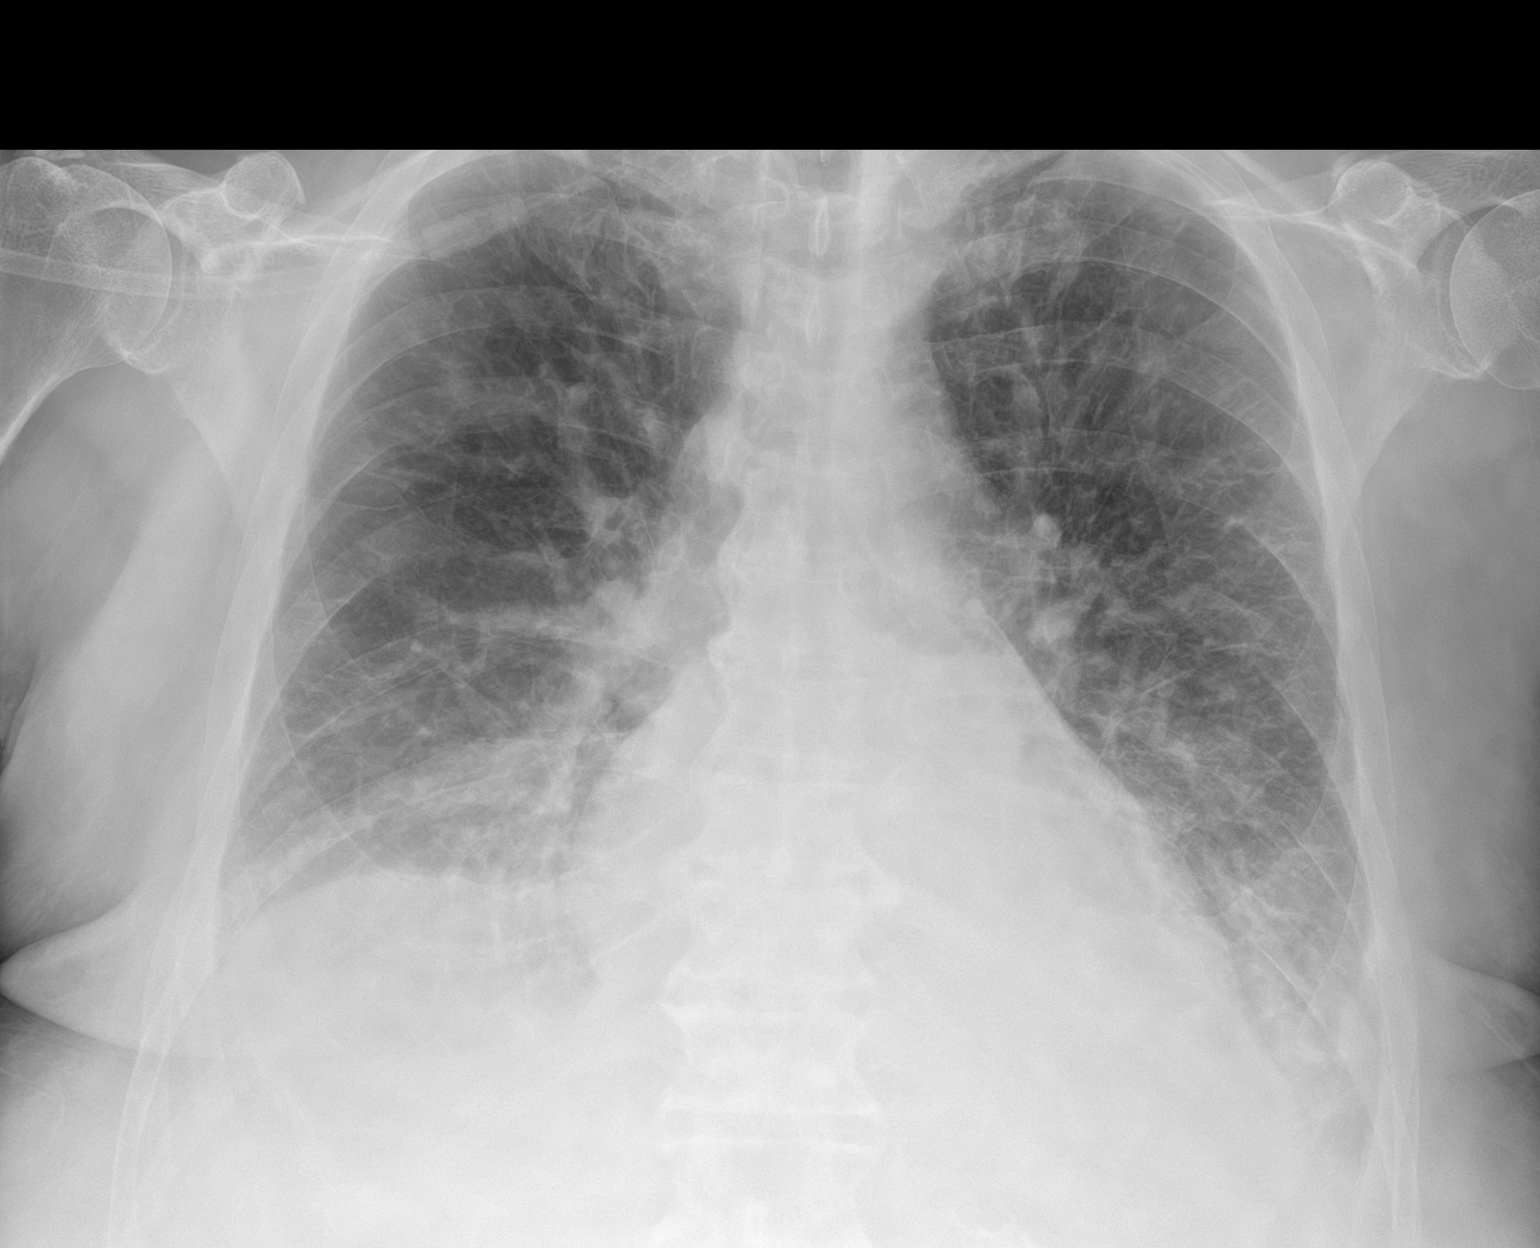

[1 of 1 positions shown; findings below may reference images not displayed]

FINDINGS: The cardiomediastinal silhouette is unchanged and mild enlarged in
contour. Likely small RIGHT pleural effusion. No pneumothorax.
Perihilar vascular prominence. Patchy bibasilar heterogeneous
airspace opacities. Diffuse interstitial prominence. Visualized
abdomen is unremarkable. Multilevel degenerative changes of the
thoracic spine.
IMPRESSION: Constellation of findings are favored to reflect pulmonary edema
with scattered atelectasis. Infection could present similarly.

## 2022-12-13 IMAGING — DX DG CHEST 1V PORT
1 series · 2 of 2 positions shown · non-contrast
Comparison: Single-view of the chest 01/24/2021. PA and lateral
chest 09/18/2013 and 01/04/2021.

CLINICAL DATA: Shortness of breath.  SIWAX-GH positive patient.

EXAM:
PORTABLE CHEST 1 VIEW

[Series 1: chest ap · 0.14mm/px · 2 of 2 slices shown]
[im 1/2]
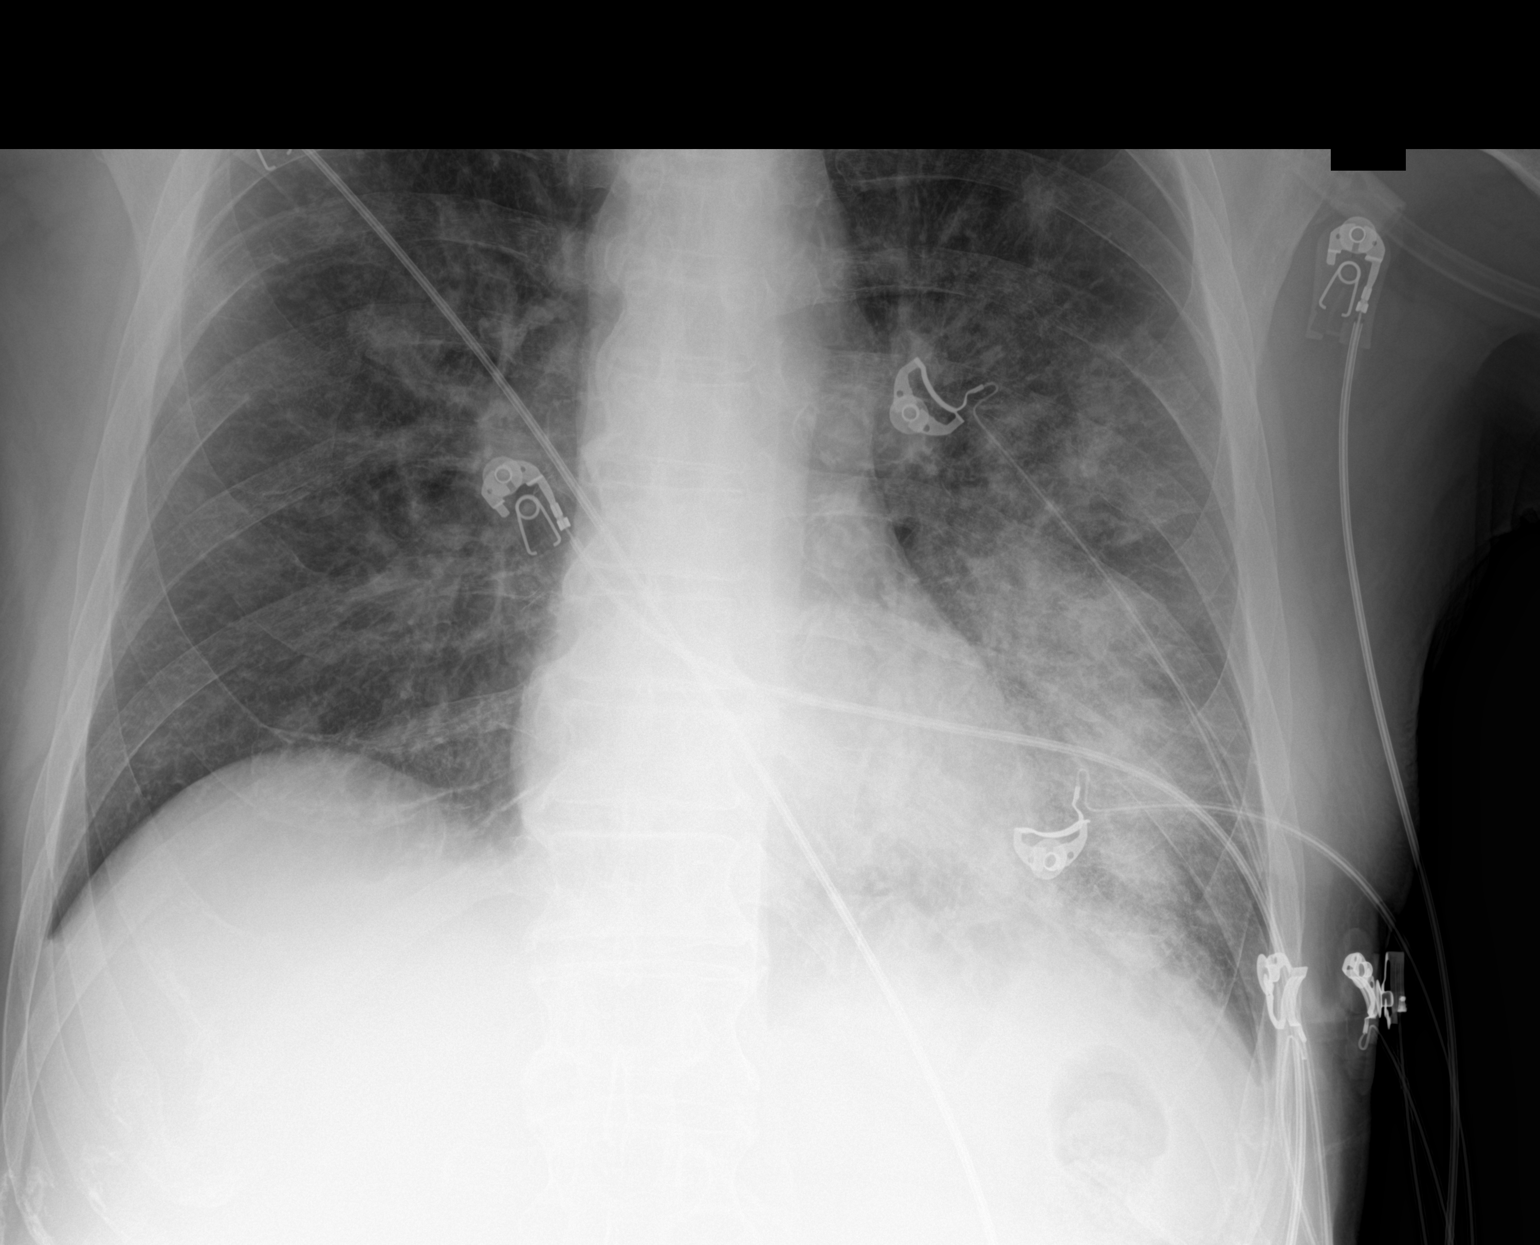
[im 2/2]
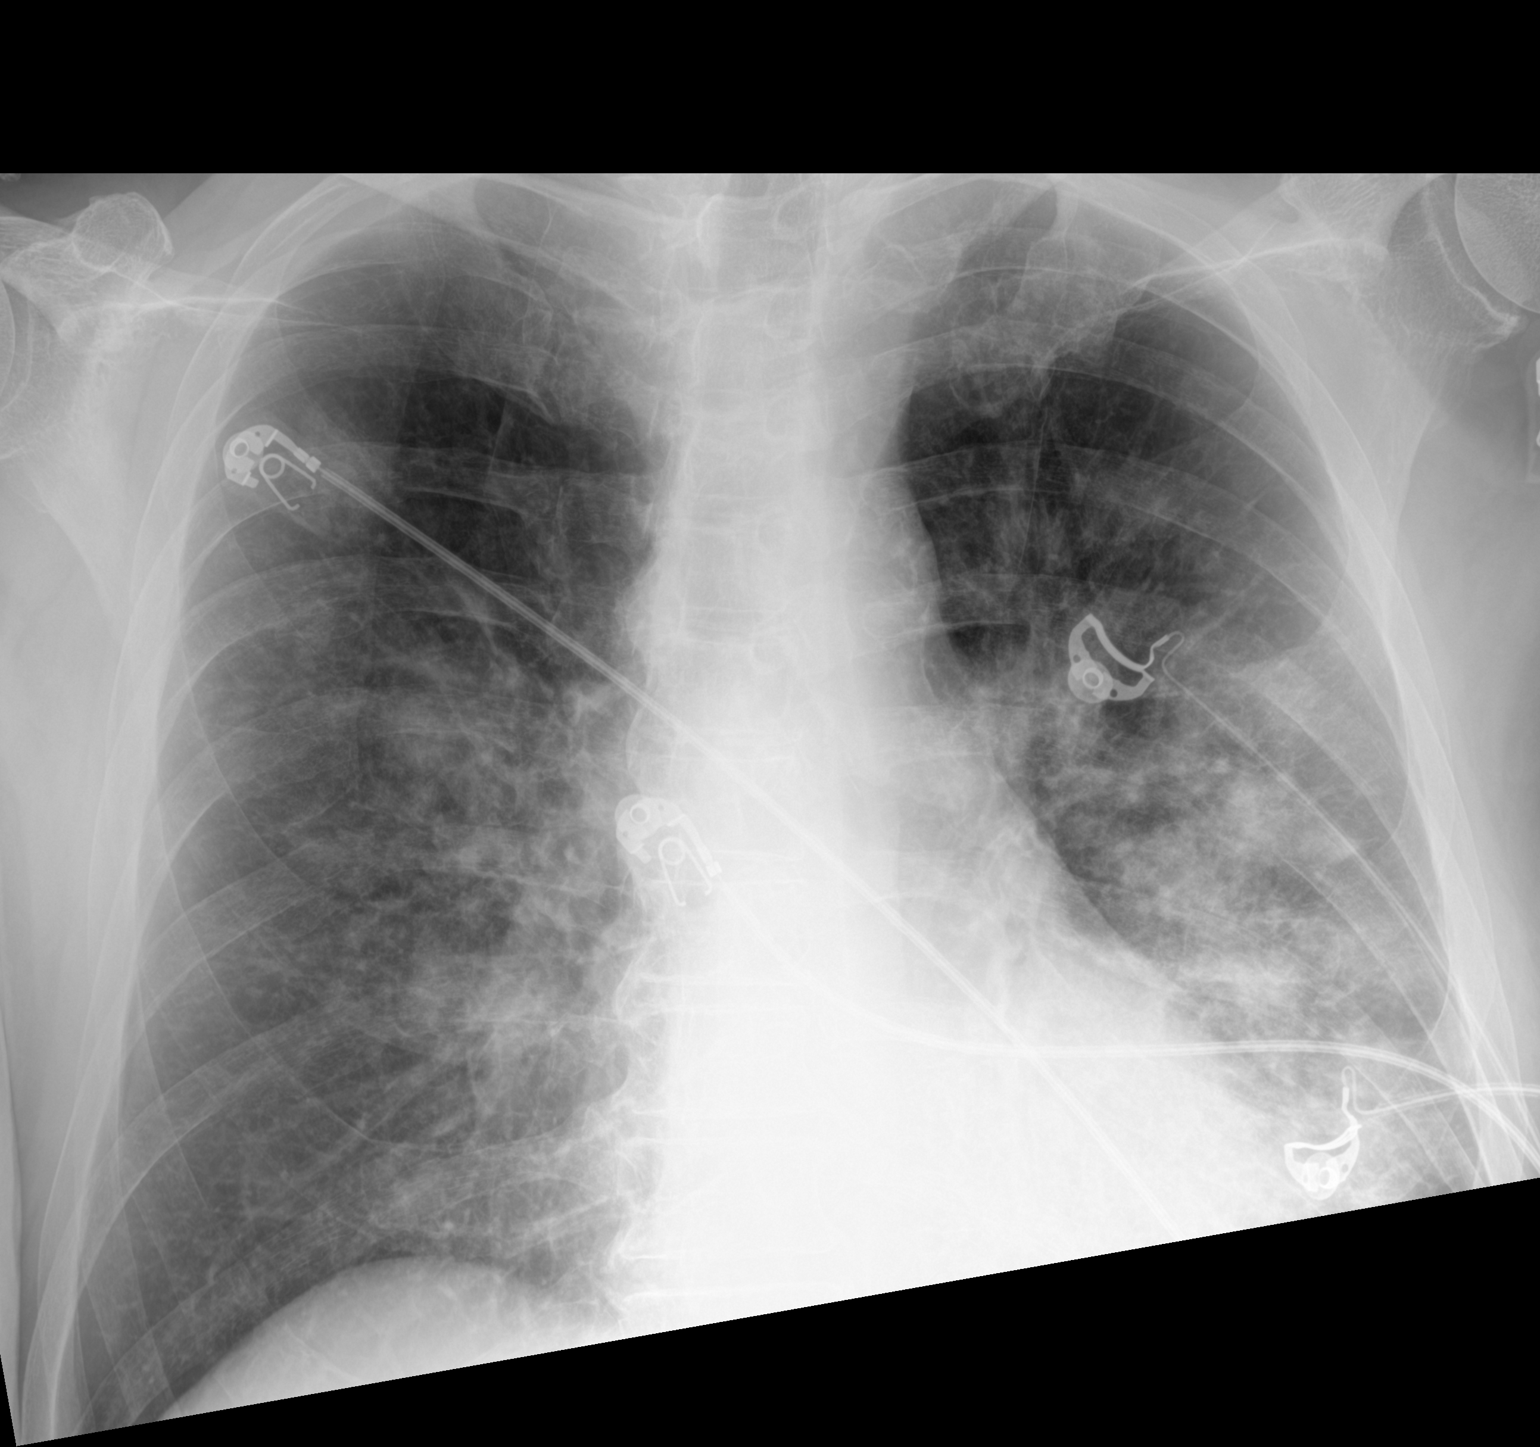

[2 of 2 positions shown; findings below may reference images not displayed]

FINDINGS: The patient has bilateral airspace disease which is worse in the
left mid and lower lung zones. Right perihilar opacity is
identified. No pneumothorax or pleural effusion. Heart size is
normal. No acute or focal bony abnormality.
IMPRESSION: Multifocal pneumonia, worse on the left.
